# Patient Record
Sex: Female | Born: 1955 | Race: White | Hispanic: No | Marital: Married | Smoking: Former smoker
Health system: Southern US, Community
[De-identification: ages and names within clinical notes are randomized; demographics above are authoritative.]

## PROBLEM LIST (undated history)

## (undated) DIAGNOSIS — F419 Anxiety disorder, unspecified: Secondary | ICD-10-CM

## (undated) DIAGNOSIS — F32A Depression, unspecified: Secondary | ICD-10-CM

## (undated) DIAGNOSIS — E785 Hyperlipidemia, unspecified: Secondary | ICD-10-CM

## (undated) DIAGNOSIS — M1990 Unspecified osteoarthritis, unspecified site: Secondary | ICD-10-CM

## (undated) DIAGNOSIS — G8929 Other chronic pain: Secondary | ICD-10-CM

## (undated) DIAGNOSIS — K449 Diaphragmatic hernia without obstruction or gangrene: Secondary | ICD-10-CM

## (undated) DIAGNOSIS — Z86010 Personal history of colonic polyps: Secondary | ICD-10-CM

## (undated) DIAGNOSIS — K219 Gastro-esophageal reflux disease without esophagitis: Secondary | ICD-10-CM

## (undated) DIAGNOSIS — M51379 Other intervertebral disc degeneration, lumbosacral region without mention of lumbar back pain or lower extremity pain: Secondary | ICD-10-CM

## (undated) DIAGNOSIS — F329 Major depressive disorder, single episode, unspecified: Secondary | ICD-10-CM

## (undated) DIAGNOSIS — G2581 Restless legs syndrome: Secondary | ICD-10-CM

## (undated) DIAGNOSIS — Z860101 Personal history of adenomatous and serrated colon polyps: Secondary | ICD-10-CM

## (undated) DIAGNOSIS — Z87898 Personal history of other specified conditions: Secondary | ICD-10-CM

## (undated) DIAGNOSIS — K439 Ventral hernia without obstruction or gangrene: Secondary | ICD-10-CM

## (undated) DIAGNOSIS — Z972 Presence of dental prosthetic device (complete) (partial): Secondary | ICD-10-CM

## (undated) DIAGNOSIS — M549 Dorsalgia, unspecified: Secondary | ICD-10-CM

## (undated) DIAGNOSIS — K429 Umbilical hernia without obstruction or gangrene: Secondary | ICD-10-CM

## (undated) DIAGNOSIS — Z8781 Personal history of (healed) traumatic fracture: Secondary | ICD-10-CM

## (undated) DIAGNOSIS — M5030 Other cervical disc degeneration, unspecified cervical region: Secondary | ICD-10-CM

## (undated) DIAGNOSIS — Z8719 Personal history of other diseases of the digestive system: Secondary | ICD-10-CM

## (undated) DIAGNOSIS — M5137 Other intervertebral disc degeneration, lumbosacral region: Secondary | ICD-10-CM

## (undated) HISTORY — DX: Major depressive disorder, single episode, unspecified: F32.9

## (undated) HISTORY — DX: Gastro-esophageal reflux disease without esophagitis: K21.9

## (undated) HISTORY — DX: Hyperlipidemia, unspecified: E78.5

## (undated) HISTORY — DX: Anxiety disorder, unspecified: F41.9

## (undated) HISTORY — PX: TONSILLECTOMY AND ADENOIDECTOMY: SUR1326

## (undated) HISTORY — DX: Depression, unspecified: F32.A

## (undated) HISTORY — PX: TUBAL LIGATION: SHX77

## (undated) HISTORY — PX: CATARACT EXTRACTION W/ INTRAOCULAR LENS  IMPLANT, BILATERAL: SHX1307

---

## 2001-08-11 ENCOUNTER — Ambulatory Visit: Admission: RE | Admit: 2001-08-11 | Discharge: 2001-08-11 | Payer: Self-pay | Admitting: General Surgery

## 2001-08-11 ENCOUNTER — Encounter: Payer: Self-pay | Admitting: General Surgery

## 2004-10-02 ENCOUNTER — Ambulatory Visit: Payer: Self-pay | Admitting: Family Medicine

## 2004-10-23 ENCOUNTER — Ambulatory Visit: Payer: Self-pay | Admitting: Family Medicine

## 2005-01-20 ENCOUNTER — Ambulatory Visit: Payer: Self-pay | Admitting: Family Medicine

## 2005-02-09 ENCOUNTER — Ambulatory Visit (HOSPITAL_COMMUNITY): Admission: RE | Admit: 2005-02-09 | Discharge: 2005-02-09 | Payer: Self-pay | Admitting: Orthopedic Surgery

## 2005-02-09 ENCOUNTER — Ambulatory Visit (HOSPITAL_BASED_OUTPATIENT_CLINIC_OR_DEPARTMENT_OTHER): Admission: RE | Admit: 2005-02-09 | Discharge: 2005-02-09 | Payer: Self-pay | Admitting: Orthopedic Surgery

## 2005-02-09 HISTORY — PX: OTHER SURGICAL HISTORY: SHX169

## 2005-02-20 ENCOUNTER — Ambulatory Visit: Payer: Self-pay | Admitting: Family Medicine

## 2005-03-30 ENCOUNTER — Ambulatory Visit: Payer: Self-pay | Admitting: Family Medicine

## 2005-05-01 ENCOUNTER — Ambulatory Visit: Payer: Self-pay | Admitting: Family Medicine

## 2008-04-24 ENCOUNTER — Ambulatory Visit: Payer: Self-pay | Admitting: Gastroenterology

## 2008-05-08 ENCOUNTER — Ambulatory Visit: Payer: Self-pay | Admitting: Gastroenterology

## 2008-05-08 ENCOUNTER — Encounter: Payer: Self-pay | Admitting: Gastroenterology

## 2008-05-08 HISTORY — PX: COLONOSCOPY: SHX174

## 2008-05-09 ENCOUNTER — Encounter: Payer: Self-pay | Admitting: Gastroenterology

## 2009-11-16 HISTORY — PX: CARPAL TUNNEL RELEASE: SHX101

## 2010-05-05 ENCOUNTER — Telehealth (INDEPENDENT_AMBULATORY_CARE_PROVIDER_SITE_OTHER): Payer: Self-pay | Admitting: *Deleted

## 2010-05-06 ENCOUNTER — Ambulatory Visit: Payer: Self-pay | Admitting: Internal Medicine

## 2010-05-06 ENCOUNTER — Encounter: Payer: Self-pay | Admitting: Internal Medicine

## 2010-05-06 ENCOUNTER — Encounter (HOSPITAL_COMMUNITY): Admission: RE | Admit: 2010-05-06 | Discharge: 2010-07-23 | Payer: Self-pay | Admitting: Family Medicine

## 2010-05-06 ENCOUNTER — Ambulatory Visit: Payer: Self-pay

## 2010-05-06 HISTORY — PX: CARDIOVASCULAR STRESS TEST: SHX262

## 2010-11-16 HISTORY — PX: LAPAROSCOPIC CHOLECYSTECTOMY: SUR755

## 2010-12-16 NOTE — Progress Notes (Signed)
Summary: Nuclear Pre-Procedure  Phone Note Outgoing Call Call back at [facility] Phone [phone]   Call placed by: Eliezer Lofts, EMT-P,  May 05, 2010 4:14 PM Call placed to: Patient Action Taken: Phone Call Completed Summary of Call: Reviewed information on Myoview Information Sheet (see scanned document for further details).  Spoke with Patient.    Nuclear Med Background Indications for Stress Test: Evaluation for Ischemia, Surgical Clearance        Nuclear Pre-Procedure Cardiac Risk Factors: History of Smoking

## 2010-12-16 NOTE — Miscellaneous (Signed)
Summary: gi previsit  Clinical Lists Changes  Medications: Added new medication of MOVIPREP 100 GM  SOLR (PEG-KCL-NACL-NASULF-NA ASC-C) As per prep instructions. - Signed Rx of MOVIPREP 100 GM  SOLR (PEG-KCL-NACL-NASULF-NA ASC-C) As per prep instructions.;  #1 x 0;  Signed;  Entered by: Trilby Leaver RN;  Authorized by: Ladene Artist MD [facility];  Method used: Electronic Observations: Added new observation of ALLERGY REV: Done (04/24/2008 10:03) Added new observation of NKA: T (04/24/2008 10:03)    Prescriptions: MOVIPREP 100 GM  SOLR (PEG-KCL-NACL-NASULF-NA ASC-C) As per prep instructions.  #1 x 0   Entered by:   Trilby Leaver RN   Authorized by:   Ladene Artist MD [facility]   Signed by:   Trilby Leaver RN on 04/24/2008   Method used:   Electronically sent to ...       [facility]  [address] [place], [place]  [address]       Ph: [phone]       Fax: [phone]   RxID:   [id number]

## 2010-12-16 NOTE — Procedures (Signed)
Summary: Colonoscopy   Colonoscopy  Procedure date:  05/08/2008  Findings:      Location:  [place].    Procedures Next Due Date:    Colonoscopy: 05/2013  Patient Name: Lori Cooper, Lori Cooper MRN:  Procedure Procedures: Colonoscopy CPT: H7044205.    with polypectomy. CPT: S2983155.  Personnel: Endoscopist: Pricilla Riffle. Fuller Plan, MD, Marval Regal.  Referred By: Haynes Hoehn, PA.  Exam Location: Exam performed in Outpatient Clinic. Outpatient  Patient Consent: Procedure, Alternatives, Risks and Benefits discussed, consent obtained, from patient. Consent was obtained by the RN.  Indications  Average Risk Screening Routine.  History  Current Medications: Patient is not currently taking Coumadin.  Pre-Exam Physical: Performed May 08, 2008. Cardio-pulmonary exam, Rectal exam, HEENT exam , Abdominal exam, Mental status exam WNL.  Comments: Pt. history reviewed/updated, physical exam performed prior to initiation of sedation?Yes Exam Exam: Extent of exam reached: Cecum, extent intended: Cecum.  The cecum was identified by appendiceal orifice and IC valve. Time to Cecum: 00:03: 23. Time for Withdrawl: 00:12:06. Colon retroflexion performed. ASA Classification: II. Tolerance: excellent.  Monitoring: Pulse and BP monitoring, Oximetry used. Supplemental O2 given.  Colon Prep Used MoviPrep for colon prep. Prep results: excellent.  Sedation Meds: Patient assessed and found to be appropriate for moderate (conscious) sedation. Fentanyl 75 mcg. given IV. Versed 8 mg. given IV.  Findings POLYP: Descending Colon, Maximum size: 4 mm. sessile polyp. Procedure:  snare without cautery, removed, retrieved, Polyp sent to pathology. ICD9: Colon Polyps: 211.3.  NORMAL EXAM: Cecum to Splenic Flexure.  NORMAL EXAM: Sigmoid Colon.  HEMORRHOIDS: Internal. Size: Small. Not bleeding. Not thrombosed. ICD9: Hemorrhoids, Internal: 455.0.   Assessment  Diagnoses: 211.3: Colon Polyps.  455.0:  Hemorrhoids, Internal.   Events  Unplanned Interventions: No intervention was required.  Unplanned Events: There were no complications. Plans  Post Exam Instructions: Post sedation instructions given.  Medication Plan: Await pathology.  Patient Education: Patient given standard instructions for: Polyps. Hemorrhoids.  Disposition: After procedure patient sent to recovery. After recovery patient sent home.  Scheduling/Referral: Colonoscopy, to [facility] T. Fuller Plan, MD, [facility], if polyp is adenomatous, around May 08, 2013.  Colonoscopy, to [facility] T. Fuller Plan, MD, [facility], if polyp is not neoplastic, around May 08, 2018.     cc.   Chase Picket    REPORT OF SURGICAL PATHOLOGY   Case #: [id number] Patient Name: Lori Cooper, Lori Cooper. Office Chart Number:  [id number]   MRN: [id number] Pathologist: Jaquelyn Bitter A. Rodney Cruise, MD DOB/Age  07/24/1956 (Age: 55)    Gender: F Date Taken:  05/08/2008 Date Received: 05/08/2008   FINAL DIAGNOSIS   ***MICROSCOPIC EXAMINATION AND DIAGNOSIS***   DESCENDING COLON, POLYP:  TUBULAR ADENOMA.  NO HIGH GRADE DYSPLASIA OR MALIGNANCY IDENTIFIED.   mj Date Reported:  05/09/2008     Louanne Belton. Rodney Cruise, MD *** Electronically Signed Out By EAA ***   Clinical information Screening R/O adenoma (jes)    specimen(s) obtained Colon, polyp(s), descending   Gross Description Received in formalin is a tan, soft tissue fragment that is submitted in toto.  Size:  0.4 cm One block  (ML:jes,05/09/08)    jes/     Signed by Ladene Artist MD FACG on 05/09/2008 at 3:45 PM  ________________________________________________________________________ colon 04/2013   Signed by Ladene Artist MD FACG on 05/09/2008 at 3:45 PM      May 09, 2008 MRN: [id number]    [facility] [place], [place]  [id number]    Dear Lori Cooper,  I am  pleased to inform you that the colon polyp(s) removed during your recent colonoscopy was (were) found to be benign (no  cancer detected) upon pathologic examination.  I recommend you have a repeat colonoscopy examination in 5 years to look for recurrent polyps, as having colon polyps increases your risk for having recurrent polyps or even colon cancer in the future.  Should you develop new or worsening symptoms of abdominal pain, bowel habit changes or bleeding from the rectum or bowels, please schedule an evaluation with either your primary care physician or with me.  Continue treatment plan as outlined the day of your exam.  Please call us if you are having persistent problems or have questions about your condition that have not been fully answered at this time.  Sincerely,  Ladene Artist MD [facility]  This letter has been electronically signed by your physician.   Signed by Ladene Artist MD FACG on 05/09/2008 at 3:44 PM    This report was created from the original endoscopy report, which was reviewed and signed by the above listed endoscopist.

## 2010-12-16 NOTE — Assessment & Plan Note (Signed)
Summary: Cardiology Nuclear Study  Nuclear Med Background Indications for Stress Test: Evaluation for Ischemia, Surgical Clearance  Indications Comments: Pending Gall Bladder surgery   History Comments: NO DOCUMENTED CAD.  PATIENT IN [facility] ED, 12/10 WITH CP, NO RECORDS.  Symptoms: Chest Pain, Diaphoresis, Fatigue  Symptoms Comments: Last episode of CP:1 month ago.   Nuclear Pre-Procedure Cardiac Risk Factors: Lipids, Obesity, Smoker Caffeine/Decaff Intake: none NPO After: 8:00 PM Lungs: Clear.  O2 Sat 95% on RA. IV 0.9% NS with Angio Cath: 20g     IV Site: (R) AC IV Started by: Eliezer Lofts EMT-P Chest Size (in) 40     Cup Size C     Height (in): 65 Weight (lb): 205 BMI: 34.24  Nuclear Med Study 1 or 2 day study:  1 day     Stress Test Type:  Carlton Adam Reading MD:  Glori Bickers, MD     Referring MD:  Redge Gainer, MD Resting Radionuclide:  Technetium 54m Tetrofosmin     Resting Radionuclide Dose:  11 mCi  Stress Radionuclide:  Technetium 19m Tetrofosmin     Stress Radionuclide Dose:  33 mCi   Stress Protocol   Lexiscan: 0.4 mg   Stress Test Technologist:  Valetta Fuller CMA-N     Nuclear Technologist:  Charlton Amor CNMT  Rest Procedure  Myocardial perfusion imaging was performed at rest 45 minutes following the intravenous administration of Myoview Technetium 69m Tetrofosmin.  Stress Procedure  The patient received IV Lexiscan 0.4 mg over 15-seconds.  Myoview injected at 30-seconds.  There were no significant changes with infusion.  Quantitative spect images were obtained after a 45 minute delay.  QPS Raw Data Images:  There is a breast shadow that accounts for the anterior attenuation. Stress Images:  Decreased uptake over the mid to distal anterior wall Rest Images:  Normal homogeneous uptake in all areas of the myocardium. Subtraction (SDS):  There is a large reversible defect in the mid to distal anterior wall. Based on the raw images  this seems to be shifting breast attenuation however cannot exclude ischemia Transient Ischemic Dilatation:  1.05  (Normal <1.22)  Lung/Heart Ratio:  .13  (Normal <0.45)  Quantitative Gated Spect Images QGS EDV:  106 ml QGS ESV:  35 ml QGS EF:  67 % QGS cine images:  Normal  Findings Probably normal      Overall Impression  Exercise Capacity: Baxter study with no exercise. ECG Impression: Baseline: NSR; No significant ST segment change with Lexiscan. Overall Impression: Probably normal Lexiscan Myoview. Overall Impression Comments: There is a large reversible defect in the mid to distal anterior wall. Based on the raw images this seems to be shifting breast attenuation however cannot exclude ischemia.

## 2011-04-03 NOTE — Op Note (Signed)
NAMEZAHARRA, MADRIAGA                ACCOUNT NO.:  [id number]   MEDICAL RECORD NO.:  [id number]          PATIENT TYPE:  AMB   LOCATION:  [place]                          FACILITY:  [place]   PHYSICIAN:  Kathalene Frames. Mayer Camel, M.D.   DATE OF BIRTH:  [birth year]   DATE OF PROCEDURE:  DATE OF DISCHARGE:                                 OPERATIVE REPORT   No dictation for this job number.  Dictator cancelled.      FJR/MEDQ  D:  02/09/2005  T:  02/09/2005  Job:  [id number]

## 2011-04-07 NOTE — Op Note (Signed)
NAMEMACKINZE, KOPEC                ACCOUNT NO.:  [id number]   MEDICAL RECORD NO.:  [id number]          PATIENT TYPE:  AMB   LOCATION:  [place]                          FACILITY:  [place]   PHYSICIAN:  Kathalene Frames. Mayer Camel, M.D.   DATE OF BIRTH:  Aug 07, 1956   DATE OF PROCEDURE:  02/09/2005  DATE OF DISCHARGE:                                 OPERATIVE REPORT   PREOPERATIVE DIAGNOSIS:  Left shoulder impingement syndrome, AC joint  arthritis.   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   PROCEDURE:  Left shoulder arthroscopic anterior inferior acromioplasty,  distal clavicle excision, complete, and debridement of superior labral tear,  degenerative.   SURGEON:  Kathalene Frames. Mayer Camel, M.D.   FIRST ASSISTANT:  None.   ANESTHETIC:  Interscalene block, left and general endotracheal.   ESTIMATED BLOOD LOSS:  Minimal.   FLUID REPLACEMENT:  Liter crystalloid.   DRAINS PLACED:  None.   TOURNIQUET TIME:  None.   INDICATIONS FOR PROCEDURE:  55 year old woman who slipped and fell, I  believe back in August of 2005, was treated appropriately and conservatively  for grade one to two [facility] separation by Dr. Rip Harbour with anti-inflammatory  medicines, physical therapy good temporary relief from cortisone shot. She  also has some impingement, and unfortunately, these maneuvers have failed.  She does have a very sharp type 2 to 3 subacromial spur 1 cm in length and  because of this, is taken for arthroscopic evaluation and treatment of her  left shoulder. Risks, benefits of surgery discussed at length with the  patient.   DESCRIPTION OF PROCEDURE:  The patient identified by armband, taken the  operating room, at [facility] day surgery center. Appropriate site monitors were  attached and interscalene block anesthesia induced to the left upper  extremity followed by general endotracheal anesthesia. She was placed in the  beach chair position, left  upper extremity prepped and draped in the sterile  fashion from the wrist to the hemithorax and using a #11 blade, standard  portals were then made 1.5 cm anterior to the [facility] joint, lateral to the  junction of the middle and posterior thirds acromion, posterior to the  posterolateral corner of the acromion process. The inflow was placed  anteriorly. The arthroscope laterally and a 4.2 great white sucker shaver  posteriorly allowing removal of the subacromial bursa as an assistant  applied downward traction on the elbow.  This outlined the subacromial spur  which was quite impressive and then removed with a 4.5 hooded vortex bur  exposing the [facility] joint which was down to bare bone arthritis. We then set  about removing the distal clavicle, removing the inferior centimeter  bringing the scope in laterally and the bur in posteriorly and then swapping  portals with the scope posterior, the inflow lateral and the bur anterior,  completing the distal clavicle excision.  Satisfied with this, the  arthroscope was then repositioned into the glenohumeral joint which had a  fairly normal appearance except for some degenerative tearing of the  superior labrum which was  removed using the anterior portal and the 4.2  great white sucker shaver. After this had been accomplished, the shoulder  was thoroughly irrigated out with normal saline solution. The arthroscopic  consents removed and a dressing of Xeroform, 4x4 dressing sponges, ABD and  paper tape and a sling applied. The patient was laid supine, awakened and  taken to the recovery room without difficulty.      FJR/MEDQ  D:  02/09/2005  T:  02/09/2005  Job:  [id number]

## 2012-12-15 ENCOUNTER — Other Ambulatory Visit: Payer: Self-pay | Admitting: Sports Medicine

## 2012-12-15 DIAGNOSIS — M25512 Pain in left shoulder: Secondary | ICD-10-CM

## 2012-12-16 ENCOUNTER — Other Ambulatory Visit: Payer: Self-pay | Admitting: Sports Medicine

## 2012-12-16 ENCOUNTER — Ambulatory Visit
Admission: RE | Admit: 2012-12-16 | Discharge: 2012-12-16 | Disposition: A | Discharge status: Home / Self Care | Payer: BC Managed Care – PPO | Source: Ambulatory Visit | Attending: Sports Medicine | Admitting: Sports Medicine

## 2012-12-16 DIAGNOSIS — M25512 Pain in left shoulder: Secondary | ICD-10-CM

## 2013-01-31 ENCOUNTER — Ambulatory Visit: Payer: BC Managed Care – PPO | Admitting: Physical Therapy

## 2013-02-06 ENCOUNTER — Ambulatory Visit: Payer: BC Managed Care – PPO | Attending: Orthopedic Surgery | Admitting: Physical Therapy

## 2013-02-06 DIAGNOSIS — R5381 Other malaise: Secondary | ICD-10-CM | POA: Insufficient documentation

## 2013-02-06 DIAGNOSIS — M25619 Stiffness of unspecified shoulder, not elsewhere classified: Secondary | ICD-10-CM | POA: Insufficient documentation

## 2013-02-06 DIAGNOSIS — IMO0001 Reserved for inherently not codable concepts without codable children: Secondary | ICD-10-CM | POA: Insufficient documentation

## 2013-02-06 DIAGNOSIS — M25519 Pain in unspecified shoulder: Secondary | ICD-10-CM | POA: Insufficient documentation

## 2013-02-09 ENCOUNTER — Ambulatory Visit: Payer: BC Managed Care – PPO | Admitting: Physical Therapy

## 2013-02-10 ENCOUNTER — Other Ambulatory Visit (INDEPENDENT_AMBULATORY_CARE_PROVIDER_SITE_OTHER): Payer: BC Managed Care – PPO

## 2013-02-10 DIAGNOSIS — E559 Vitamin D deficiency, unspecified: Secondary | ICD-10-CM

## 2013-02-10 DIAGNOSIS — I10 Essential (primary) hypertension: Secondary | ICD-10-CM

## 2013-02-10 DIAGNOSIS — E785 Hyperlipidemia, unspecified: Secondary | ICD-10-CM

## 2013-02-10 LAB — BASIC METABOLIC PANEL
BUN: 9 mg/dL (ref 6–23)
CO2: 22 mEq/L (ref 19–32)
Calcium: 9.7 mg/dL (ref 8.4–10.5)
Chloride: 104 mEq/L (ref 96–112)
Creat: 0.7 mg/dL (ref 0.50–1.10)
Glucose, Bld: 92 mg/dL (ref 70–99)
Potassium: 4.5 mEq/L (ref 3.5–5.3)
Sodium: 139 mEq/L (ref 135–145)

## 2013-02-10 LAB — HEPATIC FUNCTION PANEL
ALT: 16 U/L (ref 0–35)
AST: 19 U/L (ref 0–37)
Albumin: 4.2 g/dL (ref 3.5–5.2)
Alkaline Phosphatase: 76 U/L (ref 39–117)
Bilirubin, Direct: <0.1 mg/dL (ref 0.0–0.3)
Total Bilirubin: 0.4 mg/dL (ref 0.3–1.2)
Total Protein: 6.3 g/dL (ref 6.0–8.3)

## 2013-02-10 NOTE — Progress Notes (Signed)
Patient came in for labs only.

## 2013-02-11 LAB — VITAMIN D 25 HYDROXY (VIT D DEFICIENCY, FRACTURES): Vit D, 25-Hydroxy: 24 ng/mL — ABNORMAL LOW (ref 30–89)

## 2013-02-13 ENCOUNTER — Ambulatory Visit: Payer: BC Managed Care – PPO | Admitting: Physical Therapy

## 2013-02-14 LAB — NMR LIPOPROFILE WITH LIPIDS
Cholesterol, Total: 278 mg/dL — ABNORMAL HIGH (ref <200)
HDL Particle Number: 40.5 umol/L (ref >=30.5)
HDL Size: 8.5 nm — ABNORMAL LOW (ref >=9.2)
HDL-C: 54 mg/dL (ref >=40)
LDL (calc): 197 mg/dL — ABNORMAL HIGH (ref <100)
LDL Particle Number: 2858 nmol/L — ABNORMAL HIGH (ref <1000)
LDL Size: 21.2 nm (ref >20.5)
LP-IR Score: 52 — ABNORMAL HIGH (ref <=45)
Large HDL-P: 2.4 umol/L — ABNORMAL LOW (ref >=4.8)
Large VLDL-P: <0.8 nmol/L (ref <=2.7)
Small LDL Particle Number: 1259 nmol/L — ABNORMAL HIGH (ref <=527)
Triglycerides: 134 mg/dL (ref <150)
VLDL Size: 46.1 nm (ref >=46.6)

## 2013-02-14 NOTE — Progress Notes (Signed)
Quick Note:  Labs abnormal. Vitamin D is low. LDL particles very high cholesterol abnormal. Needs to be seen to discuss lab results, appointment to be made if not already made.  ______

## 2013-02-15 ENCOUNTER — Ambulatory Visit: Payer: BC Managed Care – PPO | Attending: Orthopedic Surgery | Admitting: Physical Therapy

## 2013-02-15 DIAGNOSIS — R5381 Other malaise: Secondary | ICD-10-CM | POA: Insufficient documentation

## 2013-02-15 DIAGNOSIS — M25519 Pain in unspecified shoulder: Secondary | ICD-10-CM | POA: Insufficient documentation

## 2013-02-15 DIAGNOSIS — IMO0001 Reserved for inherently not codable concepts without codable children: Secondary | ICD-10-CM | POA: Insufficient documentation

## 2013-02-15 DIAGNOSIS — M25619 Stiffness of unspecified shoulder, not elsewhere classified: Secondary | ICD-10-CM | POA: Insufficient documentation

## 2013-02-17 ENCOUNTER — Ambulatory Visit: Payer: BC Managed Care – PPO | Admitting: Physical Therapy

## 2013-02-20 ENCOUNTER — Ambulatory Visit: Payer: BC Managed Care – PPO | Admitting: Physical Therapy

## 2013-02-20 ENCOUNTER — Encounter: Payer: Self-pay | Admitting: Family Medicine

## 2013-02-20 ENCOUNTER — Ambulatory Visit (INDEPENDENT_AMBULATORY_CARE_PROVIDER_SITE_OTHER): Payer: BC Managed Care – PPO | Admitting: Family Medicine

## 2013-02-20 VITALS — BP 154/76 | HR 73 | Temp 97.3°F | Ht 65.0 in | Wt 208.0 lb

## 2013-02-20 DIAGNOSIS — S4290XA Fracture of unspecified shoulder girdle, part unspecified, initial encounter for closed fracture: Secondary | ICD-10-CM | POA: Insufficient documentation

## 2013-02-20 DIAGNOSIS — IMO0001 Reserved for inherently not codable concepts without codable children: Secondary | ICD-10-CM | POA: Insufficient documentation

## 2013-02-20 DIAGNOSIS — G2581 Restless legs syndrome: Secondary | ICD-10-CM

## 2013-02-20 DIAGNOSIS — K219 Gastro-esophageal reflux disease without esophagitis: Secondary | ICD-10-CM

## 2013-02-20 DIAGNOSIS — S4292XS Fracture of left shoulder girdle, part unspecified, sequela: Secondary | ICD-10-CM

## 2013-02-20 DIAGNOSIS — S42309S Unspecified fracture of shaft of humerus, unspecified arm, sequela: Secondary | ICD-10-CM

## 2013-02-20 DIAGNOSIS — R030 Elevated blood-pressure reading, without diagnosis of hypertension: Secondary | ICD-10-CM

## 2013-02-20 DIAGNOSIS — E785 Hyperlipidemia, unspecified: Secondary | ICD-10-CM | POA: Insufficient documentation

## 2013-02-20 DIAGNOSIS — F411 Generalized anxiety disorder: Secondary | ICD-10-CM

## 2013-02-20 LAB — FERRITIN: Ferritin: 27 ng/mL (ref 10–291)

## 2013-02-20 MED ORDER — CHOLECALCIFEROL 50 MCG (2000 UT) PO TABS: 6000.0000 [IU] | ORAL_TABLET | Freq: Every day | ORAL | Status: DC

## 2013-02-20 MED ORDER — FENOFIBRATE 54 MG PO TABS: 54.0000 mg | ORAL_TABLET | Freq: Every day | ORAL | Status: DC

## 2013-02-20 NOTE — Assessment & Plan Note (Signed)
This is new and needs to be  Evaluated with a ferritin level.

## 2013-02-20 NOTE — Assessment & Plan Note (Signed)
On prozac.   ?

## 2013-02-20 NOTE — Assessment & Plan Note (Signed)
stable °

## 2013-02-20 NOTE — Progress Notes (Signed)
Patient ID: MILDRETH SCHENCK, female   DOB: 07/15/1956, 57 y.o.   MRN: [id number] SUBJECTIVE:  HPI: Patient is here for follow up of hyperlipidemia: deniesHeadache;deniesChest Pain;deniesweakness;deniesShortness of Breath and orthopnea;deniesVisual changes;deniespalpitations;deniescough;deniespedal edema;deniessymptoms of TIA or stroke;deniesClaudication symptoms. admits toCompliance with medications; problems with medications.: statins causes too much GI upset  In the interim since being seen, patient sustained an injury to her shoulder with resultant fracture of the shoulder. This is being treated by the orthopedist.  Also, experiencing restless legs for which she was prescribed meds , flexeril.  GERd stable.  Anxiety: unchanged.   PMH/PSH: reviewed/updated in Epic  SH/FH: reviewed/updated in Epic  Allergies: reviewed/updated in Epic  Medications: reviewed/updated in Epic  Immunizations: reviewed/updated in Epic   ROS: As above in the HPI. All other systems are stable or negative.   OBJECTIVE:  APPEARANCE:   Obese WF, NAD Patient in no acute distress.The patient appeared well nourished and normally developed. Acyanotic.  Waist:  VITAL SIGNS:BP 154/76  Pulse 73  Temp(Src) 97.3 F (36.3 C) (Oral)  Ht 5\' 5"  (1.651 m)  Wt 208 lb (94.348 kg)  BMI 34.61 kg/m2   SKIN: warm and  Dry without overt rashes, tattoos and scars  HEAD and Neck: without JVD, Normal No scleral icterus  CHEST & LUNGS: Clear  CVS: Reveals the PMI to be normally located. Regular rhythm, First and Second Heart sounds are normal, and absence of murmurs, rubs or gallops.  ABDOMEN:  Benign,, no organomegaly, no masses, no Abdominal Aortic enlargement. No Guarding , no rebound. No Bruits.  RECTAL:n/a  GU:n/a  EXTREMETIES: nonedematous. Both Femoral and Pedal pulses are normal. Left shoulder has , decreased ROM and patient protects.  MUSCULOSKELETAL:  Spine: normal  NEUROLOGIC: oriented  to time,place and person; nonfocal. Strength is normal Sensory is normal Reflexes are normal Cranial Nerves are normal.   ASSESSMENT: Elevated blood pressure Monitor bp daily and bring results in 2 months  Restless legs This is new and needs to be  Evaluated with a ferritin level.  Generalized anxiety disorder On prozac.  GERD (gastroesophageal reflux disease) stable  HLD (hyperlipidemia) Patient reluctant to be on statinsdue to adverse events of GI upset with statins in the past. Will use fenfibrate with Zetia.  Fracture, shoulder prsently in PT and under Ortho supervision.   Vitamin D Deficiency: need to increase the po intake of Vit D supplement.   PLAN:    Diet and Exercise discussed with patient. For nutrition information, I recommend books: Eat to Live by Dr Excell Seltzer. Prevent and Reverse Heart Disease by Dr Karl Luke.  Exercise recommendations are:  If unable to walk, then the patient can exercise in a chair 3 times a day. By flapping arms like a bird gently and raising legs outwards to the front.  If ambulatory, the patient can go for walks for 30 minutes 3 times a week. Then increase the intensity and duration as tolerated. Goal is to try to attain exercise frequency to 5 times a week. Best to perform resistance exercises 2 days a week and cardio type exercises 3 days per week.  Orders Placed This Encounter  Procedures  . Ferritin   Reviewed the labs performed recently.  Discussed treatment and intervention with patient. Added fenfibrate to her regimen and increased the Vit D.                Meds ordered this encounter  Medications  . cyclobenzaprine (FLEXERIL) 10 MG tablet    Sig:   .  ZETIA 10 MG tablet    Sig:   . FLUoxetine (PROZAC) 40 MG capsule    Sig: 80 mg daily.   Marland Kitchen omeprazole (PRILOSEC) 20 MG capsule    Sig:   . LORazepam (ATIVAN) 0.5 MG tablet    Sig: Take 0.5 mg by mouth every 8 (eight) hours as needed for anxiety.  Marland Kitchen  HYDROcodone-acetaminophen (NORCO) 10-325 MG per tablet    Sig: Take 1 tablet by mouth every 8 (eight) hours as needed for pain.  Marland Kitchen DISCONTD: Cholecalciferol (VITAMIN D) 2000 UNITS CAPS    Sig: Take 4,000 Units by mouth every morning.  . fenofibrate 54 MG tablet    Sig: Take 1 tablet (54 mg total) by mouth daily.    Dispense:  30 tablet    Refill:  5  . Cholecalciferol 2000 UNITS TABS    Sig: Take 3 tablets (6,000 Units total) by mouth daily.    Dispense:  30 each    Refill:  11     await the ferritin. RTC x 2 months to check the Vit D level. And to follow up on response to fenofibrate. Recheck the NMR lipomed in 3 months.  Oneika Simonian P. Jacelyn Grip, M.D.

## 2013-02-20 NOTE — Assessment & Plan Note (Signed)
Monitor bp daily and bring results in 2 months

## 2013-02-20 NOTE — Assessment & Plan Note (Signed)
prsently in PT and under Ortho supervision.

## 2013-02-20 NOTE — Patient Instructions (Signed)
Diet and Exercise discussed with patient. For nutrition information, I recommend books: Eat to Live by Dr Excell Seltzer. Prevent and Reverse Heart Disease by Dr Karl Luke.  Exercise recommendations are:  If unable to walk, then the patient can exercise in a chair 3 times a day. By flapping arms like a bird gently and raising legs outwards to the front.  If ambulatory, the patient can go for walks for 30 minutes 3 times a week. Then increase the intensity and duration as tolerated. Goal is to try to attain exercise frequency to 5 times a week. Best to perform resistance exercises 2 days a week and cardio type exercises 3 days per week. Hypertriglyceridemia  Diet for High blood levels of Triglycerides Most fats in food are triglycerides. Triglycerides in your blood are stored as fat in your body. High levels of triglycerides in your blood may put you at a greater risk for heart disease and stroke.  Normal triglyceride levels are less than 150 mg/dL. Borderline high levels are 150-199 mg/dl. High levels are 200 - 499 mg/dL, and very high triglyceride levels are greater than 500 mg/dL. The decision to treat high triglycerides is generally based on the level. For people with borderline or high triglyceride levels, treatment includes weight loss and exercise. Drugs are recommended for people with very high triglyceride levels. Many people who need treatment for high triglyceride levels have metabolic syndrome. This syndrome is a collection of disorders that often include: insulin resistance, high blood pressure, blood clotting problems, high cholesterol and triglycerides. TESTING PROCEDURE FOR TRIGLYCERIDES  You should not eat 4 hours before getting your triglycerides measured. The normal range of triglycerides is between 10 and 250 milligrams per deciliter (mg/dl). Some people may have extreme levels (1000 or above), but your triglyceride level may be too high if it is above 150 mg/dl, depending  on what other risk factors you have for heart disease.  People with high blood triglycerides may also have high blood cholesterol levels. If you have high blood cholesterol as well as high blood triglycerides, your risk for heart disease is probably greater than if you only had high triglycerides. High blood cholesterol is one of the main risk factors for heart disease. CHANGING YOUR DIET  Your weight can affect your blood triglyceride level. If you are more than 20% above your ideal body weight, you may be able to lower your blood triglycerides by losing weight. Eating less and exercising regularly is the best way to combat this. Fat provides more calories than any other food. The best way to lose weight is to eat less fat. Only 30% of your total calories should come from fat. Less than 7% of your diet should come from saturated fat. A diet low in fat and saturated fat is the same as a diet to decrease blood cholesterol. By eating a diet lower in fat, you may lose weight, lower your blood cholesterol, and lower your blood triglyceride level.  Eating a diet low in fat, especially saturated fat, may also help you lower your blood triglyceride level. Ask your dietitian to help you figure how much fat you can eat based on the number of calories your caregiver has prescribed for you.  Exercise, in addition to helping with weight loss may also help lower triglyceride levels.   Alcohol can increase blood triglycerides. You may need to stop drinking alcoholic beverages.  Too much carbohydrate in your diet may also increase your blood triglycerides. Some complex carbohydrates are necessary  in your diet. These may include bread, rice, potatoes, other starchy vegetables and cereals.  Reduce "simple" carbohydrates. These may include pure sugars, candy, honey, and jelly without losing other nutrients. If you have the kind of high blood triglycerides that is affected by the amount of carbohydrates in your diet, you  will need to eat less sugar and less high-sugar foods. Your caregiver can help you with this.  Adding 2-4 grams of fish oil (EPA+ DHA) may also help lower triglycerides. Speak with your caregiver before adding any supplements to your regimen. Following the Diet  Maintain your ideal weight. Your caregivers can help you with a diet. Generally, eating less food and getting more exercise will help you lose weight. Joining a weight control group may also help. Ask your caregivers for a good weight control group in your area.  Eat low-fat foods instead of high-fat foods. This can help you lose weight too.  These foods are lower in fat. Eat MORE of these:   Dried beans, peas, and lentils.  Egg whites.  Low-fat cottage cheese.  Fish.  Lean cuts of meat, such as round, sirloin, rump, and flank (cut extra fat off meat you fix).  Whole grain breads, cereals and pasta.  Skim and nonfat dry milk.  Low-fat yogurt.  Poultry without the skin.  Cheese made with skim or part-skim milk, such as mozzarella, parmesan, farmers', ricotta, or pot cheese. These are higher fat foods. Eat LESS of these:   Whole milk and foods made from whole milk, such as American, blue, cheddar, monterey jack, and swiss cheese  High-fat meats, such as luncheon meats, sausages, knockwurst, bratwurst, hot dogs, ribs, corned beef, ground pork, and regular ground beef.  Fried foods. Limit saturated fats in your diet. Substituting unsaturated fat for saturated fat may decrease your blood triglyceride level. You will need to read package labels to know which products contain saturated fats.  These foods are high in saturated fat. Eat LESS of these:   Fried pork skins.  Whole milk.  Skin and fat from poultry.  Palm oil.  Butter.  Shortening.  Cream cheese.  Berniece Salines.  Margarines and baked goods made from listed oils.  Vegetable shortenings.  Chitterlings.  Fat from meats.  Coconut oil.  Palm kernel  oil.  Lard.  Cream.  Sour cream.  Fatback.  Coffee whiteners and non-dairy creamers made with these oils.  Cheese made from whole milk. Use unsaturated fats (both polyunsaturated and monounsaturated) moderately. Remember, even though unsaturated fats are better than saturated fats; you still want a diet low in total fat.  These foods are high in unsaturated fat:   Canola oil.  Sunflower oil.  Mayonnaise.  Almonds.  Peanuts.  Pine nuts.  Margarines made with these oils.  Safflower oil.  Olive oil.  Avocados.  Cashews.  Peanut butter.  Sunflower seeds.  Soybean oil.  Peanut oil.  Olives.  Pecans.  Walnuts.  Pumpkin seeds. Avoid sugar and other high-sugar foods. This will decrease carbohydrates without decreasing other nutrients. Sugar in your food goes rapidly to your blood. When there is excess sugar in your blood, your liver may use it to make more triglycerides. Sugar also contains calories without other important nutrients.  Eat LESS of these:   Sugar, brown sugar, powdered sugar, jam, jelly, preserves, honey, syrup, molasses, pies, candy, cakes, cookies, frosting, pastries, colas, soft drinks, punches, fruit drinks, and regular gelatin.  Avoid alcohol. Alcohol, even more than sugar, may increase blood triglycerides. In addition,  alcohol is high in calories and low in nutrients. Ask for sparkling water, or a diet soft drink instead of an alcoholic beverage. Suggestions for planning and preparing meals   Bake, broil, grill or roast meats instead of frying.  Remove fat from meats and skin from poultry before cooking.  Add spices, herbs, lemon juice or vinegar to vegetables instead of salt, rich sauces or gravies.  Use a non-stick skillet without fat or use no-stick sprays.  Cool and refrigerate stews and broth. Then remove the hardened fat floating on the surface before serving.  Refrigerate meat drippings and skim off fat to make low-fat  gravies.  Serve more fish.  Use less butter, margarine and other high-fat spreads on bread or vegetables.  Use skim or reconstituted non-fat dry milk for cooking.  Cook with low-fat cheeses.  Substitute low-fat yogurt or cottage cheese for all or part of the sour cream in recipes for sauces, dips or congealed salads.  Use half yogurt/half mayonnaise in salad recipes.  Substitute evaporated skim milk for cream. Evaporated skim milk or reconstituted non-fat dry milk can be whipped and substituted for whipped cream in certain recipes.  Choose fresh fruits for dessert instead of high-fat foods such as pies or cakes. Fruits are naturally low in fat. When Dining Out   Order low-fat appetizers such as fruit or vegetable juice, pasta with vegetables or tomato sauce.  Select clear, rather than cream soups.  Ask that dressings and gravies be served on the side. Then use less of them.  Order foods that are baked, broiled, poached, steamed, stir-fried, or roasted.  Ask for margarine instead of butter, and use only a small amount.  Drink sparkling water, unsweetened tea or coffee, or diet soft drinks instead of alcohol or other sweet beverages. QUESTIONS AND ANSWERS ABOUT OTHER FATS IN THE BLOOD: SATURATED FAT, TRANS FAT, AND CHOLESTEROL What is trans fat? Trans fat is a type of fat that is formed when vegetable oil is hardened through a process called hydrogenation. This process helps makes foods more solid, gives them shape, and prolongs their shelf life. Trans fats are also called hydrogenated or partially hydrogenated oils.  What do saturated fat, trans fat, and cholesterol in foods have to do with heart disease? Saturated fat, trans fat, and cholesterol in the diet all raise the level of LDL "bad" cholesterol in the blood. The higher the LDL cholesterol, the greater the risk for coronary heart disease (CHD). Saturated fat and trans fat raise LDL similarly.  What foods contain saturated  fat, trans fat, and cholesterol? High amounts of saturated fat are found in animal products, such as fatty cuts of meat, chicken skin, and full-fat dairy products like butter, whole milk, cream, and cheese, and in tropical vegetable oils such as palm, palm kernel, and coconut oil. Trans fat is found in some of the same foods as saturated fat, such as vegetable shortening, some margarines (especially hard or stick margarine), crackers, cookies, baked goods, fried foods, salad dressings, and other processed foods made with partially hydrogenated vegetable oils. Small amounts of trans fat also occur naturally in some animal products, such as milk products, beef, and lamb. Foods high in cholesterol include liver, other organ meats, egg yolks, shrimp, and full-fat dairy products. How can I use the new food label to make heart-healthy food choices? Check the Nutrition Facts panel of the food label. Choose foods lower in saturated fat, trans fat, and cholesterol. For saturated fat and cholesterol, you can also  use the Percent Daily Value (%DV): 5% DV or less is low, and 20% DV or more is high. (There is no %DV for trans fat.) Use the Nutrition Facts panel to choose foods low in saturated fat and cholesterol, and if the trans fat is not listed, read the ingredients and limit products that list shortening or hydrogenated or partially hydrogenated vegetable oil, which tend to be high in trans fat. POINTS TO REMEMBER:   Discuss your risk for heart disease with your caregivers, and take steps to reduce risk factors.  Change your diet. Choose foods that are low in saturated fat, trans fat, and cholesterol.  Add exercise to your daily routine if it is not already being done. Participate in physical activity of moderate intensity, like brisk walking, for at least 30 minutes on most, and preferably all days of the week. No time? Break the 30 minutes into three, 10-minute segments during the day.  Stop smoking. If you do  smoke, contact your caregiver to discuss ways in which they can help you quit.  Do not use street drugs.  Maintain a normal weight.  Maintain a healthy blood pressure.  Keep up with your blood work for checking the fats in your blood as directed by your caregiver. Document Released: 08/20/2004 Document Revised: 05/03/2012 Document Reviewed: 03/18/2009 [facility] Patient Information 2013 [place].

## 2013-02-20 NOTE — Assessment & Plan Note (Signed)
Patient reluctant to be on statinsdue to adverse events of GI upset with statins in the past. Will use fenfibrate with Zetia.

## 2013-02-21 ENCOUNTER — Telehealth: Payer: Self-pay | Admitting: Family Medicine

## 2013-02-21 NOTE — Telephone Encounter (Signed)
Pt.notified

## 2013-02-22 ENCOUNTER — Telehealth: Payer: Self-pay | Admitting: *Deleted

## 2013-02-22 NOTE — Progress Notes (Signed)
Quick Note:  Call patient. Labs normal. "It is low normal. And I will like her to start on the iron tablets over-the-counter once daily, to see if this improves her restless legs. Restless legs syndrome has been associated with low iron stores. We'll see what the level is at her next visit ______

## 2013-02-22 NOTE — Telephone Encounter (Signed)
Message copied by Zannie Cove on Wed Feb 22, 2013  9:56 AM ------      Message from: Vernie Shanks      Created: Wed Feb 22, 2013  9:33 AM       Call patient.      Labs normal. "It is low normal. And I will like her to start on the iron tablets over-the-counter once daily, to see if this improves her restless legs.      Restless legs syndrome has been associated with low iron stores.      We'll see what the level is at her next visit ------

## 2013-02-23 ENCOUNTER — Ambulatory Visit: Payer: BC Managed Care – PPO | Admitting: Physical Therapy

## 2013-02-27 ENCOUNTER — Ambulatory Visit: Payer: BC Managed Care – PPO | Admitting: Physical Therapy

## 2013-03-01 ENCOUNTER — Ambulatory Visit: Payer: BC Managed Care – PPO | Admitting: Physical Therapy

## 2013-03-02 ENCOUNTER — Ambulatory Visit: Payer: Self-pay | Admitting: Nurse Practitioner

## 2013-03-06 ENCOUNTER — Encounter: Payer: BC Managed Care – PPO | Admitting: Physical Therapy

## 2013-03-08 ENCOUNTER — Ambulatory Visit: Payer: BC Managed Care – PPO | Admitting: Physical Therapy

## 2013-03-10 ENCOUNTER — Ambulatory Visit: Payer: BC Managed Care – PPO | Admitting: Physical Therapy

## 2013-03-13 ENCOUNTER — Other Ambulatory Visit: Payer: Self-pay | Admitting: *Deleted

## 2013-03-13 ENCOUNTER — Encounter: Payer: BC Managed Care – PPO | Admitting: Physical Therapy

## 2013-03-13 MED ORDER — LORAZEPAM 0.5 MG PO TABS: 0.5000 mg | ORAL_TABLET | Freq: Three times a day (TID) | ORAL | Status: DC | PRN

## 2013-03-13 NOTE — Telephone Encounter (Signed)
RX called to [facility] vm.

## 2013-03-13 NOTE — Telephone Encounter (Signed)
Okay to refill one month.

## 2013-03-13 NOTE — Telephone Encounter (Signed)
LAST REFILL 01/09/13. LAST OV 3/12/147. PLEASE CALL IN [facility] [place] AND LET PT KNOW

## 2013-03-14 ENCOUNTER — Ambulatory Visit: Payer: BC Managed Care – PPO | Admitting: Physical Therapy

## 2013-03-15 ENCOUNTER — Ambulatory Visit: Payer: BC Managed Care – PPO | Admitting: Physical Therapy

## 2013-03-21 ENCOUNTER — Ambulatory Visit: Payer: BC Managed Care – PPO | Attending: Orthopedic Surgery | Admitting: Physical Therapy

## 2013-03-21 DIAGNOSIS — M25519 Pain in unspecified shoulder: Secondary | ICD-10-CM | POA: Insufficient documentation

## 2013-03-21 DIAGNOSIS — IMO0001 Reserved for inherently not codable concepts without codable children: Secondary | ICD-10-CM | POA: Insufficient documentation

## 2013-03-21 DIAGNOSIS — R5381 Other malaise: Secondary | ICD-10-CM | POA: Insufficient documentation

## 2013-03-21 DIAGNOSIS — M25619 Stiffness of unspecified shoulder, not elsewhere classified: Secondary | ICD-10-CM | POA: Insufficient documentation

## 2013-03-22 ENCOUNTER — Other Ambulatory Visit: Payer: Self-pay | Admitting: Family Medicine

## 2013-03-22 NOTE — Telephone Encounter (Signed)
LAST RF 01/25/13. LAST OV 3/14

## 2013-03-24 ENCOUNTER — Ambulatory Visit: Payer: BC Managed Care – PPO

## 2013-03-28 ENCOUNTER — Telehealth: Payer: Self-pay | Admitting: Family Medicine

## 2013-03-29 ENCOUNTER — Ambulatory Visit: Payer: BC Managed Care – PPO | Admitting: Physical Therapy

## 2013-03-29 MED ORDER — EZETIMIBE 10 MG PO TABS: 10.0000 mg | ORAL_TABLET | Freq: Every day | ORAL | Status: DC

## 2013-03-29 NOTE — Telephone Encounter (Signed)
done

## 2013-03-31 ENCOUNTER — Ambulatory Visit: Payer: BC Managed Care – PPO | Admitting: *Deleted

## 2013-04-04 ENCOUNTER — Encounter: Payer: BC Managed Care – PPO | Admitting: Physical Therapy

## 2013-04-06 ENCOUNTER — Encounter: Payer: BC Managed Care – PPO | Admitting: Physical Therapy

## 2013-04-12 ENCOUNTER — Ambulatory Visit: Payer: BC Managed Care – PPO | Admitting: Physical Therapy

## 2013-04-13 ENCOUNTER — Encounter: Payer: Self-pay | Admitting: Gastroenterology

## 2013-04-13 ENCOUNTER — Other Ambulatory Visit (INDEPENDENT_AMBULATORY_CARE_PROVIDER_SITE_OTHER): Payer: BC Managed Care – PPO

## 2013-04-13 DIAGNOSIS — E559 Vitamin D deficiency, unspecified: Secondary | ICD-10-CM

## 2013-04-13 DIAGNOSIS — G2581 Restless legs syndrome: Secondary | ICD-10-CM

## 2013-04-13 LAB — FERRITIN: Ferritin: 29 ng/mL (ref 10–291)

## 2013-04-13 NOTE — Progress Notes (Signed)
Patient came in for labs only.

## 2013-04-14 ENCOUNTER — Encounter: Payer: BC Managed Care – PPO | Admitting: Physical Therapy

## 2013-04-14 LAB — VITAMIN D 25 HYDROXY (VIT D DEFICIENCY, FRACTURES): Vit D, 25-Hydroxy: 32 ng/mL (ref 30–89)

## 2013-04-17 ENCOUNTER — Ambulatory Visit: Payer: BC Managed Care – PPO | Attending: Orthopedic Surgery | Admitting: Physical Therapy

## 2013-04-17 DIAGNOSIS — IMO0001 Reserved for inherently not codable concepts without codable children: Secondary | ICD-10-CM | POA: Insufficient documentation

## 2013-04-17 DIAGNOSIS — M25619 Stiffness of unspecified shoulder, not elsewhere classified: Secondary | ICD-10-CM | POA: Insufficient documentation

## 2013-04-17 DIAGNOSIS — M25519 Pain in unspecified shoulder: Secondary | ICD-10-CM | POA: Insufficient documentation

## 2013-04-17 DIAGNOSIS — R5381 Other malaise: Secondary | ICD-10-CM | POA: Insufficient documentation

## 2013-04-19 ENCOUNTER — Ambulatory Visit: Payer: BC Managed Care – PPO | Admitting: Physical Therapy

## 2013-04-21 ENCOUNTER — Encounter: Payer: Self-pay | Admitting: Family Medicine

## 2013-04-21 ENCOUNTER — Ambulatory Visit (INDEPENDENT_AMBULATORY_CARE_PROVIDER_SITE_OTHER): Payer: BC Managed Care – PPO | Admitting: Family Medicine

## 2013-04-21 VITALS — BP 143/75 | HR 84 | Temp 98.6°F | Wt 211.2 lb

## 2013-04-21 DIAGNOSIS — E785 Hyperlipidemia, unspecified: Secondary | ICD-10-CM

## 2013-04-21 DIAGNOSIS — F411 Generalized anxiety disorder: Secondary | ICD-10-CM

## 2013-04-21 DIAGNOSIS — K219 Gastro-esophageal reflux disease without esophagitis: Secondary | ICD-10-CM

## 2013-04-21 DIAGNOSIS — R030 Elevated blood-pressure reading, without diagnosis of hypertension: Secondary | ICD-10-CM

## 2013-04-21 DIAGNOSIS — G2581 Restless legs syndrome: Secondary | ICD-10-CM

## 2013-04-21 DIAGNOSIS — IMO0001 Reserved for inherently not codable concepts without codable children: Secondary | ICD-10-CM

## 2013-04-21 MED ORDER — LORAZEPAM 0.5 MG PO TABS: 0.5000 mg | ORAL_TABLET | Freq: Three times a day (TID) | ORAL | Status: DC | PRN

## 2013-04-21 MED ORDER — CYCLOBENZAPRINE HCL 10 MG PO TABS: ORAL_TABLET | ORAL | Status: DC

## 2013-04-21 MED ORDER — FLUOXETINE HCL 40 MG PO CAPS: 80.0000 mg | ORAL_CAPSULE | Freq: Every day | ORAL | Status: DC

## 2013-04-21 NOTE — Patient Instructions (Addendum)
      Dr Phyillis Dascoli's Recommendations  Diet and Exercise discussed with patient.  For nutrition information, I recommend books:  1).Eat to Live by Dr Joel Fuhrman. 2).Prevent and Reverse Heart Disease by Dr Caldwell Esselstyn. 3) Dr Neal Barnard's Book: Reversing Diabetes  Exercise recommendations are:  If unable to walk, then the patient can exercise in a chair 3 times a day. By flapping arms like a bird gently and raising legs outwards to the front.  If ambulatory, the patient can go for walks for 30 minutes 3 times a week. Then increase the intensity and duration as tolerated.  Goal is to try to attain exercise frequency to 5 times a week.  If applicable: Best to perform resistance exercises (machines or weights) 2 days a week and cardio type exercises 3 days per week.  

## 2013-04-21 NOTE — Progress Notes (Signed)
Patient ID: Lori Cooper, female   DOB: 1956-09-20, 57 y.o.   MRN: [id number] SUBJECTIVE: Chief Complaint  Patient presents with  . Follow-up    2 month follow up states she stopped cholesterol meds    HPI: Patient is here for follow up of hyperlipidemia:  denies Headache;denies Chest Pain;denies weakness;denies Shortness of Breath and orthopnea;denies Visual changes;denies palpitations;denies cough;denies pedal edema;denies symptoms of TIA or stroke;deniesClaudication symptoms.  Compliance with medications; had to stop the statin. It made her hurt so bad. Cannot take statins nor zetia  Problems with medications.muscular aches. Restless legs better with being on iron.   PMH/PSH: reviewed/updated in Epic  SH/FH: reviewed/updated in Epic  Allergies: reviewed/updated in Epic  Medications: reviewed/updated in Epic  Immunizations: reviewed/updated in Epic  ROS: As above in the HPI. All other systems are stable or negative.  OBJECTIVE: APPEARANCE:  Patient in no acute distress.The patient appeared well nourished and normally developed. Acyanotic. Waist: VITAL SIGNS:BP 143/75  Pulse 84  Temp(Src) 98.6 F (37 C) (Oral)  Wt 211 lb 3.2 oz (95.8 kg)  BMI 35.15 kg/m2 Obese WF. Stressing about her step sons coming in and out of her house. She doesn't trust them.  SKIN: warm and  Dry without overt rashes, tattoos and scars  HEAD and Neck: without JVD, Head and scalp: normal Eyes:No scleral icterus. Fundi normal, eye movements normal. Ears: Auricle normal, canal normal, Tympanic membranes normal, insufflation normal. Nose: normal Throat: normal Neck & thyroid: normal  CHEST & LUNGS: Chest wall: normal Lungs: Clear  CVS: Reveals the PMI to be normally located. Regular rhythm, First and Second Heart sounds are normal,  absence of murmurs, rubs or gallops. Peripheral vasculature: Radial pulses: normal Dorsal pedis pulses: normal Posterior pulses: normal  ABDOMEN:   Appearance:obese Benign, no organomegaly, no masses, no Abdominal Aortic enlargement. No Guarding , no rebound. No Bruits. Bowel sounds: normal  RECTAL: N/A GU: N/A  EXTREMETIES: nonedematous. Both Femoral and Pedal pulses are normal.  MUSCULOSKELETAL:  Spine: normal Joints: intact  NEUROLOGIC: oriented to time,place and person; nonfocal. Strength is normal Sensory is normal Reflexes are normal Cranial Nerves are normal.  Results for orders placed in visit on 04/13/13  VITAMIN D 25 HYDROXY      Result Value Range   Vit D, 25-Hydroxy 32  30 - 89 ng/mL  FERRITIN      Result Value Range   Ferritin 29  10 - 291 ng/mL    ASSESSMENT: HLD (hyperlipidemia)  Restless legs - Plan: cyclobenzaprine (FLEXERIL) 10 MG tablet  GERD (gastroesophageal reflux disease)  Generalized anxiety disorder - Plan: FLUoxetine (PROZAC) 40 MG capsule, LORazepam (ATIVAN) 0.5 MG tablet  Elevated blood pressure   PLAN: No orders of the defined types were placed in this encounter.   Meds ordered this encounter  Medications  . FLUoxetine (PROZAC) 40 MG capsule    Sig: Take 2 capsules (80 mg total) by mouth daily.    Dispense:  180 capsule    Refill:  1  . LORazepam (ATIVAN) 0.5 MG tablet    Sig: Take 1 tablet (0.5 mg total) by mouth every 8 (eight) hours as needed for anxiety.    Dispense:  40 tablet    Refill:  0  . cyclobenzaprine (FLEXERIL) 10 MG tablet    Sig: TAKE ONE TABLET BY MOUTH ONCE DAILY AS NEEDED MUSCLE  SPASM    Dispense:  30 tablet    Refill:  0   Stress reduction. Discussed treatment options for her  hyperlipidemia. Patient declines due to problems associated with being on cholesterol meds. Mentioned even questran. Patient declines.       Dr Paula Libra Recommendations  Diet and Exercise discussed with patient.  For nutrition information, I recommend books:  1).Eat to Live by Dr Excell Seltzer. 2).Prevent and Reverse Heart Disease by Dr Karl Luke. 3) Dr  Janene Harvey Book: Reversing Diabetes  Exercise recommendations are:  If unable to walk, then the patient can exercise in a chair 3 times a day. By flapping arms like a bird gently and raising legs outwards to the front.  If ambulatory, the patient can go for walks for 30 minutes 3 times a week. Then increase the intensity and duration as tolerated.  Goal is to try to attain exercise frequency to 5 times a week.  If applicable: Best to perform resistance exercises (machines or weights) 2 days a week and cardio type exercises 3 days per week.  Return in about 2 months (around 06/21/2013) for Recheck medical problems.  Lori Cooper P. Jacelyn Grip, M.D.

## 2013-04-26 ENCOUNTER — Ambulatory Visit: Payer: BC Managed Care – PPO | Admitting: Physical Therapy

## 2013-04-28 ENCOUNTER — Ambulatory Visit: Payer: BC Managed Care – PPO | Admitting: Physical Therapy

## 2013-05-03 ENCOUNTER — Ambulatory Visit: Payer: BC Managed Care – PPO | Admitting: Physical Therapy

## 2013-05-04 ENCOUNTER — Encounter: Payer: BC Managed Care – PPO | Admitting: Physical Therapy

## 2013-05-05 ENCOUNTER — Ambulatory Visit: Payer: BC Managed Care – PPO | Admitting: Physical Therapy

## 2013-05-09 ENCOUNTER — Ambulatory Visit: Payer: BC Managed Care – PPO | Admitting: Physical Therapy

## 2013-05-11 ENCOUNTER — Ambulatory Visit: Payer: BC Managed Care – PPO | Admitting: Physical Therapy

## 2013-05-15 ENCOUNTER — Ambulatory Visit: Payer: BC Managed Care – PPO | Admitting: *Deleted

## 2013-05-18 ENCOUNTER — Encounter: Payer: BC Managed Care – PPO | Admitting: Physical Therapy

## 2013-05-22 ENCOUNTER — Other Ambulatory Visit: Payer: Self-pay | Admitting: Family Medicine

## 2013-05-24 NOTE — Telephone Encounter (Signed)
Authorization left on [employer].

## 2013-05-24 NOTE — Telephone Encounter (Signed)
LAST RF 04/22/13. LAST OV 04/21/13. CALL IN [facility]. THANKS.

## 2013-05-24 NOTE — Telephone Encounter (Signed)
Please call in Rx

## 2013-05-25 ENCOUNTER — Ambulatory Visit: Payer: BC Managed Care – PPO | Attending: Orthopedic Surgery | Admitting: Physical Therapy

## 2013-05-25 DIAGNOSIS — M25619 Stiffness of unspecified shoulder, not elsewhere classified: Secondary | ICD-10-CM | POA: Insufficient documentation

## 2013-05-25 DIAGNOSIS — R5381 Other malaise: Secondary | ICD-10-CM | POA: Insufficient documentation

## 2013-05-25 DIAGNOSIS — M25519 Pain in unspecified shoulder: Secondary | ICD-10-CM | POA: Insufficient documentation

## 2013-05-25 DIAGNOSIS — IMO0001 Reserved for inherently not codable concepts without codable children: Secondary | ICD-10-CM | POA: Insufficient documentation

## 2013-06-23 ENCOUNTER — Encounter: Payer: Self-pay | Admitting: Family Medicine

## 2013-06-23 ENCOUNTER — Ambulatory Visit (INDEPENDENT_AMBULATORY_CARE_PROVIDER_SITE_OTHER): Payer: BC Managed Care – PPO | Admitting: Family Medicine

## 2013-06-23 VITALS — BP 135/75 | HR 97 | Temp 97.7°F | Wt 212.8 lb

## 2013-06-23 DIAGNOSIS — E559 Vitamin D deficiency, unspecified: Secondary | ICD-10-CM

## 2013-06-23 DIAGNOSIS — IMO0001 Reserved for inherently not codable concepts without codable children: Secondary | ICD-10-CM

## 2013-06-23 DIAGNOSIS — S4292XS Fracture of left shoulder girdle, part unspecified, sequela: Secondary | ICD-10-CM

## 2013-06-23 DIAGNOSIS — K219 Gastro-esophageal reflux disease without esophagitis: Secondary | ICD-10-CM

## 2013-06-23 DIAGNOSIS — R030 Elevated blood-pressure reading, without diagnosis of hypertension: Secondary | ICD-10-CM

## 2013-06-23 DIAGNOSIS — S42309S Unspecified fracture of shaft of humerus, unspecified arm, sequela: Secondary | ICD-10-CM

## 2013-06-23 DIAGNOSIS — E785 Hyperlipidemia, unspecified: Secondary | ICD-10-CM

## 2013-06-23 DIAGNOSIS — F411 Generalized anxiety disorder: Secondary | ICD-10-CM

## 2013-06-23 DIAGNOSIS — G2581 Restless legs syndrome: Secondary | ICD-10-CM

## 2013-06-23 MED ORDER — CHOLESTYRAMINE 4 G PO PACK: 1.0000 | PACK | Freq: Two times a day (BID) | ORAL | Status: DC

## 2013-06-23 MED ORDER — OMEPRAZOLE 20 MG PO CPDR: 20.0000 mg | DELAYED_RELEASE_CAPSULE | Freq: Every day | ORAL | Status: DC

## 2013-06-23 NOTE — Patient Instructions (Addendum)
      Dr Paula Libra Recommendations  Diet and Exercise discussed with patient.  For nutrition information, I recommend books:  1).Eat to Live by Dr Excell Seltzer. 2).Prevent and Reverse Heart Disease by Dr Karl Luke. 3) Dr Janene Harvey Book:  Program to Reverse Diabetes  Exercise recommendations are:  If unable to walk, then the patient can exercise in a chair 3 times a day. By flapping arms like a bird gently and raising legs outwards to the front.  If ambulatory, the patient can go for walks for 30 minutes 3 times a week. Then increase the intensity and duration as tolerated.  Goal is to try to attain exercise frequency to 5 times a week.  If applicable: Best to perform resistance exercises (machines or weights) 2 days a week and cardio type exercises 3 days per week.

## 2013-06-23 NOTE — Progress Notes (Signed)
Patient ID: Lori Cooper, female   DOB: December 23, 1955, 57 y.o.   MRN: [id number] SUBJECTIVE: CC: Chief Complaint  Patient presents with  . Follow-up    2 month follow up sees dr Nelva Bush follows up on her back and achilles tendon in feet followed by dr hewitt and had shot in hands given by dr Amedeo Plenty.     HPI: Patient is here for follow up of hyperlipidemia: denies Headache;denies Chest Pain;denies weakness;denies Shortness of Breath and orthopnea;denies Visual changes;denies palpitations;denies cough;denies pedal edema;denies symptoms of TIA or stroke;deniesClaudication symptoms. admits to Compliance with medications; denies Problems with medications.   Breakfast: doesn't eat breakfast Lunch Peanut butter sandwich Supper: tomato sandwich.  Lost her Job: post Fracture left humerus. Back problems. Looking at long term disability.  Past Medical History  Diagnosis Date  . Anxiety   . Depression   . GERD (gastroesophageal reflux disease)   . Fracture, shoulder 12/14/2012    left   Past Surgical History  Procedure Laterality Date  . Fracture surgery      left shoulder 2005  . Tubal ligation    . Cholecystectomy    . Carpal tunnel release  2011    bilateral      . Ganglion cyst excision     History   Social History  . Marital Status: Married    Spouse Name: N/A    Number of Children: N/A  . Years of Education: N/A   Occupational History  . diability    Social History Main Topics  . Smoking status: Current Every Day Smoker -- 0.50 packs/day    Types: Cigarettes    Start date: 02/20/1973  . Smokeless tobacco: Not on file  . Alcohol Use: Not on file  . Drug Use: Not on file  . Sexually Active: Not on file   Other Topics Concern  . Not on file   Social History Narrative  . No narrative on file   Family History  Problem Relation Age of Onset  . Hypertension Mother   . Kidney disease Mother   . Diabetes Mother    Current Outpatient Prescriptions on File Prior to  Visit  Medication Sig Dispense Refill  . cyclobenzaprine (FLEXERIL) 10 MG tablet TAKE ONE TABLET BY MOUTH ONCE DAILY AS NEEDED MUSCLE  SPASM  30 tablet  0  . FLUoxetine (PROZAC) 40 MG capsule Take 2 capsules (80 mg total) by mouth daily.  180 capsule  1  . HYDROcodone-acetaminophen (NORCO) 10-325 MG per tablet Take 1 tablet by mouth every 8 (eight) hours as needed for pain. Dr Nelva Bush      . LORazepam (ATIVAN) 0.5 MG tablet TAKE ONE TABLET BY MOUTH EVERY 8 HOURS AS NEEDED FOR ANXIETY  40 tablet  0  . omeprazole (PRILOSEC) 20 MG capsule       . ezetimibe (ZETIA) 10 MG tablet Take 1 tablet (10 mg total) by mouth daily.  30 tablet  5  . fenofibrate 54 MG tablet Take 1 tablet (54 mg total) by mouth daily.  30 tablet  5   No current facility-administered medications on file prior to visit.   No Known Allergies Immunization History  Administered Date(s) Administered  . Pneumococcal Polysaccharide 07/18/2011  . Tdap 07/18/2011  . Zoster 09/17/2011   Prior to Admission medications   Medication Sig Start Date End Date Taking? Authorizing Provider  cyclobenzaprine (FLEXERIL) 10 MG tablet TAKE ONE TABLET BY MOUTH ONCE DAILY AS NEEDED MUSCLE  SPASM 04/21/13  Yes Vernie Shanks,  MD  FLUoxetine (PROZAC) 40 MG capsule Take 2 capsules (80 mg total) by mouth daily. 04/21/13  Yes Vernie Shanks, MD  HYDROcodone-acetaminophen (NORCO) 10-325 MG per tablet Take 1 tablet by mouth every 8 (eight) hours as needed for pain. Dr Nelva Bush   Yes Historical Provider, MD  LORazepam (ATIVAN) 0.5 MG tablet TAKE ONE TABLET BY MOUTH EVERY 8 HOURS AS NEEDED FOR ANXIETY 05/22/13  Yes Vernie Shanks, MD  omeprazole (PRILOSEC) 20 MG capsule  02/15/13  Yes Historical Provider, MD  ezetimibe (ZETIA) 10 MG tablet Take 1 tablet (10 mg total) by mouth daily. 03/29/13   Vernie Shanks, MD  fenofibrate 54 MG tablet Take 1 tablet (54 mg total) by mouth daily. 02/20/13   Vernie Shanks, MD     ROS: As above in the HPI. All other systems are  stable or negative.  OBJECTIVE: APPEARANCE:  Patient in no acute distress.The patient appeared well nourished and normally developed. Acyanotic. Waist: VITAL SIGNS:BP 135/75  Pulse 97  Temp(Src) 97.7 F (36.5 C) (Oral)  Wt 212 lb 12.8 oz (96.525 kg)  BMI 35.41 kg/m2 WF obese SKIN: warm and  Dry without overt rashes, tattoos and scars  HEAD and Neck: without JVD, Head and scalp: normal Eyes:No scleral icterus. Fundi normal, eye movements normal. Ears: Auricle normal, canal normal, Tympanic membranes normal, insufflation normal. Nose: normal Throat: normal Neck & thyroid: normal  CHEST & LUNGS: Chest wall: normal Lungs: Clear  CVS: Reveals the PMI to be normally located. Regular rhythm, First and Second Heart sounds are normal,  absence of murmurs, rubs or gallops. Peripheral vasculature: Radial pulses: normal Dorsal pedis pulses: normal Posterior pulses: normal  ABDOMEN:  Appearance:Obese Benign, no organomegaly, no masses, no Abdominal Aortic enlargement. No Guarding , no rebound. No Bruits. Bowel sounds: normal  RECTAL: N/A GU: N/A  EXTREMETIES: nonedematous. Both Femoral and Pedal pulses are normal.  MUSCULOSKELETAL:  Spine: normal Joints: left shoulder has reduced ROM and discomfort to alpation.  NEUROLOGIC: oriented to time,place and person; nonfocal. Gait a little unsteady. Results for orders placed in visit on 04/13/13  VITAMIN D 25 HYDROXY      Result Value Range   Vit D, 25-Hydroxy 32  30 - 89 ng/mL  FERRITIN      Result Value Range   Ferritin 29  10 - 291 ng/mL     ASSESSMENT: HLD (hyperlipidemia) - Plan: CMP14+EGFR, NMR, lipoprofile, cholestyramine (QUESTRAN) 4 G packet  Elevated blood pressure - Plan: CMP14+EGFR, TSH  Generalized anxiety disorder  Fracture, shoulder, left, sequela  GERD (gastroesophageal reflux disease) - Plan: omeprazole (PRILOSEC) 20 MG capsule  Restless legs - Plan: Ferritin  Unspecified vitamin D deficiency  - Plan: Vitamin D 25 hydroxy intolerant to statins, Zetia, and fenofibrate.   PLAN: Orders Placed This Encounter  Procedures  . CMP14+EGFR  . NMR, lipoprofile  . Ferritin  . TSH  . Vitamin D 25 hydroxy    Meds ordered this encounter  Medications  . cholestyramine (QUESTRAN) 4 G packet    Sig: Take 1 packet by mouth 2 (two) times daily with a meal.    Dispense:  60 each    Refill:  12  . omeprazole (PRILOSEC) 20 MG capsule    Sig: Take 1 capsule (20 mg total) by mouth daily.    Dispense:  30 capsule    Refill:  11        Dr Paula Libra Recommendations  Diet and Exercise discussed with patient.  For  nutrition information, I recommend books:  1).Eat to Live by Dr Excell Seltzer. 2).Prevent and Reverse Heart Disease by Dr Karl Luke. 3) Dr Janene Harvey Book:  Program to Reverse Diabetes  Exercise recommendations are:  If unable to walk, then the patient can exercise in a chair 3 times a day. By flapping arms like a bird gently and raising legs outwards to the front.  If ambulatory, the patient can go for walks for 30 minutes 3 times a week. Then increase the intensity and duration as tolerated.  Goal is to try to attain exercise frequency to 5 times a week.  If applicable: Best to perform resistance exercises (machines or weights) 2 days a week and cardio type exercises 3 days per week.  Return in about 3 months (around 09/23/2013) for Recheck medical problems.  Kiya Eno P. Jacelyn Grip, M.D.

## 2013-06-26 ENCOUNTER — Encounter: Payer: Self-pay | Admitting: *Deleted

## 2013-06-28 NOTE — Progress Notes (Signed)
Quick Note:  Labs abnormal. Missing NMR Lipomed panel!! Vit D too low. Start on 2000 units daily. Sugar elevated. Needs a HGBA1C.Please. Follow up in 6 weeks to recheck the Vitamin D level. ______

## 2013-06-30 LAB — NMR, LIPOPROFILE
Cholesterol: 437 mg/dL — ABNORMAL HIGH (ref <200)
HDL Cholesterol by NMR: 60 mg/dL (ref >=40)
HDL Particle Number: 35.8 umol/L (ref >=30.5)
LDL Particle Number: >3500 nmol/L — ABNORMAL HIGH (ref <1000)
LDL Size: 20.8 nm (ref >20.5)
LDLC SERPL CALC-MCNC: 343 mg/dL — ABNORMAL HIGH (ref <100)
LP-IR Score: 74 — ABNORMAL HIGH (ref <=45)
Small LDL Particle Number: 1764 nmol/L — ABNORMAL HIGH (ref <=527)
Triglycerides by NMR: 170 mg/dL — ABNORMAL HIGH (ref <150)

## 2013-06-30 LAB — CMP14+EGFR
ALT: 14 IU/L (ref 0–32)
AST: 13 IU/L (ref 0–40)
Albumin/Globulin Ratio: 2 (ref 1.1–2.5)
Albumin: 4.7 g/dL (ref 3.5–5.5)
Alkaline Phosphatase: 99 IU/L (ref 39–117)
BUN/Creatinine Ratio: 15 (ref 9–23)
BUN: 9 mg/dL (ref 6–24)
CO2: 22 mmol/L (ref 18–29)
Calcium: 10.2 mg/dL (ref 8.7–10.2)
Chloride: 100 mmol/L (ref 97–108)
Creatinine, Ser: 0.59 mg/dL (ref 0.57–1.00)
GFR calc Af Amer: 117 mL/min/{1.73_m2} (ref >59)
GFR calc non Af Amer: 101 mL/min/{1.73_m2} (ref >59)
Globulin, Total: 2.3 g/dL (ref 1.5–4.5)
Glucose: 132 mg/dL — ABNORMAL HIGH (ref 65–99)
Potassium: 4.7 mmol/L (ref 3.5–5.2)
Sodium: 139 mmol/L (ref 134–144)
Total Bilirubin: 0.3 mg/dL (ref 0.0–1.2)
Total Protein: 7 g/dL (ref 6.0–8.5)

## 2013-06-30 LAB — VITAMIN D 25 HYDROXY (VIT D DEFICIENCY, FRACTURES): Vit D, 25-Hydroxy: 15.8 ng/mL — ABNORMAL LOW (ref 30.0–100.0)

## 2013-06-30 LAB — TSH: TSH: 0.639 u[IU]/mL (ref 0.450–4.500)

## 2013-06-30 LAB — FERRITIN: Ferritin: 30 ng/mL (ref 15–150)

## 2013-07-19 ENCOUNTER — Other Ambulatory Visit: Payer: Self-pay | Admitting: Family Medicine

## 2013-07-20 NOTE — Telephone Encounter (Signed)
Last seen 06/23/13  FPW    If approved route to nurse to phone in

## 2013-07-21 NOTE — Telephone Encounter (Signed)
Rx ready for Phone in.

## 2013-07-22 NOTE — Telephone Encounter (Signed)
rx for lorazepam and cyclobenazaprine called to [facility] as listed below

## 2013-08-01 ENCOUNTER — Encounter: Payer: Self-pay | Admitting: *Deleted

## 2013-09-21 ENCOUNTER — Ambulatory Visit (INDEPENDENT_AMBULATORY_CARE_PROVIDER_SITE_OTHER): Payer: BC Managed Care – PPO | Admitting: Family Medicine

## 2013-09-21 ENCOUNTER — Encounter: Payer: Self-pay | Admitting: Family Medicine

## 2013-09-21 ENCOUNTER — Other Ambulatory Visit: Payer: Self-pay

## 2013-09-21 VITALS — BP 118/65 | HR 86 | Temp 97.9°F | Ht 66.0 in | Wt 225.0 lb

## 2013-09-21 DIAGNOSIS — E785 Hyperlipidemia, unspecified: Secondary | ICD-10-CM

## 2013-09-21 DIAGNOSIS — R030 Elevated blood-pressure reading, without diagnosis of hypertension: Secondary | ICD-10-CM

## 2013-09-21 DIAGNOSIS — G2581 Restless legs syndrome: Secondary | ICD-10-CM

## 2013-09-21 DIAGNOSIS — E559 Vitamin D deficiency, unspecified: Secondary | ICD-10-CM

## 2013-09-21 DIAGNOSIS — IMO0001 Reserved for inherently not codable concepts without codable children: Secondary | ICD-10-CM

## 2013-09-21 DIAGNOSIS — R7309 Other abnormal glucose: Secondary | ICD-10-CM

## 2013-09-21 DIAGNOSIS — Z23 Encounter for immunization: Secondary | ICD-10-CM | POA: Insufficient documentation

## 2013-09-21 DIAGNOSIS — R739 Hyperglycemia, unspecified: Secondary | ICD-10-CM

## 2013-09-21 DIAGNOSIS — R7303 Prediabetes: Secondary | ICD-10-CM | POA: Insufficient documentation

## 2013-09-21 DIAGNOSIS — K219 Gastro-esophageal reflux disease without esophagitis: Secondary | ICD-10-CM

## 2013-09-21 DIAGNOSIS — F411 Generalized anxiety disorder: Secondary | ICD-10-CM

## 2013-09-21 LAB — POCT GLYCOSYLATED HEMOGLOBIN (HGB A1C): Hemoglobin A1C: 5.2

## 2013-09-21 MED ORDER — PRAVASTATIN SODIUM 10 MG PO TABS: 10.0000 mg | ORAL_TABLET | Freq: Every day | ORAL | Status: DC

## 2013-09-21 MED ORDER — CYCLOBENZAPRINE HCL 10 MG PO TABS: ORAL_TABLET | ORAL | Status: DC

## 2013-09-21 MED ORDER — LORAZEPAM 0.5 MG PO TABS: 0.5000 mg | ORAL_TABLET | Freq: Three times a day (TID) | ORAL | Status: DC | PRN

## 2013-09-21 NOTE — Progress Notes (Signed)
Patient ID: Lori Cooper, female   DOB: June 13, 1956, 57 y.o.   MRN: [id number] SUBJECTIVE: CC: Chief Complaint  Patient presents with  . Follow-up    3 MONTH FOLLOW UP  CHRONIC PROBLEMS  . Medication Refill    REFILL LORAZEPAM AND CYCLOBENAZPRINE     HPI: Patient is here for follow up of hyperlipidemia/gestational DM/anxiety/Vit D Def/ RLS: denies Headache;denies Chest Pain;denies weakness;denies Shortness of Breath and orthopnea;denies Visual changes;denies palpitations;denies cough;denies pedal edema;denies symptoms of TIA or stroke;deniesClaudication symptoms. admits to Compliance with medications; Problems with medications.cramps and terrible body aches with statins, and could not afford questran. So she is taking red yeast rice. Cannot see herself being a vegetarian.    Past Medical History  Diagnosis Date  . Anxiety   . Depression   . GERD (gastroesophageal reflux disease)   . Fracture, shoulder 12/14/2012    left   Past Surgical History  Procedure Laterality Date  . Fracture surgery      left shoulder 2005  . Tubal ligation    . Cholecystectomy    . Carpal tunnel release  2011    bilateral      . Ganglion cyst excision     History   Social History  . Marital Status: Married    Spouse Name: N/A    Number of Children: N/A  . Years of Education: N/A   Occupational History  . diability    Social History Main Topics  . Smoking status: Former Smoker -- 0.50 packs/day    Types: Cigarettes    Start date: 02/20/1973    Quit date: 06/16/2013  . Smokeless tobacco: Not on file  . Alcohol Use: Not on file  . Drug Use: Not on file  . Sexual Activity: Not on file   Other Topics Concern  . Not on file   Social History Narrative  . No narrative on file   Family History  Problem Relation Age of Onset  . Hypertension Mother   . Kidney disease Mother   . Diabetes Mother    Current Outpatient Prescriptions on File Prior to Visit  Medication Sig Dispense Refill   . FLUoxetine (PROZAC) 40 MG capsule Take 2 capsules (80 mg total) by mouth daily.  180 capsule  1  . HYDROcodone-acetaminophen (NORCO) 10-325 MG per tablet Take 1 tablet by mouth every 8 (eight) hours as needed for pain. Dr Nelva Bush      . omeprazole (PRILOSEC) 20 MG capsule Take 1 capsule (20 mg total) by mouth daily.  30 capsule  11   No current facility-administered medications on file prior to visit.   No Known Allergies Immunization History  Administered Date(s) Administered  . Influenza,inj,Quad PF,36+ Mos 09/21/2013  . Pneumococcal Polysaccharide 07/18/2011  . Tdap 07/18/2011  . Zoster 09/17/2011   Prior to Admission medications   Medication Sig Start Date End Date Taking? Authorizing Provider  cyclobenzaprine (FLEXERIL) 10 MG tablet TAKE ONE TABLET BY MOUTH ONCE DAILY AS NEEDED FOR MUSCLE SPASM 07/19/13  Yes Vernie Shanks, MD  FLUoxetine (PROZAC) 40 MG capsule Take 2 capsules (80 mg total) by mouth daily. 04/21/13  Yes Vernie Shanks, MD  HYDROcodone-acetaminophen (NORCO) 10-325 MG per tablet Take 1 tablet by mouth every 8 (eight) hours as needed for pain. Dr Nelva Bush   Yes Historical Provider, MD  LORazepam (ATIVAN) 0.5 MG tablet TAKE ONE TABLET BY MOUTH EVERY 8 HOURS AS NEEDED FOR ANXIETY 07/19/13  Yes Vernie Shanks, MD  omeprazole (PRILOSEC) 20 MG  capsule Take 1 capsule (20 mg total) by mouth daily. 06/23/13  Yes Vernie Shanks, MD     ROS: As above in the HPI. All other systems are stable or negative.  OBJECTIVE: APPEARANCE:  Patient in no acute distress.The patient appeared well nourished and normally developed. Acyanotic. Waist: VITAL SIGNS:BP 118/65  Pulse 86  Temp(Src) 97.9 F (36.6 C) (Oral)  Ht 5\' 6"  (1.676 m)  Wt 225 lb (102.059 kg)  BMI 36.33 kg/m2  Obese WF SKIN: warm and  Dry without overt rashes, tattoos and scars  HEAD and Neck: without JVD, Head and scalp: normal Eyes:No scleral icterus. Fundi normal, eye movements normal. Ears: Auricle normal, canal  normal, Tympanic membranes normal, insufflation normal. Nose: normal Throat: normal Neck & thyroid: normal  CHEST & LUNGS: Chest wall: normal Lungs: Clear  CVS: Reveals the PMI to be normally located. Regular rhythm, First and Second Heart sounds are normal,  absence of murmurs, rubs or gallops. Peripheral vasculature: Radial pulses: normal Dorsal pedis pulses: normal Posterior pulses: normal  ABDOMEN:  Appearance: Obese Benign, no organomegaly, no masses, no Abdominal Aortic enlargement. No Guarding , no rebound. No Bruits. Bowel sounds: normal  RECTAL: N/A GU: N/A  EXTREMETIES: nonedematous.  MUSCULOSKELETAL:  Spine: normal Joints: intact  NEUROLOGIC: oriented to time,place and person; nonfocal. Strength is normal Sensory is normal Reflexes are normal Cranial Nerves are normal. Psychiatric: gets anxious and needs prn ativan  ASSESSMENT: Generalized anxiety disorder - Plan: LORazepam (ATIVAN) 0.5 MG tablet  HLD (hyperlipidemia) - Plan: CMP14+EGFR, NMR, lipoprofile, TSH, pravastatin (PRAVACHOL) 10 MG tablet  Need for prophylactic vaccination and inoculation against influenza  Restless legs - Plan: cyclobenzaprine (FLEXERIL) 10 MG tablet  Elevated blood pressure - Plan: CMP14+EGFR  GERD (gastroesophageal reflux disease)  Unspecified vitamin D deficiency - Plan: Vit D  25 hydroxy (rtn osteoporosis monitoring)  Hyperglycemia - Plan: POCT glycosylated hemoglobin (Hb A1C), CMP14+EGFR  PLAN:       Dr Paula Libra Recommendations  For nutrition information, I recommend books:  1).Eat to Live by Dr Excell Seltzer. 2).Prevent and Reverse Heart Disease by Dr Karl Luke. 3) Dr Janene Harvey Book:  Program to Reverse Diabetes  Exercise recommendations are:  If unable to walk, then the patient can exercise in a chair 3 times a day. By flapping arms like a bird gently and raising legs outwards to the front.  If ambulatory, the patient can go for walks  for 30 minutes 3 times a week. Then increase the intensity and duration as tolerated.  Goal is to try to attain exercise frequency to 5 times a week.  If applicable: Best to perform resistance exercises (machines or weights) 2 days a week and cardio type exercises 3 days per week.  Orders Placed This Encounter  Procedures  . CMP14+EGFR  . NMR, lipoprofile  . TSH  . Vit D  25 hydroxy (rtn osteoporosis monitoring)  . POCT glycosylated hemoglobin (Hb A1C)   Meds ordered this encounter  Medications  . RED YEAST RICE EXTRACT PO    Sig: Take 1 tablet by mouth daily.  . cyclobenzaprine (FLEXERIL) 10 MG tablet    Sig: TAKE ONE TABLET BY MOUTH ONCE DAILY AS NEEDED FOR MUSCLE SPASM    Dispense:  30 tablet    Refill:  2  . LORazepam (ATIVAN) 0.5 MG tablet    Sig: Take 1 tablet (0.5 mg total) by mouth every 8 (eight) hours as needed for anxiety.    Dispense:  40 tablet  Refill:  0  . pravastatin (PRAVACHOL) 10 MG tablet    Sig: Take 1 tablet (10 mg total) by mouth daily.    Dispense:  30 tablet    Refill:  5   Medications Discontinued During This Encounter  Medication Reason  . cholestyramine (QUESTRAN) 4 G packet Change in therapy  . cyclobenzaprine (FLEXERIL) 10 MG tablet Reorder  . LORazepam (ATIVAN) 0.5 MG tablet Reorder  spent 30 minutes counseling her risks. Her calculated risks for 10 years is significant with her LDLc >300. The fortunate thing is that she has stopped smoking. I recommend trial of livalo, even with the cost. Patient agreed to try pravastatin at a low dose which she recalls worked for her in the past.  Return in about 4 months (around 01/19/2014) for [place] medical problems.  Danialle Dement P. Jacelyn Grip, M.D.

## 2013-09-21 NOTE — Patient Instructions (Signed)
      Dr Darshan Solanki's Recommendations  For nutrition information, I recommend books:  1).Eat to Live by Dr Joel Fuhrman. 2).Prevent and Reverse Heart Disease by Dr Caldwell Esselstyn. 3) Dr Neal Barnard's Book:  Program to Reverse Diabetes  Exercise recommendations are:  If unable to walk, then the patient can exercise in a chair 3 times a day. By flapping arms like a bird gently and raising legs outwards to the front.  If ambulatory, the patient can go for walks for 30 minutes 3 times a week. Then increase the intensity and duration as tolerated.  Goal is to try to attain exercise frequency to 5 times a week.  If applicable: Best to perform resistance exercises (machines or weights) 2 days a week and cardio type exercises 3 days per week.  

## 2013-09-23 LAB — NMR, LIPOPROFILE
Cholesterol: 394 mg/dL — ABNORMAL HIGH (ref <200)
HDL Cholesterol by NMR: 56 mg/dL (ref >=40)
HDL Particle Number: 46.5 umol/L (ref >=30.5)
LDL Particle Number: >3500 nmol/L — ABNORMAL HIGH (ref <1000)
LDL Size: 21.1 nm (ref >20.5)
LDLC SERPL CALC-MCNC: 262 mg/dL — ABNORMAL HIGH (ref <100)
LP-IR Score: 77 — ABNORMAL HIGH (ref <=45)
Small LDL Particle Number: 1928 nmol/L — ABNORMAL HIGH (ref <=527)
Triglycerides by NMR: 380 mg/dL — ABNORMAL HIGH (ref <150)

## 2013-09-23 LAB — CMP14+EGFR
ALT: 17 IU/L (ref 0–32)
AST: 16 IU/L (ref 0–40)
Albumin/Globulin Ratio: 1.9 (ref 1.1–2.5)
Albumin: 4.5 g/dL (ref 3.5–5.5)
Alkaline Phosphatase: 93 IU/L (ref 39–117)
BUN/Creatinine Ratio: 20 (ref 9–23)
BUN: 13 mg/dL (ref 6–24)
CO2: 25 mmol/L (ref 18–29)
Calcium: 9.9 mg/dL (ref 8.7–10.2)
Chloride: 97 mmol/L (ref 97–108)
Creatinine, Ser: 0.65 mg/dL (ref 0.57–1.00)
GFR calc Af Amer: 113 mL/min/{1.73_m2} (ref >59)
GFR calc non Af Amer: 98 mL/min/{1.73_m2} (ref >59)
Globulin, Total: 2.4 g/dL (ref 1.5–4.5)
Glucose: 90 mg/dL (ref 65–99)
Potassium: 5 mmol/L (ref 3.5–5.2)
Sodium: 139 mmol/L (ref 134–144)
Total Bilirubin: 0.3 mg/dL (ref 0.0–1.2)
Total Protein: 6.9 g/dL (ref 6.0–8.5)

## 2013-09-23 LAB — TSH: TSH: 2.57 u[IU]/mL (ref 0.450–4.500)

## 2013-09-23 LAB — VITAMIN D 25 HYDROXY (VIT D DEFICIENCY, FRACTURES): Vit D, 25-Hydroxy: 27.8 ng/mL — ABNORMAL LOW (ref 30.0–100.0)

## 2013-09-26 ENCOUNTER — Ambulatory Visit: Payer: BC Managed Care – PPO | Admitting: Family Medicine

## 2013-10-17 ENCOUNTER — Encounter: Payer: BC Managed Care – PPO | Admitting: Pharmacist Clinician (PhC)/ Clinical Pharmacy Specialist

## 2013-10-18 ENCOUNTER — Other Ambulatory Visit: Payer: Self-pay | Admitting: Family Medicine

## 2013-10-19 ENCOUNTER — Encounter: Payer: Self-pay | Admitting: Pharmacist

## 2013-10-19 ENCOUNTER — Ambulatory Visit (INDEPENDENT_AMBULATORY_CARE_PROVIDER_SITE_OTHER): Payer: BC Managed Care – PPO | Admitting: Pharmacist

## 2013-10-19 VITALS — BP 122/78 | HR 82 | Ht 66.0 in | Wt 226.5 lb

## 2013-10-19 DIAGNOSIS — E785 Hyperlipidemia, unspecified: Secondary | ICD-10-CM

## 2013-10-19 NOTE — Progress Notes (Signed)
Lipid Clinic Consultation  Chief Complaint:   Chief Complaint  Patient presents with  . Hyperlipidemia     HPI:  Patient referred by Dr Jacelyn Grip to discuss drug and diet therapy related to hyperlipidemia.  She has a history of intolerance to statins - has tried simvastatin, atorvastatin and rosuvastatin and experienced nausea and dizziness with each of these.  She recently started pravastatin 10mg  daily and is tolerating currently.  Lipid Panel form 09/21/13 LDL-P = greater than 3500 LDL-C = 262 Total Cholesterol = 394 Tg = 380  Assessment: CHD/CHF Risk Equivalents:  none AHA 10 year risk estimate = 4.8% NCEP Risk Factors Present:  family history and age Primary Problem(s):  LDL or LDL-P elevated and TG elevated  Current NCEP Goals: LDL Goal < 100 HDL Goal >/= 45 Tg Goal < 150 Non-HDL Goal < 130  Secondary cause of hyperlipidemia present:  History of elevated BG Low fat / low carb diet followed?  No - basic 4 cereal with 2% milk or oatmeal for breakfast.    Lunch - sometimes skips  Supper - pork n beans, home fried potatoes and hot dogs  Snacks - fruit and cereal bars, granola bars, chex mix with peanuts (no candy or chocolate), ice cream  Drinks - coffee, water, diet rite (but rarely) Exercise?  No -    Assessment: Hyperlipidemia    Recommendations: Changes in lipid medication(s):  None - continue with pravastatin given her history of difficulty tolerating statins in past.  I think that she is going to need a much higher dose of pravastatin to get LDL down by 50%.  Can also add Zetia if unable to tolerate higher pravastatin dose in future. DIscussed limiting high fat foods and CHO's, increasing vegetable and whole grains / fiber. Patient to start exercise as tolerated - recumbent bike (10 minutes to start)   Recheck Lipid Panel:  January 2015  Cherre Robins, PharmD, CPP

## 2013-11-14 ENCOUNTER — Other Ambulatory Visit: Payer: Self-pay | Admitting: Family Medicine

## 2013-11-17 NOTE — Telephone Encounter (Signed)
Last seen 09/21/13  FPW  IF approved route to nurse to phone into [facility]

## 2013-11-17 NOTE — Telephone Encounter (Signed)
rx called to [facility]

## 2013-11-17 NOTE — Telephone Encounter (Signed)
Rx ready for nurse to Phone in. 

## 2013-11-23 ENCOUNTER — Other Ambulatory Visit: Payer: Self-pay

## 2013-12-04 ENCOUNTER — Other Ambulatory Visit (INDEPENDENT_AMBULATORY_CARE_PROVIDER_SITE_OTHER): Payer: BC Managed Care – PPO

## 2013-12-04 DIAGNOSIS — R7989 Other specified abnormal findings of blood chemistry: Secondary | ICD-10-CM

## 2013-12-04 DIAGNOSIS — E785 Hyperlipidemia, unspecified: Secondary | ICD-10-CM

## 2013-12-04 NOTE — Progress Notes (Signed)
Pt came in for labs only 

## 2013-12-05 LAB — LIPID PANEL
Chol/HDL Ratio: 5.7 ratio units — ABNORMAL HIGH (ref 0.0–4.4)
Cholesterol, Total: 269 mg/dL — ABNORMAL HIGH (ref 100–199)
HDL: 47 mg/dL (ref >39)
LDL Calculated: 176 mg/dL — ABNORMAL HIGH (ref 0–99)
Triglycerides: 228 mg/dL — ABNORMAL HIGH (ref 0–149)
VLDL Cholesterol Cal: 46 mg/dL — ABNORMAL HIGH (ref 5–40)

## 2013-12-05 LAB — LDL CHOLESTEROL, DIRECT: LDL Direct: 199 mg/dL — ABNORMAL HIGH (ref 0–99)

## 2013-12-06 ENCOUNTER — Telehealth: Payer: Self-pay | Admitting: Pharmacist

## 2013-12-06 ENCOUNTER — Encounter: Payer: Self-pay | Admitting: Pharmacist

## 2013-12-06 MED ORDER — PRAVASTATIN SODIUM 20 MG PO TABS: ORAL_TABLET | ORAL | Status: DC

## 2013-12-06 NOTE — Telephone Encounter (Signed)
LDL better but not at goals. Patient is currently taking pravastatin 10mg  daily (is intolerant to other statins) She also mentions occassional nausea when she takes pravastatin.  Recommended increase pravastatin to 20mg  and advised to take after evening meal to get better results and hopefully decreased nausea.

## 2014-01-15 ENCOUNTER — Other Ambulatory Visit: Payer: Self-pay

## 2014-01-22 ENCOUNTER — Other Ambulatory Visit: Payer: Self-pay | Admitting: Family Medicine

## 2014-01-23 NOTE — Telephone Encounter (Signed)
Last seen 09/21/13 FPW  Requesting 90 day supply Prozac  If approved route to nurse to call into [facility]

## 2014-01-24 NOTE — Telephone Encounter (Signed)
Phoned in Ativan to [facility]. Prozac should have transmitted electronically.

## 2014-01-24 NOTE — Telephone Encounter (Signed)
Rx ready for nurse to Phone in. 

## 2014-02-13 ENCOUNTER — Ambulatory Visit (INDEPENDENT_AMBULATORY_CARE_PROVIDER_SITE_OTHER): Payer: BC Managed Care – PPO | Admitting: Family Medicine

## 2014-02-13 ENCOUNTER — Encounter: Payer: Self-pay | Admitting: Family Medicine

## 2014-02-13 VITALS — BP 130/79 | HR 74 | Temp 98.3°F | Ht 66.0 in | Wt 222.2 lb

## 2014-02-13 DIAGNOSIS — E785 Hyperlipidemia, unspecified: Secondary | ICD-10-CM

## 2014-02-13 DIAGNOSIS — E669 Obesity, unspecified: Secondary | ICD-10-CM

## 2014-02-13 DIAGNOSIS — E559 Vitamin D deficiency, unspecified: Secondary | ICD-10-CM

## 2014-02-13 DIAGNOSIS — F411 Generalized anxiety disorder: Secondary | ICD-10-CM

## 2014-02-13 DIAGNOSIS — R030 Elevated blood-pressure reading, without diagnosis of hypertension: Secondary | ICD-10-CM

## 2014-02-13 DIAGNOSIS — K219 Gastro-esophageal reflux disease without esophagitis: Secondary | ICD-10-CM

## 2014-02-13 DIAGNOSIS — G2581 Restless legs syndrome: Secondary | ICD-10-CM

## 2014-02-13 DIAGNOSIS — IMO0001 Reserved for inherently not codable concepts without codable children: Secondary | ICD-10-CM

## 2014-02-13 DIAGNOSIS — K439 Ventral hernia without obstruction or gangrene: Secondary | ICD-10-CM

## 2014-02-13 NOTE — Progress Notes (Signed)
Patient ID: Lori Cooper, female   DOB: 1956-09-12, 58 y.o.   MRN: [id number] SUBJECTIVE: CC: Chief Complaint  Patient presents with  . Follow-up    4 mnonth ck up fasting for labs     HPI: Here for follow up and fasting labs and GERD follow up.  Patient is here for follow up of hyperlipidemia: denies Headache;denies Chest Pain;denies weakness;denies Shortness of Breath and orthopnea;denies Visual changes;denies palpitations;denies cough;denies pedal edema;denies symptoms of TIA or stroke;deniesClaudication symptoms. admits to Compliance with medications; denies Problems with medications.  H/o hyperglycemia and elevated bP.  Occasional outpouching near her belly button and it gets sore but not trapped. Thinks it is a hernia.   Past Medical History  Diagnosis Date  . Anxiety   . Depression   . GERD (gastroesophageal reflux disease)   . Fracture, shoulder 12/14/2012    left   Past Surgical History  Procedure Laterality Date  . Fracture surgery      left shoulder 2005  . Tubal ligation    . Cholecystectomy    . Carpal tunnel release  2011    bilateral      . Ganglion cyst excision     History   Social History  . Marital Status: Married    Spouse Name: N/A    Number of Children: N/A  . Years of Education: N/A   Occupational History  . diability    Social History Main Topics  . Smoking status: Former Smoker -- 0.50 packs/day    Types: Cigarettes    Start date: 02/20/1973    Quit date: 06/16/2013  . Smokeless tobacco: Not on file  . Alcohol Use: Not on file  . Drug Use: Not on file  . Sexual Activity: Not on file   Other Topics Concern  . Not on file   Social History Narrative  . No narrative on file   Family History  Problem Relation Age of Onset  . Hypertension Mother   . Kidney disease Mother   . Diabetes Mother    Current Outpatient Prescriptions on File Prior to Visit  Medication Sig Dispense Refill  . aspirin 81 MG tablet Take 81 mg by mouth  every other day.      . Cholecalciferol (VITAMIN D PO) Take 5,000 Units by mouth daily.      . cyclobenzaprine (FLEXERIL) 10 MG tablet TAKE ONE TABLET BY MOUTH ONCE DAILY AS NEEDED FOR MUSCLE SPASM  30 tablet  2  . FLUoxetine (PROZAC) 40 MG capsule TAKE TWO CAPSULES BY MOUTH ONCE DAILY  180 capsule  0  . HYDROcodone-acetaminophen (NORCO) 10-325 MG per tablet Take 1 tablet by mouth every 8 (eight) hours as needed for pain. Dr Ethelene Hal      . LORazepam (ATIVAN) 0.5 MG tablet TAKE ONE TABLET BY MOUTH EVERY 8 HOURS AS NEEDED FOR ANXIETY  40 tablet  0  . omeprazole (PRILOSEC) 20 MG capsule Take 1 capsule (20 mg total) by mouth daily.  30 capsule  11  . pravastatin (PRAVACHOL) 20 MG tablet Take 1 tablet by mouth daily with evening meal for cholesterol  90 tablet  0   No current facility-administered medications on file prior to visit.   No Known Allergies Immunization History  Administered Date(s) Administered  . Influenza,inj,Quad PF,36+ Mos 09/21/2013  . Pneumococcal Polysaccharide-23 07/18/2011  . Tdap 07/18/2011  . Zoster 09/17/2011   Prior to Admission medications   Medication Sig Start Date End Date Taking? Authorizing Provider  aspirin 81 MG  tablet Take 81 mg by mouth every other day.    Historical Provider, MD  Cholecalciferol (VITAMIN D PO) Take 5,000 Units by mouth daily.    Historical Provider, MD  cyclobenzaprine (FLEXERIL) 10 MG tablet TAKE ONE TABLET BY MOUTH ONCE DAILY AS NEEDED FOR MUSCLE SPASM 09/21/13   Ileana Ladd, MD  FLUoxetine (PROZAC) 40 MG capsule TAKE TWO CAPSULES BY MOUTH ONCE DAILY 10/18/13   Mary-Margaret Daphine Deutscher, FNP  HYDROcodone-acetaminophen (NORCO) 10-325 MG per tablet Take 1 tablet by mouth every 8 (eight) hours as needed for pain. Dr Ethelene Hal    Historical Provider, MD  LORazepam (ATIVAN) 0.5 MG tablet TAKE ONE TABLET BY MOUTH EVERY 8 HOURS AS NEEDED FOR ANXIETY    Ileana Ladd, MD  omeprazole (PRILOSEC) 20 MG capsule Take 1 capsule (20 mg total) by mouth daily.  06/23/13   Ileana Ladd, MD  pravastatin (PRAVACHOL) 20 MG tablet Take 1 tablet by mouth daily with evening meal for cholesterol 12/06/13   Tammy Eckard, PHARMD     ROS: As above in the HPI. All other systems are stable or negative.  OBJECTIVE: APPEARANCE:  Patient in no acute distress.The patient appeared well nourished and normally developed. Acyanotic. Waist: VITAL SIGNS:BP 130/79  Pulse 74  Temp(Src) 98.3 F (36.8 C) (Oral)  Ht 5\' 6"  (1.676 m)  Wt 222 lb 3.2 oz (100.789 kg)  BMI 35.88 kg/m2  Obese WF  SKIN: warm and  Dry without overt rashes, tattoos and scars  HEAD and Neck: without JVD, Head and scalp: normal Eyes:No scleral icterus. Fundi normal, eye movements normal. Ears: Auricle normal, canal normal, Tympanic membranes normal, insufflation normal. Nose: normal Throat: normal Neck & thyroid: normal  CHEST & LUNGS: Chest wall: normal Lungs: Clear  CVS: Reveals the PMI to be normally located. Regular rhythm, First and Second Heart sounds are normal,  absence of murmurs, rubs or gallops. Peripheral vasculature: Radial pulses: normal Dorsal pedis pulses: normal Posterior pulses: normal  ABDOMEN:  Appearance: obese Benign, no organomegaly, no masses, no Abdominal Aortic enlargement. No Guarding , no rebound. No Bruits. The re is a 2 cm. Out pouching adjacent to the umbilicus superiorly and to the right of the umbilicus. Reducible. Reproducible with valsalva.  Bowel sounds: normal  RECTAL: N/A GU: N/A  EXTREMETIES: nonedematous.  MUSCULOSKELETAL:  Spine: normal Joints: intact  NEUROLOGIC: oriented to time,place and person; nonfocal. Strength is normal Sensory is normal Reflexes are normal Cranial Nerves are normal.  ASSESSMENT:  HLD (hyperlipidemia) - Plan: CMP14+EGFR, Lipid panel  Elevated blood pressure - Plan: CMP14+EGFR  GERD (gastroesophageal reflux disease)  Generalized anxiety disorder  Unspecified vitamin D deficiency - Plan: Vit  D  25 hydroxy (rtn osteoporosis monitoring)  Restless legs  Hernia of abdominal wall - Plan: Ambulatory referral to General Surgery  Obesity  PLAN:  Handout in the AVS on hernia  Diet exercise weight reduction.       Dr Woodroe Mode Recommendations  For nutrition information, I recommend books:  1).Eat to Live by Dr Monico Hoar. 2).Prevent and Reverse Heart Disease by Dr Suzzette Righter. 3) Dr Katherina Right Book:  Program to Reverse Diabetes  Exercise recommendations are:  If unable to walk, then the patient can exercise in a chair 3 times a day. By flapping arms like a bird gently and raising legs outwards to the front.  If ambulatory, the patient can go for walks for 30 minutes 3 times a week. Then increase the intensity and duration as tolerated.  Goal is to try to attain exercise frequency to 5 times a week.  If applicable: Best to perform resistance exercises (machines or weights) 2 days a week and cardio type exercises 3 days per week.  Orders Placed This Encounter  Procedures  . CMP14+EGFR  . Lipid panel  . Vit D  25 hydroxy (rtn osteoporosis monitoring)  . Ambulatory referral to General Surgery    Referral Priority:  Routine    Referral Type:  Surgical    Referral Reason:  Specialty Services Required    Requested Specialty:  General Surgery    Number of Visits Requested:  1   No orders of the defined types were placed in this encounter.   Medications Discontinued During This Encounter  Medication Reason  . FLUoxetine (PROZAC) 40 MG capsule Duplicate   Return in about 3 months (around 05/15/2014) for Recheck medical problems.  Kholton Coate P. [place] Charon, M.D.

## 2014-02-13 NOTE — Patient Instructions (Signed)
      Dr Paula Libra Recommendations  For nutrition information, I recommend books:  1).Eat to Live by Dr Excell Seltzer. 2).Prevent and Reverse Heart Disease by Dr Karl Luke. 3) Dr Janene Harvey Book:  Program to Reverse Diabetes  Exercise recommendations are:  If unable to walk, then the patient can exercise in a chair 3 times a day. By flapping arms like a bird gently and raising legs outwards to the front.  If ambulatory, the patient can go for walks for 30 minutes 3 times a week. Then increase the intensity and duration as tolerated.  Goal is to try to attain exercise frequency to 5 times a week.  If applicable: Best to perform resistance exercises (machines or weights) 2 days a week and cardio type exercises 3 days per week.   Hernia A hernia occurs when an internal organ pushes out through a weak spot in the abdominal wall. Hernias most commonly occur in the groin and around the navel. Hernias often can be pushed back into place (reduced). Most hernias tend to get worse over time. Some abdominal hernias can get stuck in the opening (irreducible or incarcerated hernia) and cannot be reduced. An irreducible abdominal hernia which is tightly squeezed into the opening is at risk for impaired blood supply (strangulated hernia). A strangulated hernia is a medical emergency. Because of the risk for an irreducible or strangulated hernia, surgery may be recommended to repair a hernia. CAUSES   Heavy lifting.  Prolonged coughing.  Straining to have a bowel movement.  A cut (incision) made during an abdominal surgery. HOME CARE INSTRUCTIONS   Bed rest is not required. You may continue your normal activities.  Avoid lifting more than 10 pounds (4.5 kg) or straining.  Cough gently. If you are a smoker it is best to stop. Even the best hernia repair can break down with the continual strain of coughing. Even if you do not have your hernia repaired, a cough will continue to  aggravate the problem.  Do not wear anything tight over your hernia. Do not try to keep it in with an outside bandage or truss. These can damage abdominal contents if they are trapped within the hernia sac.  Eat a normal diet.  Avoid constipation. Straining over long periods of time will increase hernia size and encourage breakdown of repairs. If you cannot do this with diet alone, stool softeners may be used. SEEK IMMEDIATE MEDICAL CARE IF:   You have a fever.  You develop increasing abdominal pain.  You feel nauseous or vomit.  Your hernia is stuck outside the abdomen, looks discolored, feels hard, or is tender.  You have any changes in your bowel habits or in the hernia that are unusual for you.  You have increased pain or swelling around the hernia.  You cannot push the hernia back in place by applying gentle pressure while lying down. MAKE SURE YOU:   Understand these instructions.  Will watch your condition.  Will get help right away if you are not doing well or get worse. Document Released: 11/02/2005 Document Revised: 01/25/2012 Document Reviewed: 06/21/2008 [facility] Patient Information 2014 [place].

## 2014-02-14 ENCOUNTER — Other Ambulatory Visit: Payer: Self-pay | Admitting: Family Medicine

## 2014-02-14 DIAGNOSIS — E785 Hyperlipidemia, unspecified: Secondary | ICD-10-CM

## 2014-02-14 LAB — CMP14+EGFR
ALT: 20 IU/L (ref 0–32)
AST: 17 IU/L (ref 0–40)
Albumin/Globulin Ratio: 2.1 (ref 1.1–2.5)
Albumin: 4.7 g/dL (ref 3.5–5.5)
Alkaline Phosphatase: 122 IU/L — ABNORMAL HIGH (ref 39–117)
BUN/Creatinine Ratio: 19 (ref 9–23)
BUN: 14 mg/dL (ref 6–24)
CO2: 23 mmol/L (ref 18–29)
Calcium: 9.8 mg/dL (ref 8.7–10.2)
Chloride: 98 mmol/L (ref 97–108)
Creatinine, Ser: 0.75 mg/dL (ref 0.57–1.00)
GFR calc Af Amer: 101 mL/min/{1.73_m2} (ref >59)
GFR calc non Af Amer: 88 mL/min/{1.73_m2} (ref >59)
Globulin, Total: 2.2 g/dL (ref 1.5–4.5)
Glucose: 98 mg/dL (ref 65–99)
Potassium: 4.7 mmol/L (ref 3.5–5.2)
Sodium: 139 mmol/L (ref 134–144)
Total Bilirubin: 0.3 mg/dL (ref 0.0–1.2)
Total Protein: 6.9 g/dL (ref 6.0–8.5)

## 2014-02-14 LAB — LIPID PANEL
Chol/HDL Ratio: 5.8 ratio units — ABNORMAL HIGH (ref 0.0–4.4)
Cholesterol, Total: 328 mg/dL — ABNORMAL HIGH (ref 100–199)
HDL: 57 mg/dL (ref >39)
LDL Calculated: 210 mg/dL — ABNORMAL HIGH (ref 0–99)
Triglycerides: 303 mg/dL — ABNORMAL HIGH (ref 0–149)
VLDL Cholesterol Cal: 61 mg/dL — ABNORMAL HIGH (ref 5–40)

## 2014-02-14 LAB — VITAMIN D 25 HYDROXY (VIT D DEFICIENCY, FRACTURES): Vit D, 25-Hydroxy: 22.5 ng/mL — ABNORMAL LOW (ref 30.0–100.0)

## 2014-02-14 MED ORDER — ATORVASTATIN CALCIUM 40 MG PO TABS: 40.0000 mg | ORAL_TABLET | Freq: Every day | ORAL | Status: DC

## 2014-02-14 NOTE — Telephone Encounter (Signed)
Patient really wants to speak with Lori Cooper or doctor wong about her cholesterol what med does he suggest she take?

## 2014-02-22 ENCOUNTER — Other Ambulatory Visit: Payer: Self-pay | Admitting: Family Medicine

## 2014-02-22 NOTE — Telephone Encounter (Signed)
States took atorvastatin in past and had muscle aches States she been her husband simvastatin 80 mg and doing well  Would like to know if she can have rx for this and send to [facility].

## 2014-02-23 NOTE — Telephone Encounter (Signed)
Rx ready for nurse to Phone in. 

## 2014-02-23 NOTE — Telephone Encounter (Signed)
Patient last seen in office on 02-13-14. Last refill on 01-24-14. Please advise. If approved please route to Pool A so nurse can phone in to pharmacy

## 2014-02-23 NOTE — Telephone Encounter (Signed)
Called in.

## 2014-02-25 NOTE — Telephone Encounter (Signed)
Simvastatin has been related to serious side effects. The recommendation is  New starts only 20 mg. Established with heart disease stay on the 80 mg. If she is having problems with the atorvastatin then we need to change to something else because the 40 mg atorvastatin is equal to crestor 20 mg.

## 2014-02-26 MED ORDER — SIMVASTATIN 20 MG PO TABS: 20.0000 mg | ORAL_TABLET | Freq: Every day | ORAL | Status: DC

## 2014-02-26 NOTE — Telephone Encounter (Signed)
You would like to prescribe Simvastatin 80mg ?  I have set the prescription up to be signed.  Will patient need to have LFTs checked in 1 month?

## 2014-02-26 NOTE — Telephone Encounter (Signed)
Call patient : Prescription refilled & sent to pharmacy in [place]. Dose adjustment made. Office visit in 2 months to check labs

## 2014-02-26 NOTE — Telephone Encounter (Signed)
Left detailed message on patient's home voicemail.

## 2014-03-05 ENCOUNTER — Ambulatory Visit (INDEPENDENT_AMBULATORY_CARE_PROVIDER_SITE_OTHER): Payer: Self-pay | Admitting: General Surgery

## 2014-04-02 ENCOUNTER — Ambulatory Visit (INDEPENDENT_AMBULATORY_CARE_PROVIDER_SITE_OTHER): Payer: BC Managed Care – PPO | Admitting: Surgery

## 2014-04-02 ENCOUNTER — Ambulatory Visit (INDEPENDENT_AMBULATORY_CARE_PROVIDER_SITE_OTHER): Payer: Self-pay | Admitting: General Surgery

## 2014-04-02 ENCOUNTER — Encounter (INDEPENDENT_AMBULATORY_CARE_PROVIDER_SITE_OTHER): Payer: Self-pay | Admitting: Surgery

## 2014-04-02 VITALS — BP 128/60 | HR 78 | Resp 18 | Ht 65.0 in | Wt 230.0 lb

## 2014-04-02 DIAGNOSIS — K439 Ventral hernia without obstruction or gangrene: Secondary | ICD-10-CM

## 2014-04-02 DIAGNOSIS — K432 Incisional hernia without obstruction or gangrene: Secondary | ICD-10-CM

## 2014-04-02 DIAGNOSIS — K469 Unspecified abdominal hernia without obstruction or gangrene: Secondary | ICD-10-CM

## 2014-04-02 DIAGNOSIS — F411 Generalized anxiety disorder: Secondary | ICD-10-CM

## 2014-04-02 DIAGNOSIS — F419 Anxiety disorder, unspecified: Secondary | ICD-10-CM

## 2014-04-02 DIAGNOSIS — E669 Obesity, unspecified: Secondary | ICD-10-CM

## 2014-04-02 DIAGNOSIS — Z860100 Personal history of colon polyps, unspecified: Secondary | ICD-10-CM | POA: Insufficient documentation

## 2014-04-02 NOTE — Progress Notes (Signed)
Subjective:     Patient ID: Lori Cooper, female   DOB: Jun 07, 1956, 58 y.o.   MRN: [id number]  HPI  Note: This dictation was prepared with Dragon/digital dictation along with [employer]. Any transcriptional errors that result from this process are unintentional.       Hathaway Hotz  April 20, 1956 [id number]  Patient Care Team: Vernie Shanks, MD as PCP - General (Family Medicine)  This patient is a 58 y.o.female who presents today for surgical evaluation at the request of Dr. Jacelyn Grip.   Reason for visit: Hernia near her belly button.  Pleasant obese female.  Noted some pain in her bellybutton when she was lifting a few months ago.  Intermittently sore.  Mention that her primary care physician.  He was concern for hernia.  Surgical consultation recommended.  Since that time, the soreness is faded away.  She feels better.  She has had a tubal ligation and cholecystectomy laparoscopically done in the past.  Does not recall wound infections.  She can walk about 15 minutes before she has to stop secondary to back pain and foot/knee pain.  No shortness of breath or dyspnea on exertion.  Has a bowel movement every day.  History of colon polyps.  Think she is overdue for colonoscopy.  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  Patient Active Problem List   Diagnosis Date Noted  . Supraumbilical hernia  [id number]  . Incisional umbilical hernia [id number]  . Obesity 02/13/2014  . Need for prophylactic vaccination and inoculation against influenza 09/21/2013  . Hyperglycemia 09/21/2013  . Unspecified vitamin D deficiency 06/23/2013  . HLD (hyperlipidemia) 02/20/2013  . GERD (gastroesophageal reflux disease) 02/20/2013  . Generalized anxiety disorder 02/20/2013  . Restless legs 02/20/2013  . Elevated blood pressure 02/20/2013  . Fracture, shoulder     Past Medical History  Diagnosis Date  . Anxiety   . Depression   . GERD  (gastroesophageal reflux disease)   . Fracture, shoulder 12/14/2012    left    Past Surgical History  Procedure Laterality Date  . Fracture surgery      left shoulder 2005  . Tubal ligation    . Cholecystectomy    . Carpal tunnel release  2011    bilateral      . Ganglion cyst excision    . Tonsilectomy, adenoidectomy, bilateral myringotomy and tubes      History   Social History  . Marital Status: Married    Spouse Name: N/A    Number of Children: N/A  . Years of Education: N/A   Occupational History  . diability    Social History Main Topics  . Smoking status: Former Smoker -- 0.50 packs/day    Types: Cigarettes    Start date: 02/20/1973    Quit date: 06/16/2013  . Smokeless tobacco: Not on file  . Alcohol Use: No  . Drug Use: No  . Sexual Activity: Not on file   Other Topics Concern  . Not on file   Social History Narrative  . No narrative on file    Family History  Problem Relation Age of Onset  . Hypertension Mother   . Kidney disease Mother   . Diabetes Mother     Current Outpatient Prescriptions  Medication Sig Dispense Refill  . aspirin 81 MG tablet Take 81 mg by mouth every other day.      . Cholecalciferol (VITAMIN D PO) Take 5,000 Units  by mouth daily.      . cyclobenzaprine (FLEXERIL) 10 MG tablet TAKE ONE TABLET BY MOUTH ONCE DAILY AS NEEDED FOR MUSCLE SPASM  30 tablet  2  . FLUoxetine (PROZAC) 40 MG capsule TAKE TWO CAPSULES BY MOUTH ONCE DAILY  180 capsule  0  . HYDROcodone-acetaminophen (NORCO) 10-325 MG per tablet Take 1 tablet by mouth every 8 (eight) hours as needed for pain. Dr Nelva Bush      . KRILL OIL PO Take by mouth.      Marland Kitchen LORazepam (ATIVAN) 0.5 MG tablet TAKE ONE TABLET BY MOUTH EVERY 8 HOURS AS NEEDED  40 tablet  0  . omeprazole (PRILOSEC) 20 MG capsule Take 1 capsule (20 mg total) by mouth daily.  30 capsule  11  . simvastatin (ZOCOR) 20 MG tablet Take 1 tablet (20 mg total) by mouth daily.  30 tablet  2   No current  facility-administered medications for this visit.     No Known Allergies  BP 128/60  Pulse 78  Resp 18  Ht 5\' 5"  (1.651 m)  Wt 230 lb (104.327 kg)  BMI 38.27 kg/m2  No results found.   Review of Systems  Constitutional: Negative for fever, chills, diaphoresis, appetite change and fatigue.  HENT: Negative for ear discharge, ear pain, sore throat and trouble swallowing.   Eyes: Negative for photophobia, discharge and visual disturbance.  Respiratory: Negative for cough, choking, chest tightness, shortness of breath, wheezing and stridor.   Cardiovascular: Negative for chest pain and palpitations.  Gastrointestinal: Negative for nausea, vomiting, abdominal pain, diarrhea, constipation, anal bleeding and rectal pain.  Endocrine: Negative for cold intolerance and heat intolerance.  Genitourinary: Negative for dysuria, frequency and difficulty urinating.  Musculoskeletal: Positive for back pain and gait problem. Negative for myalgias and neck pain.  Skin: Negative for color change, pallor and rash.  Allergic/Immunologic: Negative for environmental allergies, food allergies and immunocompromised state.  Neurological: Negative for dizziness, speech difficulty, weakness and numbness.  Hematological: Negative for adenopathy.  Psychiatric/Behavioral: Negative for confusion and agitation. The patient is not nervous/anxious.        Objective:   Physical Exam  Constitutional: She is oriented to person, place, and time. She appears well-developed and well-nourished. No distress.  HENT:  Head: Normocephalic.  Mouth/Throat: Oropharynx is clear and moist. No oropharyngeal exudate.  Eyes: Conjunctivae and EOM are normal. Pupils are equal, round, and reactive to light. No scleral icterus.  Neck: Normal range of motion. Neck supple. No tracheal deviation present.  Cardiovascular: Normal rate, regular rhythm and intact distal pulses.   Pulmonary/Chest: Effort normal and breath sounds normal. No  stridor. No respiratory distress. She exhibits no tenderness.  Abdominal: Soft. She exhibits no distension and no mass. There is no tenderness. A hernia is present. Hernia confirmed positive in the ventral area. Hernia confirmed negative in the right inguinal area and confirmed negative in the left inguinal area.    Genitourinary: No vaginal discharge found.  Musculoskeletal: Normal range of motion. She exhibits no tenderness.       Right elbow: She exhibits normal range of motion.       Left elbow: She exhibits normal range of motion.       Right wrist: She exhibits normal range of motion.       Left wrist: She exhibits normal range of motion.       Right hand: Normal strength noted.       Left hand: Normal strength noted.  Lymphadenopathy:  Head (right side): No posterior auricular adenopathy present.       Head (left side): No posterior auricular adenopathy present.    She has no cervical adenopathy.    She has no axillary adenopathy.       Right: No inguinal adenopathy present.       Left: No inguinal adenopathy present.  Neurological: She is alert and oriented to person, place, and time. No cranial nerve deficit. She exhibits normal muscle tone. Coordination normal.  Skin: Skin is warm and dry. No rash noted. She is not diaphoretic. No erythema.  Psychiatric: She has a normal mood and affect. Her behavior is normal. Judgment and thought content normal.       Assessment:     Supraumbilical and umbilical hernias in the setting of prior periumbilical incisions in an obese patient     Plan:     Think she would benefit from surgical repair since she has had some episodes of pain.  She feels better now.  She would like to hold off until next year when her financial situation is better.  She has not had any episodes of severe pain or incarceration, it is reasonable to observe for now.  I did discuss the procedure with her.  Given probable multiple locations and obesity, I  recommended a laparoscopic approach with mesh.  Her husband required inguinal and periumbilical hernia repairs with mesh though she is familiar with recovery:  The anatomy & physiology of the abdominal wall was discussed.  The pathophysiology of hernias was discussed.  Natural history risks without surgery including progeressive enlargement, pain, incarceration & strangulation was discussed.   Contributors to complications such as smoking, obesity, diabetes, prior surgery, etc were discussed.   I feel the risks of no intervention will lead to serious problems that outweigh the operative risks; therefore, I recommended surgery to reduce and repair the hernia.  I explained laparoscopic techniques with possible need for an open approach.  I noted the probable use of mesh to patch and/or buttress the hernia repair  Risks such as bleeding, infection, abscess, need for further treatment, heart attack, death, and other risks were discussed.  I noted a good likelihood this will help address the problem.   Goals of post-operative recovery were discussed as well.  Possibility that this will not correct all symptoms was explained.  I stressed the importance of low-impact activity, aggressive pain control, avoiding constipation, & not pushing through pain to minimize risk of post-operative chronic pain or injury. Possibility of reherniation especially with smoking, obesity, diabetes, immunosuppression, and other health conditions was discussed.  We will work to minimize complications.     An educational handout further explaining the pathology & treatment options was given as well.  Questions were answered.  The patient expresses understanding & wishes to proceed with surgery.

## 2014-04-02 NOTE — Patient Instructions (Signed)
Please consider the recommendations that we have given you today:  Consider surgery to repair the hernia as near bellybutton.  Plan laparoscopic approach with a patch of mesh.  If you have worsening pain or swelling or nausea and vomiting, this could be an emergency.  Please call us in that situation.  See the Handout(s) we have given you.  Please call our office at [phone] if you wish to schedule surgery or if you have further questions / concerns.   Hernia A hernia occurs when an internal organ pushes out through a weak spot in the abdominal wall. Hernias most commonly occur in the groin and around the navel. Hernias often can be pushed back into place (reduced). Most hernias tend to get worse over time. Some abdominal hernias can get stuck in the opening (irreducible or incarcerated hernia) and cannot be reduced. An irreducible abdominal hernia which is tightly squeezed into the opening is at risk for impaired blood supply (strangulated hernia). A strangulated hernia is a medical emergency. Because of the risk for an irreducible or strangulated hernia, surgery may be recommended to repair a hernia. CAUSES   Heavy lifting.  Prolonged coughing.  Straining to have a bowel movement.  A cut (incision) made during an abdominal surgery. HOME CARE INSTRUCTIONS   Bed rest is not required. You may continue your normal activities.  Avoid lifting more than 10 pounds (4.5 kg) or straining.  Cough gently. If you are a smoker it is best to stop. Even the best hernia repair can break down with the continual strain of coughing. Even if you do not have your hernia repaired, a cough will continue to aggravate the problem.  Do not wear anything tight over your hernia. Do not try to keep it in with an outside bandage or truss. These can damage abdominal contents if they are trapped within the hernia sac.  Eat a normal diet.  Avoid constipation. Straining over long periods of time will increase  hernia size and encourage breakdown of repairs. If you cannot do this with diet alone, stool softeners may be used. SEEK IMMEDIATE MEDICAL CARE IF:   You have a fever.  You develop increasing abdominal pain.  You feel nauseous or vomit.  Your hernia is stuck outside the abdomen, looks discolored, feels hard, or is tender.  You have any changes in your bowel habits or in the hernia that are unusual for you.  You have increased pain or swelling around the hernia.  You cannot push the hernia back in place by applying gentle pressure while lying down. MAKE SURE YOU:   Understand these instructions.  Will watch your condition.  Will get help right away if you are not doing well or get worse. Document Released: 11/02/2005 Document Revised: 01/25/2012 Document Reviewed: 06/21/2008 [facility] Patient Information 2014 [place].  Exercise to Stay Healthy Exercise helps you become and stay healthy. EXERCISE IDEAS AND TIPS Choose exercises that:  You enjoy.  Fit into your day. You do not need to exercise really hard to be healthy. You can do exercises at a slow or medium level and stay healthy. You can:  Stretch before and after working out.  Try yoga, Pilates, or tai chi.  Lift weights.  Walk fast, swim, jog, run, climb stairs, bicycle, dance, or rollerskate.  Take aerobic classes. Exercises that burn about 150 calories:  Running 1  miles in 15 minutes.  Playing volleyball for 45 to 60 minutes.  Washing and waxing a car for  45 to 60 minutes.  Playing touch football for 45 minutes.  Walking 1  miles in 35 minutes.  Pushing a stroller 1  miles in 30 minutes.  Playing basketball for 30 minutes.  Raking leaves for 30 minutes.  Bicycling 5 miles in 30 minutes.  Walking 2 miles in 30 minutes.  Dancing for 30 minutes.  Shoveling snow for 15 minutes.  Swimming laps for 20 minutes.  Walking up stairs for 15 minutes.  Bicycling 4 miles in 15  minutes.  Gardening for 30 to 45 minutes.  Jumping rope for 15 minutes.  Washing windows or floors for 45 to 60 minutes. Document Released: 12/05/2010 Document Revised: 01/25/2012 Document Reviewed: 12/05/2010 [facility] Patient Information 2014 [place], [place].

## 2014-04-03 ENCOUNTER — Other Ambulatory Visit: Payer: Self-pay | Admitting: *Deleted

## 2014-04-03 MED ORDER — LORAZEPAM 0.5 MG PO TABS: ORAL_TABLET | ORAL | Status: DC

## 2014-04-03 NOTE — Telephone Encounter (Signed)
Please call in ativan with 1 refills 

## 2014-04-03 NOTE — Telephone Encounter (Signed)
Called in.

## 2014-04-03 NOTE — Telephone Encounter (Signed)
Last filled 02/23/14, last seen 02/13/14 by FPW, pt uses [facility]

## 2014-06-04 ENCOUNTER — Other Ambulatory Visit: Payer: Self-pay | Admitting: Nurse Practitioner

## 2014-06-05 ENCOUNTER — Other Ambulatory Visit: Payer: Self-pay | Admitting: *Deleted

## 2014-06-05 MED ORDER — SIMVASTATIN 20 MG PO TABS: 20.0000 mg | ORAL_TABLET | Freq: Every day | ORAL | Status: DC

## 2014-06-05 MED ORDER — FLUOXETINE HCL 40 MG PO CAPS: ORAL_CAPSULE | ORAL | Status: DC

## 2014-06-05 NOTE — Telephone Encounter (Signed)
Script called to [facility].

## 2014-06-05 NOTE — Telephone Encounter (Signed)
Patient of Dr Jacelyn Grip. Last seen in office on 02-13-14. Rx last filled on 05-03-14 for #40. Please advise. If approved please route to Pool A so nurse can phone in to pharmacy

## 2014-06-19 ENCOUNTER — Ambulatory Visit (INDEPENDENT_AMBULATORY_CARE_PROVIDER_SITE_OTHER): Payer: BC Managed Care – PPO | Admitting: Family

## 2014-06-19 ENCOUNTER — Encounter: Payer: Self-pay | Admitting: Family

## 2014-06-19 VITALS — BP 122/75 | HR 82 | Temp 97.4°F | Ht 65.0 in | Wt 231.2 lb

## 2014-06-19 DIAGNOSIS — G4700 Insomnia, unspecified: Secondary | ICD-10-CM

## 2014-06-19 DIAGNOSIS — E559 Vitamin D deficiency, unspecified: Secondary | ICD-10-CM

## 2014-06-19 DIAGNOSIS — E785 Hyperlipidemia, unspecified: Secondary | ICD-10-CM

## 2014-06-19 DIAGNOSIS — E669 Obesity, unspecified: Secondary | ICD-10-CM

## 2014-06-19 DIAGNOSIS — G2581 Restless legs syndrome: Secondary | ICD-10-CM

## 2014-06-19 DIAGNOSIS — F411 Generalized anxiety disorder: Secondary | ICD-10-CM

## 2014-06-19 DIAGNOSIS — R5381 Other malaise: Secondary | ICD-10-CM

## 2014-06-19 DIAGNOSIS — K2100 Gastro-esophageal reflux disease with esophagitis, without bleeding: Secondary | ICD-10-CM

## 2014-06-19 DIAGNOSIS — R21 Rash and other nonspecific skin eruption: Secondary | ICD-10-CM

## 2014-06-19 DIAGNOSIS — R5383 Other fatigue: Secondary | ICD-10-CM

## 2014-06-19 MED ORDER — SIMVASTATIN 20 MG PO TABS: 20.0000 mg | ORAL_TABLET | Freq: Every day | ORAL | Status: DC

## 2014-06-19 MED ORDER — CYCLOBENZAPRINE HCL 10 MG PO TABS: ORAL_TABLET | ORAL | Status: DC

## 2014-06-19 MED ORDER — TRAZODONE HCL 50 MG PO TABS: 25.0000 mg | ORAL_TABLET | Freq: Every evening | ORAL | Status: DC | PRN

## 2014-06-19 MED ORDER — PHENTERMINE HCL 15 MG PO CAPS: 15.0000 mg | ORAL_CAPSULE | ORAL | Status: DC

## 2014-06-19 MED ORDER — TRIAMCINOLONE ACETONIDE 0.025 % EX OINT: 1.0000 "application " | TOPICAL_OINTMENT | Freq: Two times a day (BID) | CUTANEOUS | Status: DC

## 2014-06-19 MED ORDER — FLUOXETINE HCL 40 MG PO CAPS: ORAL_CAPSULE | ORAL | Status: DC

## 2014-06-19 NOTE — Progress Notes (Signed)
Subjective:    Patient ID: Lori Cooper, female    DOB: February 26, 1956, 58 y.o.   MRN: [id number]  Hyperlipidemia This is a chronic problem. The current episode started more than 1 year ago. The problem is uncontrolled. Recent lipid tests were reviewed and are high. She has no history of diabetes or hypothyroidism. Factors aggravating her hyperlipidemia include fatty foods. Pertinent negatives include no leg pain, myalgias or shortness of breath. Current antihyperlipidemic treatment includes statins. The current treatment provides moderate improvement of lipids. Risk factors for coronary artery disease include dyslipidemia, post-menopausal, obesity and family history.  Anxiety Presents for follow-up visit. Symptoms include depressed mood. Patient reports no excessive worry, insomnia, irritability, nervous/anxious behavior, palpitations, panic, restlessness or shortness of breath. Symptoms occur rarely. The severity of symptoms is mild.   Her past medical history is significant for anxiety/panic attacks and depression. Past treatments include SSRIs and benzodiazephines. The treatment provided significant relief. Compliance with prior treatments has been good.  Gastrophageal Reflux She complains of heartburn. She reports no belching, no choking, no coughing or no sore throat. This is a chronic problem. The current episode started more than 1 year ago. The problem occurs occasionally. The problem has been waxing and waning. The heartburn is located in the substernum. The heartburn is of moderate intensity. The symptoms are aggravated by certain foods, caffeine and medications. She has tried a PPI for the symptoms. The treatment provided moderate relief.      Review of Systems  Constitutional: Negative.  Negative for irritability.  HENT: Negative.  Negative for sore throat.   Eyes: Negative.   Respiratory: Negative.  Negative for cough, choking and shortness of breath.   Cardiovascular: Negative.   Negative for palpitations.  Gastrointestinal: Positive for heartburn.  Endocrine: Negative.   Genitourinary: Negative.   Musculoskeletal: Negative.  Negative for myalgias.  Skin: Positive for rash.  Neurological: Negative.  Negative for headaches.  Hematological: Negative.   Psychiatric/Behavioral: Negative.  The patient is not nervous/anxious and does not have insomnia.   All other systems reviewed and are negative.      Objective:   Physical Exam  Vitals reviewed. Constitutional: She is oriented to person, place, and time. She appears well-developed and well-nourished. No distress.  HENT:  Head: Normocephalic and atraumatic.  Right Ear: External ear normal.  Mouth/Throat: Oropharynx is clear and moist.  Eyes: Pupils are equal, round, and reactive to light.  Neck: Normal range of motion. Neck supple. No thyromegaly present.  Cardiovascular: Normal rate, regular rhythm, normal heart sounds and intact distal pulses.   No murmur heard. Pulmonary/Chest: Effort normal and breath sounds normal. No respiratory distress. She has no wheezes.  Abdominal: Soft. Bowel sounds are normal. She exhibits no distension. There is no tenderness.  Musculoskeletal: Normal range of motion. She exhibits no edema and no tenderness.  Neurological: She is alert and oriented to person, place, and time. She has normal reflexes. No cranial nerve deficit.  Skin: Skin is warm and dry. Rash noted.  Small erythemas blisters scattered throughout body  Psychiatric: She has a normal mood and affect. Her behavior is normal. Judgment and thought content normal.      BP 122/75  Pulse 82  Temp(Src) 97.4 F (36.3 C) (Oral)  Ht _0  (1.651 m)  Wt 231 lb 3.2 oz (104.872 kg)  BMI 38.47 kg/m2     Assessment & Plan:  1. Gastroesophageal reflux disease with esophagitis - CMP14+EGFR  2. Generalized anxiety disorder - CMP14+EGFR - FLUoxetine (PROZAC)  40 MG capsule; TAKE TWO CAPSULES BY MOUTH ONCE DAILY  Dispense:  180 capsule; Refill: 0  3. HLD (hyperlipidemia)  - CMP14+EGFR - Lipid panel - simvastatin (ZOCOR) 20 MG tablet; Take 1 tablet (20 mg total) by mouth daily.  Dispense: 30 tablet; Refill: 2  4. Unspecified vitamin D deficiency  - CMP14+EGFR  5. Other malaise and fatigue  - CMP14+EGFR - Vit D  25 hydroxy (rtn osteoporosis monitoring) - Anemia Profile B  6. Restless legs  - cyclobenzaprine (FLEXERIL) 10 MG tablet; TAKE ONE TABLET BY MOUTH ONCE DAILY AS NEEDED FOR MUSCLE SPASM  Dispense: 30 tablet; Refill: 2  7. Rash and nonspecific skin eruption  - triamcinolone (KENALOG) 0.025 % ointment; Apply 1 application topically 2 (two) times daily.  Dispense: 30 g; Refill: 0  8. Obese - phentermine 15 MG capsule; Take 1 capsule (15 mg total) by mouth every morning.  Dispense: 30 capsule; Refill: 0  9. Insomnia  - traZODone (DESYREL) 50 MG tablet; Take 0.5-1 tablets (25-50 mg total) by mouth at bedtime as needed for sleep.  Dispense: 30 tablet; Refill: 3   Continue all meds Labs pending Health Maintenance reviewed Diet and exercise encouraged RTO 2 weeks to discuss weight loss, f/u in 4 months  Evelina Dun, FNP

## 2014-06-19 NOTE — Patient Instructions (Signed)
Health Maintenance Adopting a healthy lifestyle and getting preventive care can go a long way to promote health and wellness. Talk with your health care provider about what schedule of regular examinations is right for you. This is a good chance for you to check in with your provider about disease prevention and staying healthy. In between checkups, there are plenty of things you can do on your own. Experts have done a lot of research about which lifestyle changes and preventive measures are most likely to keep you healthy. Ask your health care provider for more information. WEIGHT AND DIET  Eat a healthy diet  Be sure to include plenty of vegetables, fruits, low-fat dairy products, and lean protein.  Do not eat a lot of foods high in solid fats, added sugars, or salt.  Get regular exercise. This is one of the most important things you can do for your health.  Most adults should exercise for at least 150 minutes each week. The exercise should increase your heart rate and make you sweat (moderate-intensity exercise).  Most adults should also do strengthening exercises at least twice a week. This is in addition to the moderate-intensity exercise.  Maintain a healthy weight  Body mass index (BMI) is a measurement that can be used to identify possible weight problems. It estimates body fat based on height and weight. Your health care provider can help determine your BMI and help you achieve or maintain a healthy weight.  For females 25 years of age and older:   A BMI below 18.5 is considered underweight.  A BMI of 18.5 to 24.9 is normal.  A BMI of 25 to 29.9 is considered overweight.  A BMI of 30 and above is considered obese.  Watch levels of cholesterol and blood lipids  You should start having your blood tested for lipids and cholesterol at 59 years of age, then have this test every 5 years.  You may need to have your cholesterol levels checked more often if:  Your lipid or  cholesterol levels are high.  You are older than 60 years of age.  You are at high risk for heart disease.  CANCER SCREENING   Lung Cancer  Lung cancer screening is recommended for adults 97-92 years old who are at high risk for lung cancer because of a history of smoking.  A yearly low-dose CT scan of the lungs is recommended for people who:  Currently smoke.  Have quit within the past 15 years.  Have at least a 30-pack-year history of smoking. A pack year is smoking an average of one pack of cigarettes a day for 1 year.  Yearly screening should continue until it has been 15 years since you quit.  Yearly screening should stop if you develop a health problem that would prevent you from having lung cancer treatment.  Breast Cancer  Practice breast self-awareness. This means understanding how your breasts normally appear and feel.  It also means doing regular breast self-exams. Let your health care provider know about any changes, no matter how small.  If you are in your 20s or 30s, you should have a clinical breast exam (CBE) by a health care provider every 1-3 years as part of a regular health exam.  If you are 76 or older, have a CBE every year. Also consider having a breast X-ray (mammogram) every year.  If you have a family history of breast cancer, talk to your health care provider about genetic screening.  If you are  at high risk for breast cancer, talk to your health care provider about having an MRI and a mammogram every year.  Breast cancer gene (BRCA) assessment is recommended for women who have family members with BRCA-related cancers. BRCA-related cancers include:  Breast.  Ovarian.  Tubal.  Peritoneal cancers.  Results of the assessment will determine the need for genetic counseling and BRCA1 and BRCA2 testing. Cervical Cancer Routine pelvic examinations to screen for cervical cancer are no longer recommended for nonpregnant women who are considered low  risk for cancer of the pelvic organs (ovaries, uterus, and vagina) and who do not have symptoms. A pelvic examination may be necessary if you have symptoms including those associated with pelvic infections. Ask your health care provider if a screening pelvic exam is right for you.   The Pap test is the screening test for cervical cancer for women who are considered at risk.  If you had a hysterectomy for a problem that was not cancer or a condition that could lead to cancer, then you no longer need Pap tests.  If you are older than 65 years, and you have had normal Pap tests for the past 10 years, you no longer need to have Pap tests.  If you have had past treatment for cervical cancer or a condition that could lead to cancer, you need Pap tests and screening for cancer for at least 20 years after your treatment.  If you no longer get a Pap test, assess your risk factors if they change (such as having a new sexual partner). This can affect whether you should start being screened again.  Some women have medical problems that increase their chance of getting cervical cancer. If this is the case for you, your health care provider may recommend more frequent screening and Pap tests.  The human papillomavirus (HPV) test is another test that may be used for cervical cancer screening. The HPV test looks for the virus that can cause cell changes in the cervix. The cells collected during the Pap test can be tested for HPV.  The HPV test can be used to screen women 30 years of age and older. Getting tested for HPV can extend the interval between normal Pap tests from three to five years.  An HPV test also should be used to screen women of any age who have unclear Pap test results.  After 59 years of age, women should have HPV testing as often as Pap tests.  Colorectal Cancer  This type of cancer can be detected and often prevented.  Routine colorectal cancer screening usually begins at 59 years of  age and continues through 59 years of age.  Your health care provider may recommend screening at an earlier age if you have risk factors for colon cancer.  Your health care provider may also recommend using home test kits to check for hidden blood in the stool.  A small camera at the end of a tube can be used to examine your colon directly (sigmoidoscopy or colonoscopy). This is done to check for the earliest forms of colorectal cancer.  Routine screening usually begins at age 50.  Direct examination of the colon should be repeated every 5-10 years through 59 years of age. However, you may need to be screened more often if early forms of precancerous polyps or small growths are found. Skin Cancer  Check your skin from head to toe regularly.  Tell your health care provider about any new moles or changes in   moles, especially if there is a change in a mole's shape or color.  Also tell your health care provider if you have a mole that is larger than the size of a pencil eraser.  Always use sunscreen. Apply sunscreen liberally and repeatedly throughout the day.  Protect yourself by wearing long sleeves, pants, a wide-brimmed hat, and sunglasses whenever you are outside. HEART DISEASE, DIABETES, AND HIGH BLOOD PRESSURE   Have your blood pressure checked at least every 1-2 years. High blood pressure causes heart disease and increases the risk of stroke.  If you are between 75 years and 42 years old, ask your health care provider if you should take aspirin to prevent strokes.  Have regular diabetes screenings. This involves taking a blood sample to check your fasting blood sugar level.  If you are at a normal weight and have a low risk for diabetes, have this test once every three years after 59 years of age.  If you are overweight and have a high risk for diabetes, consider being tested at a younger age or more often. PREVENTING INFECTION  Hepatitis B  If you have a higher risk for  hepatitis B, you should be screened for this virus. You are considered at high risk for hepatitis B if:  You were born in a country where hepatitis B is common. Ask your health care provider which countries are considered high risk.  Your parents were born in a high-risk country, and you have not been immunized against hepatitis B (hepatitis B vaccine).  You have HIV or AIDS.  You use needles to inject street drugs.  You live with someone who has hepatitis B.  You have had sex with someone who has hepatitis B.  You get hemodialysis treatment.  You take certain medicines for conditions, including cancer, organ transplantation, and autoimmune conditions. Hepatitis C  Blood testing is recommended for:  Everyone born from [birth year] through 1966.  Anyone with known risk factors for hepatitis C. Sexually transmitted infections (STIs)  You should be screened for sexually transmitted infections (STIs) including gonorrhea and chlamydia if:  You are sexually active and are younger than 59 years of age.  You are older than 59 years of age and your health care provider tells you that you are at risk for this type of infection.  Your sexual activity has changed since you were last screened and you are at an increased risk for chlamydia or gonorrhea. Ask your health care provider if you are at risk.  If you do not have HIV, but are at risk, it may be recommended that you take a prescription medicine daily to prevent HIV infection. This is called pre-exposure prophylaxis (PrEP). You are considered at risk if:  You are sexually active and do not regularly use condoms or know the HIV status of your partner(s).  You take drugs by injection.  You are sexually active with a partner who has HIV. Talk with your health care provider about whether you are at high risk of being infected with HIV. If you choose to begin PrEP, you should first be tested for HIV. You should then be tested every 3 months for  as long as you are taking PrEP.  PREGNANCY   If you are premenopausal and you may become pregnant, ask your health care provider about preconception counseling.  If you may become pregnant, take 400 to 800 micrograms (mcg) of folic acid every day.  If you want to prevent pregnancy, talk to your  health care provider about birth control (contraception). OSTEOPOROSIS AND MENOPAUSE   Osteoporosis is a disease in which the bones lose minerals and strength with aging. This can result in serious bone fractures. Your risk for osteoporosis can be identified using a bone density scan.  If you are 88 years of age or older, or if you are at risk for osteoporosis and fractures, ask your health care provider if you should be screened.  Ask your health care provider whether you should take a calcium or vitamin D supplement to lower your risk for osteoporosis.  Menopause may have certain physical symptoms and risks.  Hormone replacement therapy may reduce some of these symptoms and risks. Talk to your health care provider about whether hormone replacement therapy is right for you.  HOME CARE INSTRUCTIONS   Schedule regular health, dental, and eye exams.  Stay current with your immunizations.   Do not use any tobacco products including cigarettes, chewing tobacco, or electronic cigarettes.  If you are pregnant, do not drink alcohol.  If you are breastfeeding, limit how much and how often you drink alcohol.  Limit alcohol intake to no more than 1 drink per day for nonpregnant women. One drink equals 12 ounces of beer, 5 ounces of wine, or 1 ounces of hard liquor.  Do not use street drugs.  Do not share needles.  Ask your health care provider for help if you need support or information about quitting drugs.  Tell your health care provider if you often feel depressed.  Tell your health care provider if you have ever been abused or do not feel safe at home. Document Released: 05/18/2011  Document Revised: 03/19/2014 Document Reviewed: 10/04/2013 [facility] Patient Information 2015 [place], [place]. This information is not intended to replace advice given to you by your health care provider. Make sure you discuss any questions you have with your health care provider. Exercise to Lose Weight Exercise and a healthy diet may help you lose weight. Your doctor may suggest specific exercises. EXERCISE IDEAS AND TIPS  Choose low-cost things you enjoy doing, such as walking, bicycling, or exercising to workout videos.  Take stairs instead of the elevator.  Walk during your lunch break.  Park your car further away from work or school.  Go to a gym or an exercise class.  Start with 5 to 10 minutes of exercise each day. Build up to 30 minutes of exercise 4 to 6 days a week.  Wear shoes with good support and comfortable clothes.  Stretch before and after working out.  Work out until you breathe harder and your heart beats faster.  Drink extra water when you exercise.  Do not do so much that you hurt yourself, feel dizzy, or get very short of breath. Exercises that burn about 150 calories:  Running 1  miles in 15 minutes.  Playing volleyball for 45 to 60 minutes.  Washing and waxing a car for 45 to 60 minutes.  Playing touch football for 45 minutes.  Walking 1  miles in 35 minutes.  Pushing a stroller 1  miles in 30 minutes.  Playing basketball for 30 minutes.  Raking leaves for 30 minutes.  Bicycling 5 miles in 30 minutes.  Walking 2 miles in 30 minutes.  Dancing for 30 minutes.  Shoveling snow for 15 minutes.  Swimming laps for 20 minutes.  Walking up stairs for 15 minutes.  Bicycling 4 miles in 15 minutes.  Gardening for 30 to 45 minutes.  Jumping rope for  15 minutes.  Washing windows or floors for 45 to 60 minutes. Document Released: 12/05/2010 Document Revised: 01/25/2012 Document Reviewed: 12/05/2010 [facility] Patient Information 2015  [place], [place]. This information is not intended to replace advice given to you by your health care provider. Make sure you discuss any questions you have with your health care provider.

## 2014-06-20 ENCOUNTER — Other Ambulatory Visit: Payer: Self-pay | Admitting: Family

## 2014-06-20 LAB — ANEMIA PROFILE B
Basophils Absolute: 0.1 10*3/uL (ref 0.0–0.2)
Basos: 1 %
Eos: 8 %
Eosinophils Absolute: 0.5 10*3/uL — ABNORMAL HIGH (ref 0.0–0.4)
Ferritin: 59 ng/mL (ref 15–150)
Folate: >19.9 ng/mL (ref >3.0)
HCT: 40.1 % (ref 34.0–46.6)
Hemoglobin: 13.1 g/dL (ref 11.1–15.9)
Immature Grans (Abs): 0 10*3/uL (ref 0.0–0.1)
Immature Granulocytes: 0 %
Iron Saturation: 32 % (ref 15–55)
Iron: 92 ug/dL (ref 35–155)
Lymphocytes Absolute: 2.5 10*3/uL (ref 0.7–3.1)
Lymphs: 43 %
MCH: 29.8 pg (ref 26.6–33.0)
MCHC: 32.7 g/dL (ref 31.5–35.7)
MCV: 91 fL (ref 79–97)
Monocytes Absolute: 0.4 10*3/uL (ref 0.1–0.9)
Monocytes: 7 %
Neutrophils Absolute: 2.4 10*3/uL (ref 1.4–7.0)
Neutrophils Relative %: 41 %
Platelets: 336 10*3/uL (ref 150–379)
RBC: 4.39 x10E6/uL (ref 3.77–5.28)
RDW: 13 % (ref 12.3–15.4)
Retic Ct Pct: 1.1 % (ref 0.6–2.6)
TIBC: 291 ug/dL (ref 250–450)
UIBC: 199 ug/dL (ref 150–375)
Vitamin B-12: 391 pg/mL (ref 211–946)
WBC: 5.9 10*3/uL (ref 3.4–10.8)

## 2014-06-20 LAB — CMP14+EGFR
ALT: 19 IU/L (ref 0–32)
AST: 19 IU/L (ref 0–40)
Albumin/Globulin Ratio: 1.9 (ref 1.1–2.5)
Albumin: 4.2 g/dL (ref 3.5–5.5)
Alkaline Phosphatase: 92 IU/L (ref 39–117)
BUN/Creatinine Ratio: 13 (ref 9–23)
BUN: 10 mg/dL (ref 6–24)
CO2: 23 mmol/L (ref 18–29)
Calcium: 9.3 mg/dL (ref 8.7–10.2)
Chloride: 102 mmol/L (ref 97–108)
Creatinine, Ser: 0.76 mg/dL (ref 0.57–1.00)
GFR calc Af Amer: 99 mL/min/{1.73_m2} (ref >59)
GFR calc non Af Amer: 86 mL/min/{1.73_m2} (ref >59)
Globulin, Total: 2.2 g/dL (ref 1.5–4.5)
Glucose: 101 mg/dL — ABNORMAL HIGH (ref 65–99)
Potassium: 4.1 mmol/L (ref 3.5–5.2)
Sodium: 139 mmol/L (ref 134–144)
Total Bilirubin: 0.4 mg/dL (ref 0.0–1.2)
Total Protein: 6.4 g/dL (ref 6.0–8.5)

## 2014-06-20 LAB — LIPID PANEL
Chol/HDL Ratio: 4.8 ratio units — ABNORMAL HIGH (ref 0.0–4.4)
Cholesterol, Total: 237 mg/dL — ABNORMAL HIGH (ref 100–199)
HDL: 49 mg/dL (ref >39)
LDL Calculated: 141 mg/dL — ABNORMAL HIGH (ref 0–99)
Triglycerides: 233 mg/dL — ABNORMAL HIGH (ref 0–149)
VLDL Cholesterol Cal: 47 mg/dL — ABNORMAL HIGH (ref 5–40)

## 2014-06-20 LAB — VITAMIN D 25 HYDROXY (VIT D DEFICIENCY, FRACTURES): Vit D, 25-Hydroxy: 16.6 ng/mL — ABNORMAL LOW (ref 30.0–100.0)

## 2014-06-20 MED ORDER — ATORVASTATIN CALCIUM 40 MG PO TABS: 40.0000 mg | ORAL_TABLET | Freq: Every day | ORAL | Status: DC

## 2014-06-20 MED ORDER — VITAMIN D (ERGOCALCIFEROL) 1.25 MG (50000 UNIT) PO CAPS
50000.0000 [IU] | ORAL_CAPSULE | ORAL | Status: DC
Start: 2014-06-20 — End: 2014-12-21

## 2014-06-21 ENCOUNTER — Telehealth: Payer: Self-pay | Admitting: Family

## 2014-06-21 MED ORDER — SIMVASTATIN 40 MG PO TABS: 40.0000 mg | ORAL_TABLET | Freq: Every day | ORAL | Status: DC

## 2014-06-21 NOTE — Telephone Encounter (Signed)
RX sent of simvastatin to pharmacy per pt's request

## 2014-07-05 ENCOUNTER — Other Ambulatory Visit: Payer: Self-pay | Admitting: Family

## 2014-07-06 NOTE — Telephone Encounter (Signed)
Patient last seen in office on 06-19-14. Rx last filled on 06-05-14 for #40. Please advise. If approved please route to pool A so nurse can phone in to pharmacy

## 2014-07-06 NOTE — Telephone Encounter (Signed)
rx called into pharmacy

## 2014-07-12 ENCOUNTER — Other Ambulatory Visit: Payer: Self-pay | Admitting: *Deleted

## 2014-07-12 ENCOUNTER — Ambulatory Visit (INDEPENDENT_AMBULATORY_CARE_PROVIDER_SITE_OTHER): Payer: BC Managed Care – PPO | Admitting: Family

## 2014-07-12 VITALS — BP 123/69 | HR 82 | Temp 99.8°F | Ht 65.0 in | Wt 221.0 lb

## 2014-07-12 DIAGNOSIS — E669 Obesity, unspecified: Secondary | ICD-10-CM

## 2014-07-12 DIAGNOSIS — R635 Abnormal weight gain: Secondary | ICD-10-CM

## 2014-07-12 DIAGNOSIS — Z713 Dietary counseling and surveillance: Secondary | ICD-10-CM

## 2014-07-12 MED ORDER — PHENTERMINE HCL 37.5 MG PO CAPS: 37.5000 mg | ORAL_CAPSULE | ORAL | Status: DC

## 2014-07-12 MED ORDER — PHENTERMINE HCL 15 MG PO CAPS: 30.0000 mg | ORAL_CAPSULE | ORAL | Status: DC

## 2014-07-12 MED ORDER — OMEPRAZOLE 20 MG PO CPDR: 20.0000 mg | DELAYED_RELEASE_CAPSULE | Freq: Every day | ORAL | Status: DC

## 2014-07-12 NOTE — Progress Notes (Signed)
   Subjective:    Patient ID: Lori Cooper, female    DOB: 07/13/1956, 58 y.o.   MRN: [id number]  HPI Pt presents to office for follow-up on weight loss. Pt was started on phentermine 15mg  PO daily. Since last visit pt has lost 10 lbs since last visit. Pt denies any pain, N&V, SOB, edema, or complaints at this time.    Review of Systems  Constitutional: Negative.   HENT: Negative.   Eyes: Negative.   Respiratory: Negative.  Negative for shortness of breath.   Cardiovascular: Negative.  Negative for palpitations.  Gastrointestinal: Negative.   Endocrine: Negative.   Genitourinary: Negative.   Musculoskeletal: Negative.   Neurological: Negative.  Negative for headaches.  Hematological: Negative.   Psychiatric/Behavioral: Negative.   All other systems reviewed and are negative.      Objective:   Physical Exam  Vitals reviewed. Constitutional: She is oriented to person, place, and time. She appears well-developed and well-nourished. No distress.  HENT:  Head: Normocephalic and atraumatic.  Right Ear: External ear normal.  Mouth/Throat: Oropharynx is clear and moist.  Eyes: Pupils are equal, round, and reactive to light.  Neck: Normal range of motion. Neck supple. No thyromegaly present.  Cardiovascular: Normal rate, regular rhythm, normal heart sounds and intact distal pulses.   No murmur heard. Pulmonary/Chest: Effort normal and breath sounds normal. No respiratory distress. She has no wheezes.  Abdominal: Soft. Bowel sounds are normal. She exhibits no distension. There is no tenderness.  Musculoskeletal: Normal range of motion. She exhibits no edema and no tenderness.  Neurological: She is alert and oriented to person, place, and time. She has normal reflexes. No cranial nerve deficit.  Skin: Skin is warm and dry.  Psychiatric: She has a normal mood and affect. Her behavior is normal. Judgment and thought content normal.      BP 123/69  Pulse 82  Temp(Src) 99.8 F (37.7  C) (Oral)  Ht 5\' 5"  (1.651 m)  Wt 221 lb (100.245 kg)  BMI 36.78 kg/m2     Assessment & Plan:  1. Weight loss counseling, encounter for -Encourage diet and exercise -Report any s/s of medication - phentermine 15 MG capsule; Take 2 capsules (30 mg total) by mouth every morning.  Dispense: 60 capsule; Refill: 2  2. Obese -Low calorie diet and exercise - phentermine 15 MG capsule; Take 2 capsules (30 mg total) by mouth every morning.  Dispense: 60 capsule; Refill: 2 -RTO to recheck weight loss in 4 weeks (pt going out of town to visit son)  Evelina Dun, [place]

## 2014-07-12 NOTE — Addendum Note (Signed)
Addended by: Evelina Dun A on: 07/12/2014 11:45 AM   Modules accepted: Orders, Medications

## 2014-07-12 NOTE — Patient Instructions (Signed)
Exercise to Lose Weight Exercise and a healthy diet may help you lose weight. Your doctor may suggest specific exercises. EXERCISE IDEAS AND TIPS  Choose low-cost things you enjoy doing, such as walking, bicycling, or exercising to workout videos.  Take stairs instead of the elevator.  Walk during your lunch break.  Park your car further away from work or school.  Go to a gym or an exercise class.  Start with 5 to 10 minutes of exercise each day. Build up to 30 minutes of exercise 4 to 6 days a week.  Wear shoes with good support and comfortable clothes.  Stretch before and after working out.  Work out until you breathe harder and your heart beats faster.  Drink extra water when you exercise.  Do not do so much that you hurt yourself, feel dizzy, or get very short of breath. Exercises that burn about 150 calories:  Running 1  miles in 15 minutes.  Playing volleyball for 45 to 60 minutes.  Washing and waxing a car for 45 to 60 minutes.  Playing touch football for 45 minutes.  Walking 1  miles in 35 minutes.  Pushing a stroller 1  miles in 30 minutes.  Playing basketball for 30 minutes.  Raking leaves for 30 minutes.  Bicycling 5 miles in 30 minutes.  Walking 2 miles in 30 minutes.  Dancing for 30 minutes.  Shoveling snow for 15 minutes.  Swimming laps for 20 minutes.  Walking up stairs for 15 minutes.  Bicycling 4 miles in 15 minutes.  Gardening for 30 to 45 minutes.  Jumping rope for 15 minutes.  Washing windows or floors for 45 to 60 minutes. Document Released: 12/05/2010 Document Revised: 01/25/2012 Document Reviewed: 12/05/2010 [employer] Patient Information 2015 [employer]. This information is not intended to replace advice given to you by your health care provider. Make sure you discuss any questions you have with your health care provider.  

## 2014-08-06 ENCOUNTER — Other Ambulatory Visit: Payer: Self-pay | Admitting: Family

## 2014-08-07 NOTE — Telephone Encounter (Signed)
Last ov 07/12/14. Last refill 07/06/14. Route to nurse pool. If approved call to [facility].

## 2014-08-08 NOTE — Telephone Encounter (Signed)
Called to pharmacy 

## 2014-08-17 ENCOUNTER — Encounter: Payer: Self-pay | Admitting: Family

## 2014-08-17 ENCOUNTER — Ambulatory Visit (INDEPENDENT_AMBULATORY_CARE_PROVIDER_SITE_OTHER): Payer: BC Managed Care – PPO | Admitting: Family

## 2014-08-17 VITALS — BP 124/73 | HR 74 | Temp 98.7°F | Ht 65.0 in | Wt 217.0 lb

## 2014-08-17 DIAGNOSIS — Z23 Encounter for immunization: Secondary | ICD-10-CM

## 2014-08-17 DIAGNOSIS — R635 Abnormal weight gain: Secondary | ICD-10-CM

## 2014-08-17 DIAGNOSIS — E669 Obesity, unspecified: Secondary | ICD-10-CM

## 2014-08-17 DIAGNOSIS — Z713 Dietary counseling and surveillance: Secondary | ICD-10-CM

## 2014-08-17 MED ORDER — PHENTERMINE HCL 37.5 MG PO TABS: 37.5000 mg | ORAL_TABLET | Freq: Every day | ORAL | Status: DC

## 2014-08-17 NOTE — Progress Notes (Signed)
   Subjective:    Patient ID: Lori Cooper, female    DOB: [birth year], 58 y.o.   MRN: [id number]  HPI Pt presents to the office for follow-up on weight loss. Since last visit pt has lost 4lbs. Pt states since starting she has lost 22 lbs. Pt states she is on a low carb diet.    Review of Systems  Constitutional: Negative.   HENT: Negative.   Eyes: Negative.   Respiratory: Negative.  Negative for shortness of breath.   Cardiovascular: Negative.  Negative for palpitations.  Gastrointestinal: Negative.   Endocrine: Negative.   Genitourinary: Negative.   Musculoskeletal: Negative.   Neurological: Negative.  Negative for headaches.  Hematological: Negative.   Psychiatric/Behavioral: Negative.   All other systems reviewed and are negative.      Objective:   Physical Exam  Vitals reviewed. Constitutional: She is oriented to person, place, and time. She appears well-developed and well-nourished. No distress.  Cardiovascular: Normal rate, regular rhythm, normal heart sounds and intact distal pulses.   No murmur heard. Pulmonary/Chest: Effort normal and breath sounds normal. No respiratory distress. She has no wheezes.  Abdominal: Soft. Bowel sounds are normal. She exhibits no distension. There is no tenderness.  Musculoskeletal: Normal range of motion. She exhibits no edema and no tenderness.  Neurological: She is alert and oriented to person, place, and time. She has normal reflexes. No cranial nerve deficit.  Skin: Skin is warm and dry.  Psychiatric: She has a normal mood and affect. Her behavior is normal. Judgment and thought content normal.      Blood pressure 124/73, pulse 74, temperature 98.7 F (37.1 C), temperature source Oral, height 5\' 5"  (1.651 m), weight 217 lb (98.431 kg).     Assessment & Plan:  1. Obesity  2. Weight loss counseling, encounter for   Continue all meds Health Maintenance reviewed-Flu shot given today Diet and exercise encouraged- Exercising  disccussed RTO prn  Evelina Dun, FNP

## 2014-08-17 NOTE — Patient Instructions (Signed)
Exercise to Lose Weight Exercise and a healthy diet may help you lose weight. Your doctor may suggest specific exercises. EXERCISE IDEAS AND TIPS  Choose low-cost things you enjoy doing, such as walking, bicycling, or exercising to workout videos.  Take stairs instead of the elevator.  Walk during your lunch break.  Park your car further away from work or school.  Go to a gym or an exercise class.  Start with 5 to 10 minutes of exercise each day. Build up to 30 minutes of exercise 4 to 6 days a week.  Wear shoes with good support and comfortable clothes.  Stretch before and after working out.  Work out until you breathe harder and your heart beats faster.  Drink extra water when you exercise.  Do not do so much that you hurt yourself, feel dizzy, or get very short of breath. Exercises that burn about 150 calories:  Running 1  miles in 15 minutes.  Playing volleyball for 45 to 60 minutes.  Washing and waxing a car for 45 to 60 minutes.  Playing touch football for 45 minutes.  Walking 1  miles in 35 minutes.  Pushing a stroller 1  miles in 30 minutes.  Playing basketball for 30 minutes.  Raking leaves for 30 minutes.  Bicycling 5 miles in 30 minutes.  Walking 2 miles in 30 minutes.  Dancing for 30 minutes.  Shoveling snow for 15 minutes.  Swimming laps for 20 minutes.  Walking up stairs for 15 minutes.  Bicycling 4 miles in 15 minutes.  Gardening for 30 to 45 minutes.  Jumping rope for 15 minutes.  Washing windows or floors for 45 to 60 minutes. Document Released: 12/05/2010 Document Revised: 01/25/2012 Document Reviewed: 12/05/2010 [employer] Patient Information 2015 [employer]. This information is not intended to replace advice given to you by your health care provider. Make sure you discuss any questions you have with your health care provider.  

## 2014-08-21 ENCOUNTER — Ambulatory Visit: Payer: BC Managed Care – PPO

## 2014-09-04 ENCOUNTER — Other Ambulatory Visit: Payer: Self-pay | Admitting: Family

## 2014-09-06 NOTE — Telephone Encounter (Signed)
Called to [facility] in [place]

## 2014-09-06 NOTE — Telephone Encounter (Signed)
Patient of Lori Cooper. Last seen in office on 08-17-14. Rx last filled on 08-08-14 for #40. Please advise. If approved please route to Pool A so nurse can phone in to pharmacy

## 2014-09-24 ENCOUNTER — Other Ambulatory Visit: Payer: Self-pay | Admitting: Family

## 2014-10-05 ENCOUNTER — Other Ambulatory Visit: Payer: Self-pay | Admitting: Family Medicine

## 2014-10-05 NOTE — Telephone Encounter (Signed)
Please call in ativan with 1 refills 

## 2014-10-05 NOTE — Telephone Encounter (Signed)
Last filled 10/07/14, last seen 08/17/14. Christys pt. Uses [facility]

## 2014-10-08 NOTE — Telephone Encounter (Signed)
Called in At[facility] per MMM.

## 2014-12-01 ENCOUNTER — Other Ambulatory Visit: Payer: Self-pay | Admitting: Family Medicine

## 2014-12-01 ENCOUNTER — Other Ambulatory Visit: Payer: Self-pay | Admitting: Nurse Practitioner

## 2014-12-03 NOTE — Telephone Encounter (Signed)
rx called into pharmacy

## 2014-12-03 NOTE — Telephone Encounter (Signed)
Last seen 08/17/14, last filled 11/02/14. If approved, route to pool b, nurse call into [facility]

## 2014-12-21 ENCOUNTER — Encounter: Payer: Self-pay | Admitting: Family

## 2014-12-21 ENCOUNTER — Ambulatory Visit (INDEPENDENT_AMBULATORY_CARE_PROVIDER_SITE_OTHER): Payer: 59 | Admitting: Family

## 2014-12-21 VITALS — BP 119/75 | HR 82 | Temp 97.0°F | Ht 65.0 in | Wt 215.6 lb

## 2014-12-21 DIAGNOSIS — K2100 Gastro-esophageal reflux disease with esophagitis, without bleeding: Secondary | ICD-10-CM

## 2014-12-21 DIAGNOSIS — E559 Vitamin D deficiency, unspecified: Secondary | ICD-10-CM

## 2014-12-21 DIAGNOSIS — F411 Generalized anxiety disorder: Secondary | ICD-10-CM

## 2014-12-21 DIAGNOSIS — F329 Major depressive disorder, single episode, unspecified: Secondary | ICD-10-CM

## 2014-12-21 DIAGNOSIS — E669 Obesity, unspecified: Secondary | ICD-10-CM

## 2014-12-21 DIAGNOSIS — Z713 Dietary counseling and surveillance: Secondary | ICD-10-CM

## 2014-12-21 DIAGNOSIS — E785 Hyperlipidemia, unspecified: Secondary | ICD-10-CM

## 2014-12-21 DIAGNOSIS — F32A Depression, unspecified: Secondary | ICD-10-CM | POA: Insufficient documentation

## 2014-12-21 MED ORDER — PHENTERMINE HCL 37.5 MG PO TABS
37.5000 mg | ORAL_TABLET | Freq: Every day | ORAL | Status: DC
Start: 2014-12-21 — End: 2015-07-16

## 2014-12-21 MED ORDER — LORAZEPAM 0.5 MG PO TABS: 0.5000 mg | ORAL_TABLET | Freq: Three times a day (TID) | ORAL | Status: DC | PRN

## 2014-12-21 MED ORDER — FLUOXETINE HCL 40 MG PO CAPS: ORAL_CAPSULE | ORAL | Status: DC

## 2014-12-21 NOTE — Progress Notes (Signed)
Subjective:    Patient ID: Lori Cooper, female    DOB: 04-Mar-1956, 59 y.o.   MRN: [id number]  Hyperlipidemia This is a chronic problem. The current episode started more than 1 year ago. The problem is uncontrolled. Recent lipid tests were reviewed and are high. She has no history of diabetes or hypothyroidism. Factors aggravating her hyperlipidemia include fatty foods. Pertinent negatives include no leg pain, myalgias or shortness of breath. Current antihyperlipidemic treatment includes statins and diet change. The current treatment provides moderate improvement of lipids. Risk factors for coronary artery disease include dyslipidemia, post-menopausal, obesity and family history.  Anxiety Presents for follow-up visit. Symptoms include depressed mood and nervous/anxious behavior. Patient reports no compulsions, excessive worry, insomnia, irritability, muscle tension, palpitations, panic or shortness of breath. Symptoms occur rarely.   Her past medical history is significant for anxiety/panic attacks and depression. Past treatments include SSRIs and benzodiazephines. The treatment provided significant relief. Compliance with prior treatments has been good.  Gastrophageal Reflux She complains of heartburn. She reports no belching, no coughing or no sore throat. This is a chronic problem. The current episode started more than 1 year ago. The problem occurs frequently. The problem has been waxing and waning. The heartburn wakes her from sleep. The heartburn does not limit her activity. The heartburn changes with position. The symptoms are aggravated by certain foods and lying down. Pertinent negatives include no muscle weakness. She has tried a PPI for the symptoms. The treatment provided moderate relief.      Review of Systems  Constitutional: Negative.  Negative for irritability.  HENT: Negative.  Negative for sore throat.   Eyes: Negative.   Respiratory: Negative.  Negative for cough and shortness  of breath.   Cardiovascular: Negative.  Negative for palpitations.  Gastrointestinal: Positive for heartburn.  Endocrine: Negative.   Genitourinary: Negative.   Musculoskeletal: Negative.  Negative for myalgias and muscle weakness.  Neurological: Negative.  Negative for headaches.  Hematological: Negative.   Psychiatric/Behavioral: The patient is nervous/anxious. The patient does not have insomnia.   All other systems reviewed and are negative.      Objective:   Physical Exam  Constitutional: She is oriented to person, place, and time. She appears well-developed and well-nourished. No distress.  HENT:  Head: Normocephalic and atraumatic.  Right Ear: External ear normal.  Left Ear: External ear normal.  Nose: Nose normal.  Mouth/Throat: Oropharynx is clear and moist.  Eyes: Pupils are equal, round, and reactive to light.  Neck: Normal range of motion. Neck supple. No thyromegaly present.  Cardiovascular: Normal rate, regular rhythm, normal heart sounds and intact distal pulses.   No murmur heard. Pulmonary/Chest: Effort normal and breath sounds normal. No respiratory distress. She has no wheezes.  Abdominal: Soft. Bowel sounds are normal. She exhibits no distension. There is no tenderness.  Musculoskeletal: Normal range of motion. She exhibits no edema or tenderness.  Neurological: She is alert and oriented to person, place, and time. She has normal reflexes. No cranial nerve deficit.  Skin: Skin is warm and dry.  Psychiatric: She has a normal mood and affect. Her behavior is normal. Judgment and thought content normal.  Vitals reviewed.   BP 119/75 mmHg  Pulse 82  Temp(Src) 97 F (36.1 C) (Oral)  Ht 5' 5" (1.651 m)  Wt 215 lb 9.6 oz (97.796 kg)  BMI 35.88 kg/m2       Assessment & Plan:  1. Gastroesophageal reflux disease with esophagitis - CMP14+EGFR  2. Vitamin D deficiency -  CMP14+EGFR - Vit D  25 hydroxy (rtn osteoporosis monitoring)  3. HLD  (hyperlipidemia) - CMP14+EGFR - Lipid panel  4. Generalized anxiety disorder - CMP14+EGFR - FLUoxetine (PROZAC) 40 MG capsule; TAKE TWO CAPSULES BY MOUTH ONCE DAILY  Dispense: 180 capsule; Refill: 0 - LORazepam (ATIVAN) 0.5 MG tablet; Take 1 tablet (0.5 mg total) by mouth every 8 (eight) hours as needed.  Dispense: 60 tablet; Refill: 0  5. Depression  - CMP14+EGFR - FLUoxetine (PROZAC) 40 MG capsule; TAKE TWO CAPSULES BY MOUTH ONCE DAILY  Dispense: 180 capsule; Refill: 0  6. Obesity - phentermine (ADIPEX-P) 37.5 MG tablet; Take 1 tablet (37.5 mg total) by mouth daily before breakfast.  Dispense: 30 tablet; Refill: 3  7. Weight loss counseling, encounter for - phentermine (ADIPEX-P) 37.5 MG tablet; Take 1 tablet (37.5 mg total) by mouth daily before breakfast.  Dispense: 30 tablet; Refill: 3   Continue all meds Labs pending Health Maintenance reviewed Diet and exercise encouraged RTO 3 months   Evelina Dun, FNP

## 2014-12-21 NOTE — Patient Instructions (Signed)

## 2014-12-22 LAB — CMP14+EGFR
ALT: 19 IU/L (ref 0–32)
AST: 15 IU/L (ref 0–40)
Albumin/Globulin Ratio: 1.8 (ref 1.1–2.5)
Albumin: 4.2 g/dL (ref 3.6–4.8)
Alkaline Phosphatase: 79 IU/L (ref 39–117)
BUN/Creatinine Ratio: 29 — ABNORMAL HIGH (ref 11–26)
BUN: 18 mg/dL (ref 8–27)
CO2: 21 mmol/L (ref 18–29)
Calcium: 9.4 mg/dL (ref 8.7–10.3)
Chloride: 102 mmol/L (ref 97–108)
Creatinine, Ser: 0.62 mg/dL (ref 0.57–1.00)
GFR calc Af Amer: 113 mL/min/{1.73_m2} (ref >59)
GFR calc non Af Amer: 98 mL/min/{1.73_m2} (ref >59)
Globulin, Total: 2.3 g/dL (ref 1.5–4.5)
Glucose: 100 mg/dL — ABNORMAL HIGH (ref 65–99)
Potassium: 4.7 mmol/L (ref 3.5–5.2)
Sodium: 138 mmol/L (ref 134–144)
Total Bilirubin: 0.4 mg/dL (ref 0.0–1.2)
Total Protein: 6.5 g/dL (ref 6.0–8.5)

## 2014-12-22 LAB — LIPID PANEL
Chol/HDL Ratio: 3.3 ratio units (ref 0.0–4.4)
Cholesterol, Total: 200 mg/dL — ABNORMAL HIGH (ref 100–199)
HDL: 61 mg/dL (ref >39)
LDL Calculated: 116 mg/dL — ABNORMAL HIGH (ref 0–99)
Triglycerides: 115 mg/dL (ref 0–149)
VLDL Cholesterol Cal: 23 mg/dL (ref 5–40)

## 2014-12-22 LAB — VITAMIN D 25 HYDROXY (VIT D DEFICIENCY, FRACTURES): Vit D, 25-Hydroxy: 36.5 ng/mL (ref 30.0–100.0)

## 2014-12-24 ENCOUNTER — Other Ambulatory Visit: Payer: Self-pay | Admitting: Family

## 2014-12-24 MED ORDER — ATORVASTATIN CALCIUM 40 MG PO TABS: 40.0000 mg | ORAL_TABLET | Freq: Every day | ORAL | Status: DC

## 2014-12-26 ENCOUNTER — Telehealth: Payer: Self-pay | Admitting: *Deleted

## 2014-12-26 NOTE — Telephone Encounter (Signed)
lmtcb regarding test results. 

## 2014-12-26 NOTE — Telephone Encounter (Signed)
-----   Message from Sharion Balloon, [place] sent at 12/24/2014  8:48 AM EST ----- Kidney and liver function stable Cholesterol levels elevated- Stop Zocor and Lipitor Prescription sent to pharmacy  Vit D levels low- WNL

## 2014-12-31 ENCOUNTER — Telehealth: Payer: Self-pay | Admitting: *Deleted

## 2014-12-31 NOTE — Telephone Encounter (Signed)
-----   Message from Sharion Balloon, [place] sent at 12/24/2014  8:48 AM EST ----- Kidney and liver function stable Cholesterol levels elevated- Stop Zocor and Lipitor Prescription sent to pharmacy  Vit D levels low- WNL

## 2015-01-01 MED ORDER — FLUOXETINE HCL 40 MG PO CAPS: 80.0000 mg | ORAL_CAPSULE | Freq: Every day | ORAL | Status: DC

## 2015-01-01 NOTE — Telephone Encounter (Signed)
-----   Message from Sharion Balloon, [place] sent at 12/24/2014  8:48 AM EST ----- Kidney and liver function stable Cholesterol levels elevated- Stop Zocor and Lipitor Prescription sent to pharmacy  Vit D levels low- WNL

## 2015-01-03 ENCOUNTER — Other Ambulatory Visit: Payer: Self-pay | Admitting: Family

## 2015-01-08 ENCOUNTER — Encounter: Payer: Self-pay | Admitting: Gastroenterology

## 2015-01-30 ENCOUNTER — Other Ambulatory Visit: Payer: Self-pay | Admitting: Family

## 2015-01-30 NOTE — Telephone Encounter (Signed)
Last seen 12/21/14 Lori Cooper  If approved route to nurse to call into [facility]

## 2015-01-30 NOTE — Telephone Encounter (Signed)
rx called into pharmacy

## 2015-02-05 ENCOUNTER — Other Ambulatory Visit: Payer: Self-pay | Admitting: Family

## 2015-02-06 ENCOUNTER — Other Ambulatory Visit: Payer: Self-pay | Admitting: Family

## 2015-03-04 ENCOUNTER — Other Ambulatory Visit: Payer: Self-pay | Admitting: Nurse Practitioner

## 2015-04-05 ENCOUNTER — Encounter: Payer: Self-pay | Admitting: Family

## 2015-04-05 ENCOUNTER — Ambulatory Visit (INDEPENDENT_AMBULATORY_CARE_PROVIDER_SITE_OTHER): Payer: 59 | Admitting: Family

## 2015-04-05 VITALS — BP 113/77 | HR 65 | Temp 98.0°F | Ht 65.0 in | Wt 209.4 lb

## 2015-04-05 DIAGNOSIS — E785 Hyperlipidemia, unspecified: Secondary | ICD-10-CM | POA: Diagnosis not present

## 2015-04-05 DIAGNOSIS — E669 Obesity, unspecified: Secondary | ICD-10-CM | POA: Diagnosis not present

## 2015-04-05 DIAGNOSIS — F411 Generalized anxiety disorder: Secondary | ICD-10-CM

## 2015-04-05 DIAGNOSIS — F32A Depression, unspecified: Secondary | ICD-10-CM

## 2015-04-05 DIAGNOSIS — K2100 Gastro-esophageal reflux disease with esophagitis, without bleeding: Secondary | ICD-10-CM

## 2015-04-05 DIAGNOSIS — F329 Major depressive disorder, single episode, unspecified: Secondary | ICD-10-CM

## 2015-04-05 DIAGNOSIS — E559 Vitamin D deficiency, unspecified: Secondary | ICD-10-CM | POA: Diagnosis not present

## 2015-04-05 DIAGNOSIS — Z713 Dietary counseling and surveillance: Secondary | ICD-10-CM

## 2015-04-05 MED ORDER — FLUOXETINE HCL 40 MG PO CAPS: 80.0000 mg | ORAL_CAPSULE | Freq: Every day | ORAL | Status: DC

## 2015-04-05 NOTE — Patient Instructions (Signed)
Exercise to Lose Weight Exercise and a healthy diet may help you lose weight. Your doctor may suggest specific exercises. EXERCISE IDEAS AND TIPS  Choose low-cost things you enjoy doing, such as walking, bicycling, or exercising to workout videos.  Take stairs instead of the elevator.  Walk during your lunch break.  Park your car further away from work or school.  Go to a gym or an exercise class.  Start with 5 to 10 minutes of exercise each day. Build up to 30 minutes of exercise 4 to 6 days a week.  Wear shoes with good support and comfortable clothes.  Stretch before and after working out.  Work out until you breathe harder and your heart beats faster.  Drink extra water when you exercise.  Do not do so much that you hurt yourself, feel dizzy, or get very short of breath. Exercises that burn about 150 calories:  Running 1  miles in 15 minutes.  Playing volleyball for 45 to 60 minutes.  Washing and waxing a car for 45 to 60 minutes.  Playing touch football for 45 minutes.  Walking 1  miles in 35 minutes.  Pushing a stroller 1  miles in 30 minutes.  Playing basketball for 30 minutes.  Raking leaves for 30 minutes.  Bicycling 5 miles in 30 minutes.  Walking 2 miles in 30 minutes.  Dancing for 30 minutes.  Shoveling snow for 15 minutes.  Swimming laps for 20 minutes.  Walking up stairs for 15 minutes.  Bicycling 4 miles in 15 minutes.  Gardening for 30 to 45 minutes.  Jumping rope for 15 minutes.  Washing windows or floors for 45 to 60 minutes. Document Released: 12/05/2010 Document Revised: 01/25/2012 Document Reviewed: 12/05/2010 [facility] Patient Information 2015 [place], [place]. This information is not intended to replace advice given to you by your health care provider. Make sure you discuss any questions you have with your health care provider. Health Maintenance Adopting a healthy lifestyle and getting preventive care can go a long way to  promote health and wellness. Talk with your health care provider about what schedule of regular examinations is right for you. This is a good chance for you to check in with your provider about disease prevention and staying healthy. In between checkups, there are plenty of things you can do on your own. Experts have done a lot of research about which lifestyle changes and preventive measures are most likely to keep you healthy. Ask your health care provider for more information. WEIGHT AND DIET  Eat a healthy diet  Be sure to include plenty of vegetables, fruits, low-fat dairy products, and lean protein.  Do not eat a lot of foods high in solid fats, added sugars, or salt.  Get regular exercise. This is one of the most important things you can do for your health.  Most adults should exercise for at least 150 minutes each week. The exercise should increase your heart rate and make you sweat (moderate-intensity exercise).  Most adults should also do strengthening exercises at least twice a week. This is in addition to the moderate-intensity exercise.  Maintain a healthy weight  Body mass index (BMI) is a measurement that can be used to identify possible weight problems. It estimates body fat based on height and weight. Your health care provider can help determine your BMI and help you achieve or maintain a healthy weight.  For females 30 years of age and older:   A BMI below 18.5 is considered underweight.  A BMI of 18.5 to 24.9 is normal.  A BMI of 25 to 29.9 is considered overweight.  A BMI of 30 and above is considered obese.  Watch levels of cholesterol and blood lipids  You should start having your blood tested for lipids and cholesterol at 60 years of age, then have this test every 5 years.  You may need to have your cholesterol levels checked more often if:  Your lipid or cholesterol levels are high.  You are older than 60 years of age.  You are at high risk for heart  disease.  CANCER SCREENING   Lung Cancer  Lung cancer screening is recommended for adults 56-30 years old who are at high risk for lung cancer because of a history of smoking.  A yearly low-dose CT scan of the lungs is recommended for people who:  Currently smoke.  Have quit within the past 15 years.  Have at least a 30-pack-year history of smoking. A pack year is smoking an average of one pack of cigarettes a day for 1 year.  Yearly screening should continue until it has been 15 years since you quit.  Yearly screening should stop if you develop a health problem that would prevent you from having lung cancer treatment.  Breast Cancer  Practice breast self-awareness. This means understanding how your breasts normally appear and feel.  It also means doing regular breast self-exams. Let your health care provider know about any changes, no matter how small.  If you are in your 20s or 30s, you should have a clinical breast exam (CBE) by a health care provider every 1-3 years as part of a regular health exam.  If you are 36 or older, have a CBE every year. Also consider having a breast X-ray (mammogram) every year.  If you have a family history of breast cancer, talk to your health care provider about genetic screening.  If you are at high risk for breast cancer, talk to your health care provider about having an MRI and a mammogram every year.  Breast cancer gene (BRCA) assessment is recommended for women who have family members with BRCA-related cancers. BRCA-related cancers include:  Breast.  Ovarian.  Tubal.  Peritoneal cancers.  Results of the assessment will determine the need for genetic counseling and BRCA1 and BRCA2 testing. Cervical Cancer Routine pelvic examinations to screen for cervical cancer are no longer recommended for nonpregnant women who are considered low risk for cancer of the pelvic organs (ovaries, uterus, and vagina) and who do not have symptoms. A  pelvic examination may be necessary if you have symptoms including those associated with pelvic infections. Ask your health care provider if a screening pelvic exam is right for you.   The Pap test is the screening test for cervical cancer for women who are considered at risk.  If you had a hysterectomy for a problem that was not cancer or a condition that could lead to cancer, then you no longer need Pap tests.  If you are older than 65 years, and you have had normal Pap tests for the past 10 years, you no longer need to have Pap tests.  If you have had past treatment for cervical cancer or a condition that could lead to cancer, you need Pap tests and screening for cancer for at least 20 years after your treatment.  If you no longer get a Pap test, assess your risk factors if they change (such as having a new sexual partner). This  can affect whether you should start being screened again.  Some women have medical problems that increase their chance of getting cervical cancer. If this is the case for you, your health care provider may recommend more frequent screening and Pap tests.  The human papillomavirus (HPV) test is another test that may be used for cervical cancer screening. The HPV test looks for the virus that can cause cell changes in the cervix. The cells collected during the Pap test can be tested for HPV.  The HPV test can be used to screen women 37 years of age and older. Getting tested for HPV can extend the interval between normal Pap tests from three to five years.  An HPV test also should be used to screen women of any age who have unclear Pap test results.  After 60 years of age, women should have HPV testing as often as Pap tests.  Colorectal Cancer  This type of cancer can be detected and often prevented.  Routine colorectal cancer screening usually begins at 60 years of age and continues through 60 years of age.  Your health care provider may recommend screening at an  earlier age if you have risk factors for colon cancer.  Your health care provider may also recommend using home test kits to check for hidden blood in the stool.  A small camera at the end of a tube can be used to examine your colon directly (sigmoidoscopy or colonoscopy). This is done to check for the earliest forms of colorectal cancer.  Routine screening usually begins at age 68.  Direct examination of the colon should be repeated every 5-10 years through 60 years of age. However, you may need to be screened more often if early forms of precancerous polyps or small growths are found. Skin Cancer  Check your skin from head to toe regularly.  Tell your health care provider about any new moles or changes in moles, especially if there is a change in a mole's shape or color.  Also tell your health care provider if you have a mole that is larger than the size of a pencil eraser.  Always use sunscreen. Apply sunscreen liberally and repeatedly throughout the day.  Protect yourself by wearing long sleeves, pants, a wide-brimmed hat, and sunglasses whenever you are outside. HEART DISEASE, DIABETES, AND HIGH BLOOD PRESSURE   Have your blood pressure checked at least every 1-2 years. High blood pressure causes heart disease and increases the risk of stroke.  If you are between 52 years and 70 years old, ask your health care provider if you should take aspirin to prevent strokes.  Have regular diabetes screenings. This involves taking a blood sample to check your fasting blood sugar level.  If you are at a normal weight and have a low risk for diabetes, have this test once every three years after 60 years of age.  If you are overweight and have a high risk for diabetes, consider being tested at a younger age or more often. PREVENTING INFECTION  Hepatitis B  If you have a higher risk for hepatitis B, you should be screened for this virus. You are considered at high risk for hepatitis B  if:  You were born in a country where hepatitis B is common. Ask your health care provider which countries are considered high risk.  Your parents were born in a high-risk country, and you have not been immunized against hepatitis B (hepatitis B vaccine).  You have HIV or AIDS.  You use needles to inject street drugs.  You live with someone who has hepatitis B.  You have had sex with someone who has hepatitis B.  You get hemodialysis treatment.  You take certain medicines for conditions, including cancer, organ transplantation, and autoimmune conditions. Hepatitis C  Blood testing is recommended for:  Everyone born from [birth year] through 1966.  Anyone with known risk factors for hepatitis C. Sexually transmitted infections (STIs)  You should be screened for sexually transmitted infections (STIs) including gonorrhea and chlamydia if:  You are sexually active and are younger than 60 years of age.  You are older than 60 years of age and your health care provider tells you that you are at risk for this type of infection.  Your sexual activity has changed since you were last screened and you are at an increased risk for chlamydia or gonorrhea. Ask your health care provider if you are at risk.  If you do not have HIV, but are at risk, it may be recommended that you take a prescription medicine daily to prevent HIV infection. This is called pre-exposure prophylaxis (PrEP). You are considered at risk if:  You are sexually active and do not regularly use condoms or know the HIV status of your partner(s).  You take drugs by injection.  You are sexually active with a partner who has HIV. Talk with your health care provider about whether you are at high risk of being infected with HIV. If you choose to begin PrEP, you should first be tested for HIV. You should then be tested every 3 months for as long as you are taking PrEP.  PREGNANCY   If you are premenopausal and you may become  pregnant, ask your health care provider about preconception counseling.  If you may become pregnant, take 400 to 800 micrograms (mcg) of folic acid every day.  If you want to prevent pregnancy, talk to your health care provider about birth control (contraception). OSTEOPOROSIS AND MENOPAUSE   Osteoporosis is a disease in which the bones lose minerals and strength with aging. This can result in serious bone fractures. Your risk for osteoporosis can be identified using a bone density scan.  If you are 99 years of age or older, or if you are at risk for osteoporosis and fractures, ask your health care provider if you should be screened.  Ask your health care provider whether you should take a calcium or vitamin D supplement to lower your risk for osteoporosis.  Menopause may have certain physical symptoms and risks.  Hormone replacement therapy may reduce some of these symptoms and risks. Talk to your health care provider about whether hormone replacement therapy is right for you.  HOME CARE INSTRUCTIONS   Schedule regular health, dental, and eye exams.  Stay current with your immunizations.   Do not use any tobacco products including cigarettes, chewing tobacco, or electronic cigarettes.  If you are pregnant, do not drink alcohol.  If you are breastfeeding, limit how much and how often you drink alcohol.  Limit alcohol intake to no more than 1 drink per day for nonpregnant women. One drink equals 12 ounces of beer, 5 ounces of wine, or 1 ounces of hard liquor.  Do not use street drugs.  Do not share needles.  Ask your health care provider for help if you need support or information about quitting drugs.  Tell your health care provider if you often feel depressed.  Tell your health care provider if you have  ever been abused or do not feel safe at home. Document Released: 05/18/2011 Document Revised: 03/19/2014 Document Reviewed: 10/04/2013 [facility] Patient Information 2015  [place], [place]. This information is not intended to replace advice given to you by your health care provider. Make sure you discuss any questions you have with your health care provider.

## 2015-04-05 NOTE — Progress Notes (Signed)
Subjective:    Patient ID: Lori Cooper, female    DOB: Mar 28, 1956, 59 y.o.   MRN: [id number]  Gastrophageal Reflux She complains of heartburn. She reports no belching, no coughing or no sore throat. This is a chronic problem. The current episode started more than 1 year ago. The problem occurs frequently. The problem has been waxing and waning. The heartburn wakes her from sleep. The heartburn does not limit her activity. The heartburn changes with position. The symptoms are aggravated by certain foods and lying down. Pertinent negatives include no muscle weakness. She has tried a PPI for the symptoms. The treatment provided moderate relief.  Hyperlipidemia This is a chronic problem. The current episode started more than 1 year ago. The problem is uncontrolled. Recent lipid tests were reviewed and are high. She has no history of diabetes or hypothyroidism. Factors aggravating her hyperlipidemia include fatty foods. Pertinent negatives include no leg pain, myalgias or shortness of breath. Current antihyperlipidemic treatment includes statins and diet change. The current treatment provides moderate improvement of lipids. Risk factors for coronary artery disease include dyslipidemia, post-menopausal, obesity and family history.  Anxiety Presents for follow-up visit. Onset was 1 to 6 months ago. The problem has been waxing and waning. Symptoms include depressed mood and nervous/anxious behavior. Patient reports no compulsions, excessive worry, insomnia, irritability, muscle tension, palpitations, panic or shortness of breath. Symptoms occur rarely.   Her past medical history is significant for anxiety/panic attacks and depression. Past treatments include SSRIs and benzodiazephines. The treatment provided significant relief. Compliance with prior treatments has been good.  Weight loss Pt currently taking Phentermine 37.5 mg daily. Pt states she is exercising daily and has lost about 30 lbs.     Review  of Systems  Constitutional: Negative.  Negative for irritability.  HENT: Negative.  Negative for sore throat.   Eyes: Negative.   Respiratory: Negative.  Negative for cough and shortness of breath.   Cardiovascular: Negative.  Negative for palpitations.  Gastrointestinal: Positive for heartburn.  Endocrine: Negative.   Genitourinary: Negative.   Musculoskeletal: Negative.  Negative for myalgias and muscle weakness.  Neurological: Negative.  Negative for headaches.  Hematological: Negative.   Psychiatric/Behavioral: The patient is nervous/anxious. The patient does not have insomnia.   All other systems reviewed and are negative.      Objective:   Physical Exam  Constitutional: She is oriented to person, place, and time. She appears well-developed and well-nourished. No distress.  HENT:  Head: Normocephalic and atraumatic.  Right Ear: External ear normal.  Left Ear: External ear normal.  Nose: Nose normal.  Mouth/Throat: Oropharynx is clear and moist.  Eyes: Pupils are equal, round, and reactive to light.  Neck: Normal range of motion. Neck supple. No thyromegaly present.  Cardiovascular: Normal rate, regular rhythm, normal heart sounds and intact distal pulses.   No murmur heard. Pulmonary/Chest: Effort normal and breath sounds normal. No respiratory distress. She has no wheezes.  Abdominal: Soft. Bowel sounds are normal. She exhibits no distension. There is no tenderness.  Musculoskeletal: Normal range of motion. She exhibits no edema or tenderness.  Neurological: She is alert and oriented to person, place, and time. She has normal reflexes. No cranial nerve deficit.  Skin: Skin is warm and dry.  Psychiatric: She has a normal mood and affect. Her behavior is normal. Judgment and thought content normal.  Vitals reviewed.     BP 113/77 mmHg  Pulse 65  Temp(Src) 98 F (36.7 C) (Oral)  Ht $R'5\' 5"'mo$  (  1.651 m)  Wt 209 lb 6.4 oz (94.983 kg)  BMI 34.85 kg/m2     Assessment &  Plan:  1. Gastroesophageal reflux disease with esophagitis - CMP14+EGFR  2. HLD (hyperlipidemia) - CMP14+EGFR - Lipid panel  3. Generalized anxiety disorder - CMP14+EGFR - FLUoxetine (PROZAC) 40 MG capsule; Take 2 capsules (80 mg total) by mouth daily.  Dispense: 180 capsule; Refill: 4  4. Vitamin D deficiency - CMP14+EGFR - Vit D  25 hydroxy (rtn osteoporosis monitoring)  5. Obesity - CMP14+EGFR  6. Depression - CMP14+EGFR - FLUoxetine (PROZAC) 40 MG capsule; Take 2 capsules (80 mg total) by mouth daily.  Dispense: 180 capsule; Refill: 4  7. Weight loss counseling, encounter for - CMP14+EGFR   Continue all meds Labs pending Health Maintenance reviewed Diet and exercise encouraged RTO 3 months  Evelina Dun, FNP

## 2015-04-06 LAB — LIPID PANEL
Chol/HDL Ratio: 3.2 ratio units (ref 0.0–4.4)
Cholesterol, Total: 188 mg/dL (ref 100–199)
HDL: 58 mg/dL (ref >39)
LDL Calculated: 113 mg/dL — ABNORMAL HIGH (ref 0–99)
Triglycerides: 87 mg/dL (ref 0–149)
VLDL Cholesterol Cal: 17 mg/dL (ref 5–40)

## 2015-04-06 LAB — CMP14+EGFR
ALT: 21 IU/L (ref 0–32)
AST: 16 IU/L (ref 0–40)
Albumin/Globulin Ratio: 1.9 (ref 1.1–2.5)
Albumin: 4.3 g/dL (ref 3.6–4.8)
Alkaline Phosphatase: 74 IU/L (ref 39–117)
BUN/Creatinine Ratio: 22 (ref 11–26)
BUN: 16 mg/dL (ref 8–27)
Bilirubin Total: 0.4 mg/dL (ref 0.0–1.2)
CO2: 24 mmol/L (ref 18–29)
Calcium: 9.3 mg/dL (ref 8.7–10.3)
Chloride: 101 mmol/L (ref 97–108)
Creatinine, Ser: 0.73 mg/dL (ref 0.57–1.00)
GFR calc Af Amer: 104 mL/min/{1.73_m2} (ref >59)
GFR calc non Af Amer: 90 mL/min/{1.73_m2} (ref >59)
Globulin, Total: 2.3 g/dL (ref 1.5–4.5)
Glucose: 109 mg/dL — ABNORMAL HIGH (ref 65–99)
Potassium: 4.7 mmol/L (ref 3.5–5.2)
Sodium: 141 mmol/L (ref 134–144)
Total Protein: 6.6 g/dL (ref 6.0–8.5)

## 2015-04-06 LAB — VITAMIN D 25 HYDROXY (VIT D DEFICIENCY, FRACTURES): Vit D, 25-Hydroxy: 42.8 ng/mL (ref 30.0–100.0)

## 2015-05-01 ENCOUNTER — Other Ambulatory Visit: Payer: Self-pay | Admitting: Family

## 2015-05-01 NOTE — Telephone Encounter (Signed)
Last seen 04/05/15 Alyse Low  If approved route to nurse to call into [facility]

## 2015-05-01 NOTE — Telephone Encounter (Signed)
rx called into pharmacy

## 2015-05-08 ENCOUNTER — Encounter: Payer: Self-pay | Admitting: Family

## 2015-05-08 ENCOUNTER — Ambulatory Visit (INDEPENDENT_AMBULATORY_CARE_PROVIDER_SITE_OTHER): Payer: 59 | Admitting: Family

## 2015-05-08 VITALS — BP 114/71 | HR 89 | Temp 98.3°F | Ht 65.0 in | Wt 207.2 lb

## 2015-05-08 DIAGNOSIS — J209 Acute bronchitis, unspecified: Secondary | ICD-10-CM | POA: Diagnosis not present

## 2015-05-08 MED ORDER — BENZONATATE 200 MG PO CAPS: 200.0000 mg | ORAL_CAPSULE | Freq: Three times a day (TID) | ORAL | Status: DC | PRN

## 2015-05-08 MED ORDER — HYDROCODONE-HOMATROPINE 5-1.5 MG/5ML PO SYRP: 5.0000 mL | ORAL_SOLUTION | Freq: Three times a day (TID) | ORAL | Status: DC | PRN

## 2015-05-08 MED ORDER — METHYLPREDNISOLONE 4 MG PO TBPK
ORAL_TABLET | ORAL | Status: DC
Start: 2015-05-08 — End: 2015-07-16

## 2015-05-08 MED ORDER — AZITHROMYCIN 250 MG PO TABS: ORAL_TABLET | ORAL | Status: DC

## 2015-05-08 NOTE — Progress Notes (Signed)
Subjective:    Patient ID: Lori Cooper, female    DOB: [birth year], 59 y.o.   MRN: [id number]  Cough This is a new problem. The current episode started in the past 7 days (Saturday). The problem has been gradually worsening. The problem occurs every few minutes. The cough is non-productive. Associated symptoms include ear pain, headaches, myalgias, nasal congestion, postnasal drip, a sore throat and wheezing. Pertinent negatives include no chills, ear congestion, fever, rhinorrhea or shortness of breath. The symptoms are aggravated by lying down. Treatments tried: Claritin. The treatment provided mild relief. There is no history of asthma or COPD.      Review of Systems  Constitutional: Negative.  Negative for fever and chills.  HENT: Positive for ear pain, postnasal drip and sore throat. Negative for rhinorrhea.   Eyes: Negative.   Respiratory: Positive for cough and wheezing. Negative for shortness of breath.   Cardiovascular: Negative.  Negative for palpitations.  Gastrointestinal: Negative.   Endocrine: Negative.   Genitourinary: Negative.   Musculoskeletal: Positive for myalgias.  Neurological: Positive for headaches.  Hematological: Negative.   Psychiatric/Behavioral: Negative.   All other systems reviewed and are negative.      Objective:   Physical Exam  Constitutional: She is oriented to person, place, and time. She appears well-developed and well-nourished. No distress.  HENT:  Head: Normocephalic and atraumatic.  Right Ear: External ear normal.  Mouth/Throat: Oropharynx is clear and moist.  Eyes: Pupils are equal, round, and reactive to light.  Neck: Normal range of motion. Neck supple. No thyromegaly present.  Cardiovascular: Normal rate, regular rhythm, normal heart sounds and intact distal pulses.   No murmur heard. Pulmonary/Chest: Effort normal and breath sounds normal. No respiratory distress. She has no wheezes.  Abdominal: Soft. Bowel sounds are normal. She  exhibits no distension. There is no tenderness.  Musculoskeletal: Normal range of motion. She exhibits no edema or tenderness.  Neurological: She is alert and oriented to person, place, and time. She has normal reflexes. No cranial nerve deficit.  Skin: Skin is warm and dry.  Psychiatric: She has a normal mood and affect. Her behavior is normal. Judgment and thought content normal.  Vitals reviewed.     BP 114/71 mmHg  Pulse 89  Temp(Src) 98.3 F (36.8 C) (Oral)  Ht 5\' 5"  (1.651 m)  Wt 207 lb 3.2 oz (93.985 kg)  BMI 34.48 kg/m2     Assessment & Plan:  1. Acute bronchitis, unspecified organism -- Take meds as prescribed - Use a cool mist humidifier  -Use saline nose sprays frequently -Saline irrigations of the nose can be very helpful if done frequently.  * 4X daily for 1 week*  * Use of a nettie pot can be helpful with this. Follow directions with this* -Force fluids -For any cough or congestion  Use plain Mucinex- regular strength or max strength is fine   * Children- consult with Pharmacist for dosing -For fever or aces or pains- take tylenol or ibuprofen appropriate for age and weight.  * for fevers greater than 101 orally you may alternate ibuprofen and tylenol every  3 hours. -Throat lozenges if help - methylPREDNISolone (MEDROL DOSEPAK) 4 MG TBPK tablet; Use as directed  Dispense: 21 tablet; Refill: 0 - azithromycin (ZITHROMAX) 250 MG tablet; Take 500 mg once, then 250 mg for four days  Dispense: 6 tablet; Refill: 0 - benzonatate (TESSALON) 200 MG capsule; Take 1 capsule (200 mg total) by mouth 3 (three) times daily as needed.  Dispense: 30 capsule; Refill: 1 - HYDROcodone-homatropine (HYCODAN) 5-1.5 MG/5ML syrup; Take 5 mLs by mouth every 8 (eight) hours as needed for cough.  Dispense: 120 mL; Refill: 0  Evelina Dun, FNP

## 2015-05-08 NOTE — Patient Instructions (Signed)
Acute Bronchitis Bronchitis is inflammation of the airways that extend from the windpipe into the lungs (bronchi). The inflammation often causes mucus to develop. This leads to a cough, which is the most common symptom of bronchitis.  In acute bronchitis, the condition usually develops suddenly and goes away over time, usually in a couple weeks. Smoking, allergies, and asthma can make bronchitis worse. Repeated episodes of bronchitis may cause further lung problems.  CAUSES Acute bronchitis is most often caused by the same virus that causes a cold. The virus can spread from person to person (contagious) through coughing, sneezing, and touching contaminated objects. SIGNS AND SYMPTOMS   Cough.   Fever.   Coughing up mucus.   Body aches.   Chest congestion.   Chills.   Shortness of breath.   Sore throat.  DIAGNOSIS  Acute bronchitis is usually diagnosed through a physical exam. Your health care provider will also ask you questions about your medical history. Tests, such as chest X-rays, are sometimes done to rule out other conditions.  TREATMENT  Acute bronchitis usually goes away in a couple weeks. Oftentimes, no medical treatment is necessary. Medicines are sometimes given for relief of fever or cough. Antibiotic medicines are usually not needed but may be prescribed in certain situations. In some cases, an inhaler may be recommended to help reduce shortness of breath and control the cough. A cool mist vaporizer may also be used to help thin bronchial secretions and make it easier to clear the chest.  HOME CARE INSTRUCTIONS  Get plenty of rest.   Drink enough fluids to keep your urine clear or pale yellow (unless you have a medical condition that requires fluid restriction). Increasing fluids may help thin your respiratory secretions (sputum) and reduce chest congestion, and it will prevent dehydration.   Take medicines only as directed by your health care provider.  If  you were prescribed an antibiotic medicine, finish it all even if you start to feel better.  Avoid smoking and secondhand smoke. Exposure to cigarette smoke or irritating chemicals will make bronchitis worse. If you are a smoker, consider using nicotine gum or skin patches to help control withdrawal symptoms. Quitting smoking will help your lungs heal faster.   Reduce the chances of another bout of acute bronchitis by washing your hands frequently, avoiding people with cold symptoms, and trying not to touch your hands to your mouth, nose, or eyes.   Keep all follow-up visits as directed by your health care provider.  SEEK MEDICAL CARE IF: Your symptoms do not improve after 1 week of treatment.  SEEK IMMEDIATE MEDICAL CARE IF:  You develop an increased fever or chills.   You have chest pain.   You have severe shortness of breath.  You have bloody sputum.   You develop dehydration.  You faint or repeatedly feel like you are going to pass out.  You develop repeated vomiting.  You develop a severe headache. MAKE SURE YOU:   Understand these instructions.  Will watch your condition.  Will get help right away if you are not doing well or get worse. Document Released: 12/10/2004 Document Revised: 03/19/2014 Document Reviewed: 04/25/2013 Exit[employer] Patient Information 2015 [employer]. This information is not intended to replace advice given to you by your health care provider. Make sure you discuss any questions you have with your health care provider.  - Take meds as prescribed - Use a cool mist humidifier  -Use saline nose sprays frequently -Saline irrigations of the nose can   be very helpful if done frequently.  * 4X daily for 1 week*  * Use of a nettie pot can be helpful with this. Follow directions with this* -Force fluids -For any cough or congestion  Use plain Mucinex- regular strength or max strength is fine   * Children- consult with Pharmacist for dosing -For  fever or aces or pains- take tylenol or ibuprofen appropriate for age and weight.  * for fevers greater than 101 orally you may alternate ibuprofen and tylenol every  3 hours. -Throat lozenges if help    Alanea Woolridge, FNP  

## 2015-06-03 ENCOUNTER — Encounter: Payer: Self-pay | Admitting: *Deleted

## 2015-06-26 ENCOUNTER — Other Ambulatory Visit: Payer: Self-pay | Admitting: Family

## 2015-06-26 NOTE — Telephone Encounter (Signed)
rx called to pharmacy 

## 2015-06-26 NOTE — Telephone Encounter (Signed)
Last filled 05/31/15, Last seen 04/05/15. Route to pool, nurse call in at [facility]

## 2015-07-15 ENCOUNTER — Ambulatory Visit: Payer: 59 | Admitting: Family

## 2015-07-16 ENCOUNTER — Ambulatory Visit (INDEPENDENT_AMBULATORY_CARE_PROVIDER_SITE_OTHER): Payer: PPO | Admitting: Family

## 2015-07-16 ENCOUNTER — Encounter: Payer: Self-pay | Admitting: Family

## 2015-07-16 VITALS — BP 109/71 | HR 67 | Temp 97.2°F | Ht 65.0 in | Wt 209.4 lb

## 2015-07-16 DIAGNOSIS — F411 Generalized anxiety disorder: Secondary | ICD-10-CM | POA: Diagnosis not present

## 2015-07-16 DIAGNOSIS — Z1159 Encounter for screening for other viral diseases: Secondary | ICD-10-CM | POA: Diagnosis not present

## 2015-07-16 DIAGNOSIS — M1990 Unspecified osteoarthritis, unspecified site: Secondary | ICD-10-CM | POA: Insufficient documentation

## 2015-07-16 DIAGNOSIS — E669 Obesity, unspecified: Secondary | ICD-10-CM

## 2015-07-16 DIAGNOSIS — E785 Hyperlipidemia, unspecified: Secondary | ICD-10-CM

## 2015-07-16 DIAGNOSIS — E559 Vitamin D deficiency, unspecified: Secondary | ICD-10-CM

## 2015-07-16 DIAGNOSIS — G2581 Restless legs syndrome: Secondary | ICD-10-CM

## 2015-07-16 DIAGNOSIS — K2100 Gastro-esophageal reflux disease with esophagitis, without bleeding: Secondary | ICD-10-CM

## 2015-07-16 DIAGNOSIS — Z23 Encounter for immunization: Secondary | ICD-10-CM

## 2015-07-16 DIAGNOSIS — F32A Depression, unspecified: Secondary | ICD-10-CM

## 2015-07-16 DIAGNOSIS — F329 Major depressive disorder, single episode, unspecified: Secondary | ICD-10-CM

## 2015-07-16 MED ORDER — PANTOPRAZOLE SODIUM 40 MG PO TBEC: 40.0000 mg | DELAYED_RELEASE_TABLET | Freq: Every day | ORAL | Status: DC

## 2015-07-16 NOTE — Patient Instructions (Signed)
Food Choices for Gastroesophageal Reflux Disease When you have gastroesophageal reflux disease (GERD), the foods you eat and your eating habits are very important. Choosing the right foods can help ease the discomfort of GERD. WHAT GENERAL GUIDELINES DO I NEED TO FOLLOW?  Choose fruits, vegetables, whole grains, low-fat dairy products, and low-fat meat, fish, and poultry.  Limit fats such as oils, salad dressings, butter, nuts, and avocado.  Keep a food diary to identify foods that cause symptoms.  Avoid foods that cause reflux. These may be different for different people.  Eat frequent small meals instead of three large meals each day.  Eat your meals slowly, in a relaxed setting.  Limit fried foods.  Cook foods using methods other than frying.  Avoid drinking alcohol.  Avoid drinking large amounts of liquids with your meals.  Avoid bending over or lying down until 2-3 hours after eating. WHAT FOODS ARE NOT RECOMMENDED? The following are some foods and drinks that may worsen your symptoms: Vegetables Tomatoes. Tomato juice. Tomato and spaghetti sauce. Chili peppers. Onion and garlic. Horseradish. Fruits Oranges, grapefruit, and lemon (fruit and juice). Meats High-fat meats, fish, and poultry. This includes hot dogs, ribs, ham, sausage, salami, and bacon. Dairy Whole milk and chocolate milk. Sour cream. Cream. Butter. Ice cream. Cream cheese.  Beverages Coffee and tea, with or without caffeine. Carbonated beverages or energy drinks. Condiments Hot sauce. Barbecue sauce.  Sweets/Desserts Chocolate and cocoa. Donuts. Peppermint and spearmint. Fats and Oils High-fat foods, including [place] fries and potato chips. Other Vinegar. Strong spices, such as black pepper, white pepper, red pepper, cayenne, curry powder, cloves, ginger, and chili powder. The items listed above may not be a complete list of foods and beverages to avoid. Contact your dietitian for more  information. Document Released: 11/02/2005 Document Revised: 11/07/2013 Document Reviewed: 09/06/2013 [facility] Patient Information 2015 [place], [place]. This information is not intended to replace advice given to you by your health care provider. Make sure you discuss any questions you have with your health care provider. Health Maintenance Adopting a healthy lifestyle and getting preventive care can go a long way to promote health and wellness. Talk with your health care provider about what schedule of regular examinations is right for you. This is a good chance for you to check in with your provider about disease prevention and staying healthy. In between checkups, there are plenty of things you can do on your own. Experts have done a lot of research about which lifestyle changes and preventive measures are most likely to keep you healthy. Ask your health care provider for more information. WEIGHT AND DIET  Eat a healthy diet  Be sure to include plenty of vegetables, fruits, low-fat dairy products, and lean protein.  Do not eat a lot of foods high in solid fats, added sugars, or salt.  Get regular exercise. This is one of the most important things you can do for your health.  Most adults should exercise for at least 150 minutes each week. The exercise should increase your heart rate and make you sweat (moderate-intensity exercise).  Most adults should also do strengthening exercises at least twice a week. This is in addition to the moderate-intensity exercise.  Maintain a healthy weight  Body mass index (BMI) is a measurement that can be used to identify possible weight problems. It estimates body fat based on height and weight. Your health care provider can help determine your BMI and help you achieve or maintain a healthy weight.  For  females 66 years of age and older:   A BMI below 18.5 is considered underweight.  A BMI of 18.5 to 24.9 is normal.  A BMI of 25 to 29.9 is considered  overweight.  A BMI of 30 and above is considered obese.  Watch levels of cholesterol and blood lipids  You should start having your blood tested for lipids and cholesterol at 60 years of age, then have this test every 5 years.  You may need to have your cholesterol levels checked more often if:  Your lipid or cholesterol levels are high.  You are older than 60 years of age.  You are at high risk for heart disease.  CANCER SCREENING   Lung Cancer  Lung cancer screening is recommended for adults 73-24 years old who are at high risk for lung cancer because of a history of smoking.  A yearly low-dose CT scan of the lungs is recommended for people who:  Currently smoke.  Have quit within the past 15 years.  Have at least a 30-pack-year history of smoking. A pack year is smoking an average of one pack of cigarettes a day for 1 year.  Yearly screening should continue until it has been 15 years since you quit.  Yearly screening should stop if you develop a health problem that would prevent you from having lung cancer treatment.  Breast Cancer  Practice breast self-awareness. This means understanding how your breasts normally appear and feel.  It also means doing regular breast self-exams. Let your health care provider know about any changes, no matter how small.  If you are in your 20s or 30s, you should have a clinical breast exam (CBE) by a health care provider every 1-3 years as part of a regular health exam.  If you are 9 or older, have a CBE every year. Also consider having a breast X-ray (mammogram) every year.  If you have a family history of breast cancer, talk to your health care provider about genetic screening.  If you are at high risk for breast cancer, talk to your health care provider about having an MRI and a mammogram every year.  Breast cancer gene (BRCA) assessment is recommended for women who have family members with BRCA-related cancers. BRCA-related  cancers include:  Breast.  Ovarian.  Tubal.  Peritoneal cancers.  Results of the assessment will determine the need for genetic counseling and BRCA1 and BRCA2 testing. Cervical Cancer Routine pelvic examinations to screen for cervical cancer are no longer recommended for nonpregnant women who are considered low risk for cancer of the pelvic organs (ovaries, uterus, and vagina) and who do not have symptoms. A pelvic examination may be necessary if you have symptoms including those associated with pelvic infections. Ask your health care provider if a screening pelvic exam is right for you.   The Pap test is the screening test for cervical cancer for women who are considered at risk.  If you had a hysterectomy for a problem that was not cancer or a condition that could lead to cancer, then you no longer need Pap tests.  If you are older than 65 years, and you have had normal Pap tests for the past 10 years, you no longer need to have Pap tests.  If you have had past treatment for cervical cancer or a condition that could lead to cancer, you need Pap tests and screening for cancer for at least 20 years after your treatment.  If you no longer get a  Pap test, assess your risk factors if they change (such as having a new sexual partner). This can affect whether you should start being screened again.  Some women have medical problems that increase their chance of getting cervical cancer. If this is the case for you, your health care provider may recommend more frequent screening and Pap tests.  The human papillomavirus (HPV) test is another test that may be used for cervical cancer screening. The HPV test looks for the virus that can cause cell changes in the cervix. The cells collected during the Pap test can be tested for HPV.  The HPV test can be used to screen women 50 years of age and older. Getting tested for HPV can extend the interval between normal Pap tests from three to five  years.  An HPV test also should be used to screen women of any age who have unclear Pap test results.  After 60 years of age, women should have HPV testing as often as Pap tests.  Colorectal Cancer  This type of cancer can be detected and often prevented.  Routine colorectal cancer screening usually begins at 60 years of age and continues through 60 years of age.  Your health care provider may recommend screening at an earlier age if you have risk factors for colon cancer.  Your health care provider may also recommend using home test kits to check for hidden blood in the stool.  A small camera at the end of a tube can be used to examine your colon directly (sigmoidoscopy or colonoscopy). This is done to check for the earliest forms of colorectal cancer.  Routine screening usually begins at age 75.  Direct examination of the colon should be repeated every 5-10 years through 60 years of age. However, you may need to be screened more often if early forms of precancerous polyps or small growths are found. Skin Cancer  Check your skin from head to toe regularly.  Tell your health care provider about any new moles or changes in moles, especially if there is a change in a mole's shape or color.  Also tell your health care provider if you have a mole that is larger than the size of a pencil eraser.  Always use sunscreen. Apply sunscreen liberally and repeatedly throughout the day.  Protect yourself by wearing long sleeves, pants, a wide-brimmed hat, and sunglasses whenever you are outside. HEART DISEASE, DIABETES, AND HIGH BLOOD PRESSURE   Have your blood pressure checked at least every 1-2 years. High blood pressure causes heart disease and increases the risk of stroke.  If you are between 31 years and 6 years old, ask your health care provider if you should take aspirin to prevent strokes.  Have regular diabetes screenings. This involves taking a blood sample to check your fasting  blood sugar level.  If you are at a normal weight and have a low risk for diabetes, have this test once every three years after 60 years of age.  If you are overweight and have a high risk for diabetes, consider being tested at a younger age or more often. PREVENTING INFECTION  Hepatitis B  If you have a higher risk for hepatitis B, you should be screened for this virus. You are considered at high risk for hepatitis B if:  You were born in a country where hepatitis B is common. Ask your health care provider which countries are considered high risk.  Your parents were born in a high-risk country, and  you have not been immunized against hepatitis B (hepatitis B vaccine).  You have HIV or AIDS.  You use needles to inject street drugs.  You live with someone who has hepatitis B.  You have had sex with someone who has hepatitis B.  You get hemodialysis treatment.  You take certain medicines for conditions, including cancer, organ transplantation, and autoimmune conditions. Hepatitis C  Blood testing is recommended for:  Everyone born from [birth year] through 1966.  Anyone with known risk factors for hepatitis C. Sexually transmitted infections (STIs)  You should be screened for sexually transmitted infections (STIs) including gonorrhea and chlamydia if:  You are sexually active and are younger than 60 years of age.  You are older than 60 years of age and your health care provider tells you that you are at risk for this type of infection.  Your sexual activity has changed since you were last screened and you are at an increased risk for chlamydia or gonorrhea. Ask your health care provider if you are at risk.  If you do not have HIV, but are at risk, it may be recommended that you take a prescription medicine daily to prevent HIV infection. This is called pre-exposure prophylaxis (PrEP). You are considered at risk if:  You are sexually active and do not regularly use condoms or know  the HIV status of your partner(s).  You take drugs by injection.  You are sexually active with a partner who has HIV. Talk with your health care provider about whether you are at high risk of being infected with HIV. If you choose to begin PrEP, you should first be tested for HIV. You should then be tested every 3 months for as long as you are taking PrEP.  PREGNANCY   If you are premenopausal and you may become pregnant, ask your health care provider about preconception counseling.  If you may become pregnant, take 400 to 800 micrograms (mcg) of folic acid every day.  If you want to prevent pregnancy, talk to your health care provider about birth control (contraception). OSTEOPOROSIS AND MENOPAUSE   Osteoporosis is a disease in which the bones lose minerals and strength with aging. This can result in serious bone fractures. Your risk for osteoporosis can be identified using a bone density scan.  If you are 8 years of age or older, or if you are at risk for osteoporosis and fractures, ask your health care provider if you should be screened.  Ask your health care provider whether you should take a calcium or vitamin D supplement to lower your risk for osteoporosis.  Menopause may have certain physical symptoms and risks.  Hormone replacement therapy may reduce some of these symptoms and risks. Talk to your health care provider about whether hormone replacement therapy is right for you.  HOME CARE INSTRUCTIONS   Schedule regular health, dental, and eye exams.  Stay current with your immunizations.   Do not use any tobacco products including cigarettes, chewing tobacco, or electronic cigarettes.  If you are pregnant, do not drink alcohol.  If you are breastfeeding, limit how much and how often you drink alcohol.  Limit alcohol intake to no more than 1 drink per day for nonpregnant women. One drink equals 12 ounces of beer, 5 ounces of wine, or 1 ounces of hard liquor.  Do not  use street drugs.  Do not share needles.  Ask your health care provider for help if you need support or information about quitting drugs.  Tell your health care provider if you often feel depressed.  Tell your health care provider if you have ever been abused or do not feel safe at home. Document Released: 05/18/2011 Document Revised: 03/19/2014 Document Reviewed: 10/04/2013 [employer] Patient Information 2015 [employer]. This information is not intended to replace advice given to you by your health care provider. Make sure you discuss any questions you have with your health care provider.  

## 2015-07-16 NOTE — Progress Notes (Signed)
Subjective:    Patient ID: Lori Cooper, female    DOB: [birth year], 59 y.o.   MRN: [id number]  Pt presents to the office today for chronic follow up. Pt states she is following up with Ortho for chronic back pain and right knee pain. Pt states she has arthritis and is planning on getting some "steriod shots".  Hyperlipidemia This is a chronic problem. The current episode started more than 1 year ago. The problem is uncontrolled. Recent lipid tests were reviewed and are high. She has no history of diabetes or hypothyroidism. Factors aggravating her hyperlipidemia include fatty foods. Pertinent negatives include no leg pain, myalgias or shortness of breath. Current antihyperlipidemic treatment includes statins and diet change. The current treatment provides moderate improvement of lipids. Risk factors for coronary artery disease include dyslipidemia, post-menopausal, obesity and family history.  Gastrophageal Reflux She complains of heartburn. She reports no belching, no coughing or no sore throat. This is a chronic problem. The current episode started more than 1 year ago. The problem occurs frequently. The problem has been waxing and waning. The heartburn wakes her from sleep. The heartburn does not limit her activity. The heartburn changes with position. The symptoms are aggravated by certain foods and lying down. Pertinent negatives include no muscle weakness. She has tried a PPI for the symptoms. The treatment provided moderate relief.  Anxiety Presents for follow-up visit. Onset was 1 to 6 months ago. The problem has been waxing and waning. Symptoms include depressed mood and nervous/anxious behavior. Patient reports no compulsions, excessive worry, insomnia, irritability, muscle tension, palpitations, panic or shortness of breath. Symptoms occur rarely.   Her past medical history is significant for anxiety/panic attacks and depression. Past treatments include SSRIs and benzodiazephines. The  treatment provided significant relief. Compliance with prior treatments has been good.  Arthritis Presents for follow-up visit. She complains of pain and stiffness. She reports no joint warmth. Affected locations include the right knee (back). Her pain is at a severity of 8/10. Pertinent negatives include no dry eyes. Her past medical history is significant for osteoarthritis. Past treatments include rest and corticosteroids. The treatment provided mild relief. Factors aggravating her arthritis include climbing stairs.      Review of Systems  Constitutional: Negative.  Negative for irritability.  HENT: Negative.  Negative for sore throat.   Eyes: Negative.   Respiratory: Negative.  Negative for cough and shortness of breath.   Cardiovascular: Negative.  Negative for palpitations.  Gastrointestinal: Positive for heartburn.  Endocrine: Negative.   Genitourinary: Negative.   Musculoskeletal: Positive for arthritis and stiffness. Negative for myalgias and muscle weakness.  Neurological: Negative.  Negative for headaches.  Hematological: Negative.   Psychiatric/Behavioral: The patient is nervous/anxious. The patient does not have insomnia.   All other systems reviewed and are negative.      Objective:   Physical Exam  Constitutional: She is oriented to person, place, and time. She appears well-developed and well-nourished. No distress.  HENT:  Head: Normocephalic and atraumatic.  Right Ear: External ear normal.  Left Ear: External ear normal.  Nose: Nose normal.  Mouth/Throat: Oropharynx is clear and moist.  Eyes: Pupils are equal, round, and reactive to light.  Neck: Normal range of motion. Neck supple. No thyromegaly present.  Cardiovascular: Normal rate, regular rhythm, normal heart sounds and intact distal pulses.   No murmur heard. Pulmonary/Chest: Effort normal and breath sounds normal. No respiratory distress. She has no wheezes.  Abdominal: Soft. Bowel sounds are normal. She  exhibits  no distension. There is no tenderness.  Musculoskeletal: Normal range of motion. She exhibits no edema or tenderness.  Neurological: She is alert and oriented to person, place, and time. She has normal reflexes. No cranial nerve deficit.  Skin: Skin is warm and dry.  Psychiatric: She has a normal mood and affect. Her behavior is normal. Judgment and thought content normal.  Vitals reviewed.    BP 109/71 mmHg  Pulse 67  Temp(Src) 97.2 F (36.2 C) (Oral)  Ht $R'5\' 5"'HK$  (1.651 m)  Wt 209 lb 6.4 oz (94.983 kg)  BMI 34.85 kg/m2      Assessment & Plan:  1. Gastroesophageal reflux disease with esophagitis -Pt started on protonix 40 mg today, Pt's omeprazole d/c - CMP14+EGFR - pantoprazole (PROTONIX) 40 MG tablet; Take 1 tablet (40 mg total) by mouth daily.  Dispense: 90 tablet; Refill: 3  2. HLD (hyperlipidemia) - CMP14+EGFR - Lipid panel  3. Generalized anxiety disorder - CMP14+EGFR  4. Restless legs - CMP14+EGFR  5. Vitamin D deficiency - CMP14+EGFR - Vit D  25 hydroxy (rtn osteoporosis monitoring)  6. Obesity - CMP14+EGFR  7. Depression - CMP14+EGFR  8. Arthritis - CMP14+EGFR  9. Need for hepatitis C screening test - CMP14+EGFR - Hepatitis C antibody   Continue all meds Labs pending Health Maintenance reviewed Diet and exercise encouraged RTO 6 months   Evelina Dun, FNP

## 2015-07-17 LAB — CMP14+EGFR
ALT: 23 IU/L (ref 0–32)
AST: 18 IU/L (ref 0–40)
Albumin/Globulin Ratio: 2 (ref 1.1–2.5)
Albumin: 4.1 g/dL (ref 3.6–4.8)
Alkaline Phosphatase: 68 IU/L (ref 39–117)
BUN/Creatinine Ratio: 18 (ref 11–26)
BUN: 13 mg/dL (ref 8–27)
Bilirubin Total: 0.3 mg/dL (ref 0.0–1.2)
CO2: 25 mmol/L (ref 18–29)
Calcium: 9.2 mg/dL (ref 8.7–10.3)
Chloride: 103 mmol/L (ref 97–108)
Creatinine, Ser: 0.74 mg/dL (ref 0.57–1.00)
GFR calc Af Amer: 102 mL/min/{1.73_m2} (ref >59)
GFR calc non Af Amer: 88 mL/min/{1.73_m2} (ref >59)
Globulin, Total: 2.1 g/dL (ref 1.5–4.5)
Glucose: 101 mg/dL — ABNORMAL HIGH (ref 65–99)
Potassium: 4.6 mmol/L (ref 3.5–5.2)
Sodium: 142 mmol/L (ref 134–144)
Total Protein: 6.2 g/dL (ref 6.0–8.5)

## 2015-07-17 LAB — HEPATITIS C ANTIBODY: Hep C Virus Ab: <0.1 s/co ratio (ref 0.0–0.9)

## 2015-07-17 LAB — LIPID PANEL
Chol/HDL Ratio: 2.8 ratio units (ref 0.0–4.4)
Cholesterol, Total: 170 mg/dL (ref 100–199)
HDL: 61 mg/dL (ref >39)
LDL Calculated: 93 mg/dL (ref 0–99)
Triglycerides: 79 mg/dL (ref 0–149)
VLDL Cholesterol Cal: 16 mg/dL (ref 5–40)

## 2015-07-17 LAB — VITAMIN D 25 HYDROXY (VIT D DEFICIENCY, FRACTURES): Vit D, 25-Hydroxy: 46.1 ng/mL (ref 30.0–100.0)

## 2015-08-14 ENCOUNTER — Other Ambulatory Visit: Payer: Self-pay | Admitting: Family

## 2015-08-14 NOTE — Telephone Encounter (Signed)
Last seen 06/19/15 Lori Cooper  If approved print

## 2015-08-19 ENCOUNTER — Other Ambulatory Visit: Payer: Self-pay | Admitting: Family

## 2015-08-19 MED ORDER — PHENTERMINE HCL 37.5 MG PO CAPS: 37.5000 mg | ORAL_CAPSULE | ORAL | Status: DC

## 2015-08-19 NOTE — Telephone Encounter (Signed)
Pt aware written Rx is at front desk ready for pickup and to make appt for first of January w/ Alyse Low

## 2015-08-19 NOTE — Telephone Encounter (Signed)
RX ready for pick up. Pt will need to follow up in 3 months

## 2015-08-22 ENCOUNTER — Ambulatory Visit (INDEPENDENT_AMBULATORY_CARE_PROVIDER_SITE_OTHER): Payer: PPO

## 2015-08-22 DIAGNOSIS — Z23 Encounter for immunization: Secondary | ICD-10-CM | POA: Diagnosis not present

## 2015-10-01 ENCOUNTER — Encounter: Payer: Self-pay | Admitting: Gastroenterology

## 2015-10-08 ENCOUNTER — Other Ambulatory Visit: Payer: Self-pay | Admitting: Family

## 2015-10-09 NOTE — Telephone Encounter (Signed)
Last seen 07/16/15 Lori Cooper  If approved route to nurse to call into [facility]

## 2015-11-15 ENCOUNTER — Ambulatory Visit (INDEPENDENT_AMBULATORY_CARE_PROVIDER_SITE_OTHER): Payer: PPO | Admitting: Family

## 2015-11-15 ENCOUNTER — Encounter: Payer: Self-pay | Admitting: Family

## 2015-11-15 VITALS — BP 117/65 | HR 74 | Temp 97.5°F | Ht 65.0 in | Wt 217.0 lb

## 2015-11-15 DIAGNOSIS — K2100 Gastro-esophageal reflux disease with esophagitis, without bleeding: Secondary | ICD-10-CM

## 2015-11-15 DIAGNOSIS — F411 Generalized anxiety disorder: Secondary | ICD-10-CM

## 2015-11-15 DIAGNOSIS — G2581 Restless legs syndrome: Secondary | ICD-10-CM | POA: Diagnosis not present

## 2015-11-15 DIAGNOSIS — F329 Major depressive disorder, single episode, unspecified: Secondary | ICD-10-CM | POA: Diagnosis not present

## 2015-11-15 DIAGNOSIS — E785 Hyperlipidemia, unspecified: Secondary | ICD-10-CM | POA: Diagnosis not present

## 2015-11-15 DIAGNOSIS — E669 Obesity, unspecified: Secondary | ICD-10-CM

## 2015-11-15 DIAGNOSIS — M1990 Unspecified osteoarthritis, unspecified site: Secondary | ICD-10-CM

## 2015-11-15 DIAGNOSIS — E559 Vitamin D deficiency, unspecified: Secondary | ICD-10-CM | POA: Diagnosis not present

## 2015-11-15 DIAGNOSIS — F32A Depression, unspecified: Secondary | ICD-10-CM

## 2015-11-15 MED ORDER — CYCLOBENZAPRINE HCL 10 MG PO TABS: 10.0000 mg | ORAL_TABLET | Freq: Three times a day (TID) | ORAL | Status: DC | PRN

## 2015-11-15 MED ORDER — LORAZEPAM 0.5 MG PO TABS: 0.5000 mg | ORAL_TABLET | Freq: Three times a day (TID) | ORAL | Status: DC | PRN

## 2015-11-15 MED ORDER — ALBUTEROL SULFATE HFA 108 (90 BASE) MCG/ACT IN AERS: 2.0000 | INHALATION_SPRAY | Freq: Four times a day (QID) | RESPIRATORY_TRACT | Status: DC | PRN

## 2015-11-15 MED ORDER — PHENTERMINE HCL 37.5 MG PO CAPS: 37.5000 mg | ORAL_CAPSULE | ORAL | Status: DC

## 2015-11-15 NOTE — Progress Notes (Signed)
 Subjective:    Patient ID: Lori Cooper, female    DOB: 02/15/1956, 59 y.o.   MRN: [id number]  Pt presents to the office today for chronic follow up.  Hyperlipidemia This is a chronic problem. The current episode started more than 1 year ago. The problem is controlled. Recent lipid tests were reviewed and are normal. She has no history of diabetes or hypothyroidism. Factors aggravating her hyperlipidemia include fatty foods. Pertinent negatives include no leg pain, myalgias or shortness of breath. Current antihyperlipidemic treatment includes statins and diet change. The current treatment provides moderate improvement of lipids. Risk factors for coronary artery disease include dyslipidemia, post-menopausal, obesity and family history.  Gastroesophageal Reflux She complains of heartburn. She reports no belching, no coughing or no sore throat. This is a chronic problem. The current episode started more than 1 year ago. The problem occurs frequently. The problem has been waxing and waning. The heartburn does not wake her from sleep. The heartburn does not limit her activity. The heartburn doesn't change with position. The symptoms are aggravated by certain foods and lying down. Pertinent negatives include no muscle weakness. She has tried a PPI for the symptoms. The treatment provided significant relief.  Anxiety Presents for follow-up visit. Onset was 1 to 6 months ago. The problem has been waxing and waning. Symptoms include depressed mood and nervous/anxious behavior. Patient reports no compulsions, excessive worry, insomnia, irritability, muscle tension, palpitations, panic or shortness of breath. Symptoms occur rarely.   Her past medical history is significant for anxiety/panic attacks and depression. Past treatments include SSRIs and benzodiazephines. The treatment provided significant relief. Compliance with prior treatments has been good.  Arthritis Presents for follow-up visit. She complains of  pain and stiffness. She reports no joint warmth. Affected locations include the right knee (back). Her pain is at a severity of 8/10. Pertinent negatives include no dry eyes. Her past medical history is significant for osteoarthritis. Past treatments include rest and corticosteroids. The treatment provided mild relief. Factors aggravating her arthritis include climbing stairs.      Review of Systems  Constitutional: Negative.  Negative for irritability.  HENT: Negative.  Negative for sore throat.   Eyes: Negative.   Respiratory: Negative.  Negative for cough and shortness of breath.   Cardiovascular: Negative.  Negative for palpitations.  Gastrointestinal: Positive for heartburn.  Endocrine: Negative.   Genitourinary: Negative.   Musculoskeletal: Positive for arthritis and stiffness. Negative for myalgias and muscle weakness.  Neurological: Negative.  Negative for headaches.  Hematological: Negative.   Psychiatric/Behavioral: The patient is nervous/anxious. The patient does not have insomnia.   All other systems reviewed and are negative.      Objective:   Physical Exam  Constitutional: She is oriented to person, place, and time. She appears well-developed and well-nourished. No distress.  HENT:  Head: Normocephalic and atraumatic.  Right Ear: External ear normal.  Left Ear: External ear normal.  Nose: Nose normal.  Mouth/Throat: Oropharynx is clear and moist.  Eyes: Pupils are equal, round, and reactive to light.  Neck: Normal range of motion. Neck supple. No thyromegaly present.  Cardiovascular: Normal rate, regular rhythm, normal heart sounds and intact distal pulses.   No murmur heard. Pulmonary/Chest: Effort normal and breath sounds normal. No respiratory distress. She has no wheezes.  Abdominal: Soft. Bowel sounds are normal. She exhibits no distension. There is no tenderness.  Musculoskeletal: Normal range of motion. She exhibits no edema or tenderness.  Neurological: She  is alert and oriented to   Subjective:    Patient ID: Lori Cooper, female    DOB: 08/30/1956, 59 y.o.   MRN: [id number]  Pt presents to the office today for chronic follow up.  Hyperlipidemia This is a chronic problem. The current episode started more than 1 year ago. The problem is controlled. Recent lipid tests were reviewed and are normal. She has no history of diabetes or hypothyroidism. Factors aggravating her hyperlipidemia include fatty foods. Pertinent negatives include no leg pain, myalgias or shortness of breath. Current antihyperlipidemic treatment includes statins and diet change. The current treatment provides moderate improvement of lipids. Risk factors for coronary artery disease include dyslipidemia, post-menopausal, obesity and family history.  Gastroesophageal Reflux She complains of heartburn. She reports no belching, no coughing or no sore throat. This is a chronic problem. The current episode started more than 1 year ago. The problem occurs frequently. The problem has been waxing and waning. The heartburn does not wake her from sleep. The heartburn does not limit her activity. The heartburn doesn't change with position. The symptoms are aggravated by certain foods and lying down. Pertinent negatives include no muscle weakness. She has tried a PPI for the symptoms. The treatment provided significant relief.  Anxiety Presents for follow-up visit. Onset was 1 to 6 months ago. The problem has been waxing and waning. Symptoms include depressed mood and nervous/anxious behavior. Patient reports no compulsions, excessive worry, insomnia, irritability, muscle tension, palpitations, panic or shortness of breath. Symptoms occur rarely.   Her past medical history is significant for anxiety/panic attacks and depression. Past treatments include SSRIs and benzodiazephines. The treatment provided significant relief. Compliance with prior treatments has been good.  Arthritis Presents for follow-up visit. She complains of  pain and stiffness. She reports no joint warmth. Affected locations include the right knee (back). Her pain is at a severity of 8/10. Pertinent negatives include no dry eyes. Her past medical history is significant for osteoarthritis. Past treatments include rest and corticosteroids. The treatment provided mild relief. Factors aggravating her arthritis include climbing stairs.      Review of Systems  Constitutional: Negative.  Negative for irritability.  HENT: Negative.  Negative for sore throat.   Eyes: Negative.   Respiratory: Negative.  Negative for cough and shortness of breath.   Cardiovascular: Negative.  Negative for palpitations.  Gastrointestinal: Positive for heartburn.  Endocrine: Negative.   Genitourinary: Negative.   Musculoskeletal: Positive for arthritis and stiffness. Negative for myalgias and muscle weakness.  Neurological: Negative.  Negative for headaches.  Hematological: Negative.   Psychiatric/Behavioral: The patient is nervous/anxious. The patient does not have insomnia.   All other systems reviewed and are negative.      Objective:   Physical Exam  Constitutional: She is oriented to person, place, and time. She appears well-developed and well-nourished. No distress.  HENT:  Head: Normocephalic and atraumatic.  Right Ear: External ear normal.  Left Ear: External ear normal.  Nose: Nose normal.  Mouth/Throat: Oropharynx is clear and moist.  Eyes: Pupils are equal, round, and reactive to light.  Neck: Normal range of motion. Neck supple. No thyromegaly present.  Cardiovascular: Normal rate, regular rhythm, normal heart sounds and intact distal pulses.   No murmur heard. Pulmonary/Chest: Effort normal and breath sounds normal. No respiratory distress. She has no wheezes.  Abdominal: Soft. Bowel sounds are normal. She exhibits no distension. There is no tenderness.  Musculoskeletal: Normal range of motion. She exhibits no edema or tenderness.  Neurological: She  is alert and oriented to

## 2015-11-15 NOTE — Patient Instructions (Signed)
Health Maintenance, Female Adopting a healthy lifestyle and getting preventive care can go a long way to promote health and wellness. Talk with your health care provider about what schedule of regular examinations is right for you. This is a good chance for you to check in with your provider about disease prevention and staying healthy. In between checkups, there are plenty of things you can do on your own. Experts have done a lot of research about which lifestyle changes and preventive measures are most likely to keep you healthy. Ask your health care provider for more information. WEIGHT AND DIET  Eat a healthy diet  Be sure to include plenty of vegetables, fruits, low-fat dairy products, and lean protein.  Do not eat a lot of foods high in solid fats, added sugars, or salt.  Get regular exercise. This is one of the most important things you can do for your health.  Most adults should exercise for at least 150 minutes each week. The exercise should increase your heart rate and make you sweat (moderate-intensity exercise).  Most adults should also do strengthening exercises at least twice a week. This is in addition to the moderate-intensity exercise.  Maintain a healthy weight  Body mass index (BMI) is a measurement that can be used to identify possible weight problems. It estimates body fat based on height and weight. Your health care provider can help determine your BMI and help you achieve or maintain a healthy weight.  For females 20 years of age and older:   A BMI below 18.5 is considered underweight.  A BMI of 18.5 to 24.9 is normal.  A BMI of 25 to 29.9 is considered overweight.  A BMI of 30 and above is considered obese.  Watch levels of cholesterol and blood lipids  You should start having your blood tested for lipids and cholesterol at 60 years of age, then have this test every 5 years.  You may need to have your cholesterol levels checked more often if:  Your lipid  or cholesterol levels are high.  You are older than 60 years of age.  You are at high risk for heart disease.  CANCER SCREENING   Lung Cancer  Lung cancer screening is recommended for adults 55-80 years old who are at high risk for lung cancer because of a history of smoking.  A yearly low-dose CT scan of the lungs is recommended for people who:  Currently smoke.  Have quit within the past 15 years.  Have at least a 30-pack-year history of smoking. A pack year is smoking an average of one pack of cigarettes a day for 1 year.  Yearly screening should continue until it has been 15 years since you quit.  Yearly screening should stop if you develop a health problem that would prevent you from having lung cancer treatment.  Breast Cancer  Practice breast self-awareness. This means understanding how your breasts normally appear and feel.  It also means doing regular breast self-exams. Let your health care provider know about any changes, no matter how small.  If you are in your 20s or 30s, you should have a clinical breast exam (CBE) by a health care provider every 1-3 years as part of a regular health exam.  If you are 40 or older, have a CBE every year. Also consider having a breast X-ray (mammogram) every year.  If you have a family history of breast cancer, talk to your health care provider about genetic screening.  If you   are at high risk for breast cancer, talk to your health care provider about having an MRI and a mammogram every year.  Breast cancer gene (BRCA) assessment is recommended for women who have family members with BRCA-related cancers. BRCA-related cancers include:  Breast.  Ovarian.  Tubal.  Peritoneal cancers.  Results of the assessment will determine the need for genetic counseling and BRCA1 and BRCA2 testing. Cervical Cancer Your health care provider may recommend that you be screened regularly for cancer of the pelvic organs (ovaries, uterus, and  vagina). This screening involves a pelvic examination, including checking for microscopic changes to the surface of your cervix (Pap test). You may be encouraged to have this screening done every 3 years, beginning at age 21.  For women ages 30-65, health care providers may recommend pelvic exams and Pap testing every 3 years, or they may recommend the Pap and pelvic exam, combined with testing for human papilloma virus (HPV), every 5 years. Some types of HPV increase your risk of cervical cancer. Testing for HPV may also be done on women of any age with unclear Pap test results.  Other health care providers may not recommend any screening for nonpregnant women who are considered low risk for pelvic cancer and who do not have symptoms. Ask your health care provider if a screening pelvic exam is right for you.  If you have had past treatment for cervical cancer or a condition that could lead to cancer, you need Pap tests and screening for cancer for at least 20 years after your treatment. If Pap tests have been discontinued, your risk factors (such as having a new sexual partner) need to be reassessed to determine if screening should resume. Some women have medical problems that increase the chance of getting cervical cancer. In these cases, your health care provider may recommend more frequent screening and Pap tests. Colorectal Cancer  This type of cancer can be detected and often prevented.  Routine colorectal cancer screening usually begins at 60 years of age and continues through 60 years of age.  Your health care provider may recommend screening at an earlier age if you have risk factors for colon cancer.  Your health care provider may also recommend using home test kits to check for hidden blood in the stool.  A small camera at the end of a tube can be used to examine your colon directly (sigmoidoscopy or colonoscopy). This is done to check for the earliest forms of colorectal  cancer.  Routine screening usually begins at age 50.  Direct examination of the colon should be repeated every 5-10 years through 60 years of age. However, you may need to be screened more often if early forms of precancerous polyps or small growths are found. Skin Cancer  Check your skin from head to toe regularly.  Tell your health care provider about any new moles or changes in moles, especially if there is a change in a mole's shape or color.  Also tell your health care provider if you have a mole that is larger than the size of a pencil eraser.  Always use sunscreen. Apply sunscreen liberally and repeatedly throughout the day.  Protect yourself by wearing long sleeves, pants, a wide-brimmed hat, and sunglasses whenever you are outside. HEART DISEASE, DIABETES, AND HIGH BLOOD PRESSURE   High blood pressure causes heart disease and increases the risk of stroke. High blood pressure is more likely to develop in:  People who have blood pressure in the high end   of the normal range (130-139/85-89 mm Hg).  People who are overweight or obese.  People who are African American.  If you are 38-23 years of age, have your blood pressure checked every 3-5 years. If you are 61 years of age or older, have your blood pressure checked every year. You should have your blood pressure measured twice--once when you are at a hospital or clinic, and once when you are not at a hospital or clinic. Record the average of the two measurements. To check your blood pressure when you are not at a hospital or clinic, you can use:  An automated blood pressure machine at a pharmacy.  A home blood pressure monitor.  If you are between 45 years and 39 years old, ask your health care provider if you should take aspirin to prevent strokes.  Have regular diabetes screenings. This involves taking a blood sample to check your fasting blood sugar level.  If you are at a normal weight and have a low risk for diabetes,  have this test once every three years after 60 years of age.  If you are overweight and have a high risk for diabetes, consider being tested at a younger age or more often. PREVENTING INFECTION  Hepatitis B  If you have a higher risk for hepatitis B, you should be screened for this virus. You are considered at high risk for hepatitis B if:  You were born in a country where hepatitis B is common. Ask your health care provider which countries are considered high risk.  Your parents were born in a high-risk country, and you have not been immunized against hepatitis B (hepatitis B vaccine).  You have HIV or AIDS.  You use needles to inject street drugs.  You live with someone who has hepatitis B.  You have had sex with someone who has hepatitis B.  You get hemodialysis treatment.  You take certain medicines for conditions, including cancer, organ transplantation, and autoimmune conditions. Hepatitis C  Blood testing is recommended for:  Everyone born from [birth year] through 1966.  Anyone with known risk factors for hepatitis C. Sexually transmitted infections (STIs)  You should be screened for sexually transmitted infections (STIs) including gonorrhea and chlamydia if:  You are sexually active and are younger than 60 years of age.  You are older than 60 years of age and your health care provider tells you that you are at risk for this type of infection.  Your sexual activity has changed since you were last screened and you are at an increased risk for chlamydia or gonorrhea. Ask your health care provider if you are at risk.  If you do not have HIV, but are at risk, it may be recommended that you take a prescription medicine daily to prevent HIV infection. This is called pre-exposure prophylaxis (PrEP). You are considered at risk if:  You are sexually active and do not regularly use condoms or know the HIV status of your partner(s).  You take drugs by injection.  You are sexually  active with a partner who has HIV. Talk with your health care provider about whether you are at high risk of being infected with HIV. If you choose to begin PrEP, you should first be tested for HIV. You should then be tested every 3 months for as long as you are taking PrEP.  PREGNANCY   If you are premenopausal and you may become pregnant, ask your health care provider about preconception counseling.  If you may  become pregnant, take 400 to 800 micrograms (mcg) of folic acid every day.  If you want to prevent pregnancy, talk to your health care provider about birth control (contraception). OSTEOPOROSIS AND MENOPAUSE   Osteoporosis is a disease in which the bones lose minerals and strength with aging. This can result in serious bone fractures. Your risk for osteoporosis can be identified using a bone density scan.  If you are 61 years of age or older, or if you are at risk for osteoporosis and fractures, ask your health care provider if you should be screened.  Ask your health care provider whether you should take a calcium or vitamin D supplement to lower your risk for osteoporosis.  Menopause may have certain physical symptoms and risks.  Hormone replacement therapy may reduce some of these symptoms and risks. Talk to your health care provider about whether hormone replacement therapy is right for you.  HOME CARE INSTRUCTIONS   Schedule regular health, dental, and eye exams.  Stay current with your immunizations.   Do not use any tobacco products including cigarettes, chewing tobacco, or electronic cigarettes.  If you are pregnant, do not drink alcohol.  If you are breastfeeding, limit how much and how often you drink alcohol.  Limit alcohol intake to no more than 1 drink per day for nonpregnant women. One drink equals 12 ounces of beer, 5 ounces of wine, or 1 ounces of hard liquor.  Do not use street drugs.  Do not share needles.  Ask your health care provider for help if  you need support or information about quitting drugs.  Tell your health care provider if you often feel depressed.  Tell your health care provider if you have ever been abused or do not feel safe at home.   This information is not intended to replace advice given to you by your health care provider. Make sure you discuss any questions you have with your health care provider.   Document Released: 05/18/2011 Document Revised: 11/23/2014 Document Reviewed: 10/04/2013 Elsevier Interactive Patient Education [employer].

## 2015-11-16 LAB — CMP14+EGFR
ALT: 31 IU/L (ref 0–32)
AST: 31 IU/L (ref 0–40)
Albumin/Globulin Ratio: 2 (ref 1.1–2.5)
Albumin: 4.1 g/dL (ref 3.6–4.8)
Alkaline Phosphatase: 80 IU/L (ref 39–117)
BUN/Creatinine Ratio: 13 (ref 11–26)
BUN: 9 mg/dL (ref 8–27)
Bilirubin Total: 0.3 mg/dL (ref 0.0–1.2)
CO2: 23 mmol/L (ref 18–29)
Calcium: 9.2 mg/dL (ref 8.7–10.3)
Chloride: 102 mmol/L (ref 96–106)
Creatinine, Ser: 0.68 mg/dL (ref 0.57–1.00)
GFR calc Af Amer: 110 mL/min/{1.73_m2} (ref >59)
GFR calc non Af Amer: 95 mL/min/{1.73_m2} (ref >59)
Globulin, Total: 2.1 g/dL (ref 1.5–4.5)
Glucose: 107 mg/dL — ABNORMAL HIGH (ref 65–99)
Potassium: 4.6 mmol/L (ref 3.5–5.2)
Sodium: 141 mmol/L (ref 134–144)
Total Protein: 6.2 g/dL (ref 6.0–8.5)

## 2015-11-16 LAB — VITAMIN D 25 HYDROXY (VIT D DEFICIENCY, FRACTURES): Vit D, 25-Hydroxy: 37.1 ng/mL (ref 30.0–100.0)

## 2015-11-16 LAB — LIPID PANEL
Chol/HDL Ratio: 3.2 ratio units (ref 0.0–4.4)
Cholesterol, Total: 191 mg/dL (ref 100–199)
HDL: 59 mg/dL (ref >39)
LDL Calculated: 95 mg/dL (ref 0–99)
Triglycerides: 184 mg/dL — ABNORMAL HIGH (ref 0–149)
VLDL Cholesterol Cal: 37 mg/dL (ref 5–40)

## 2016-01-12 ENCOUNTER — Other Ambulatory Visit: Payer: Self-pay | Admitting: Family

## 2016-01-14 ENCOUNTER — Ambulatory Visit: Payer: PPO | Admitting: Family

## 2016-01-14 ENCOUNTER — Encounter: Payer: PPO | Admitting: *Deleted

## 2016-01-14 DIAGNOSIS — Z1231 Encounter for screening mammogram for malignant neoplasm of breast: Secondary | ICD-10-CM | POA: Diagnosis not present

## 2016-01-14 LAB — HM MAMMOGRAPHY

## 2016-01-17 ENCOUNTER — Encounter: Payer: Self-pay | Admitting: *Deleted

## 2016-02-20 ENCOUNTER — Encounter: Payer: Self-pay | Admitting: Family

## 2016-02-20 ENCOUNTER — Ambulatory Visit (INDEPENDENT_AMBULATORY_CARE_PROVIDER_SITE_OTHER): Payer: PPO | Admitting: Family

## 2016-02-20 VITALS — BP 122/68 | HR 70 | Temp 97.7°F | Ht 65.0 in | Wt 217.0 lb

## 2016-02-20 DIAGNOSIS — K2100 Gastro-esophageal reflux disease with esophagitis, without bleeding: Secondary | ICD-10-CM

## 2016-02-20 DIAGNOSIS — Z114 Encounter for screening for human immunodeficiency virus [HIV]: Secondary | ICD-10-CM | POA: Diagnosis not present

## 2016-02-20 DIAGNOSIS — F329 Major depressive disorder, single episode, unspecified: Secondary | ICD-10-CM | POA: Diagnosis not present

## 2016-02-20 DIAGNOSIS — M1990 Unspecified osteoarthritis, unspecified site: Secondary | ICD-10-CM | POA: Diagnosis not present

## 2016-02-20 DIAGNOSIS — E785 Hyperlipidemia, unspecified: Secondary | ICD-10-CM | POA: Diagnosis not present

## 2016-02-20 DIAGNOSIS — E559 Vitamin D deficiency, unspecified: Secondary | ICD-10-CM

## 2016-02-20 DIAGNOSIS — F411 Generalized anxiety disorder: Secondary | ICD-10-CM | POA: Diagnosis not present

## 2016-02-20 DIAGNOSIS — E669 Obesity, unspecified: Secondary | ICD-10-CM

## 2016-02-20 DIAGNOSIS — R739 Hyperglycemia, unspecified: Secondary | ICD-10-CM | POA: Diagnosis not present

## 2016-02-20 DIAGNOSIS — F32A Depression, unspecified: Secondary | ICD-10-CM

## 2016-02-20 DIAGNOSIS — G2581 Restless legs syndrome: Secondary | ICD-10-CM | POA: Diagnosis not present

## 2016-02-20 NOTE — Patient Instructions (Signed)
Health Maintenance, Female Adopting a healthy lifestyle and getting preventive care can go a long way to promote health and wellness. Talk with your health care provider about what schedule of regular examinations is right for you. This is a good chance for you to check in with your provider about disease prevention and staying healthy. In between checkups, there are plenty of things you can do on your own. Experts have done a lot of research about which lifestyle changes and preventive measures are most likely to keep you healthy. Ask your health care provider for more information. WEIGHT AND DIET  Eat a healthy diet  Be sure to include plenty of vegetables, fruits, low-fat dairy products, and lean protein.  Do not eat a lot of foods high in solid fats, added sugars, or salt.  Get regular exercise. This is one of the most important things you can do for your health.  Most adults should exercise for at least 150 minutes each week. The exercise should increase your heart rate and make you sweat (moderate-intensity exercise).  Most adults should also do strengthening exercises at least twice a week. This is in addition to the moderate-intensity exercise.  Maintain a healthy weight  Body mass index (BMI) is a measurement that can be used to identify possible weight problems. It estimates body fat based on height and weight. Your health care provider can help determine your BMI and help you achieve or maintain a healthy weight.  For females 20 years of age and older:   A BMI below 18.5 is considered underweight.  A BMI of 18.5 to 24.9 is normal.  A BMI of 25 to 29.9 is considered overweight.  A BMI of 30 and above is considered obese.  Watch levels of cholesterol and blood lipids  You should start having your blood tested for lipids and cholesterol at 61 years of age, then have this test every 5 years.  You may need to have your cholesterol levels checked more often if:  Your lipid  or cholesterol levels are high.  You are older than 61 years of age.  You are at high risk for heart disease.  CANCER SCREENING   Lung Cancer  Lung cancer screening is recommended for adults 55-80 years old who are at high risk for lung cancer because of a history of smoking.  A yearly low-dose CT scan of the lungs is recommended for people who:  Currently smoke.  Have quit within the past 15 years.  Have at least a 30-pack-year history of smoking. A pack year is smoking an average of one pack of cigarettes a day for 1 year.  Yearly screening should continue until it has been 15 years since you quit.  Yearly screening should stop if you develop a health problem that would prevent you from having lung cancer treatment.  Breast Cancer  Practice breast self-awareness. This means understanding how your breasts normally appear and feel.  It also means doing regular breast self-exams. Let your health care provider know about any changes, no matter how small.  If you are in your 20s or 30s, you should have a clinical breast exam (CBE) by a health care provider every 1-3 years as part of a regular health exam.  If you are 40 or older, have a CBE every year. Also consider having a breast X-ray (mammogram) every year.  If you have a family history of breast cancer, talk to your health care provider about genetic screening.  If you   are at high risk for breast cancer, talk to your health care provider about having an MRI and a mammogram every year.  Breast cancer gene (BRCA) assessment is recommended for women who have family members with BRCA-related cancers. BRCA-related cancers include:  Breast.  Ovarian.  Tubal.  Peritoneal cancers.  Results of the assessment will determine the need for genetic counseling and BRCA1 and BRCA2 testing. Cervical Cancer Your health care provider may recommend that you be screened regularly for cancer of the pelvic organs (ovaries, uterus, and  vagina). This screening involves a pelvic examination, including checking for microscopic changes to the surface of your cervix (Pap test). You may be encouraged to have this screening done every 3 years, beginning at age 21.  For women ages 30-65, health care providers may recommend pelvic exams and Pap testing every 3 years, or they may recommend the Pap and pelvic exam, combined with testing for human papilloma virus (HPV), every 5 years. Some types of HPV increase your risk of cervical cancer. Testing for HPV may also be done on women of any age with unclear Pap test results.  Other health care providers may not recommend any screening for nonpregnant women who are considered low risk for pelvic cancer and who do not have symptoms. Ask your health care provider if a screening pelvic exam is right for you.  If you have had past treatment for cervical cancer or a condition that could lead to cancer, you need Pap tests and screening for cancer for at least 20 years after your treatment. If Pap tests have been discontinued, your risk factors (such as having a new sexual partner) need to be reassessed to determine if screening should resume. Some women have medical problems that increase the chance of getting cervical cancer. In these cases, your health care provider may recommend more frequent screening and Pap tests. Colorectal Cancer  This type of cancer can be detected and often prevented.  Routine colorectal cancer screening usually begins at 61 years of age and continues through 61 years of age.  Your health care provider may recommend screening at an earlier age if you have risk factors for colon cancer.  Your health care provider may also recommend using home test kits to check for hidden blood in the stool.  A small camera at the end of a tube can be used to examine your colon directly (sigmoidoscopy or colonoscopy). This is done to check for the earliest forms of colorectal  cancer.  Routine screening usually begins at age 50.  Direct examination of the colon should be repeated every 5-10 years through 61 years of age. However, you may need to be screened more often if early forms of precancerous polyps or small growths are found. Skin Cancer  Check your skin from head to toe regularly.  Tell your health care provider about any new moles or changes in moles, especially if there is a change in a mole's shape or color.  Also tell your health care provider if you have a mole that is larger than the size of a pencil eraser.  Always use sunscreen. Apply sunscreen liberally and repeatedly throughout the day.  Protect yourself by wearing long sleeves, pants, a wide-brimmed hat, and sunglasses whenever you are outside. HEART DISEASE, DIABETES, AND HIGH BLOOD PRESSURE   High blood pressure causes heart disease and increases the risk of stroke. High blood pressure is more likely to develop in:  People who have blood pressure in the high end   of the normal range (130-139/85-89 mm Hg).  People who are overweight or obese.  People who are African American.  If you are 38-23 years of age, have your blood pressure checked every 3-5 years. If you are 61 years of age or older, have your blood pressure checked every year. You should have your blood pressure measured twice--once when you are at a hospital or clinic, and once when you are not at a hospital or clinic. Record the average of the two measurements. To check your blood pressure when you are not at a hospital or clinic, you can use:  An automated blood pressure machine at a pharmacy.  A home blood pressure monitor.  If you are between 45 years and 39 years old, ask your health care provider if you should take aspirin to prevent strokes.  Have regular diabetes screenings. This involves taking a blood sample to check your fasting blood sugar level.  If you are at a normal weight and have a low risk for diabetes,  have this test once every three years after 61 years of age.  If you are overweight and have a high risk for diabetes, consider being tested at a younger age or more often. PREVENTING INFECTION  Hepatitis B  If you have a higher risk for hepatitis B, you should be screened for this virus. You are considered at high risk for hepatitis B if:  You were born in a country where hepatitis B is common. Ask your health care provider which countries are considered high risk.  Your parents were born in a high-risk country, and you have not been immunized against hepatitis B (hepatitis B vaccine).  You have HIV or AIDS.  You use needles to inject street drugs.  You live with someone who has hepatitis B.  You have had sex with someone who has hepatitis B.  You get hemodialysis treatment.  You take certain medicines for conditions, including cancer, organ transplantation, and autoimmune conditions. Hepatitis C  Blood testing is recommended for:  Everyone born from [birth year] through 1966.  Anyone with known risk factors for hepatitis C. Sexually transmitted infections (STIs)  You should be screened for sexually transmitted infections (STIs) including gonorrhea and chlamydia if:  You are sexually active and are younger than 61 years of age.  You are older than 61 years of age and your health care provider tells you that you are at risk for this type of infection.  Your sexual activity has changed since you were last screened and you are at an increased risk for chlamydia or gonorrhea. Ask your health care provider if you are at risk.  If you do not have HIV, but are at risk, it may be recommended that you take a prescription medicine daily to prevent HIV infection. This is called pre-exposure prophylaxis (PrEP). You are considered at risk if:  You are sexually active and do not regularly use condoms or know the HIV status of your partner(s).  You take drugs by injection.  You are sexually  active with a partner who has HIV. Talk with your health care provider about whether you are at high risk of being infected with HIV. If you choose to begin PrEP, you should first be tested for HIV. You should then be tested every 3 months for as long as you are taking PrEP.  PREGNANCY   If you are premenopausal and you may become pregnant, ask your health care provider about preconception counseling.  If you may  become pregnant, take 400 to 800 micrograms (mcg) of folic acid every day.  If you want to prevent pregnancy, talk to your health care provider about birth control (contraception). OSTEOPOROSIS AND MENOPAUSE   Osteoporosis is a disease in which the bones lose minerals and strength with aging. This can result in serious bone fractures. Your risk for osteoporosis can be identified using a bone density scan.  If you are 61 years of age or older, or if you are at risk for osteoporosis and fractures, ask your health care provider if you should be screened.  Ask your health care provider whether you should take a calcium or vitamin D supplement to lower your risk for osteoporosis.  Menopause may have certain physical symptoms and risks.  Hormone replacement therapy may reduce some of these symptoms and risks. Talk to your health care provider about whether hormone replacement therapy is right for you.  HOME CARE INSTRUCTIONS   Schedule regular health, dental, and eye exams.  Stay current with your immunizations.   Do not use any tobacco products including cigarettes, chewing tobacco, or electronic cigarettes.  If you are pregnant, do not drink alcohol.  If you are breastfeeding, limit how much and how often you drink alcohol.  Limit alcohol intake to no more than 1 drink per day for nonpregnant women. One drink equals 12 ounces of beer, 5 ounces of wine, or 1 ounces of hard liquor.  Do not use street drugs.  Do not share needles.  Ask your health care provider for help if  you need support or information about quitting drugs.  Tell your health care provider if you often feel depressed.  Tell your health care provider if you have ever been abused or do not feel safe at home.   This information is not intended to replace advice given to you by your health care provider. Make sure you discuss any questions you have with your health care provider.   Document Released: 05/18/2011 Document Revised: 11/23/2014 Document Reviewed: 10/04/2013 Elsevier Interactive Patient Education [employer].

## 2016-02-20 NOTE — Progress Notes (Signed)
Subjective:    Patient ID: Lori Cooper, female    DOB: November 07, 1956, 60 y.o.   MRN: [id number]  Pt presents to the office today for chronic follow up.  Hyperlipidemia This is a chronic problem. The current episode started more than 1 year ago. The problem is controlled. Recent lipid tests were reviewed and are normal. She has no history of diabetes or hypothyroidism. Factors aggravating her hyperlipidemia include fatty foods. Pertinent negatives include no leg pain, myalgias or shortness of breath. Current antihyperlipidemic treatment includes statins and diet change. The current treatment provides moderate improvement of lipids. Risk factors for coronary artery disease include dyslipidemia, post-menopausal, obesity and family history.  Gastroesophageal Reflux She complains of heartburn. She reports no belching, no coughing or no sore throat. This is a chronic problem. The current episode started more than 1 year ago. The problem occurs frequently. The problem has been waxing and waning. The heartburn does not wake her from sleep. The heartburn does not limit her activity. The heartburn doesn't change with position. The symptoms are aggravated by certain foods and lying down. Pertinent negatives include no muscle weakness. She has tried a PPI for the symptoms. The treatment provided significant relief.  Anxiety Presents for follow-up visit. Onset was 1 to 6 months ago. The problem has been waxing and waning. Symptoms include depressed mood and nervous/anxious behavior. Patient reports no compulsions, excessive worry, insomnia, irritability, muscle tension, palpitations, panic or shortness of breath. Symptoms occur rarely.   Her past medical history is significant for anxiety/panic attacks and depression. Past treatments include SSRIs and benzodiazephines. The treatment provided significant relief. Compliance with prior treatments has been good.  Arthritis Presents for follow-up visit. She complains of  pain and stiffness. She reports no joint warmth. Affected locations include the right knee (back). Her pain is at a severity of 8/10. Pertinent negatives include no dry eyes. Her past medical history is significant for osteoarthritis. Past treatments include rest and corticosteroids. The treatment provided mild relief. Factors aggravating her arthritis include climbing stairs.  RLS Pt states this seems to being doing better. PT states she takes "iron, magnesium, and potassium" and that seem to be helping. Pt states she also take either ativan or flexeril at night that seems to help.     Review of Systems  Constitutional: Negative.  Negative for irritability.  HENT: Negative.  Negative for sore throat.   Eyes: Negative.   Respiratory: Negative.  Negative for cough and shortness of breath.   Cardiovascular: Negative.  Negative for palpitations.  Gastrointestinal: Positive for heartburn.  Endocrine: Negative.   Genitourinary: Negative.   Musculoskeletal: Positive for arthritis and stiffness. Negative for myalgias and muscle weakness.  Neurological: Negative.  Negative for headaches.  Hematological: Negative.   Psychiatric/Behavioral: The patient is nervous/anxious. The patient does not have insomnia.   All other systems reviewed and are negative.      Objective:   Physical Exam  Constitutional: She is oriented to person, place, and time. She appears well-developed and well-nourished. No distress.  HENT:  Head: Normocephalic and atraumatic.  Right Ear: External ear normal.  Left Ear: External ear normal.  Nose: Nose normal.  Mouth/Throat: Oropharynx is clear and moist.  Eyes: Pupils are equal, round, and reactive to light.  Neck: Normal range of motion. Neck supple. No thyromegaly present.  Cardiovascular: Normal rate, regular rhythm, normal heart sounds and intact distal pulses.   No murmur heard. Pulmonary/Chest: Effort normal and breath sounds normal. No respiratory distress. She  has no wheezes.  Abdominal: Soft. Bowel sounds are normal. She exhibits no distension. There is no tenderness.  Musculoskeletal: Normal range of motion. She exhibits no edema or tenderness.  Neurological: She is alert and oriented to person, place, and time. No cranial nerve deficit.  Skin: Skin is warm and dry.  Psychiatric: She has a normal mood and affect. Her behavior is normal. Judgment and thought content normal.  Vitals reviewed.     BP 122/68 mmHg  Pulse 70  Temp(Src) 97.7 F (36.5 C) (Oral)  Ht _0  (1.651 m)  Wt 217 lb (98.431 kg)  BMI 36.11 kg/m2     Assessment & Plan:  1. Gastroesophageal reflux disease with esophagitis - CMP14+EGFR  2. Arthritis - CMP14+EGFR  3. HLD (hyperlipidemia) - CMP14+EGFR - Lipid panel  4. Generalized anxiety disorder - CMP14+EGFR  5. Restless legs - CMP14+EGFR  6. Vitamin D deficiency - CMP14+EGFR  7. Hyperglycemia - CMP14+EGFR  8. Obesity - CMP14+EGFR  9. Depression - CMP14+EGFR  10. Screening for HIV (human immunodeficiency virus) - CMP14+EGFR - HIV antibody   Continue all meds Labs pending Health Maintenance reviewed Diet and exercise encouraged RTO 6 months  Evelina Dun, FNP

## 2016-02-21 LAB — LIPID PANEL
Chol/HDL Ratio: 3 ratio units (ref 0.0–4.4)
Cholesterol, Total: 186 mg/dL (ref 100–199)
HDL: 61 mg/dL (ref >39)
LDL Calculated: 105 mg/dL — ABNORMAL HIGH (ref 0–99)
Triglycerides: 98 mg/dL (ref 0–149)
VLDL Cholesterol Cal: 20 mg/dL (ref 5–40)

## 2016-02-21 LAB — CMP14+EGFR
ALT: 19 IU/L (ref 0–32)
AST: 20 IU/L (ref 0–40)
Albumin/Globulin Ratio: 1.9 (ref 1.2–2.2)
Albumin: 4 g/dL (ref 3.6–4.8)
Alkaline Phosphatase: 111 IU/L (ref 39–117)
BUN/Creatinine Ratio: 15 (ref 12–28)
BUN: 11 mg/dL (ref 8–27)
Bilirubin Total: 0.4 mg/dL (ref 0.0–1.2)
CO2: 26 mmol/L (ref 18–29)
Calcium: 9.1 mg/dL (ref 8.7–10.3)
Chloride: 104 mmol/L (ref 96–106)
Creatinine, Ser: 0.71 mg/dL (ref 0.57–1.00)
GFR calc Af Amer: 106 mL/min/{1.73_m2} (ref >59)
GFR calc non Af Amer: 92 mL/min/{1.73_m2} (ref >59)
Globulin, Total: 2.1 g/dL (ref 1.5–4.5)
Glucose: 104 mg/dL — ABNORMAL HIGH (ref 65–99)
Potassium: 4.6 mmol/L (ref 3.5–5.2)
Sodium: 142 mmol/L (ref 134–144)
Total Protein: 6.1 g/dL (ref 6.0–8.5)

## 2016-02-21 LAB — HIV ANTIBODY (ROUTINE TESTING W REFLEX): HIV Screen 4th Generation wRfx: NONREACTIVE

## 2016-03-04 ENCOUNTER — Encounter: Payer: Self-pay | Admitting: Pharmacist

## 2016-03-04 ENCOUNTER — Ambulatory Visit (INDEPENDENT_AMBULATORY_CARE_PROVIDER_SITE_OTHER): Payer: PPO | Admitting: Pharmacist

## 2016-03-04 VITALS — BP 136/70 | HR 78 | Ht 65.0 in | Wt 215.0 lb

## 2016-03-04 DIAGNOSIS — E669 Obesity, unspecified: Secondary | ICD-10-CM

## 2016-03-04 DIAGNOSIS — R7303 Prediabetes: Secondary | ICD-10-CM | POA: Diagnosis not present

## 2016-03-04 NOTE — Patient Instructions (Signed)
Continue to exercise daily - try to do intervals with stationary bike.   Try to journal your meals and exercise
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
## 2016-03-26 ENCOUNTER — Other Ambulatory Visit: Payer: Self-pay | Admitting: Family

## 2016-04-12 ENCOUNTER — Other Ambulatory Visit: Payer: Self-pay | Admitting: Family

## 2016-04-28 ENCOUNTER — Other Ambulatory Visit: Payer: Self-pay | Admitting: Family

## 2016-06-01 ENCOUNTER — Other Ambulatory Visit: Payer: Self-pay | Admitting: Family

## 2016-06-02 NOTE — Telephone Encounter (Signed)
Last seen 4/17

## 2016-06-11 DIAGNOSIS — H40053 Ocular hypertension, bilateral: Secondary | ICD-10-CM | POA: Diagnosis not present

## 2016-06-24 DIAGNOSIS — H2512 Age-related nuclear cataract, left eye: Secondary | ICD-10-CM | POA: Diagnosis not present

## 2016-06-24 DIAGNOSIS — H25011 Cortical age-related cataract, right eye: Secondary | ICD-10-CM | POA: Diagnosis not present

## 2016-06-24 DIAGNOSIS — H53021 Refractive amblyopia, right eye: Secondary | ICD-10-CM | POA: Diagnosis not present

## 2016-06-24 DIAGNOSIS — H40013 Open angle with borderline findings, low risk, bilateral: Secondary | ICD-10-CM | POA: Diagnosis not present

## 2016-06-24 DIAGNOSIS — H2511 Age-related nuclear cataract, right eye: Secondary | ICD-10-CM | POA: Diagnosis not present

## 2016-06-24 DIAGNOSIS — H25012 Cortical age-related cataract, left eye: Secondary | ICD-10-CM | POA: Diagnosis not present

## 2016-06-24 DIAGNOSIS — H119 Unspecified disorder of conjunctiva: Secondary | ICD-10-CM | POA: Diagnosis not present

## 2016-07-07 ENCOUNTER — Other Ambulatory Visit: Payer: Self-pay | Admitting: Family

## 2016-07-07 DIAGNOSIS — K2100 Gastro-esophageal reflux disease with esophagitis, without bleeding: Secondary | ICD-10-CM

## 2016-07-21 ENCOUNTER — Other Ambulatory Visit: Payer: Self-pay | Admitting: Family

## 2016-07-28 DIAGNOSIS — H25812 Combined forms of age-related cataract, left eye: Secondary | ICD-10-CM | POA: Diagnosis not present

## 2016-07-28 DIAGNOSIS — H2512 Age-related nuclear cataract, left eye: Secondary | ICD-10-CM | POA: Diagnosis not present

## 2016-07-28 DIAGNOSIS — H25012 Cortical age-related cataract, left eye: Secondary | ICD-10-CM | POA: Diagnosis not present

## 2016-08-21 ENCOUNTER — Encounter: Payer: Self-pay | Admitting: Family

## 2016-08-21 ENCOUNTER — Ambulatory Visit (INDEPENDENT_AMBULATORY_CARE_PROVIDER_SITE_OTHER): Payer: PPO | Admitting: Family

## 2016-08-21 VITALS — BP 107/60 | HR 66 | Temp 97.8°F | Ht 65.0 in | Wt 208.0 lb

## 2016-08-21 DIAGNOSIS — Z23 Encounter for immunization: Secondary | ICD-10-CM | POA: Diagnosis not present

## 2016-08-21 DIAGNOSIS — M1990 Unspecified osteoarthritis, unspecified site: Secondary | ICD-10-CM

## 2016-08-21 DIAGNOSIS — F411 Generalized anxiety disorder: Secondary | ICD-10-CM

## 2016-08-21 DIAGNOSIS — E782 Mixed hyperlipidemia: Secondary | ICD-10-CM

## 2016-08-21 DIAGNOSIS — E559 Vitamin D deficiency, unspecified: Secondary | ICD-10-CM

## 2016-08-21 DIAGNOSIS — Z713 Dietary counseling and surveillance: Secondary | ICD-10-CM

## 2016-08-21 DIAGNOSIS — F329 Major depressive disorder, single episode, unspecified: Secondary | ICD-10-CM

## 2016-08-21 DIAGNOSIS — K2100 Gastro-esophageal reflux disease with esophagitis, without bleeding: Secondary | ICD-10-CM

## 2016-08-21 DIAGNOSIS — F32A Depression, unspecified: Secondary | ICD-10-CM

## 2016-08-21 MED ORDER — BUPROPION HCL ER (XL) 150 MG PO TB24: 150.0000 mg | ORAL_TABLET | Freq: Every day | ORAL | 1 refills | Status: DC

## 2016-08-21 MED ORDER — PHENTERMINE HCL 37.5 MG PO CAPS: 37.5000 mg | ORAL_CAPSULE | ORAL | 2 refills | Status: DC

## 2016-08-21 NOTE — Patient Instructions (Signed)
Stress and Stress Management Stress is a normal reaction to life events. It is what you feel when life demands more than you are used to or more than you can handle. Some stress can be useful. For example, the stress reaction can help you catch the last bus of the day, study for a test, or meet a deadline at work. But stress that occurs too often or for too long can cause problems. It can affect your emotional health and interfere with relationships and normal daily activities. Too much stress can weaken your immune system and increase your risk for physical illness. If you already have a medical problem, stress can make it worse. CAUSES  All sorts of life events may cause stress. An event that causes stress for one person may not be stressful for another person. Major life events commonly cause stress. These may be positive or negative. Examples include losing your job, moving into a new home, getting married, having a baby, or losing a loved one. Less obvious life events may also cause stress, especially if they occur day after day or in combination. Examples include working long hours, driving in traffic, caring for children, being in debt, or being in a difficult relationship. SIGNS AND SYMPTOMS Stress may cause emotional symptoms including, the following:  Anxiety. This is feeling worried, afraid, on edge, overwhelmed, or out of control.  Anger. This is feeling irritated or impatient.  Depression. This is feeling sad, down, helpless, or guilty.  Difficulty focusing, remembering, or making decisions. Stress may cause physical symptoms, including the following:   Aches and pains. These may affect your head, neck, back, stomach, or other areas of your body.  Tight muscles or clenched jaw.  Low energy or trouble sleeping. Stress may cause unhealthy behaviors, including the following:   Eating to feel better (overeating) or skipping meals.  Sleeping too little, too much, or both.  Working  too much or putting off tasks (procrastination).  Smoking, drinking alcohol, or using drugs to feel better. DIAGNOSIS  Stress is diagnosed through an assessment by your health care provider. Your health care provider will ask questions about your symptoms and any stressful life events.Your health care provider will also ask about your medical history and may order blood tests or other tests. Certain medical conditions and medicine can cause physical symptoms similar to stress. Mental illness can cause emotional symptoms and unhealthy behaviors similar to stress. Your health care provider may refer you to a mental health professional for further evaluation.  TREATMENT  Stress management is the recommended treatment for stress.The goals of stress management are reducing stressful life events and coping with stress in healthy ways.  Techniques for reducing stressful life events include the following:  Stress identification. Self-monitor for stress and identify what causes stress for you. These skills may help you to avoid some stressful events.  Time management. Set your priorities, keep a calendar of events, and learn to say "no." These tools can help you avoid making too many commitments. Techniques for coping with stress include the following:  Rethinking the problem. Try to think realistically about stressful events rather than ignoring them or overreacting. Try to find the positives in a stressful situation rather than focusing on the negatives.  Exercise. Physical exercise can release both physical and emotional tension. The key is to find a form of exercise you enjoy and do it regularly.  Relaxation techniques. These relax the body and mind. Examples include yoga, meditation, tai chi, biofeedback, deep  breathing, progressive muscle relaxation, listening to music, being out in nature, journaling, and other hobbies. Again, the key is to find one or more that you enjoy and can do  regularly.  Healthy lifestyle. Eat a balanced diet, get plenty of sleep, and do not smoke. Avoid using alcohol or drugs to relax.  Strong support network. Spend time with family, friends, or other people you enjoy being around.Express your feelings and talk things over with someone you trust. Counseling or talktherapy with a mental health professional may be helpful if you are having difficulty managing stress on your own. Medicine is typically not recommended for the treatment of stress.Talk to your health care provider if you think you need medicine for symptoms of stress. HOME CARE INSTRUCTIONS  Keep all follow-up visits as directed by your health care provider.  Take all medicines as directed by your health care provider. SEEK MEDICAL CARE IF:  Your symptoms get worse or you start having new symptoms.  You feel overwhelmed by your problems and can no longer manage them on your own. SEEK IMMEDIATE MEDICAL CARE IF:  You feel like hurting yourself or someone else.   This information is not intended to replace advice given to you by your health care provider. Make sure you discuss any questions you have with your health care provider.   Document Released: 04/28/2001 Document Revised: 11/23/2014 Document Reviewed: 06/27/2013 Elsevier Interactive Patient Education 2016 Elsevier Inc.  

## 2016-08-21 NOTE — Progress Notes (Signed)
Subjective:    Patient ID: Lori Cooper, female    DOB: Sep 29, 1956, 60 y.o.   MRN: [id number]  Pt presents to the office today for chronic follow up. Pt is requesting a rx of phentermine for weight loss. Pt states she is using her stationary bike for exercise daily. PT states she is having a great deal more of stress now. States she has learned that her husband was unfaithful 25 years ago and has been having to deal with that stress.  Hyperlipidemia  This is a chronic problem. The current episode started more than 1 year ago. The problem is uncontrolled. Recent lipid tests were reviewed and are high. Exacerbating diseases include obesity. She has no history of diabetes or hypothyroidism. Factors aggravating her hyperlipidemia include fatty foods. Pertinent negatives include no leg pain, myalgias or shortness of breath. Current antihyperlipidemic treatment includes statins and diet change. The current treatment provides moderate improvement of lipids. Risk factors for coronary artery disease include dyslipidemia, post-menopausal, obesity and family history.  Gastroesophageal Reflux  She complains of heartburn. She reports no belching, no coughing or no sore throat. This is a chronic problem. The current episode started more than 1 year ago. The problem occurs frequently. The problem has been waxing and waning. The heartburn does not wake her from sleep. The heartburn does not limit her activity. The heartburn doesn't change with position. The symptoms are aggravated by certain foods and lying down. Pertinent negatives include no muscle weakness. She has tried a PPI for the symptoms. The treatment provided significant relief.  Anxiety  Presents for follow-up visit. Onset was 1 to 6 months ago. The problem has been waxing and waning. Symptoms include depressed mood, excessive worry, irritability, nervous/anxious behavior and palpitations. Patient reports no compulsions, insomnia, muscle tension, panic or  shortness of breath. Symptoms occur rarely. The quality of sleep is good.   Her past medical history is significant for anxiety/panic attacks and depression. Past treatments include SSRIs and benzodiazephines. The treatment provided significant relief. Compliance with prior treatments has been good.  Arthritis  Presents for follow-up visit. She complains of pain and stiffness. She reports no joint warmth. Affected locations include the right knee (back). Her pain is at a severity of 8/10. Pertinent negatives include no dry eyes. Her past medical history is significant for osteoarthritis. Past treatments include rest and corticosteroids. The treatment provided mild relief. Factors aggravating her arthritis include climbing stairs.  RLS Pt states this seems to being doing better. PT states she takes "iron, magnesium, and potassium" and that seem to be helping. Pt states she also take either ativan or flexeril at night that seems to help.     Review of Systems  Constitutional: Positive for irritability.  HENT: Negative.  Negative for sore throat.   Eyes: Negative.   Respiratory: Negative.  Negative for cough and shortness of breath.   Cardiovascular: Positive for palpitations.  Gastrointestinal: Positive for heartburn.  Endocrine: Negative.   Genitourinary: Negative.   Musculoskeletal: Positive for arthritis and stiffness. Negative for myalgias and muscle weakness.  Neurological: Negative.  Negative for headaches.  Hematological: Negative.   Psychiatric/Behavioral: The patient is nervous/anxious. The patient does not have insomnia.   All other systems reviewed and are negative.      Objective:   Physical Exam  Constitutional: She is oriented to person, place, and time. She appears well-developed and well-nourished. No distress.  HENT:  Head: Normocephalic and atraumatic.  Right Ear: External ear normal.  Left Ear: External  ear normal.  Nose: Nose normal.  Mouth/Throat: Oropharynx is  clear and moist.  Eyes: Pupils are equal, round, and reactive to light.  Neck: Normal range of motion. Neck supple. No thyromegaly present.  Cardiovascular: Normal rate, regular rhythm, normal heart sounds and intact distal pulses.   No murmur heard. Pulmonary/Chest: Effort normal and breath sounds normal. No respiratory distress. She has no wheezes.  Abdominal: Soft. Bowel sounds are normal. She exhibits no distension. There is no tenderness.  Musculoskeletal: Normal range of motion. She exhibits no edema or tenderness.  Neurological: She is alert and oriented to person, place, and time. No cranial nerve deficit.  Skin: Skin is warm and dry.  Psychiatric: She has a normal mood and affect. Her behavior is normal. Judgment and thought content normal.  Vitals reviewed.     BP 107/60   Pulse 66   Temp 97.8 F (36.6 C) (Oral)   Ht '5\' 5"'$  (1.651 m)   Wt 208 lb (94.3 kg)   BMI 34.61 kg/m      Assessment & Plan:  1. Gastroesophageal reflux disease with esophagitis - CMP14+EGFR  2. Arthritis - CMP14+EGFR  3. Depression, unspecified depression type -Wellbutrin added today -Continue Prozac  -Stress management discussed - buPROPion (WELLBUTRIN XL) 150 MG 24 hr tablet; Take 1 tablet (150 mg total) by mouth daily.  Dispense: 90 tablet; Refill: 1 - CMP14+EGFR  4. Generalized anxiety disorder - buPROPion (WELLBUTRIN XL) 150 MG 24 hr tablet; Take 1 tablet (150 mg total) by mouth daily.  Dispense: 90 tablet; Refill: 1 - CMP14+EGFR  5. Mixed hyperlipidemia - CMP14+EGFR - Lipid panel  6. Vitamin D deficiency - CMP14+EGFR  7. Weight loss counseling, encounter for - phentermine 37.5 MG capsule; Take 1 capsule (37.5 mg total) by mouth every morning.  Dispense: 30 capsule; Refill: 2   Continue all meds Labs pending Health Maintenance reviewed Diet and exercise encouraged RTO 6 months  Evelina Dun, FNP

## 2016-08-22 LAB — LIPID PANEL
Chol/HDL Ratio: 3.5 ratio units (ref 0.0–4.4)
Cholesterol, Total: 207 mg/dL — ABNORMAL HIGH (ref 100–199)
HDL: 59 mg/dL (ref >39)
LDL Calculated: 118 mg/dL — ABNORMAL HIGH (ref 0–99)
Triglycerides: 151 mg/dL — ABNORMAL HIGH (ref 0–149)
VLDL Cholesterol Cal: 30 mg/dL (ref 5–40)

## 2016-08-22 LAB — CMP14+EGFR
ALT: 27 IU/L (ref 0–32)
AST: 22 IU/L (ref 0–40)
Albumin/Globulin Ratio: 2 (ref 1.2–2.2)
Albumin: 4.2 g/dL (ref 3.6–4.8)
Alkaline Phosphatase: 89 IU/L (ref 39–117)
BUN/Creatinine Ratio: 15 (ref 12–28)
BUN: 11 mg/dL (ref 8–27)
Bilirubin Total: 0.5 mg/dL (ref 0.0–1.2)
CO2: 27 mmol/L (ref 18–29)
Calcium: 9.2 mg/dL (ref 8.7–10.3)
Chloride: 101 mmol/L (ref 96–106)
Creatinine, Ser: 0.71 mg/dL (ref 0.57–1.00)
GFR calc Af Amer: 106 mL/min/{1.73_m2} (ref >59)
GFR calc non Af Amer: 92 mL/min/{1.73_m2} (ref >59)
Globulin, Total: 2.1 g/dL (ref 1.5–4.5)
Glucose: 101 mg/dL — ABNORMAL HIGH (ref 65–99)
Potassium: 4.5 mmol/L (ref 3.5–5.2)
Sodium: 142 mmol/L (ref 134–144)
Total Protein: 6.3 g/dL (ref 6.0–8.5)

## 2016-08-24 ENCOUNTER — Other Ambulatory Visit: Payer: Self-pay | Admitting: Family

## 2016-08-24 DIAGNOSIS — K2100 Gastro-esophageal reflux disease with esophagitis, without bleeding: Secondary | ICD-10-CM

## 2016-08-24 DIAGNOSIS — H2511 Age-related nuclear cataract, right eye: Secondary | ICD-10-CM | POA: Diagnosis not present

## 2016-08-24 DIAGNOSIS — H25041 Posterior subcapsular polar age-related cataract, right eye: Secondary | ICD-10-CM | POA: Diagnosis not present

## 2016-08-24 DIAGNOSIS — H25011 Cortical age-related cataract, right eye: Secondary | ICD-10-CM | POA: Diagnosis not present

## 2016-09-01 DIAGNOSIS — H25811 Combined forms of age-related cataract, right eye: Secondary | ICD-10-CM | POA: Diagnosis not present

## 2016-09-01 DIAGNOSIS — H2511 Age-related nuclear cataract, right eye: Secondary | ICD-10-CM | POA: Diagnosis not present

## 2016-09-01 DIAGNOSIS — H25011 Cortical age-related cataract, right eye: Secondary | ICD-10-CM | POA: Diagnosis not present

## 2016-10-09 ENCOUNTER — Other Ambulatory Visit: Payer: Self-pay | Admitting: Family

## 2016-10-09 NOTE — Telephone Encounter (Signed)
[facility] pt. Last filled 09/05/16, last seen 08/21/16. Route to pool for call in

## 2016-10-09 NOTE — Telephone Encounter (Signed)
If patient needs the fluoxetine today then we can refill that 1 but otherwise I would forward these to Lori Cooper should be back in on Monday

## 2016-10-09 NOTE — Telephone Encounter (Signed)
Patient aware that lorazepam has been called to pharmacy and [employer] will have to approve refill on fluoxetine

## 2016-10-20 ENCOUNTER — Ambulatory Visit (INDEPENDENT_AMBULATORY_CARE_PROVIDER_SITE_OTHER): Payer: PPO | Admitting: Family

## 2016-10-20 ENCOUNTER — Encounter: Payer: Self-pay | Admitting: Family

## 2016-10-20 VITALS — BP 137/77 | HR 74 | Temp 98.6°F | Ht 65.0 in | Wt 215.2 lb

## 2016-10-20 DIAGNOSIS — M5441 Lumbago with sciatica, right side: Secondary | ICD-10-CM

## 2016-10-20 MED ORDER — NAPROXEN 500 MG PO TABS: 500.0000 mg | ORAL_TABLET | Freq: Two times a day (BID) | ORAL | 0 refills | Status: DC

## 2016-10-20 MED ORDER — CYCLOBENZAPRINE HCL 10 MG PO TABS: ORAL_TABLET | ORAL | 2 refills | Status: DC

## 2016-10-20 MED ORDER — METHYLPREDNISOLONE ACETATE 80 MG/ML IJ SUSP
80.0000 mg | Freq: Once | INTRAMUSCULAR | Status: AC
  Administered 2016-10-20: 80 mg via INTRAMUSCULAR

## 2016-10-20 MED ORDER — KETOROLAC TROMETHAMINE 60 MG/2ML IM SOLN
60.0000 mg | Freq: Once | INTRAMUSCULAR | Status: AC
  Administered 2016-10-20: 60 mg via INTRAMUSCULAR

## 2016-10-20 NOTE — Progress Notes (Signed)
   Subjective:    Patient ID: Lori Cooper, female    DOB: 1955/12/21, 60 y.o.   MRN: [id number]  Back Pain  This is a recurrent problem. The current episode started in the past 7 days. The problem occurs constantly. The problem is unchanged. The pain is present in the gluteal. The pain does not radiate. The pain is at a severity of 7/10. The pain is moderate. The symptoms are aggravated by standing. Pertinent negatives include no bladder incontinence, bowel incontinence, dysuria, leg pain, numbness, tingling or weakness. Risk factors include sedentary lifestyle and obesity. She has tried bed rest, ice, muscle relaxant and NSAIDs for the symptoms. The treatment provided mild relief.      Review of Systems  Gastrointestinal: Negative for bowel incontinence.  Genitourinary: Negative for bladder incontinence and dysuria.  Musculoskeletal: Positive for back pain.  Neurological: Negative for tingling, weakness and numbness.  All other systems reviewed and are negative.      Objective:   Physical Exam  Constitutional: She is oriented to person, place, and time. She appears well-developed and well-nourished. No distress.  HENT:  Head: Normocephalic.  Cardiovascular: Normal rate, regular rhythm, normal heart sounds and intact distal pulses.   No murmur heard. Pulmonary/Chest: Effort normal and breath sounds normal. No respiratory distress. She has no wheezes.  Abdominal: Soft. Bowel sounds are normal. She exhibits no distension. There is no tenderness.  Musculoskeletal: Normal range of motion. She exhibits tenderness (right gluteal area, negative straight leg raise ). She exhibits no edema.  Neurological: She is alert and oriented to person, place, and time.  Skin: Skin is warm and dry.  Psychiatric: She has a normal mood and affect. Her behavior is normal. Judgment and thought content normal.  Vitals reviewed.   BP 137/77   Pulse 74   Temp 98.6 F (37 C) (Oral)   Ht 5\' 5"  (1.651 m)    Wt 215 lb 3.2 oz (97.6 kg)   BMI 35.81 kg/m        Assessment & Plan:  1. Acute right-sided low back pain with right-sided sciatica -Rest -Ice and heat -ROM exercises discussed- Handout given -RTO prn  - ketorolac (TORADOL) injection 60 mg; Inject 2 mLs (60 mg total) into the muscle once. - methylPREDNISolone acetate (DEPO-MEDROL) injection 80 mg; Inject 1 mL (80 mg total) into the muscle once. - cyclobenzaprine (FLEXERIL) 10 MG tablet; TAKE ONE TABLET BY MOUTH THREE TIMES DAILY AS NEEDED FOR MUSCLE SPASMS  Dispense: 60 tablet; Refill: 2 - naproxen (NAPROSYN) 500 MG tablet; Take 1 tablet (500 mg total) by mouth 2 (two) times daily with a meal.  Dispense: 30 tablet; Refill: 0  Evelina Dun, FNP

## 2016-10-20 NOTE — Patient Instructions (Signed)
Spondylolisthesis Rehab Ask your health care provider which exercises are safe for you. Do exercises exactly as told by your health care provider and adjust them as directed. It is normal to feel mild stretching, pulling, tightness, or discomfort as you do these exercises, but you should stop right away if you feel sudden pain or your pain gets worse. Do not begin these exercises until told by your health care provider. Stretching and range of motion exercises These exercises warm up your muscles and joints and improve the movement and flexibility of your hips and your back. These exercises may also help to relieve pain, numbness, and tingling. Exercise A: Single knee to chest 1. Lie on your back on a firm surface with both legs straight. 2. Bend one of your knees. Use your hands to move your knee up toward your chest until you feel a gentle stretch in your lower back and buttock.  Hold your leg in this position by holding onto the front of your knee.  Keep your other leg as straight as possible. 3. Hold for __________ seconds. 4. Slowly return to the starting position. 5. Repeat this exercise with your other leg. Repeat __________ times. Complete this exercise __________ times a day. Exercise B: Double knee to chest 1. Lie on your back on a firm surface with both legs straight. 2. Bend one of your knees and move it toward your chest until you feel a gentle stretch in your lower back and buttock. 3. Tense your abdominal muscles and repeat the previous step with your other leg. 4. Hold both of your legs in this position by holding onto the backs of your thighs or the fronts of your knees. 5. Hold for __________ seconds. 6. Tense your abdominal muscles and slowly move your legs back to the floor, one leg at a time. Repeat __________ times. Complete this exercise __________ times a day. Strengthening exercises These exercises build strength and endurance in your back. Endurance is the ability to  use your muscles for a long time, even after they get tired. Exercise C: Pelvic tilt 1. Lie on your back on a firm bed or the floor. Bend your knees and keep your feet flat. 2. Tense your abdominal muscles. Tip your pelvis up toward the ceiling and flatten your lower back into the floor.  To help with this exercise, you may place a small towel under your lower back and try to push your back into the towel. 3. Hold for __________ seconds. 4. Let your muscles relax completely before you repeat this exercise. Repeat __________ times. Complete this exercise __________ times a day. Exercise D: Abdominal crunch 1. Lie on your back on a firm surface. Bend your knees and keep your feet flat. Cross your arms over your chest. 2. Tuck your chin down toward your chest, without bending your neck. 3. Use your abdominal muscles to lift your upper body off of the ground, straight up into the air.  Try to lift yourself until your shoulder blades are off the ground. You may need to work up to this.  Keep your lower back on the ground while you crunch upward.  Do not hold your breath. 4. Slowly lower yourself down. Keep your abdominal muscles tense until you are back to the starting position. Repeat __________ times. Complete this exercise __________ times a day. Exercise E: Alternating arm and leg raises 1. Get on your hands and knees on a firm surface. If you are on a hard floor, you   may want to use padding to cushion your knees, such as an exercise mat. 2. Line up your arms and legs. Your hands should be below your shoulders, and your knees should be below your hips. 3. Lift your left leg behind you. At the same time, raise your right arm and straighten it in front of you.  Do not lift your leg higher than your hip.  Do not lift your arm higher than your shoulder.  Keep your abdominal and back muscles tight.  Keep your hips facing the ground.  Do not arch your back.  Keep your balance carefully,  and do not hold your breath. 4. Hold for __________ seconds. 5. Slowly return to the starting position and repeat with your right leg and your left arm. Repeat __________ times. Complete this exercise __________ times a day. Posture and body mechanics   Body mechanics refers to the movements and positions of your body while you do your daily activities. Posture is part of body mechanics. Good posture and healthy body mechanics can help to relieve stress in your body's tissues and joints. Good posture means that your spine is in its natural S-curve position (your spine is neutral), your shoulders are pulled back slightly, and your head is not tipped forward. The following are general guidelines for applying improved posture and body mechanics to your everyday activities. Standing   When standing, keep your spine neutral and your feet about hip-width apart. Keep a slight bend in your knees. Your ears, shoulders, and hips should line up.  When you do a task in which you stand in one place for a long time, place one foot up on a stable object that is 2-4 inches (5-10 cm) high, such as a footstool. This helps keep your spine neutral. Sitting  When sitting, keep your spine neutral and keep your feet flat on the floor. Use a footrest, if necessary, and keep your thighs parallel to the floor. Avoid rounding your shoulders, and avoid tilting your head forward.  When working at a desk or a computer, keep your desk at a height where your hands are slightly lower than your elbows. Slide your chair under your desk so you are close enough to maintain good posture.  When working at a computer, place your monitor at a height where you are looking straight ahead and you do not have to tilt your head forward or downward to look at the screen. Resting   When lying down and resting, avoid positions that are most painful for you.  If you have pain with activities such as sitting, bending, stooping, or squatting  (flexion-based activities), lie in a position in which your body does not bend very much. For example, avoid curling up on your side with your arms and knees near your chest (fetal position).  If you have pain with activities such as standing for a long time or reaching with your arms (extension-based activities), lie with your spine in a neutral position and bend your knees slightly. Try the following positions:  Lying on your side with a pillow between your knees.  Lying on your back with a pillow under your knees. Lifting   When lifting objects, keep your feet at least shoulder-width apart and tighten your abdominal muscles.  Bend your knees and hips and keep your spine neutral. It is important to lift using the strength of your legs, not your back. Do not lock your knees straight out.  Always ask for help to lift   heavy or awkward objects. This information is not intended to replace advice given to you by your health care provider. Make sure you discuss any questions you have with your health care provider. Document Released: 11/02/2005 Document Revised: 07/09/2016 Document Reviewed: 08/13/2015 Elsevier Interactive Patient Education  2017 [employer] Inc.  

## 2016-11-04 ENCOUNTER — Telehealth: Payer: Self-pay | Admitting: Family

## 2016-11-04 DIAGNOSIS — K429 Umbilical hernia without obstruction or gangrene: Secondary | ICD-10-CM

## 2016-11-04 NOTE — Telephone Encounter (Signed)
Referral placed.

## 2016-11-17 ENCOUNTER — Other Ambulatory Visit: Payer: Self-pay | Admitting: Family Medicine

## 2016-11-18 ENCOUNTER — Other Ambulatory Visit: Payer: Self-pay | Admitting: Family Medicine

## 2016-11-18 ENCOUNTER — Telehealth: Payer: Self-pay | Admitting: Family

## 2016-11-18 NOTE — Telephone Encounter (Signed)
Aware. Original script  Was not called in with refills. Refill given to [facility] pharmacist.

## 2016-11-18 NOTE — Telephone Encounter (Signed)
Aware, no refill due.

## 2016-11-18 NOTE — Telephone Encounter (Signed)
She got a prescription with 2 refills on 10/11/2016, she should still have plenty of refills

## 2016-11-18 NOTE — Telephone Encounter (Signed)
I already said no tenderness because she was given in November 3 months worth of prescriptions

## 2016-11-19 NOTE — Telephone Encounter (Signed)
Script in November had not been called in correctly.  [facility] aware to do refill.

## 2016-11-30 ENCOUNTER — Ambulatory Visit: Payer: Self-pay | Admitting: Surgery

## 2016-11-30 DIAGNOSIS — K439 Ventral hernia without obstruction or gangrene: Secondary | ICD-10-CM | POA: Diagnosis not present

## 2016-11-30 DIAGNOSIS — K429 Umbilical hernia without obstruction or gangrene: Secondary | ICD-10-CM | POA: Diagnosis not present

## 2016-11-30 DIAGNOSIS — Z01818 Encounter for other preprocedural examination: Secondary | ICD-10-CM | POA: Diagnosis not present

## 2016-11-30 NOTE — H&P (Signed)
Lori Cooper 11/30/2016 3:00 PM Location: [facility] Patient #: [id number] DOB: September 12, 1956 Married / Language: English / Race: White Female  History of Present Illness Ardeth Sportsman MD; 11/30/2016 3:36 PM) The patient is a 61 year old female who presents with an abdominal wall hernia. Note for "Abdominal hernia": Patient sent for surgical consultation by her primary care physician, Dr. Nickola Major. Concern for periumbilical abdominal wall hernias.  Pleasant woman with chronic back pain and sciatica. She was sent to me in 2015 with a concern of hernia near bellybutton. I thought she had 2 hernias, one supraumbilical one umbilical. Offered consideration of hernia repair. She wasn't severely symptomatic at that time. She wish to hold off until her financial situation was in a better place.  She notes now however the hernias persist. It ultimately is reducible but is slightly more sensitive than when I saw her years ago. She recalls having a laparoscopic cholecystectomy and tubal ligation. No other abdominal surgeries. She is rate abstinent from smoking for over 4 years. Still living with her disabled and retired has been. No other new events. She moves her bowels on a daily basis. Can walk at least a half hour if not longer without difficulty. No exertional chest pain or shortness of breath. No history of skin infections or urinary tract infections.   Past Surgical History Timmothy Euler, [place]; 11/30/2016 3:00 PM) Cataract Surgery Bilateral. Colon Polyp Removal - Colonoscopy Gallbladder Surgery - Laparoscopic Shoulder Surgery Left. Tonsillectomy  Diagnostic Studies History Timmothy Euler, [place]; 11/30/2016 3:00 PM) Colonoscopy 5-10 years ago Mammogram within last year Pap Smear 1-5 years ago  Allergies Timmothy Euler, [place]; 11/30/2016 3:01 PM) No Known Drug Allergies 11/30/2016 Allergies Reconciled  Medication History Timmothy Euler, CMA; 11/30/2016 3:04  PM) Aspirin (81MG  Tablet, Oral daily) Active. Wellbutrin SR (150MG  Tablet ER, Oral daily) Active. Lipitor (40MG  Tablet, Oral daily) Active. Vitamin D (Cholecalciferol) (1000UNIT Capsule, Oral daily) Active. Flexeril (10MG  Tablet, Oral as needed) Active. PROzac (40MG  Capsule, Oral daily) Active. Ativan (0.5MG  Tablet, Oral as needed) Active. Protonix (40MG  Tablet DR, Oral daily) Active. Phentermine HCl (37.5MG  Capsule, Oral daily) Active. Medications Reconciled  Social History Timmothy Euler, [place]; 11/30/2016 3:00 PM) Alcohol use Occasional alcohol use. Caffeine use Carbonated beverages, Coffee. No drug use Tobacco use Former smoker.  Family History Timmothy Euler, [place]; 11/30/2016 3:00 PM) Alcohol Abuse Brother, Daughter. Arthritis Father, Mother, Sister. Cervical Cancer Sister. Depression Father, Mother, Sister. Diabetes Mellitus Mother.  Pregnancy / Birth History Timmothy Euler, [place]; 11/30/2016 3:00 PM) Age at menarche 13 years. Age of menopause 4-50 Gravida 2 Maternal age 40-20 Para 2  Other Problems Timmothy Euler, [place]; 11/30/2016 3:00 PM) Anxiety Disorder Arthritis Back Pain Cholelithiasis Depression Gastroesophageal Reflux Disease Hemorrhoids Hypercholesterolemia Melanoma Umbilical Hernia Repair     Review of Systems Timmothy Euler CMA; 11/30/2016 3:00 PM) General Present- Weight Gain. Not Present- Appetite Loss, Chills, Fatigue, Fever, Night Sweats and Weight Loss. Skin Present- Dryness. Not Present- Change in Wart/Mole, Hives, Jaundice, New Lesions, Non-Healing Wounds, Rash and Ulcer. HEENT Present- Ringing in the Ears. Not Present- Earache, Hearing Loss, Hoarseness, Nose Bleed, Oral Ulcers, Seasonal Allergies, Sinus Pain, Sore Throat, Visual Disturbances, Wears glasses/contact lenses and Yellow Eyes. Respiratory Present- Snoring. Not Present- Bloody sputum, Chronic Cough, Difficulty Breathing and Wheezing. Cardiovascular Not  Present- Chest Pain, Difficulty Breathing Lying Down, Leg Cramps, Palpitations, Rapid Heart Rate, Shortness of Breath and Swelling of Extremities. Gastrointestinal Present- Change in Bowel Habits and Constipation. Not Present- Abdominal Pain, Bloating, Bloody Stool, Chronic  diarrhea, Difficulty Swallowing, Excessive gas, Gets full quickly at meals, Hemorrhoids, Indigestion, Nausea, Rectal Pain and Vomiting. Female Genitourinary Present- Urgency. Not Present- Frequency, Nocturia, Painful Urination and Pelvic Pain. Musculoskeletal Present- Back Pain, Joint Pain, Joint Stiffness, Muscle Pain and Muscle Weakness. Not Present- Swelling of Extremities. Neurological Present- Decreased Memory. Not Present- Fainting, Headaches, Numbness, Seizures, Tingling, Tremor, Trouble walking and Weakness. Psychiatric Present- Anxiety and Depression. Not Present- Bipolar, Change in Sleep Pattern, Fearful and Frequent crying. Endocrine Not Present- Cold Intolerance, Excessive Hunger, Hair Changes, Heat Intolerance, Hot flashes and New Diabetes. Hematology Not Present- Blood Thinners, Easy Bruising, Excessive bleeding, Gland problems, HIV and Persistent Infections.  Vitals Barron Alvine Bradford CMA; 11/30/2016 3:05 PM) 11/30/2016 3:04 PM Weight: 215.8 lb Height: 65in Body Surface Area: 2.04 m Body Mass Index: 35.91 kg/m  Temp.: 98.70F  Pulse: 82 (Regular)  BP: 122/68 (Sitting, Left Arm, Standard)      Physical Exam Ardeth Sportsman MD; 11/30/2016 3:29 PM)  General Mental Status-Alert. General Appearance-Not in acute distress, Not Sickly. Orientation-Oriented X3. Hydration-Well hydrated. Voice-Normal.  Integumentary Global Assessment Upon inspection and palpation of skin surfaces of the - Axillae: non-tender, no inflammation or ulceration, no drainage. and Distribution of scalp and body hair is normal. General Characteristics Temperature - normal warmth is noted.  Head and  Neck Head-normocephalic, atraumatic with no lesions or palpable masses. Face Global Assessment - atraumatic, no absence of expression. Neck Global Assessment - no abnormal movements, no bruit auscultated on the right, no bruit auscultated on the left, no decreased range of motion, non-tender. Trachea-midline. Thyroid Gland Characteristics - non-tender.  Eye Eyeball - Left-Extraocular movements intact, No Nystagmus. Eyeball - Right-Extraocular movements intact, No Nystagmus. Cornea - Left-No Hazy. Cornea - Right-No Hazy. Sclera/Conjunctiva - Left-No scleral icterus, No Discharge. Sclera/Conjunctiva - Right-No scleral icterus, No Discharge. Pupil - Left-Direct reaction to light normal. Pupil - Right-Direct reaction to light normal.  ENMT Ears Pinna - Left - no drainage observed, no generalized tenderness observed. Right - no drainage observed, no generalized tenderness observed. Nose and Sinuses External Inspection of the Nose - no destructive lesion observed. Inspection of the nares - Left - quiet respiration. Right - quiet respiration. Mouth and Throat Lips - Upper Lip - no fissures observed, no pallor noted. Lower Lip - no fissures observed, no pallor noted. Nasopharynx - no discharge present. Oral Cavity/Oropharynx - Tongue - no dryness observed. Oral Mucosa - no cyanosis observed. Hypopharynx - no evidence of airway distress observed.  Chest and Lung Exam Inspection Movements - Normal and Symmetrical. Accessory muscles - No use of accessory muscles in breathing. Palpation Palpation of the chest reveals - Non-tender. Auscultation Breath sounds - Normal and Clear.  Cardiovascular Auscultation Rhythm - Regular. Murmurs & Other Heart Sounds - Auscultation of the heart reveals - No Murmurs and No Systolic Clicks.  Abdomen Inspection Inspection of the abdomen reveals - No Visible peristalsis and No Abnormal pulsations. Umbilicus - No Bleeding, No Urine  drainage. Palpation/Percussion Palpation and Percussion of the abdomen reveal - Soft, Non Tender, No Rebound tenderness, No Rigidity (guarding) and No Cutaneous hyperesthesia. Note: Supraumbilical mass about 3 cm reducible consistent with supraumbilical hernia. Small but definite impulse at bellybutton around 1 cm.  Abdomen soft. Nontender, nondistended. No guarding. No diastasis. No other hernias  Female Genitourinary Sexual Maturity Tanner 5 - Adult hair pattern. Note: No vaginal bleeding nor discharge  Peripheral Vascular Upper Extremity Inspection - Left - No Cyanotic nailbeds, Not Ischemic. Right - No Cyanotic nailbeds, Not Ischemic.  Neurologic Neurologic evaluation reveals -normal attention span and ability to concentrate, able to name objects and repeat phrases. Appropriate fund of knowledge , normal sensation and normal coordination. Mental Status Affect - not angry, not paranoid. Cranial Nerves-Normal Bilaterally. Gait-Normal.  Neuropsychiatric Mental status exam performed with findings of-able to articulate well with normal speech/language, rate, volume and coherence, thought content normal with ability to perform basic computations and apply abstract reasoning and no evidence of hallucinations, delusions, obsessions or homicidal/suicidal ideation.  Musculoskeletal Global Assessment Spine, Ribs and Pelvis - no instability, subluxation or laxity. Right Upper Extremity - no instability, subluxation or laxity.  Lymphatic Head & Neck  General Head & Neck Lymphatics: Bilateral - Description - No Localized lymphadenopathy. Axillary  General Axillary Region: Bilateral - Description - No Localized lymphadenopathy. Femoral & Inguinal  Generalized Femoral & Inguinal Lymphatics: Left - Description - No Localized lymphadenopathy. Right - Description - No Localized lymphadenopathy.    Assessment & Plan Ardeth Sportsman MD; 11/30/2016 3:32 PM)  EPIGASTRIC HERNIA  (K43.9) Impression: Epigastric hernia reducible but sensitive.  She is ready to consider surgical repair.  Given her obesity and relatively young age, would recommend mesh underlay reinforcement repair. I showed her a sample of what is typically put in. She wanted to make sure it was MRI compatible which I reassured her was. She is interested in proceeding  UMBILICAL HERNIA WITHOUT OBSTRUCTION AND WITHOUT GANGRENE (K42.9) Impression: Small hernia through umbilical stalk nearby. Stable. Would repair at the same time of surgery.  PREOP - [facility] - ENCOUNTER FOR PREOPERATIVE EXAMINATION FOR GENERAL SURGICAL PROCEDURE (Z01.818)  Current Plans You are being scheduled for surgery- Our schedulers will call you.  You should hear from our office's scheduling department within 5 working days about the location, date, and time of surgery. We try to make accommodations for patient's preferences in scheduling surgery, but sometimes the OR schedule or the surgeon's schedule prevents [place] from making those accommodations.  If you have not heard from our office [phone]) in 5 working days, call the office and ask for your surgeon's nurse.  If you have other questions about your diagnosis, plan, or surgery, call the office and ask for your surgeon's nurse.  Written instructions provided The anatomy & physiology of the abdominal wall was discussed. The pathophysiology of hernias was discussed. Natural history risks without surgery including progeressive enlargement, pain, incarceration, & strangulation was discussed. Contributors to complications such as smoking, obesity, diabetes, prior surgery, etc were discussed.  I feel the risks of no intervention will lead to serious problems that outweigh the operative risks; therefore, I recommended surgery to reduce and repair the hernia. I explained laparoscopic techniques with possible need for an open approach. I noted the probable use of mesh to patch  and/or buttress the hernia repair  Risks such as bleeding, infection, abscess, need for further treatment, heart attack, death, and other risks were discussed. I noted a good likelihood this will help address the problem. Goals of post-operative recovery were discussed as well. Possibility that this will not correct all symptoms was explained. I stressed the importance of low-impact activity, aggressive pain control, avoiding constipation, & not pushing through pain to minimize risk of post-operative chronic pain or injury. Possibility of reherniation especially with smoking, obesity, diabetes, immunosuppression, and other health conditions was discussed. We will work to minimize complications.  An educational handout further explaining the pathology & treatment options was given as well. Questions were answered. The patient expresses understanding & wishes to proceed  with surgery.  Pt Education - [facility] Hernia Post-Op HCI (Tashiana Lamarca): discussed with patient and provided information. Pt Education - [facility] Pain Control (Isadore Palecek) Pt Education - Pamphlet Given - Laparoscopic Hernia Repair: discussed with patient and provided information.

## 2016-12-25 ENCOUNTER — Encounter (HOSPITAL_BASED_OUTPATIENT_CLINIC_OR_DEPARTMENT_OTHER): Payer: Self-pay | Admitting: *Deleted

## 2016-12-25 NOTE — Progress Notes (Signed)
NPO AFTER MN.  ARRIVE AT 0930.  NEEDS HG.  WILL TAKE WELLBUTRIN AM DOS W/ SIPS OF WATER .  WILL DO HIBICLEANS SHOWER HS BEFORE AND AM DOS .

## 2016-12-31 ENCOUNTER — Ambulatory Visit (HOSPITAL_BASED_OUTPATIENT_CLINIC_OR_DEPARTMENT_OTHER)
Admission: RE | Admit: 2016-12-31 | Discharge: 2016-12-31 | Disposition: A | Discharge status: Home / Self Care | Payer: PPO | Source: Ambulatory Visit | Attending: Surgery | Admitting: Surgery

## 2016-12-31 ENCOUNTER — Encounter (HOSPITAL_BASED_OUTPATIENT_CLINIC_OR_DEPARTMENT_OTHER)
Admission: RE | Disposition: A | Discharge status: Home / Self Care | Payer: Self-pay | Source: Ambulatory Visit | Attending: Surgery

## 2016-12-31 ENCOUNTER — Ambulatory Visit (HOSPITAL_BASED_OUTPATIENT_CLINIC_OR_DEPARTMENT_OTHER): Payer: PPO | Admitting: Anesthesiology

## 2016-12-31 ENCOUNTER — Encounter (HOSPITAL_BASED_OUTPATIENT_CLINIC_OR_DEPARTMENT_OTHER): Payer: Self-pay

## 2016-12-31 DIAGNOSIS — K429 Umbilical hernia without obstruction or gangrene: Secondary | ICD-10-CM | POA: Diagnosis not present

## 2016-12-31 DIAGNOSIS — K430 Incisional hernia with obstruction, without gangrene: Secondary | ICD-10-CM | POA: Insufficient documentation

## 2016-12-31 DIAGNOSIS — M5430 Sciatica, unspecified side: Secondary | ICD-10-CM | POA: Diagnosis not present

## 2016-12-31 DIAGNOSIS — Z85820 Personal history of malignant melanoma of skin: Secondary | ICD-10-CM | POA: Insufficient documentation

## 2016-12-31 DIAGNOSIS — G8929 Other chronic pain: Secondary | ICD-10-CM | POA: Diagnosis not present

## 2016-12-31 DIAGNOSIS — Z7982 Long term (current) use of aspirin: Secondary | ICD-10-CM | POA: Diagnosis not present

## 2016-12-31 DIAGNOSIS — Z6836 Body mass index (BMI) 36.0-36.9, adult: Secondary | ICD-10-CM | POA: Diagnosis not present

## 2016-12-31 DIAGNOSIS — K449 Diaphragmatic hernia without obstruction or gangrene: Secondary | ICD-10-CM | POA: Diagnosis not present

## 2016-12-31 DIAGNOSIS — K439 Ventral hernia without obstruction or gangrene: Secondary | ICD-10-CM | POA: Diagnosis not present

## 2016-12-31 DIAGNOSIS — E669 Obesity, unspecified: Secondary | ICD-10-CM | POA: Insufficient documentation

## 2016-12-31 DIAGNOSIS — M1990 Unspecified osteoarthritis, unspecified site: Secondary | ICD-10-CM | POA: Diagnosis not present

## 2016-12-31 DIAGNOSIS — K219 Gastro-esophageal reflux disease without esophagitis: Secondary | ICD-10-CM | POA: Insufficient documentation

## 2016-12-31 DIAGNOSIS — K432 Incisional hernia without obstruction or gangrene: Secondary | ICD-10-CM | POA: Diagnosis present

## 2016-12-31 DIAGNOSIS — E7800 Pure hypercholesterolemia, unspecified: Secondary | ICD-10-CM | POA: Diagnosis not present

## 2016-12-31 DIAGNOSIS — F419 Anxiety disorder, unspecified: Secondary | ICD-10-CM | POA: Diagnosis not present

## 2016-12-31 DIAGNOSIS — F329 Major depressive disorder, single episode, unspecified: Secondary | ICD-10-CM | POA: Insufficient documentation

## 2016-12-31 DIAGNOSIS — F418 Other specified anxiety disorders: Secondary | ICD-10-CM | POA: Diagnosis not present

## 2016-12-31 HISTORY — DX: Presence of dental prosthetic device (complete) (partial): Z97.2

## 2016-12-31 HISTORY — DX: Personal history of colonic polyps: Z86.010

## 2016-12-31 HISTORY — DX: Umbilical hernia without obstruction or gangrene: K42.9

## 2016-12-31 HISTORY — DX: Other intervertebral disc degeneration, lumbosacral region without mention of lumbar back pain or lower extremity pain: M51.379

## 2016-12-31 HISTORY — DX: Diaphragmatic hernia without obstruction or gangrene: K44.9

## 2016-12-31 HISTORY — DX: Personal history of adenomatous and serrated colon polyps: Z86.0101

## 2016-12-31 HISTORY — DX: Restless legs syndrome: G25.81

## 2016-12-31 HISTORY — DX: Ventral hernia without obstruction or gangrene: K43.9

## 2016-12-31 HISTORY — DX: Unspecified osteoarthritis, unspecified site: M19.90

## 2016-12-31 HISTORY — DX: Other chronic pain: G89.29

## 2016-12-31 HISTORY — DX: Other cervical disc degeneration, unspecified cervical region: M50.30

## 2016-12-31 HISTORY — DX: Dorsalgia, unspecified: M54.9

## 2016-12-31 HISTORY — PX: VENTRAL HERNIA REPAIR: SHX424

## 2016-12-31 HISTORY — DX: Other intervertebral disc degeneration, lumbosacral region: M51.37

## 2016-12-31 HISTORY — DX: Personal history of other diseases of the digestive system: Z87.19

## 2016-12-31 HISTORY — DX: Personal history of (healed) traumatic fracture: Z87.81

## 2016-12-31 HISTORY — DX: Personal history of other specified conditions: Z87.898

## 2016-12-31 LAB — POCT HEMOGLOBIN-HEMACUE: Hemoglobin: 12.9 g/dL (ref 12.0–15.0)

## 2016-12-31 SURGERY — REPAIR, HERNIA, VENTRAL, LAPAROSCOPIC
Anesthesia: General | Site: Abdomen

## 2016-12-31 MED ORDER — CEFAZOLIN SODIUM-DEXTROSE 2-4 GM/100ML-% IV SOLN
2.0000 g | INTRAVENOUS | Status: AC
  Administered 2016-12-31: 2 g via INTRAVENOUS
  Filled 2016-12-31: qty 100

## 2016-12-31 MED ORDER — HYDROMORPHONE HCL 2 MG/ML IJ SOLN
INTRAMUSCULAR | Status: AC
  Filled 2016-12-31: qty 1

## 2016-12-31 MED ORDER — FENTANYL CITRATE (PF) 100 MCG/2ML IJ SOLN
INTRAMUSCULAR | Status: DC | PRN
Start: 2016-12-31 — End: 2016-12-31
  Administered 2016-12-31: 25 ug via INTRAVENOUS
  Administered 2016-12-31: 50 ug via INTRAVENOUS
  Administered 2016-12-31: 25 ug via INTRAVENOUS
  Administered 2016-12-31 (×2): 50 ug via INTRAVENOUS

## 2016-12-31 MED ORDER — SODIUM CHLORIDE 0.9 % IV SOLN
250.0000 mL | INTRAVENOUS | Status: DC | PRN
  Filled 2016-12-31: qty 250

## 2016-12-31 MED ORDER — ONDANSETRON HCL 4 MG/2ML IJ SOLN
INTRAMUSCULAR | Status: DC | PRN
  Administered 2016-12-31: 4 mg via INTRAVENOUS

## 2016-12-31 MED ORDER — DEXAMETHASONE SODIUM PHOSPHATE 10 MG/ML IJ SOLN
INTRAMUSCULAR | Status: AC
  Filled 2016-12-31: qty 1

## 2016-12-31 MED ORDER — BUPIVACAINE HCL (PF) 0.25 % IJ SOLN
INTRAMUSCULAR | Status: AC
  Filled 2016-12-31: qty 30

## 2016-12-31 MED ORDER — ACETAMINOPHEN 500 MG PO TABS
1000.0000 mg | ORAL_TABLET | Freq: Three times a day (TID) | ORAL | Status: DC
  Filled 2016-12-31: qty 2

## 2016-12-31 MED ORDER — OXYCODONE HCL 5 MG PO TABS
5.0000 mg | ORAL_TABLET | ORAL | Status: DC | PRN
  Administered 2016-12-31: 5 mg via ORAL
  Filled 2016-12-31: qty 2

## 2016-12-31 MED ORDER — BUPIVACAINE LIPOSOME 1.3 % IJ SUSP
INTRAMUSCULAR | Status: DC | PRN
  Administered 2016-12-31: 20 mL

## 2016-12-31 MED ORDER — CHLORHEXIDINE GLUCONATE CLOTH 2 % EX PADS
6.0000 | MEDICATED_PAD | Freq: Once | CUTANEOUS | Status: DC
  Filled 2016-12-31: qty 6

## 2016-12-31 MED ORDER — OXYCODONE HCL 5 MG PO TABS
ORAL_TABLET | ORAL | Status: AC
  Filled 2016-12-31: qty 1

## 2016-12-31 MED ORDER — LACTATED RINGERS IV SOLN
INTRAVENOUS | Status: DC
  Administered 2016-12-31 (×2): via INTRAVENOUS
  Filled 2016-12-31: qty 1000

## 2016-12-31 MED ORDER — FENTANYL CITRATE (PF) 100 MCG/2ML IJ SOLN
INTRAMUSCULAR | Status: AC
  Filled 2016-12-31: qty 2

## 2016-12-31 MED ORDER — SODIUM CHLORIDE 0.9% FLUSH
3.0000 mL | INTRAVENOUS | Status: DC | PRN
  Filled 2016-12-31: qty 3

## 2016-12-31 MED ORDER — SCOPOLAMINE 1 MG/3DAYS TD PT72
MEDICATED_PATCH | TRANSDERMAL | Status: AC
  Filled 2016-12-31: qty 1

## 2016-12-31 MED ORDER — PROPOFOL 10 MG/ML IV BOLUS
INTRAVENOUS | Status: DC | PRN
  Administered 2016-12-31: 40 mg via INTRAVENOUS
  Administered 2016-12-31: 160 mg via INTRAVENOUS

## 2016-12-31 MED ORDER — LIDOCAINE 2% (20 MG/ML) 5 ML SYRINGE
INTRAMUSCULAR | Status: DC | PRN
  Administered 2016-12-31: 80 mg via INTRAVENOUS

## 2016-12-31 MED ORDER — CEFAZOLIN SODIUM-DEXTROSE 2-4 GM/100ML-% IV SOLN
INTRAVENOUS | Status: AC
  Filled 2016-12-31: qty 100

## 2016-12-31 MED ORDER — DEXAMETHASONE SODIUM PHOSPHATE 4 MG/ML IJ SOLN
INTRAMUSCULAR | Status: DC | PRN
  Administered 2016-12-31: 10 mg via INTRAVENOUS

## 2016-12-31 MED ORDER — MIDAZOLAM HCL 5 MG/5ML IJ SOLN
INTRAMUSCULAR | Status: DC | PRN
  Administered 2016-12-31: 2 mg via INTRAVENOUS

## 2016-12-31 MED ORDER — BUPIVACAINE-EPINEPHRINE 0.25% -1:200000 IJ SOLN
INTRAMUSCULAR | Status: DC | PRN
  Administered 2016-12-31: 30 mL

## 2016-12-31 MED ORDER — ACETAMINOPHEN 500 MG PO TABS
ORAL_TABLET | ORAL | Status: AC
  Filled 2016-12-31: qty 2

## 2016-12-31 MED ORDER — PROMETHAZINE HCL 25 MG/ML IJ SOLN
6.2500 mg | INTRAMUSCULAR | Status: DC | PRN
  Filled 2016-12-31: qty 1

## 2016-12-31 MED ORDER — PROPOFOL 10 MG/ML IV BOLUS
INTRAVENOUS | Status: AC
  Filled 2016-12-31: qty 20

## 2016-12-31 MED ORDER — MIDAZOLAM HCL 2 MG/2ML IJ SOLN
INTRAMUSCULAR | Status: AC
  Filled 2016-12-31: qty 2

## 2016-12-31 MED ORDER — BUPIVACAINE LIPOSOME 1.3 % IJ SUSP
20.0000 mL | INTRAMUSCULAR | Status: DC
Start: 2016-12-31 — End: 2016-12-31
  Filled 2016-12-31: qty 20

## 2016-12-31 MED ORDER — ROCURONIUM BROMIDE 50 MG/5ML IV SOSY
PREFILLED_SYRINGE | INTRAVENOUS | Status: AC
  Filled 2016-12-31: qty 5

## 2016-12-31 MED ORDER — SODIUM CHLORIDE 0.9% FLUSH
3.0000 mL | Freq: Two times a day (BID) | INTRAVENOUS | Status: DC
  Filled 2016-12-31: qty 3

## 2016-12-31 MED ORDER — LIDOCAINE 2% (20 MG/ML) 5 ML SYRINGE
INTRAMUSCULAR | Status: AC
  Filled 2016-12-31: qty 5

## 2016-12-31 MED ORDER — EPINEPHRINE PF 1 MG/ML IJ SOLN
INTRAMUSCULAR | Status: AC
  Filled 2016-12-31: qty 1

## 2016-12-31 MED ORDER — FENTANYL CITRATE (PF) 100 MCG/2ML IJ SOLN
25.0000 ug | INTRAMUSCULAR | Status: DC | PRN
  Filled 2016-12-31: qty 1

## 2016-12-31 MED ORDER — HYDROMORPHONE HCL 1 MG/ML IJ SOLN
0.2500 mg | INTRAMUSCULAR | Status: DC | PRN
  Administered 2016-12-31: 0.2 mg via INTRAVENOUS
  Administered 2016-12-31 (×2): 0.25 mg via INTRAVENOUS
  Filled 2016-12-31: qty 0.5

## 2016-12-31 MED ORDER — OXYCODONE HCL 5 MG PO TABS: 5.0000 mg | ORAL_TABLET | ORAL | 0 refills | Status: DC | PRN

## 2016-12-31 MED ORDER — ACETAMINOPHEN 500 MG PO TABS
1000.0000 mg | ORAL_TABLET | ORAL | Status: AC
  Administered 2016-12-31: 1000 mg via ORAL
  Filled 2016-12-31: qty 2

## 2016-12-31 MED ORDER — GABAPENTIN 300 MG PO CAPS
300.0000 mg | ORAL_CAPSULE | ORAL | Status: AC
  Administered 2016-12-31: 300 mg via ORAL
  Filled 2016-12-31: qty 1

## 2016-12-31 MED ORDER — BUPIVACAINE LIPOSOME 1.3 % IJ SUSP
INTRAMUSCULAR | Status: AC
Start: 2016-12-31 — End: 2016-12-31
  Filled 2016-12-31: qty 20

## 2016-12-31 MED ORDER — PHENYLEPHRINE 40 MCG/ML (10ML) SYRINGE FOR IV PUSH (FOR BLOOD PRESSURE SUPPORT)
PREFILLED_SYRINGE | INTRAVENOUS | Status: AC
  Filled 2016-12-31: qty 10

## 2016-12-31 MED ORDER — GABAPENTIN 300 MG PO CAPS
ORAL_CAPSULE | ORAL | Status: AC
  Filled 2016-12-31: qty 1

## 2016-12-31 MED ORDER — CELECOXIB 400 MG PO CAPS
400.0000 mg | ORAL_CAPSULE | ORAL | Status: AC
  Administered 2016-12-31: 400 mg via ORAL
  Filled 2016-12-31: qty 1

## 2016-12-31 MED ORDER — SUGAMMADEX SODIUM 200 MG/2ML IV SOLN
INTRAVENOUS | Status: DC | PRN
  Administered 2016-12-31: 200 mg via INTRAVENOUS

## 2016-12-31 MED ORDER — SCOPOLAMINE 1 MG/3DAYS TD PT72
1.0000 | MEDICATED_PATCH | TRANSDERMAL | Status: DC
  Administered 2016-12-31: 1.5 mg via TRANSDERMAL
  Filled 2016-12-31: qty 1

## 2016-12-31 MED ORDER — ROCURONIUM BROMIDE 100 MG/10ML IV SOLN
INTRAVENOUS | Status: DC | PRN
  Administered 2016-12-31: 15 mg via INTRAVENOUS
  Administered 2016-12-31 (×2): 10 mg via INTRAVENOUS
  Administered 2016-12-31: 35 mg via INTRAVENOUS

## 2016-12-31 MED ORDER — SODIUM CHLORIDE 0.9 % IJ SOLN
INTRAMUSCULAR | Status: DC | PRN
  Administered 2016-12-31: 50 mL via INTRAVENOUS

## 2016-12-31 MED ORDER — ONDANSETRON HCL 4 MG/2ML IJ SOLN
INTRAMUSCULAR | Status: AC
  Filled 2016-12-31: qty 2

## 2016-12-31 MED ORDER — SODIUM CHLORIDE 0.9 % IJ SOLN
INTRAMUSCULAR | Status: AC
  Filled 2016-12-31: qty 50

## 2016-12-31 MED ORDER — CELECOXIB 200 MG PO CAPS
ORAL_CAPSULE | ORAL | Status: AC
  Filled 2016-12-31: qty 2

## 2016-12-31 MED ORDER — PHENYLEPHRINE 40 MCG/ML (10ML) SYRINGE FOR IV PUSH (FOR BLOOD PRESSURE SUPPORT)
PREFILLED_SYRINGE | INTRAVENOUS | Status: DC | PRN
  Administered 2016-12-31: 80 ug via INTRAVENOUS

## 2016-12-31 SURGICAL SUPPLY — 48 items
APPLIER CLIP 5 13 M/L LIGAMAX5 (MISCELLANEOUS)
APR CLP MED LRG 5 ANG JAW (MISCELLANEOUS)
BINDER ABDOMINAL 12 ML 46-62 (SOFTGOODS) ×2 IMPLANT
BLADE SURG 11 STRL SS (BLADE) ×2 IMPLANT
CABLE HIGH FREQUENCY MONO STRZ (ELECTRODE) ×2 IMPLANT
CANISTER SUCTION 2500CC (MISCELLANEOUS) ×2 IMPLANT
CHLORAPREP W/TINT 26ML (MISCELLANEOUS) ×2 IMPLANT
CLIP APPLIE 5 13 M/L LIGAMAX5 (MISCELLANEOUS) IMPLANT
COVER BACK TABLE 60X90IN (DRAPES) ×2 IMPLANT
COVER MAYO STAND STRL (DRAPES) ×2 IMPLANT
DECANTER SPIKE VIAL GLASS SM (MISCELLANEOUS) ×2 IMPLANT
DEVICE SECURE STRAP 25 ABSORB (INSTRUMENTS) ×2 IMPLANT
DEVICE TROCAR PUNCTURE CLOSURE (ENDOMECHANICALS) ×2 IMPLANT
DRAPE LAPAROSCOPIC ABDOMINAL (DRAPES) ×2 IMPLANT
DRAPE UTILITY XL STRL (DRAPES) ×2 IMPLANT
DRAPE WARM FLUID 44X44 (DRAPE) ×2 IMPLANT
DRSG TEGADERM 2-3/8X2-3/4 SM (GAUZE/BANDAGES/DRESSINGS) ×2 IMPLANT
DRSG TEGADERM 4X4.75 (GAUZE/BANDAGES/DRESSINGS) ×2 IMPLANT
ELECT REM PT RETURN 9FT ADLT (ELECTROSURGICAL) ×2
ELECTRODE REM PT RTRN 9FT ADLT (ELECTROSURGICAL) ×1 IMPLANT
GLOVE ECLIPSE 8.0 STRL XLNG CF (GLOVE) ×2 IMPLANT
GLOVE INDICATOR 8.0 STRL GRN (GLOVE) ×2 IMPLANT
GOWN STRL REUS W/TWL XL LVL3 (GOWN DISPOSABLE) ×2 IMPLANT
IRRIG SUCT STRYKERFLOW 2 WTIP (MISCELLANEOUS) ×2
IRRIGATION SUCT STRKRFLW 2 WTP (MISCELLANEOUS) ×1 IMPLANT
KIT ROOM TURNOVER WOR (KITS) ×2 IMPLANT
MESH VENTRALIGHT ST 8X10 (Mesh General) ×2 IMPLANT
NEEDLE HYPO 22GX1.5 SAFETY (NEEDLE) ×4 IMPLANT
NEEDLE SPNL 22GX3.5 QUINCKE BK (NEEDLE) IMPLANT
NS IRRIG 500ML POUR BTL (IV SOLUTION) ×2 IMPLANT
PACK BASIN DAY SURGERY FS (CUSTOM PROCEDURE TRAY) ×2 IMPLANT
PAD POSITIONING PINK XL (MISCELLANEOUS) ×2 IMPLANT
SCISSORS LAP 5X35 DISP (ENDOMECHANICALS) ×2 IMPLANT
SET IRRIG TUBING LAPAROSCOPIC (IRRIGATION / IRRIGATOR) IMPLANT
SHEARS HARMONIC ACE PLUS 36CM (ENDOMECHANICALS) IMPLANT
SPONGE GAUZE 2X2 8PLY STRL LF (GAUZE/BANDAGES/DRESSINGS) ×6 IMPLANT
STRIP CLOSURE SKIN 1/2X4 (GAUZE/BANDAGES/DRESSINGS) ×2 IMPLANT
SUT MNCRL AB 4-0 PS2 18 (SUTURE) ×2 IMPLANT
SUT PDS AB 1 CT1 27 (SUTURE) ×4 IMPLANT
SUT PROLENE 1 CT 1 30 (SUTURE) ×14 IMPLANT
SUT VIC AB 2-0 SH 27 (SUTURE)
SUT VIC AB 2-0 SH 27X BRD (SUTURE) IMPLANT
SYR 20CC LL (SYRINGE) ×4 IMPLANT
TOWEL OR 17X24 6PK STRL BLUE (TOWEL DISPOSABLE) ×2 IMPLANT
TROCAR BLADELESS OPT 5 100 (ENDOMECHANICALS) ×6 IMPLANT
TROCAR XCEL NON-BLD 11X100MML (ENDOMECHANICALS) IMPLANT
TUBING INSUF HEATED (TUBING) ×2 IMPLANT
WATER STERILE IRR 500ML POUR (IV SOLUTION) ×2 IMPLANT

## 2016-12-31 NOTE — H&P (Signed)
Lori Cooper 11/30/2016 3:00 PM Location: [facility] Patient #: [id number] DOB: 28-Mar-1956 Married / Language: English / Race: White Female  Patient Care Team: Junie Spencer, FNP as PCP - General (Nurse Practitioner) Meryl Dare, MD as Consulting Physician (Gastroenterology) Gean Birchwood, MD as Consulting Physician (Orthopedic Surgery) Dolores Patty, MD as Consulting Physician (Cardiology) Karie Soda, MD as Consulting Physician (General Surgery)   History of Present Illness  The patient is a 61 year old female who presents with an abdominal wall hernia.   Note for "Abdominal hernia": Patient sent for surgical consultation by her primary care physician, Dr. Nickola Major. Concern for periumbilical abdominal wall hernias.  Pleasant woman with chronic back pain and sciatica. She was sent to me in 2015 with a concern of hernia near bellybutton. I thought she had 2 hernias, one supraumbilical one umbilical. Offered consideration of hernia repair. She wasn't severely symptomatic at that time. She wish to hold off until her financial situation was in a better place.  She notes now however the hernias persist. It ultimately is reducible but is slightly more sensitive than when I saw her years ago. She recalls having a laparoscopic cholecystectomy and tubal ligation. No other abdominal surgeries. She is rate abstinent from smoking for over 4 years. Still living with her disabled and retired has been. No other new events. She moves her bowels on a daily basis. Can walk at least a half hour if not longer without difficulty. No exertional chest pain or shortness of breath. No history of skin infections or urinary tract infections.   Past Surgical History Timmothy Euler, [place]; 11/30/2016 3:00 PM) Cataract Surgery  Bilateral. Colon Polyp Removal - Colonoscopy  Gallbladder Surgery - Laparoscopic  Shoulder Surgery  Left. Tonsillectomy   Diagnostic Studies History  Timmothy Euler, [place]; 11/30/2016 3:00 PM) Colonoscopy  5-10 years ago Mammogram  within last year Pap Smear  1-5 years ago  Allergies Timmothy Euler, [place]; 11/30/2016 3:01 PM) No Known Drug Allergies 11/30/2016 Allergies Reconciled   Medication History Timmothy Euler, CMA; 11/30/2016 3:04 PM) Aspirin (81MG  Tablet, Oral daily) Active. Wellbutrin SR (150MG  Tablet ER, Oral daily) Active. Lipitor (40MG  Tablet, Oral daily) Active. Vitamin D (Cholecalciferol) (1000UNIT Capsule, Oral daily) Active. Flexeril (10MG  Tablet, Oral as needed) Active. PROzac (40MG  Capsule, Oral daily) Active. Ativan (0.5MG  Tablet, Oral as needed) Active. Protonix (40MG  Tablet DR, Oral daily) Active. Phentermine HCl (37.5MG  Capsule, Oral daily) Active. Medications Reconciled  Social History Timmothy Euler, [place]; 11/30/2016 3:00 PM) Alcohol use  Occasional alcohol use. Caffeine use  Carbonated beverages, Coffee. No drug use  Tobacco use  Former smoker.  Family History Timmothy Euler, [place]; 11/30/2016 3:00 PM) Alcohol Abuse  Brother, Daughter. Arthritis  Father, Mother, Sister. Cervical Cancer  Sister. Depression  Father, Mother, Sister. Diabetes Mellitus  Mother.  Pregnancy / Birth History Timmothy Euler, [place]; 11/30/2016 3:00 PM) Age at menarche  13 years. Age of menopause  64-50 Gravida  2 Maternal age  20-20 Para  2  Other Problems Timmothy Euler, [place]; 11/30/2016 3:00 PM) Anxiety Disorder  Arthritis  Back Pain  Cholelithiasis  Depression  Gastroesophageal Reflux Disease  Hemorrhoids  Hypercholesterolemia  Melanoma  Umbilical Hernia Repair     Review of Systems Timmothy Euler CMA; 11/30/2016 3:00 PM) General Present- Weight Gain. Not Present- Appetite Loss, Chills, Fatigue, Fever, Night Sweats and Weight Loss. Skin Present- Dryness. Not Present- Change in Wart/Mole, Hives, Jaundice, New Lesions, Non-Healing Wounds, Rash and Ulcer. HEENT Present- Ringing in the  Ears.  Not Present- Earache, Hearing Loss, Hoarseness, Nose Bleed, Oral Ulcers, Seasonal Allergies, Sinus Pain, Sore Throat, Visual Disturbances, Wears glasses/contact lenses and Yellow Eyes. Respiratory Present- Snoring. Not Present- Bloody sputum, Chronic Cough, Difficulty Breathing and Wheezing. Cardiovascular Not Present- Chest Pain, Difficulty Breathing Lying Down, Leg Cramps, Palpitations, Rapid Heart Rate, Shortness of Breath and Swelling of Extremities. Gastrointestinal Present- Change in Bowel Habits and Constipation. Not Present- Abdominal Pain, Bloating, Bloody Stool, Chronic diarrhea, Difficulty Swallowing, Excessive gas, Gets full quickly at meals, Hemorrhoids, Indigestion, Nausea, Rectal Pain and Vomiting. Female Genitourinary Present- Urgency. Not Present- Frequency, Nocturia, Painful Urination and Pelvic Pain. Musculoskeletal Present- Back Pain, Joint Pain, Joint Stiffness, Muscle Pain and Muscle Weakness. Not Present- Swelling of Extremities. Neurological Present- Decreased Memory. Not Present- Fainting, Headaches, Numbness, Seizures, Tingling, Tremor, Trouble walking and Weakness. Psychiatric Present- Anxiety and Depression. Not Present- Bipolar, Change in Sleep Pattern, Fearful and Frequent crying. Endocrine Not Present- Cold Intolerance, Excessive Hunger, Hair Changes, Heat Intolerance, Hot flashes and New Diabetes. Hematology Not Present- Blood Thinners, Easy Bruising, Excessive bleeding, Gland problems, HIV and Persistent Infections.  Vitals Barron Alvine Bradford CMA; 11/30/2016 3:05 PM) 11/30/2016 3:04 PM Weight: 215.8 lb Height: 65in Body Surface Area: 2.04 m Body Mass Index: 35.91 kg/m  Temp.: 98.94F  Pulse: 82 (Regular)  BP: 122/68 (Sitting, Left Arm, Standard)       Physical Exam Ardeth Sportsman MD; 11/30/2016 3:29 PM) General Mental Status-Alert. General Appearance-Not in acute distress, Not Sickly. Orientation-Oriented X3. Hydration-Well  hydrated. Voice-Normal.  Integumentary Global Assessment Upon inspection and palpation of skin surfaces of the - Axillae: non-tender, no inflammation or ulceration, no drainage. and Distribution of scalp and body hair is normal. General Characteristics Temperature - normal warmth is noted.  Head and Neck Head-normocephalic, atraumatic with no lesions or palpable masses. Face Global Assessment - atraumatic, no absence of expression. Neck Global Assessment - no abnormal movements, no bruit auscultated on the right, no bruit auscultated on the left, no decreased range of motion, non-tender. Trachea-midline. Thyroid Gland Characteristics - non-tender.  Eye Eyeball - Left-Extraocular movements intact, No Nystagmus. Eyeball - Right-Extraocular movements intact, No Nystagmus. Cornea - Left-No Hazy. Cornea - Right-No Hazy. Sclera/Conjunctiva - Left-No scleral icterus, No Discharge. Sclera/Conjunctiva - Right-No scleral icterus, No Discharge. Pupil - Left-Direct reaction to light normal. Pupil - Right-Direct reaction to light normal.  ENMT Ears Pinna - Left - no drainage observed, no generalized tenderness observed. Right - no drainage observed, no generalized tenderness observed. Nose and Sinuses External Inspection of the Nose - no destructive lesion observed. Inspection of the nares - Left - quiet respiration. Right - quiet respiration. Mouth and Throat Lips - Upper Lip - no fissures observed, no pallor noted. Lower Lip - no fissures observed, no pallor noted. Nasopharynx - no discharge present. Oral Cavity/Oropharynx - Tongue - no dryness observed. Oral Mucosa - no cyanosis observed. Hypopharynx - no evidence of airway distress observed.  Chest and Lung Exam Inspection Movements - Normal and Symmetrical. Accessory muscles - No use of accessory muscles in breathing. Palpation Palpation of the chest reveals - Non-tender. Auscultation Breath sounds - Normal  and Clear.  Cardiovascular Auscultation Rhythm - Regular. Murmurs & Other Heart Sounds - Auscultation of the heart reveals - No Murmurs and No Systolic Clicks.  Abdomen Inspection Inspection of the abdomen reveals - No Visible peristalsis and No Abnormal pulsations. Umbilicus - No Bleeding, No Urine drainage. Palpation/Percussion Palpation and Percussion of the abdomen reveal - Soft, Non Tender, No Rebound tenderness, No Rigidity (  guarding) and No Cutaneous hyperesthesia. Note: Supraumbilical mass about 3 cm reducible consistent with supraumbilical hernia. Small but definite impulse at bellybutton around 1 cm.  Abdomen soft. Nontender, nondistended. No guarding. No diastasis. No other hernias   Female Genitourinary Sexual Maturity Tanner 5 - Adult hair pattern. Note: No vaginal bleeding nor discharge   Peripheral Vascular Upper Extremity Inspection - Left - No Cyanotic nailbeds, Not Ischemic. Right - No Cyanotic nailbeds, Not Ischemic.  Neurologic Neurologic evaluation reveals -normal attention span and ability to concentrate, able to name objects and repeat phrases. Appropriate fund of knowledge , normal sensation and normal coordination. Mental Status Affect - not angry, not paranoid. Cranial Nerves-Normal Bilaterally. Gait-Normal.  Neuropsychiatric Mental status exam performed with findings of-able to articulate well with normal speech/language, rate, volume and coherence, thought content normal with ability to perform basic computations and apply abstract reasoning and no evidence of hallucinations, delusions, obsessions or homicidal/suicidal ideation.  Musculoskeletal Global Assessment Spine, Ribs and Pelvis - no instability, subluxation or laxity. Right Upper Extremity - no instability, subluxation or laxity.  Lymphatic Head & Neck  General Head & Neck Lymphatics: Bilateral - Description - No Localized lymphadenopathy. Axillary  General Axillary Region:  Bilateral - Description - No Localized lymphadenopathy. Femoral & Inguinal  Generalized Femoral & Inguinal Lymphatics: Left - Description - No Localized lymphadenopathy. Right - Description - No Localized lymphadenopathy.    Assessment & Plan  EPIGASTRIC HERNIA (K43.9) Impression: Epigastric hernia reducible but sensitive.  She is ready to consider surgical repair.  Given her obesity and relatively young age, would recommend mesh underlay reinforcement repair. I showed her a sample of what is typically put in. She wanted to make sure it was MRI compatible which I reassured her was. She is interested in proceeding  UMBILICAL HERNIA WITHOUT OBSTRUCTION AND WITHOUT GANGRENE (K42.9) Impression: Small hernia through umbilical stalk nearby. Stable. Would repair at the same time of surgery.   PREOP - [facility] - ENCOUNTER FOR PREOPERATIVE EXAMINATION FOR GENERAL SURGICAL PROCEDURE (Z01.818) Current Plans You are being scheduled for surgery- Our schedulers will call you.  You should hear from our office's scheduling department within 5 working days about the location, date, and time of surgery. We try to make accommodations for patient's preferences in scheduling surgery, but sometimes the OR schedule or the surgeon's schedule prevents [place] from making those accommodations.  If you have not heard from our office [phone]) in 5 working days, call the office and ask for your surgeon's nurse.  If you have other questions about your diagnosis, plan, or surgery, call the office and ask for your surgeon's nurse.  Written instructions provided The anatomy & physiology of the abdominal wall was discussed. The pathophysiology of hernias was discussed. Natural history risks without surgery including progeressive enlargement, pain, incarceration, & strangulation was discussed. Contributors to complications such as smoking, obesity, diabetes, prior surgery, etc were discussed.  I feel the risks of no  intervention will lead to serious problems that outweigh the operative risks; therefore, I recommended surgery to reduce and repair the hernia. I explained laparoscopic techniques with possible need for an open approach. I noted the probable use of mesh to patch and/or buttress the hernia repair  Risks such as bleeding, infection, abscess, need for further treatment, heart attack, death, and other risks were discussed. I noted a good likelihood this will help address the problem. Goals of post-operative recovery were discussed as well. Possibility that this will not correct all symptoms was explained. I stressed  the importance of low-impact activity, aggressive pain control, avoiding constipation, & not pushing through pain to minimize risk of post-operative chronic pain or injury. Possibility of reherniation especially with smoking, obesity, diabetes, immunosuppression, and other health conditions was discussed. We will work to minimize complications.  An educational handout further explaining the pathology & treatment options was given as well. Questions were answered. The patient expresses understanding & wishes to proceed with surgery.  Pt Education - [facility] Hernia Post-Op HCI (Laxmi Choung): discussed with patient and provided information. Pt Education - [facility] Pain Control (Kristiana Jacko) Pt Education - Pamphlet Given - Laparoscopic Hernia Repair: discussed with patient and provided information.  Ardeth Sportsman, M.D., F.A.C.S. Gastrointestinal and Minimally Invasive Surgery [facility] [address], [address] [place], [place] [address] [phone] Main / Paging

## 2016-12-31 NOTE — Anesthesia Preprocedure Evaluation (Signed)
Anesthesia Evaluation  Patient identified by MRN, date of birth, ID band Patient awake    Reviewed: Allergy & Precautions, NPO status , Patient's Chart, lab work & pertinent test results  Airway Mallampati: II  TM Distance: >3 FB Neck ROM: Full    Dental  (+) Poor Dentition, Dental Advisory Given, Missing   Pulmonary neg pulmonary ROS, former smoker,    Pulmonary exam normal        Cardiovascular negative cardio ROS Normal cardiovascular exam     Neuro/Psych PSYCHIATRIC DISORDERS Anxiety Depression negative neurological ROS  negative psych ROS   GI/Hepatic Neg liver ROS, hiatal hernia, GERD  ,  Endo/Other  negative endocrine ROSMorbid obesity  Renal/GU negative Renal ROS  negative genitourinary   Musculoskeletal negative musculoskeletal ROS (+)   Abdominal   Peds negative pediatric ROS (+)  Hematology negative hematology ROS (+)   Anesthesia Other Findings   Reproductive/Obstetrics negative OB ROS                             Anesthesia Physical Anesthesia Plan  ASA: III  Anesthesia Plan: General   Post-op Pain Management:    Induction: Intravenous  Airway Management Planned: Oral ETT  Additional Equipment:   Intra-op Plan:   Post-operative Plan: Extubation in OR  Informed Consent: I have reviewed the patients History and Physical, chart, labs and discussed the procedure including the risks, benefits and alternatives for the proposed anesthesia with the patient or authorized representative who has indicated his/her understanding and acceptance.   Dental advisory given  Plan Discussed with: CRNA, Anesthesiologist and Surgeon  Anesthesia Plan Comments:         Anesthesia Quick Evaluation

## 2016-12-31 NOTE — Transfer of Care (Signed)
Immediate Anesthesia Transfer of Care Note  Patient: Lori Cooper  Procedure(s) Performed: Procedure(s): [place] (N/A)  Patient Location: PACU  Anesthesia Type:General  Level of Consciousness: awake, alert , oriented and patient cooperative  Airway & Oxygen Therapy: Patient Spontanous Breathing and Patient connected to nasal cannula oxygen  Post-op Assessment: Report given to RN and Post -op Vital signs reviewed and stable  Post vital signs: Reviewed and stable  Last Vitals:  Vitals:   12/31/16 0938  BP: 140/66  Pulse: 79  Resp: 18  Temp: 36.7 C    Last Pain:  Vitals:   12/31/16 0938  TempSrc: Oral      Patients Stated Pain Goal: 7 (29/92/42 6834)  Complications: No apparent anesthesia complications

## 2016-12-31 NOTE — Interval H&P Note (Signed)
History and Physical Interval Note:  12/31/2016 10:28 AM  Lori Cooper  has presented today for surgery, with the diagnosis of PERIUMBILICAL NEW ABDOMINAL WALL HERNIAS  The various methods of treatment have been discussed with the patient and family. After consideration of risks, benefits and other options for treatment, the patient has consented to  Procedure(s): [place] (N/A) as a surgical intervention .  The patient's history has been reviewed, patient examined, no change in status, stable for surgery.  I have reviewed the patient's chart and labs.  Questions were answered to the patient's satisfaction.     Lori Cooper C.

## 2016-12-31 NOTE — Op Note (Signed)
12/31/2016  PATIENT:  Lori Cooper  61 y.o. female  Patient Care Team: Sharion Balloon, FNP as PCP - General (Nurse Practitioner) Ladene Artist, MD as Consulting Physician (Gastroenterology) Frederik Pear, MD as Consulting Physician (Orthopedic Surgery) Jolaine Artist, MD as Consulting Physician (Cardiology) Michael Boston, MD as Consulting Physician (General Surgery)  PRE-OPERATIVE DIAGNOSIS:  PERIUMBILICAL NEW ABDOMINAL WALL HERNIAS  POST-OPERATIVE DIAGNOSIS:  INCISIONAL ABDOMINAL WALL HERNIAS X 3, incarcerated  PROCEDURE:    LAPAROSCOPIC REPAIR OF  Incisional New abdominal wall hernias x 3  HERNIA WITH MESH  SURGEON:  Adin Hector, MD  ASSISTANT: Nurse   ANESTHESIA:     General  Local anesthesia field block: (0.25% bupivacaine & liposomal  Bupivacaine [Experel])  EBL:  Total I/O In: 850 [I.V.:850] Out: -   Per anesthesia record  Delay start of Pharmacological VTE agent (>24hrs) due to surgical blood loss or risk of bleeding:  no  DRAINS: none   SPECIMEN:  No Specimen  DISPOSITION OF SPECIMEN:  N/A  COUNTS:  YES  PLAN OF CARE: Discharge to home after PACU  PATIENT DISPOSITION:  PACU - hemodynamically stable.  INDICATION: Pleasant patient has developed a ventral wall abdominal hernia.   Recommendation was made for surgical repair:  The anatomy & physiology of the abdominal wall was discussed. The pathophysiology of hernias was discussed. Natural history risks without surgery including progeressive enlargement, pain, incarceration & strangulation was discussed. Contributors to complications such as smoking, obesity, diabetes, prior surgery, etc were discussed.  I feel the risks of no intervention will lead to serious problems that outweigh the operative risks; therefore, I recommended surgery to reduce and repair the hernia. I explained laparoscopic techniques with possible need for an open approach. I noted the probable use of mesh to patch and/or buttress the  hernia repair  Risks such as bleeding, infection, abscess, need for further treatment, heart attack, death, and other risks were discussed. I noted a good likelihood this will help address the problem. Goals of post-operative recovery were discussed as well. Possibility that this will not correct all symptoms was explained. I stressed the importance of low-impact activity, aggressive pain control, avoiding constipation, & not pushing through pain to minimize risk of post-operative chronic pain or injury. Possibility of reherniation especially with smoking, obesity, diabetes, immunosuppression, and other health conditions was discussed. We will work to minimize complications.  An educational handout further explaining the pathology & treatment options was given as well. Questions were answered. The patient expresses understanding & wishes to proceed with surgery.   OR FINDINGS: 6 x 4 cm periumbilical region of swiss cheese hernias, incarcerated with preperitoneal fat and falciform ligament.  Type of repair: Laparoscopic underlay repair   Placement of mesh: Intraperitoneal underlay repair with partial rectorectus placement  Name of mesh: Bard Ventralight dual sided (polypropylene / Seprafilm)  Size of mesh: 25x20cm  Orientation: Vertical  Mesh overlap:  5-7cm   DESCRIPTION:   Informed consent was confirmed. The patient underwent general anaesthesia without difficulty. The patient was positioned appropriately. VTE prevention in place. The patient's abdomen was clipped, prepped, & draped in a sterile fashion. Surgical timeout confirmed our plan.  The patient was positioned in reverse Trendelenburg. Abdominal entry was gained using optical entry technique in the left upper abdomen. Entry was clean. I induced carbon dioxide insufflation. Camera inspection revealed no injury. Extra ports were carefully placed under direct laparoscopic visualization.   I could see hernias on the prietal peritoneum  under the abdominal wall.  I did laparoscopic lysis of adhesions to free off the falciform ligament from the xiphoid to the suprapubic region, reducing the falciform ligament and preperitoneal fat out of the incisional hernias.  This help expose the Swiss cheese region of hernias, about a 6 x 4 cm region.  I saw no evidence of any inguinal hernias or hernias elsewhere.  I primarily used focused sharp dissection.    I made sure hemostasis was good.  I mapped out the region using a needle passer.   To ensure that I would have at least 5 cm radial coverage outside of the hernia defect, I chose a 25x20cm dual sided mesh.  I placed #1 Prolene stitches around its edge about every 5 cm = 14 total.  I rolled the mesh & placed into the peritoneal cavity through the largest hernia defect.  I unrolled the mesh and positioned it appropriately.  I secured the mesh to cover up the hernia defect using a laparoscopic suture passer to pass the tails of the Prolene through the abdominal wall & tagged them with clamps for good transfascial suturing.  I started out in four corners to make sure I had the mesh centered under the hernia defect appropriately, and then proceeded to work in quadrants.    We evacuated CO2 & desufflated the abdomen.  I tied the fascial stitches down. I closed the fascial defect that I placed the mesh through using #1 PDS interrupted transverse stitches primarily.  I reinsufflated the abdomen. The mesh provided at least circumferential coverage around the entire region of hernia defects.  I secured the mesh centrally with an additional trans fascial stitch in & out the mesh using #1 PDS under laparoscopic visualization.   I tacked the edges & central part of the mesh to the peritoneum/posterior rectus fascia with SecureStrap absorbable tacks.   I did reinspection. Hemostasis was good. Mesh laid well. I completed a broad field block of local anesthesia at fascial stitch sites & fascial closure areas.     Capnoperitoneum was evacuated. Ports were removed. The skin was closed with Monocryl at the port sites and Steri-Strips on the fascial stitch puncture sites.  Patient is being extubated to go to the recovery room.  I discussed operative findings, updated the patient's status, discussed probable steps to recovery, and gave postoperative recommendations to the patient's spouse.  Recommendations were made.  Questions were answered.  He expressed understanding & appreciation.  Adin Hector, M.D., F.A.C.S. Gastrointestinal and Minimally Invasive Surgery [facility] [address], [place], [place] [address] [phone] Main / Paging  12/31/2016 12:59 PM

## 2016-12-31 NOTE — Anesthesia Postprocedure Evaluation (Addendum)
Anesthesia Post Note  Patient: Lori Cooper  Procedure(s) Performed: Procedure(s) (LRB): LAPAROSCOPIC VENTRAL WALL HERNIA REPAIR WITH ERAS PATHWAY (N/A)  Patient location during evaluation: PACU Anesthesia Type: General Level of consciousness: sedated Pain management: pain level controlled Vital Signs Assessment: post-procedure vital signs reviewed and stable Respiratory status: spontaneous breathing and respiratory function stable Cardiovascular status: stable Anesthetic complications: no       Last Vitals:  Vitals:   12/31/16 1415 12/31/16 1430  BP: 121/79 124/63  Pulse: 82 80  Resp: 14 20  Temp:      Last Pain:  Vitals:   12/31/16 1530  TempSrc:   PainSc: 3                  Taryn Nave DANIEL

## 2016-12-31 NOTE — Anesthesia Procedure Notes (Addendum)
Procedure Name: Intubation Date/Time: 12/31/2016 10:43 AM Performed by: Bethena Roys T Pre-anesthesia Checklist: Patient identified, Emergency Drugs available, Suction available and Patient being monitored Patient Re-evaluated:Patient Re-evaluated prior to inductionOxygen Delivery Method: Circle system utilized Preoxygenation: Pre-oxygenation with 100% oxygen Intubation Type: IV induction Ventilation: Mask ventilation without difficulty Laryngoscope Size: Mac and 3 Grade View: Grade I Tube type: Oral Tube size: 7.0 mm Number of attempts: 1 Airway Equipment and Method: Stylet and Oral airway Placement Confirmation: ETT inserted through vocal cords under direct vision,  positive ETCO2 and breath sounds checked- equal and bilateral Secured at: 21 cm Tube secured with: Tape Dental Injury: Teeth and Oropharynx as per pre-operative assessment

## 2016-12-31 NOTE — Discharge Instructions (Signed)
HERNIA REPAIR: POST OP INSTRUCTIONS ° °###################################################################### ° °EAT °Gradually transition to a high fiber diet with a fiber supplement over the next few weeks after discharge.  Start with a pureed / full liquid diet (see below) ° °WALK °Walk an hour a day.  Control your pain to do that.   ° °CONTROL PAIN °Control pain so that you can walk, sleep, tolerate sneezing/coughing, go up/down stairs. ° °HAVE A BOWEL MOVEMENT DAILY °Keep your bowels regular to avoid problems.  OK to try a laxative to override constipation.  OK to use an antidairrheal to slow down diarrhea.  Call if not better after 2 tries ° °CALL IF YOU HAVE PROBLEMS/CONCERNS °Call if you are still struggling despite following these instructions. °Call if you have concerns not answered by these instructions ° °###################################################################### ° ° ° °1. DIET: Follow a light bland diet the first 24 hours after arrival home, such as soup, liquids, crackers, etc.  Be sure to include lots of fluids daily.  Avoid fast food or heavy meals as your are more likely to get nauseated.  Eat a low fat the next few days after surgery. °2. Take your usually prescribed home medications unless otherwise directed. °3. PAIN CONTROL: °a. Pain is best controlled by a usual combination of three different methods TOGETHER: °i. Ice/Heat °ii. Over the counter pain medication °iii. Prescription pain medication °b. Most patients will experience some swelling and bruising around the hernia(s) such as the bellybutton, groins, or old incisions.  Ice packs or heating pads (30-60 minutes up to 6 times a day) will help. Use ice for the first few days to help decrease swelling and bruising, then switch to heat to help relax tight/sore spots and speed recovery.  Some people prefer to use ice alone, heat alone, alternating between ice & heat.  Experiment to what works for you.  Swelling and bruising can take  several weeks to resolve.   °c. It is helpful to take an over-the-counter pain medication regularly for the first few weeks.  Choose one of the following that works best for you: °i. Naproxen (Aleve, etc)  Two 220mg tabs twice a day °ii. Ibuprofen (Advil, etc) Three 200mg tabs four times a day (every meal & bedtime) °iii. Acetaminophen (Tylenol, etc) 325-650mg four times a day (every meal & bedtime) °d. A  prescription for pain medication should be given to you upon discharge.  Take your pain medication as prescribed.  °i. If you are having problems/concerns with the prescription medicine (does not control pain, nausea, vomiting, rash, itching, etc), please call us [phone] to see if we need to switch you to a different pain medicine that will work better for you and/or control your side effect better. °ii. If you need a refill on your pain medication, please contact your pharmacy.  They will contact our office to request authorization. Prescriptions will not be filled after 5 pm or on week-ends. °4. Avoid getting constipated.  Between the surgery and the pain medications, it is common to experience some constipation.  Increasing fluid intake and taking a fiber supplement (such as Metamucil, Citrucel, FiberCon, MiraLax, etc) 1-2 times a day regularly will usually help prevent this problem from occurring.  A mild laxative (prune juice, Milk of Magnesia, MiraLax, etc) should be taken according to package directions if there are no bowel movements after 48 hours.   °5. Wash / shower every day.  You may shower over the dressings as they are waterproof.   °6. Remove   your waterproof bandages 5 days after surgery.  You may leave the incision open to air.  You may replace a dressing/Band-Aid to cover the incision for comfort if you wish.  Continue to shower over incision(s) after the dressing is off.    7. ACTIVITIES as tolerated:   a. You may resume regular (light) daily activities beginning the next day--such  as daily self-care, walking, climbing stairs--gradually increasing activities as tolerated.  If you can walk 30 minutes without difficulty, it is safe to try more intense activity such as jogging, treadmill, bicycling, low-impact aerobics, swimming, etc. b. Save the most intensive and strenuous activity for last such as sit-ups, heavy lifting, contact sports, etc  Refrain from any heavy lifting or straining until you are off narcotics for pain control.   c. DO NOT PUSH THROUGH PAIN.  Let pain be your guide: If it hurts to do something, don't do it.  Pain is your body warning you to avoid that activity for another week until the pain goes down. d. You may drive when you are no longer taking prescription pain medication, you can comfortably wear a seatbelt, and you can safely maneuver your car and apply brakes. e. Dennis Bast may have sexual intercourse when it is comfortable.  8. FOLLOW UP in our office a. Please call [facility] at [phone] to set up an appointment to see your surgeon in the office for a follow-up appointment approximately 2-3 weeks after your surgery. b. Make sure that you call for this appointment the day you arrive home to insure a convenient appointment time. 9.  IF YOU HAVE DISABILITY OR FAMILY LEAVE FORMS, BRING THEM TO THE OFFICE FOR PROCESSING.  DO NOT GIVE THEM TO YOUR DOCTOR.  WHEN TO CALL us [phone]: 1. Poor pain control 2. Reactions / problems with new medications (rash/itching, nausea, etc)  3. Fever over 101.5 F (38.5 C) 4. Inability to urinate 5. Nausea and/or vomiting 6. Worsening swelling or bruising 7. Continued bleeding from incision. 8. Increased pain, redness, or drainage from the incision   The clinic staff is available to answer your questions during regular business hours (8:30am-5pm).  Please dont hesitate to call and ask to speak to one of our nurses for clinical concerns.   If you have a medical emergency, go to the nearest emergency room or call  911.  A surgeon from [facility] is always on call at the hospitals in [facility], [place], [place], [place], [place]  [address] ?  [address] [address], [place], [place]   [address] MAIN: [phone] ? TOLL FREE: [phone] ? FAX: [phone] [email or web address]   Information for Discharge Teaching: EXPAREL (bupivacaine liposome injectable suspension)   Your surgeon gave you EXPAREL(bupivacaine) in your surgical incision to help control your pain after surgery.   EXPAREL is a local anesthetic that provides pain relief by numbing the tissue around the surgical site.  EXPAREL is designed to release pain medication over time and can control pain for up to 72 hours.  Depending on how you respond to EXPAREL, you may require less pain medication during your recovery.  Possible side effects:  Temporary loss of sensation or ability to move in the area where bupivacaine was injected.  Nausea, vomiting, constipation  Rarely, numbness and tingling in your mouth or lips, lightheadedness, or anxiety may occur.  Call your doctor right away if you think you may be experiencing any of these sensations, or if you  have other questions regarding possible side effects.  Follow all other discharge instructions given to you by your surgeon or nurse. Eat a healthy diet and drink plenty of water or other fluids.  If you return to the hospital for any reason within 96 hours following the administration of EXPAREL, please inform your health care providers.   Post Anesthesia Home Care Instructions  Activity: Get plenty of rest for the remainder of the day. A responsible adult should stay with you for 24 hours following the procedure.  For the next 24 hours, DO NOT: -Drive a car -Paediatric nurse -Drink alcoholic beverages -Take any medication unless instructed by your physician -Make any legal decisions or sign important  papers.  Meals: Start with liquid foods such as gelatin or soup. Progress to regular foods as tolerated. Avoid greasy, spicy, heavy foods. If nausea and/or vomiting occur, drink only clear liquids until the nausea and/or vomiting subsides. Call your physician if vomiting continues.  Special Instructions/Symptoms: Your throat may feel dry or sore from the anesthesia or the breathing tube placed in your throat during surgery. If this causes discomfort, gargle with warm salt water. The discomfort should disappear within 24 hours.  If you had a scopolamine patch placed behind your ear for the management of post- operative nausea and/or vomiting:  1. The medication in the patch is effective for 72 hours, after which it should be removed.  Wrap patch in a tissue and discard in the trash. Wash hands thoroughly with soap and water. 2. You may remove the patch earlier than 72 hours if you experience unpleasant side effects which may include dry mouth, dizziness or visual disturbances. 3. Avoid touching the patch. Wash your hands with soap and water after contact with the patch.

## 2017-01-01 ENCOUNTER — Encounter (HOSPITAL_BASED_OUTPATIENT_CLINIC_OR_DEPARTMENT_OTHER): Payer: Self-pay | Admitting: Surgery

## 2017-01-12 ENCOUNTER — Other Ambulatory Visit: Payer: Self-pay | Admitting: Family Medicine

## 2017-01-12 ENCOUNTER — Other Ambulatory Visit: Payer: Self-pay | Admitting: Family

## 2017-01-13 NOTE — Telephone Encounter (Signed)
Last seen Christy in [facility] 2017. Dettinger coverage If approved route to nurse for phone in

## 2017-01-13 NOTE — Telephone Encounter (Signed)
Forward this to Tamora, this can wait until she is back tomorrow

## 2017-01-14 NOTE — Telephone Encounter (Signed)
Phoned in.

## 2017-02-17 ENCOUNTER — Other Ambulatory Visit: Payer: Self-pay | Admitting: Family

## 2017-02-19 ENCOUNTER — Ambulatory Visit (INDEPENDENT_AMBULATORY_CARE_PROVIDER_SITE_OTHER): Payer: PPO | Admitting: Family

## 2017-02-19 ENCOUNTER — Encounter: Payer: Self-pay | Admitting: Family

## 2017-02-19 VITALS — BP 127/74 | HR 75 | Temp 97.4°F | Ht 65.0 in | Wt 228.8 lb

## 2017-02-19 DIAGNOSIS — E8881 Metabolic syndrome: Secondary | ICD-10-CM

## 2017-02-19 DIAGNOSIS — E559 Vitamin D deficiency, unspecified: Secondary | ICD-10-CM | POA: Diagnosis not present

## 2017-02-19 DIAGNOSIS — G2581 Restless legs syndrome: Secondary | ICD-10-CM | POA: Diagnosis not present

## 2017-02-19 DIAGNOSIS — F329 Major depressive disorder, single episode, unspecified: Secondary | ICD-10-CM | POA: Diagnosis not present

## 2017-02-19 DIAGNOSIS — M1990 Unspecified osteoarthritis, unspecified site: Secondary | ICD-10-CM | POA: Diagnosis not present

## 2017-02-19 DIAGNOSIS — IMO0001 Reserved for inherently not codable concepts without codable children: Secondary | ICD-10-CM

## 2017-02-19 DIAGNOSIS — K2100 Gastro-esophageal reflux disease with esophagitis, without bleeding: Secondary | ICD-10-CM

## 2017-02-19 DIAGNOSIS — E782 Mixed hyperlipidemia: Secondary | ICD-10-CM | POA: Diagnosis not present

## 2017-02-19 DIAGNOSIS — F411 Generalized anxiety disorder: Secondary | ICD-10-CM | POA: Diagnosis not present

## 2017-02-19 DIAGNOSIS — E669 Obesity, unspecified: Secondary | ICD-10-CM | POA: Diagnosis not present

## 2017-02-19 DIAGNOSIS — F32A Depression, unspecified: Secondary | ICD-10-CM

## 2017-02-19 DIAGNOSIS — Z713 Dietary counseling and surveillance: Secondary | ICD-10-CM

## 2017-02-19 DIAGNOSIS — Z6838 Body mass index (BMI) 38.0-38.9, adult: Secondary | ICD-10-CM

## 2017-02-19 LAB — LIPID PANEL
Chol/HDL Ratio: 3.8 ratio (ref 0.0–4.4)
Cholesterol, Total: 211 mg/dL — ABNORMAL HIGH (ref 100–199)
HDL: 56 mg/dL (ref >39)
LDL Calculated: 122 mg/dL — ABNORMAL HIGH (ref 0–99)
Triglycerides: 167 mg/dL — ABNORMAL HIGH (ref 0–149)
VLDL Cholesterol Cal: 33 mg/dL (ref 5–40)

## 2017-02-19 LAB — CMP14+EGFR
ALT: 32 IU/L (ref 0–32)
AST: 25 IU/L (ref 0–40)
Albumin/Globulin Ratio: 1.9 (ref 1.2–2.2)
Albumin: 4.1 g/dL (ref 3.6–4.8)
Alkaline Phosphatase: 91 IU/L (ref 39–117)
BUN/Creatinine Ratio: 17 (ref 12–28)
BUN: 12 mg/dL (ref 8–27)
Bilirubin Total: 0.5 mg/dL (ref 0.0–1.2)
CO2: 26 mmol/L (ref 18–29)
Calcium: 9.1 mg/dL (ref 8.7–10.3)
Chloride: 99 mmol/L (ref 96–106)
Creatinine, Ser: 0.69 mg/dL (ref 0.57–1.00)
GFR calc Af Amer: 108 mL/min/{1.73_m2} (ref >59)
GFR calc non Af Amer: 94 mL/min/{1.73_m2} (ref >59)
Globulin, Total: 2.2 g/dL (ref 1.5–4.5)
Glucose: 105 mg/dL — ABNORMAL HIGH (ref 65–99)
Potassium: 4.5 mmol/L (ref 3.5–5.2)
Sodium: 140 mmol/L (ref 134–144)
Total Protein: 6.3 g/dL (ref 6.0–8.5)

## 2017-02-19 MED ORDER — DEXLANSOPRAZOLE 60 MG PO CPDR: 60.0000 mg | DELAYED_RELEASE_CAPSULE | Freq: Every day | ORAL | 1 refills | Status: DC

## 2017-02-19 MED ORDER — PHENTERMINE HCL 37.5 MG PO CAPS: 37.5000 mg | ORAL_CAPSULE | ORAL | 2 refills | Status: DC

## 2017-02-19 MED ORDER — MELOXICAM 15 MG PO TABS: 15.0000 mg | ORAL_TABLET | Freq: Every day | ORAL | 0 refills | Status: DC

## 2017-02-19 MED ORDER — LORAZEPAM 0.5 MG PO TABS: 0.5000 mg | ORAL_TABLET | Freq: Three times a day (TID) | ORAL | 4 refills | Status: DC | PRN

## 2017-02-19 MED ORDER — ROPINIROLE HCL 1 MG PO TABS: ORAL_TABLET | ORAL | 1 refills | Status: DC

## 2017-02-19 MED ORDER — ALBUTEROL SULFATE HFA 108 (90 BASE) MCG/ACT IN AERS: 2.0000 | INHALATION_SPRAY | Freq: Four times a day (QID) | RESPIRATORY_TRACT | 2 refills | Status: DC | PRN

## 2017-02-19 NOTE — Patient Instructions (Addendum)
Restless Legs Syndrome Restless legs syndrome is a condition that causes uncomfortable feelings or sensations in the legs, especially while sitting or lying down. The sensations usually cause an overwhelming urge to move the legs. The arms can also sometimes be affected. The condition can range from mild to severe. The symptoms often interfere with a person's ability to sleep. What are the causes? The cause of this condition is not known. What increases the risk? This condition is more likely to develop in:  People who are older than age 52.  Pregnant women. In general, restless legs syndrome is more common in women than in men.  People who have a family history of the condition.  People who have certain medical conditions, such as iron deficiency, kidney disease, Parkinson disease, or nerve damage.  People who take certain medicines, such as medicines for high blood pressure, nausea, colds, allergies, depression, and some heart conditions. What are the signs or symptoms? The main symptom of this condition is uncomfortable sensations in the legs. These sensations may be:  Described as pulling, tingling, prickling, throbbing, crawling, or burning.  Worse while you are sitting or lying down.  Worse during periods of rest or inactivity.  Worse at night, often interfering with your sleep.  Accompanied by a very strong urge to move your legs.  Temporarily relieved by movement of your legs. The sensations usually affect both sides of the body. The arms can also be affected, but this is rare. People who have this condition often have tiredness during the day because of their lack of sleep at night. How is this diagnosed? This condition may be diagnosed based on your description of the symptoms. You may also have tests, including blood tests, to check for other conditions that may lead to your symptoms. In some cases, you may be asked to spend some time in a sleep lab so your sleeping can  be monitored. How is this treated? Treatment for this condition is focused on managing the symptoms. Treatment may include:  Self-help and lifestyle changes.  Medicines. Follow these instructions at home:  Take medicines only as directed by your health care provider.  Try these methods to get temporary relief from the uncomfortable sensations:  Massage your legs.  Walk or stretch.  Take a cold or hot bath.  Practice good sleep habits. For example, go to bed and get up at the same time every day.  Exercise regularly.  Practice ways of relaxing, such as yoga or meditation.  Avoid caffeine and alcohol.  Do not use any tobacco products, including cigarettes, chewing tobacco, or electronic cigarettes. If you need help quitting, ask your health care provider.  Keep all follow-up visits as directed by your health care provider. This is important. Contact a health care provider if: Your symptoms do not improve with treatment, or they get worse. This information is not intended to replace advice given to you by your health care provider. Make sure you discuss any questions you have with your health care provider. Document Released: 10/23/2002 Document Revised: 04/09/2016 Document Reviewed: 10/29/2014 Elsevier Interactive Patient Education  2017 [employer].

## 2017-02-19 NOTE — Progress Notes (Signed)
Subjective:    Patient ID: Lori Cooper, female    DOB: 1955-11-17, 61 y.o.   MRN: [id number]  Pt presents to the office today for chronic follow up. Pt is requesting a rx of phentermine for weight loss. Pt states she has not taken it in over two months because she has had hernia repair. Pt has gained 20 lbs inc 08/21/16. Pt states she started "dieting" this week.   Hyperlipidemia  This is a chronic problem. The current episode started more than 1 year ago. The problem is uncontrolled. Recent lipid tests were reviewed and are high. Exacerbating diseases include obesity. She has no history of diabetes or hypothyroidism. Factors aggravating her hyperlipidemia include fatty foods. Pertinent negatives include no leg pain, myalgias or shortness of breath. Current antihyperlipidemic treatment includes statins and diet change. The current treatment provides moderate improvement of lipids. Risk factors for coronary artery disease include dyslipidemia, post-menopausal, obesity and family history.  Depression       The patient presents with depression.  This is a chronic problem.  The current episode started more than 1 year ago.   The problem has been resolved since onset.  Associated symptoms include no helplessness, no hopelessness, does not have insomnia, no myalgias, no headaches and not sad.  Past treatments include SSRIs - Selective serotonin reuptake inhibitors.  Compliance with treatment is good.  Past medical history includes anxiety and depression.     Pertinent negatives include no hypothyroidism. Gastroesophageal Reflux  She complains of choking, coughing and heartburn. She reports no belching or no sore throat. This is a chronic problem. The current episode started more than 1 year ago. The problem occurs frequently. The problem has been waxing and waning. The heartburn wakes her from sleep. The heartburn does not limit her activity. The heartburn doesn't change with position. The symptoms are  aggravated by certain foods and lying down. Pertinent negatives include no muscle weakness. She has tried a PPI for the symptoms. The treatment provided mild relief.  Anxiety  Presents for follow-up visit. Onset was 1 to 6 months ago. The problem has been waxing and waning. Symptoms include depressed mood, excessive worry, irritability, nervous/anxious behavior and palpitations. Patient reports no compulsions, insomnia, muscle tension, panic or shortness of breath. Symptoms occur rarely. The quality of sleep is good.   Her past medical history is significant for anxiety/panic attacks and depression. Past treatments include SSRIs and benzodiazephines. The treatment provided significant relief. Compliance with prior treatments has been good.  Arthritis  Presents for follow-up visit. She complains of pain and stiffness. She reports no joint warmth. Affected locations include the right knee, right hip and left knee (back). Her pain is at a severity of 8/10. Pertinent negatives include no dry eyes. Her past medical history is significant for osteoarthritis. Past treatments include rest and corticosteroids. The treatment provided mild relief. Factors aggravating her arthritis include climbing stairs.  RLS Pt states this seems to being doing better. PT states she takes "iron, magnesium, and potassium" and that seem to be helping. Pt states she also take either ativan or flexeril at night that seems to help.  Metabolic Syndrome PT has not been exercising or eating healthy over the last few months. States she will try to do better.    Review of Systems  Constitutional: Positive for irritability.  HENT: Negative.  Negative for sore throat.   Eyes: Negative.   Respiratory: Positive for cough and choking. Negative for shortness of breath.   Cardiovascular: Positive  for palpitations.  Gastrointestinal: Positive for heartburn.  Endocrine: Negative.   Genitourinary: Negative.   Musculoskeletal: Positive for  arthritis and stiffness. Negative for myalgias and muscle weakness.  Neurological: Negative.  Negative for headaches.  Hematological: Negative.   Psychiatric/Behavioral: Positive for depression. The patient is nervous/anxious. The patient does not have insomnia.   All other systems reviewed and are negative.      Objective:   Physical Exam  Constitutional: She is oriented to person, place, and time. She appears well-developed and well-nourished. No distress.  Obese   HENT:  Head: Normocephalic and atraumatic.  Right Ear: External ear normal.  Left Ear: External ear normal.  Nose: Nose normal.  Mouth/Throat: Oropharynx is clear and moist.  Eyes: Pupils are equal, round, and reactive to light.  Neck: Normal range of motion. Neck supple. No thyromegaly present.  Cardiovascular: Normal rate, regular rhythm, normal heart sounds and intact distal pulses.   No murmur heard. Pulmonary/Chest: Effort normal and breath sounds normal. No respiratory distress. She has no wheezes.  Abdominal: Soft. Bowel sounds are normal. She exhibits no distension. There is no tenderness.  Musculoskeletal: Normal range of motion. She exhibits no edema or tenderness.  Neurological: She is alert and oriented to person, place, and time. No cranial nerve deficit.  Skin: Skin is warm and dry.  Psychiatric: She has a normal mood and affect. Her behavior is normal. Judgment and thought content normal.  Vitals reviewed.    BP 127/74   Pulse 75   Temp 97.4 F (36.3 C) (Oral)   Ht 5' 5" (1.651 m)   Wt 228 lb 12.8 oz (103.8 kg)   BMI 38.07 kg/m      Assessment & Plan:  1. Gastroesophageal reflux disease with esophagitis -Protonix stopped and Dexilant 60 mg started --Diet discussed- Avoid fried, spicy, citrus foods, caffeine and alcohol -Do not eat 2-3 hours before bedtime -Encouraged small frequent meals -Avoid NSAID's - dexlansoprazole (DEXILANT) 60 MG capsule; Take 1 capsule (60 mg total) by mouth daily.   Dispense: 90 capsule; Refill: 1 - CMP14+EGFR  2. Arthritis -PT started on Mobic today - CMP14+EGFR - meloxicam (MOBIC) 15 MG tablet; Take 1 tablet (15 mg total) by mouth daily.  Dispense: 30 tablet; Refill: 0  3. Vitamin D deficiency - CMP14+EGFR  4. Restless legs -Pt started on Requip today - CMP14+EGFR - rOPINIRole (REQUIP) 1 MG tablet; 0.5 mg PO at night X 5 days, then can increase by 0.5 mg every week until 3 mg  Dispense: 90 tablet; Refill: 1  5. Class 2 obesity with serious comorbidity and body mass index (BMI) of 38.0 to 38.9 in adult, unspecified obesity type - CMP14+EGFR - phentermine 37.5 MG capsule; Take 1 capsule (37.5 mg total) by mouth every other day.  Dispense: 30 capsule; Refill: 2  6. Mixed hyperlipidemia - CMP14+EGFR - Lipid panel  7. Generalized anxiety disorder - LORazepam (ATIVAN) 0.5 MG tablet; Take 1 tablet (0.5 mg total) by mouth every 8 (eight) hours as needed.  Dispense: 60 tablet; Refill: 4 - CMP14+EGFR  8. Depression, unspecified depression type - MMH68+GSUP  9. Metabolic syndrome - JSR15+XYVO - phentermine 37.5 MG capsule; Take 1 capsule (37.5 mg total) by mouth every other day.  Dispense: 30 capsule; Refill: 2  10. Weight loss counseling, encounter for - CMP14+EGFR - phentermine 37.5 MG capsule; Take 1 capsule (37.5 mg total) by mouth every other day.  Dispense: 30 capsule; Refill: 2   Continue all meds Labs pending Health Maintenance reviewed  Diet and exercise encouraged RTO 1 month recheck RLS, arthritis, and weight loss  Evelina Dun, FNP

## 2017-02-22 ENCOUNTER — Other Ambulatory Visit: Payer: Self-pay | Admitting: Family

## 2017-03-23 ENCOUNTER — Ambulatory Visit (INDEPENDENT_AMBULATORY_CARE_PROVIDER_SITE_OTHER): Payer: PPO | Admitting: Family

## 2017-03-23 ENCOUNTER — Encounter: Payer: Self-pay | Admitting: Family

## 2017-03-23 VITALS — BP 111/61 | HR 82 | Temp 97.7°F | Ht 65.0 in | Wt 222.0 lb

## 2017-03-23 DIAGNOSIS — Z01419 Encounter for gynecological examination (general) (routine) without abnormal findings: Secondary | ICD-10-CM

## 2017-03-23 DIAGNOSIS — G2581 Restless legs syndrome: Secondary | ICD-10-CM | POA: Diagnosis not present

## 2017-03-23 DIAGNOSIS — Z6838 Body mass index (BMI) 38.0-38.9, adult: Secondary | ICD-10-CM | POA: Diagnosis not present

## 2017-03-23 DIAGNOSIS — K2100 Gastro-esophageal reflux disease with esophagitis, without bleeding: Secondary | ICD-10-CM

## 2017-03-23 DIAGNOSIS — Z1211 Encounter for screening for malignant neoplasm of colon: Secondary | ICD-10-CM

## 2017-03-23 DIAGNOSIS — F32A Depression, unspecified: Secondary | ICD-10-CM

## 2017-03-23 DIAGNOSIS — M1990 Unspecified osteoarthritis, unspecified site: Secondary | ICD-10-CM | POA: Diagnosis not present

## 2017-03-23 DIAGNOSIS — F411 Generalized anxiety disorder: Secondary | ICD-10-CM

## 2017-03-23 DIAGNOSIS — F329 Major depressive disorder, single episode, unspecified: Secondary | ICD-10-CM

## 2017-03-23 DIAGNOSIS — IMO0001 Reserved for inherently not codable concepts without codable children: Secondary | ICD-10-CM

## 2017-03-23 DIAGNOSIS — E782 Mixed hyperlipidemia: Secondary | ICD-10-CM

## 2017-03-23 DIAGNOSIS — E669 Obesity, unspecified: Secondary | ICD-10-CM | POA: Diagnosis not present

## 2017-03-23 DIAGNOSIS — Z0000 Encounter for general adult medical examination without abnormal findings: Secondary | ICD-10-CM | POA: Diagnosis not present

## 2017-03-23 LAB — MICROSCOPIC EXAMINATION: Renal Epithel, UA: NONE SEEN /hpf

## 2017-03-23 LAB — URINALYSIS, COMPLETE
Bilirubin, UA: NEGATIVE
Glucose, UA: NEGATIVE
Nitrite, UA: NEGATIVE
Specific Gravity, UA: >1.03 — ABNORMAL HIGH (ref 1.005–1.030)
Urobilinogen, Ur: 0.2 mg/dL (ref 0.2–1.0)
pH, UA: 5.5 (ref 5.0–7.5)

## 2017-03-23 MED ORDER — TRAMADOL HCL 50 MG PO TABS: 100.0000 mg | ORAL_TABLET | Freq: Two times a day (BID) | ORAL | 3 refills | Status: DC | PRN

## 2017-03-23 NOTE — Patient Instructions (Signed)
Health Maintenance, Female Adopting a healthy lifestyle and getting preventive care can go a long way to promote health and wellness. Talk with your health care provider about what schedule of regular examinations is right for you. This is a good chance for you to check in with your provider about disease prevention and staying healthy. In between checkups, there are plenty of things you can do on your own. Experts have done a lot of research about which lifestyle changes and preventive measures are most likely to keep you healthy. Ask your health care provider for more information. Weight and diet Eat a healthy diet  Be sure to include plenty of vegetables, fruits, low-fat dairy products, and lean protein.  Do not eat a lot of foods high in solid fats, added sugars, or salt.  Get regular exercise. This is one of the most important things you can do for your health.  Most adults should exercise for at least 150 minutes each week. The exercise should increase your heart rate and make you sweat (moderate-intensity exercise).  Most adults should also do strengthening exercises at least twice a week. This is in addition to the moderate-intensity exercise. Maintain a healthy weight  Body mass index (BMI) is a measurement that can be used to identify possible weight problems. It estimates body fat based on height and weight. Your health care provider can help determine your BMI and help you achieve or maintain a healthy weight.  For females 76 years of age and older:  A BMI below 18.5 is considered underweight.  A BMI of 18.5 to 24.9 is normal.  A BMI of 25 to 29.9 is considered overweight.  A BMI of 30 and above is considered obese. Watch levels of cholesterol and blood lipids  You should start having your blood tested for lipids and cholesterol at 62 years of age, then have this test every 5 years.  You may need to have your cholesterol levels checked more often if:  Your lipid or  cholesterol levels are high.  You are older than 62 years of age.  You are at high risk for heart disease. Cancer screening Lung Cancer  Lung cancer screening is recommended for adults 64-42 years old who are at high risk for lung cancer because of a history of smoking.  A yearly low-dose CT scan of the lungs is recommended for people who:  Currently smoke.  Have quit within the past 15 years.  Have at least a 30-pack-year history of smoking. A pack year is smoking an average of one pack of cigarettes a day for 1 year.  Yearly screening should continue until it has been 15 years since you quit.  Yearly screening should stop if you develop a health problem that would prevent you from having lung cancer treatment. Breast Cancer  Practice breast self-awareness. This means understanding how your breasts normally appear and feel.  It also means doing regular breast self-exams. Let your health care provider know about any changes, no matter how small.  If you are in your 20s or 30s, you should have a clinical breast exam (CBE) by a health care provider every 1-3 years as part of a regular health exam.  If you are 34 or older, have a CBE every year. Also consider having a breast X-ray (mammogram) every year.  If you have a family history of breast cancer, talk to your health care provider about genetic screening.  If you are at high risk for breast cancer, talk  to your health care provider about having an MRI and a mammogram every year.  Breast cancer gene (BRCA) assessment is recommended for women who have family members with BRCA-related cancers. BRCA-related cancers include:  Breast.  Ovarian.  Tubal.  Peritoneal cancers.  Results of the assessment will determine the need for genetic counseling and BRCA1 and BRCA2 testing. Cervical Cancer  Your health care provider may recommend that you be screened regularly for cancer of the pelvic organs (ovaries, uterus, and vagina).  This screening involves a pelvic examination, including checking for microscopic changes to the surface of your cervix (Pap test). You may be encouraged to have this screening done every 3 years, beginning at age 24.  For women ages 66-65, health care providers may recommend pelvic exams and Pap testing every 3 years, or they may recommend the Pap and pelvic exam, combined with testing for human papilloma virus (HPV), every 5 years. Some types of HPV increase your risk of cervical cancer. Testing for HPV may also be done on women of any age with unclear Pap test results.  Other health care providers may not recommend any screening for nonpregnant women who are considered low risk for pelvic cancer and who do not have symptoms. Ask your health care provider if a screening pelvic exam is right for you.  If you have had past treatment for cervical cancer or a condition that could lead to cancer, you need Pap tests and screening for cancer for at least 20 years after your treatment. If Pap tests have been discontinued, your risk factors (such as having a new sexual partner) need to be reassessed to determine if screening should resume. Some women have medical problems that increase the chance of getting cervical cancer. In these cases, your health care provider may recommend more frequent screening and Pap tests. Colorectal Cancer  This type of cancer can be detected and often prevented.  Routine colorectal cancer screening usually begins at 61 years of age and continues through 62 years of age.  Your health care provider may recommend screening at an earlier age if you have risk factors for colon cancer.  Your health care provider may also recommend using home test kits to check for hidden blood in the stool.  A small camera at the end of a tube can be used to examine your colon directly (sigmoidoscopy or colonoscopy). This is done to check for the earliest forms of colorectal cancer.  Routine  screening usually begins at age 41.  Direct examination of the colon should be repeated every 5-10 years through 62 years of age. However, you may need to be screened more often if early forms of precancerous polyps or small growths are found. Skin Cancer  Check your skin from head to toe regularly.  Tell your health care provider about any new moles or changes in moles, especially if there is a change in a mole's shape or color.  Also tell your health care provider if you have a mole that is larger than the size of a pencil eraser.  Always use sunscreen. Apply sunscreen liberally and repeatedly throughout the day.  Protect yourself by wearing long sleeves, pants, a wide-brimmed hat, and sunglasses whenever you are outside. Heart disease, diabetes, and high blood pressure  High blood pressure causes heart disease and increases the risk of stroke. High blood pressure is more likely to develop in:  People who have blood pressure in the high end of the normal range (130-139/85-89 mm Hg).  People who are overweight or obese.  People who are African American.  If you are 59-24 years of age, have your blood pressure checked every 3-5 years. If you are 34 years of age or older, have your blood pressure checked every year. You should have your blood pressure measured twice-once when you are at a hospital or clinic, and once when you are not at a hospital or clinic. Record the average of the two measurements. To check your blood pressure when you are not at a hospital or clinic, you can use:  An automated blood pressure machine at a pharmacy.  A home blood pressure monitor.  If you are between 29 years and 60 years old, ask your health care provider if you should take aspirin to prevent strokes.  Have regular diabetes screenings. This involves taking a blood sample to check your fasting blood sugar level.  If you are at a normal weight and have a low risk for diabetes, have this test once  every three years after 62 years of age.  If you are overweight and have a high risk for diabetes, consider being tested at a younger age or more often. Preventing infection Hepatitis B  If you have a higher risk for hepatitis B, you should be screened for this virus. You are considered at high risk for hepatitis B if:  You were born in a country where hepatitis B is common. Ask your health care provider which countries are considered high risk.  Your parents were born in a high-risk country, and you have not been immunized against hepatitis B (hepatitis B vaccine).  You have HIV or AIDS.  You use needles to inject street drugs.  You live with someone who has hepatitis B.  You have had sex with someone who has hepatitis B.  You get hemodialysis treatment.  You take certain medicines for conditions, including cancer, organ transplantation, and autoimmune conditions. Hepatitis C  Blood testing is recommended for:  Everyone born from [birth year] through 1966.  Anyone with known risk factors for hepatitis C. Sexually transmitted infections (STIs)  You should be screened for sexually transmitted infections (STIs) including gonorrhea and chlamydia if:  You are sexually active and are younger than 62 years of age.  You are older than 62 years of age and your health care provider tells you that you are at risk for this type of infection.  Your sexual activity has changed since you were last screened and you are at an increased risk for chlamydia or gonorrhea. Ask your health care provider if you are at risk.  If you do not have HIV, but are at risk, it may be recommended that you take a prescription medicine daily to prevent HIV infection. This is called pre-exposure prophylaxis (PrEP). You are considered at risk if:  You are sexually active and do not regularly use condoms or know the HIV status of your partner(s).  You take drugs by injection.  You are sexually active with a partner  who has HIV. Talk with your health care provider about whether you are at high risk of being infected with HIV. If you choose to begin PrEP, you should first be tested for HIV. You should then be tested every 3 months for as long as you are taking PrEP. Pregnancy  If you are premenopausal and you may become pregnant, ask your health care provider about preconception counseling.  If you may become pregnant, take 400 to 800 micrograms (mcg) of folic acid  every day.  If you want to prevent pregnancy, talk to your health care provider about birth control (contraception). Osteoporosis and menopause  Osteoporosis is a disease in which the bones lose minerals and strength with aging. This can result in serious bone fractures. Your risk for osteoporosis can be identified using a bone density scan.  If you are 4 years of age or older, or if you are at risk for osteoporosis and fractures, ask your health care provider if you should be screened.  Ask your health care provider whether you should take a calcium or vitamin D supplement to lower your risk for osteoporosis.  Menopause may have certain physical symptoms and risks.  Hormone replacement therapy may reduce some of these symptoms and risks. Talk to your health care provider about whether hormone replacement therapy is right for you. Follow these instructions at home:  Schedule regular health, dental, and eye exams.  Stay current with your immunizations.  Do not use any tobacco products including cigarettes, chewing tobacco, or electronic cigarettes.  If you are pregnant, do not drink alcohol.  If you are breastfeeding, limit how much and how often you drink alcohol.  Limit alcohol intake to no more than 1 drink per day for nonpregnant women. One drink equals 12 ounces of beer, 5 ounces of wine, or 1 ounces of hard liquor.  Do not use street drugs.  Do not share needles.  Ask your health care provider for help if you need support  or information about quitting drugs.  Tell your health care provider if you often feel depressed.  Tell your health care provider if you have ever been abused or do not feel safe at home. This information is not intended to replace advice given to you by your health care provider. Make sure you discuss any questions you have with your health care provider. Document Released: 05/18/2011 Document Revised: 04/09/2016 Document Reviewed: 08/06/2015 Elsevier Interactive Patient Education  2017 [employer].

## 2017-03-23 NOTE — Progress Notes (Signed)
Subjective:    Patient ID: Lori Cooper, female    DOB: 07/15/1956, 62 y.o.   MRN: [id number]  PT presents to the office today for CPE with pap.  Gynecologic Exam  The patient's pertinent negatives include no genital itching or vaginal discharge. Associated symptoms include back pain.  Arthritis  Presents for follow-up visit. She complains of pain and stiffness. Affected locations include the left MCP, right MCP, right knee, left knee, left shoulder and right shoulder. Her pain is at a severity of 8/10.  Anxiety  Presents for follow-up visit. Symptoms include decreased concentration, excessive worry, irritability and nervous/anxious behavior. Patient reports no restlessness. Symptoms occur occasionally. The quality of sleep is good.    Depression         This is a chronic problem.  The current episode started more than 1 year ago.   The onset quality is gradual.   The problem occurs intermittently.  Associated symptoms include decreased concentration, irritable and sad.  Associated symptoms include no helplessness, no hopelessness and no restlessness.  Past treatments include SSRIs - Selective serotonin reuptake inhibitors.  Past medical history includes anxiety.   Hyperlipidemia  This is a chronic problem. The current episode started more than 1 year ago. The problem is uncontrolled. Recent lipid tests were reviewed and are high. Exacerbating diseases include obesity. Current antihyperlipidemic treatment includes statins. The current treatment provides moderate improvement of lipids.  Gastroesophageal Reflux  She complains of dysphagia and heartburn. This is a chronic problem. The current episode started more than 1 year ago. The problem occurs occasionally. She has tried a PPI for the symptoms. The treatment provided moderate relief.  RLS Pt was taking requip, but states made her feel "weird". Pt currently taking ativan at night that helps.     Review of Systems  Constitutional: Positive  for irritability.  Gastrointestinal: Positive for dysphagia and heartburn.  Genitourinary: Negative for vaginal discharge.  Musculoskeletal: Positive for arthralgias, arthritis, back pain and stiffness.  Psychiatric/Behavioral: Positive for decreased concentration and depression. The patient is nervous/anxious.   All other systems reviewed and are negative.      Objective:   Physical Exam  Constitutional: She is oriented to person, place, and time. She appears well-developed and well-nourished. She is irritable. No distress.  HENT:  Head: Normocephalic and atraumatic.  Right Ear: External ear normal.  Nose: Nose normal.  Mouth/Throat: Oropharynx is clear and moist.  Eyes: Pupils are equal, round, and reactive to light.  Neck: Normal range of motion. Neck supple. No thyromegaly present.  Cardiovascular: Normal rate, regular rhythm, normal heart sounds and intact distal pulses.   No murmur heard. Pulmonary/Chest: Effort normal and breath sounds normal. No respiratory distress. She has no wheezes. Right breast exhibits no inverted nipple, no mass, no nipple discharge, no skin change and no tenderness. Left breast exhibits no mass, no nipple discharge, no skin change and no tenderness. Breasts are symmetrical.  Abdominal: Soft. Bowel sounds are normal. She exhibits no distension. There is no tenderness.  Genitourinary: Vagina normal.  Genitourinary Comments: Bimanual exam- no adnexal masses or tenderness, ovaries nonpalpable   Cervix parous and pink- No discharge   Musculoskeletal: Normal range of motion. She exhibits no edema or tenderness.  Neurological: She is alert and oriented to person, place, and time. She has normal reflexes. No cranial nerve deficit.  Skin: Skin is warm and dry.  Psychiatric: She has a normal mood and affect. Her behavior is normal. Judgment and thought content normal.  Vitals reviewed.     BP 111/61   Pulse 82   Temp 97.7 F (36.5 C) (Oral)   Ht 5' 5"  (1.651 m)   Wt 222 lb (100.7 kg)   BMI 36.94 kg/m      Assessment & Plan:  1. Gynecologic exam normal - Urinalysis, Complete - CBC with Differential/Platelet - CMP14+EGFR - Pap IG w/ reflex to HPV when ASC-U  2. Annual physical exam - CBC with Differential/Platelet - CMP14+EGFR - Thyroid Panel With TSH - VITAMIN D 25 Hydroxy (Vit-D Deficiency, Fractures) - Pap IG w/ reflex to HPV when ASC-U  3. Gastroesophageal reflux disease with esophagitis - CBC with Differential/Platelet - CMP14+EGFR  4. Arthritis - CBC with Differential/Platelet - CMP14+EGFR - traMADol (ULTRAM) 50 MG tablet; Take 2 tablets (100 mg total) by mouth every 12 (twelve) hours as needed.  Dispense: 90 tablet; Refill: 3  5. Restless legs - CBC with Differential/Platelet - CMP14+EGFR  6. Class 2 obesity with serious comorbidity and body mass index (BMI) of 38.0 to 38.9 in adult, unspecified obesity type - CBC with Differential/Platelet - CMP14+EGFR  7. Mixed hyperlipidemia - CBC with Differential/Platelet - CMP14+EGFR  8. Generalized anxiety disorder - CBC with Differential/Platelet - CMP14+EGFR  9. Depression, unspecified depression type - CBC with Differential/Platelet - CMP14+EGFR  10. Colon cancer screening - CBC with Differential/Platelet - CMP14+EGFR - Fecal occult blood, imunochemical; Future   Continue all meds Labs pending Health Maintenance reviewed Diet and exercise encouraged RTO 6 months   Evelina Dun, FNP

## 2017-03-24 DIAGNOSIS — M5136 Other intervertebral disc degeneration, lumbar region: Secondary | ICD-10-CM | POA: Diagnosis not present

## 2017-03-24 DIAGNOSIS — M1711 Unilateral primary osteoarthritis, right knee: Secondary | ICD-10-CM | POA: Diagnosis not present

## 2017-03-24 DIAGNOSIS — G8929 Other chronic pain: Secondary | ICD-10-CM | POA: Diagnosis not present

## 2017-03-24 DIAGNOSIS — M533 Sacrococcygeal disorders, not elsewhere classified: Secondary | ICD-10-CM | POA: Diagnosis not present

## 2017-03-24 LAB — CBC WITH DIFFERENTIAL/PLATELET
Basophils Absolute: 0.1 10*3/uL (ref 0.0–0.2)
Basos: 1 %
EOS (ABSOLUTE): 0.4 10*3/uL (ref 0.0–0.4)
Eos: 5 %
Hematocrit: 40.7 % (ref 34.0–46.6)
Hemoglobin: 13.1 g/dL (ref 11.1–15.9)
Immature Grans (Abs): 0 10*3/uL (ref 0.0–0.1)
Immature Granulocytes: 0 %
Lymphocytes Absolute: 2.7 10*3/uL (ref 0.7–3.1)
Lymphs: 39 %
MCH: 29.6 pg (ref 26.6–33.0)
MCHC: 32.2 g/dL (ref 31.5–35.7)
MCV: 92 fL (ref 79–97)
Monocytes Absolute: 0.4 10*3/uL (ref 0.1–0.9)
Monocytes: 6 %
Neutrophils Absolute: 3.4 10*3/uL (ref 1.4–7.0)
Neutrophils: 49 %
Platelets: 354 10*3/uL (ref 150–379)
RBC: 4.42 x10E6/uL (ref 3.77–5.28)
RDW: 12.7 % (ref 12.3–15.4)
WBC: 7 10*3/uL (ref 3.4–10.8)

## 2017-03-24 LAB — CMP14+EGFR
ALT: 31 IU/L (ref 0–32)
AST: 30 IU/L (ref 0–40)
Albumin/Globulin Ratio: 1.6 (ref 1.2–2.2)
Albumin: 4.1 g/dL (ref 3.6–4.8)
Alkaline Phosphatase: 112 IU/L (ref 39–117)
BUN/Creatinine Ratio: 14 (ref 12–28)
BUN: 9 mg/dL (ref 8–27)
Bilirubin Total: 0.2 mg/dL (ref 0.0–1.2)
CO2: 25 mmol/L (ref 18–29)
Calcium: 9.3 mg/dL (ref 8.7–10.3)
Chloride: 101 mmol/L (ref 96–106)
Creatinine, Ser: 0.64 mg/dL (ref 0.57–1.00)
GFR calc Af Amer: 111 mL/min/{1.73_m2} (ref >59)
GFR calc non Af Amer: 96 mL/min/{1.73_m2} (ref >59)
Globulin, Total: 2.5 g/dL (ref 1.5–4.5)
Glucose: 111 mg/dL — ABNORMAL HIGH (ref 65–99)
Potassium: 4.4 mmol/L (ref 3.5–5.2)
Sodium: 141 mmol/L (ref 134–144)
Total Protein: 6.6 g/dL (ref 6.0–8.5)

## 2017-03-24 LAB — THYROID PANEL WITH TSH
Free Thyroxine Index: 1.6 (ref 1.2–4.9)
T3 Uptake Ratio: 23 % — ABNORMAL LOW (ref 24–39)
T4, Total: 7 ug/dL (ref 4.5–12.0)
TSH: 2.09 u[IU]/mL (ref 0.450–4.500)

## 2017-03-24 LAB — VITAMIN D 25 HYDROXY (VIT D DEFICIENCY, FRACTURES): Vit D, 25-Hydroxy: 26.9 ng/mL — ABNORMAL LOW (ref 30.0–100.0)

## 2017-03-26 LAB — PAP IG W/ RFLX HPV ASCU: PAP Smear Comment: 0

## 2017-03-29 ENCOUNTER — Other Ambulatory Visit: Payer: Self-pay | Admitting: Family

## 2017-03-29 MED ORDER — VITAMIN D (ERGOCALCIFEROL) 1.25 MG (50000 UNIT) PO CAPS: 50000.0000 [IU] | ORAL_CAPSULE | ORAL | 3 refills | Status: DC

## 2017-03-31 LAB — SPECIMEN STATUS REPORT

## 2017-03-31 LAB — HGB A1C W/O EAG: Hgb A1c MFr Bld: 5.9 % — ABNORMAL HIGH (ref 4.8–5.6)

## 2017-04-15 DIAGNOSIS — M5136 Other intervertebral disc degeneration, lumbar region: Secondary | ICD-10-CM | POA: Diagnosis not present

## 2017-04-17 ENCOUNTER — Other Ambulatory Visit: Payer: Self-pay | Admitting: Family

## 2017-04-17 NOTE — Addendum Note (Signed)
Addendum  created 04/17/17 0941 by Duane Boston, MD   Sign clinical note

## 2017-04-29 ENCOUNTER — Other Ambulatory Visit: Payer: Self-pay | Admitting: Family

## 2017-04-29 DIAGNOSIS — F411 Generalized anxiety disorder: Secondary | ICD-10-CM

## 2017-04-29 DIAGNOSIS — F329 Major depressive disorder, single episode, unspecified: Secondary | ICD-10-CM

## 2017-04-29 DIAGNOSIS — F32A Depression, unspecified: Secondary | ICD-10-CM

## 2017-05-11 DIAGNOSIS — M5136 Other intervertebral disc degeneration, lumbar region: Secondary | ICD-10-CM | POA: Diagnosis not present

## 2017-05-23 ENCOUNTER — Other Ambulatory Visit: Payer: Self-pay | Admitting: Family

## 2017-05-23 DIAGNOSIS — M5441 Lumbago with sciatica, right side: Secondary | ICD-10-CM

## 2017-06-25 ENCOUNTER — Other Ambulatory Visit: Payer: Self-pay | Admitting: Family

## 2017-06-25 DIAGNOSIS — M5441 Lumbago with sciatica, right side: Secondary | ICD-10-CM

## 2017-07-13 DIAGNOSIS — M545 Low back pain: Secondary | ICD-10-CM | POA: Diagnosis not present

## 2017-07-15 ENCOUNTER — Other Ambulatory Visit: Payer: Self-pay | Admitting: Family

## 2017-07-16 NOTE — Telephone Encounter (Signed)
Next OV 09/24/17 

## 2017-08-05 DIAGNOSIS — M546 Pain in thoracic spine: Secondary | ICD-10-CM | POA: Diagnosis not present

## 2017-08-20 ENCOUNTER — Other Ambulatory Visit: Payer: Self-pay | Admitting: Family

## 2017-08-20 DIAGNOSIS — M5441 Lumbago with sciatica, right side: Secondary | ICD-10-CM

## 2017-08-20 DIAGNOSIS — K2100 Gastro-esophageal reflux disease with esophagitis, without bleeding: Secondary | ICD-10-CM

## 2017-08-25 ENCOUNTER — Other Ambulatory Visit: Payer: Self-pay | Admitting: Family

## 2017-08-25 DIAGNOSIS — F411 Generalized anxiety disorder: Secondary | ICD-10-CM

## 2017-08-25 DIAGNOSIS — F32A Depression, unspecified: Secondary | ICD-10-CM

## 2017-08-25 DIAGNOSIS — F329 Major depressive disorder, single episode, unspecified: Secondary | ICD-10-CM

## 2017-08-25 DIAGNOSIS — E8881 Metabolic syndrome: Secondary | ICD-10-CM

## 2017-08-25 DIAGNOSIS — Z713 Dietary counseling and surveillance: Secondary | ICD-10-CM

## 2017-09-20 ENCOUNTER — Other Ambulatory Visit: Payer: Self-pay | Admitting: Family

## 2017-09-20 DIAGNOSIS — M5441 Lumbago with sciatica, right side: Secondary | ICD-10-CM

## 2017-09-20 DIAGNOSIS — F411 Generalized anxiety disorder: Secondary | ICD-10-CM

## 2017-09-21 ENCOUNTER — Other Ambulatory Visit: Payer: Self-pay | Admitting: Family

## 2017-09-21 DIAGNOSIS — F411 Generalized anxiety disorder: Secondary | ICD-10-CM

## 2017-09-21 NOTE — Telephone Encounter (Signed)
Patient NTBS for follow up and lab work  

## 2017-09-21 NOTE — Telephone Encounter (Signed)
Rx called to pharmacy

## 2017-09-24 ENCOUNTER — Ambulatory Visit: Payer: PPO | Admitting: Family

## 2017-09-24 ENCOUNTER — Encounter: Payer: Self-pay | Admitting: Family

## 2017-09-24 VITALS — BP 116/66 | HR 77 | Temp 98.2°F | Ht 65.0 in | Wt 227.0 lb

## 2017-09-24 DIAGNOSIS — K2100 Gastro-esophageal reflux disease with esophagitis, without bleeding: Secondary | ICD-10-CM

## 2017-09-24 DIAGNOSIS — F411 Generalized anxiety disorder: Secondary | ICD-10-CM | POA: Diagnosis not present

## 2017-09-24 DIAGNOSIS — E559 Vitamin D deficiency, unspecified: Secondary | ICD-10-CM | POA: Diagnosis not present

## 2017-09-24 DIAGNOSIS — Z1211 Encounter for screening for malignant neoplasm of colon: Secondary | ICD-10-CM

## 2017-09-24 DIAGNOSIS — E782 Mixed hyperlipidemia: Secondary | ICD-10-CM

## 2017-09-24 DIAGNOSIS — M1990 Unspecified osteoarthritis, unspecified site: Secondary | ICD-10-CM

## 2017-09-24 DIAGNOSIS — F331 Major depressive disorder, recurrent, moderate: Secondary | ICD-10-CM

## 2017-09-24 DIAGNOSIS — Z23 Encounter for immunization: Secondary | ICD-10-CM | POA: Diagnosis not present

## 2017-09-24 DIAGNOSIS — R7303 Prediabetes: Secondary | ICD-10-CM

## 2017-09-24 DIAGNOSIS — E8881 Metabolic syndrome: Secondary | ICD-10-CM | POA: Diagnosis not present

## 2017-09-24 LAB — BAYER DCA HB A1C WAIVED: HB A1C (BAYER DCA - WAIVED): 5.5 % (ref <7.0)

## 2017-09-24 MED ORDER — PANTOPRAZOLE SODIUM 40 MG PO TBEC: 40.0000 mg | DELAYED_RELEASE_TABLET | Freq: Every day | ORAL | 1 refills | Status: DC

## 2017-09-24 MED ORDER — DEXLANSOPRAZOLE 60 MG PO CPDR: 1.0000 | DELAYED_RELEASE_CAPSULE | Freq: Every day | ORAL | 0 refills | Status: DC

## 2017-09-24 NOTE — Progress Notes (Signed)
Subjective:    Patient ID: Lori Cooper, female    DOB: Oct 10, 1956, 61 y.o.   MRN: [id number]  Pt presents to the office today for chronic follow up.  Gastroesophageal Reflux  She complains of belching, choking, coughing and heartburn. This is a chronic problem. The current episode started more than 1 year ago. The problem occurs constantly. The problem has been waxing and waning. The heartburn wakes her from sleep. The heartburn limits her activity. She has tried a PPI for the symptoms. The treatment provided moderate relief.  Arthritis  Presents for follow-up visit. She complains of pain and stiffness. Affected locations include the right MCP and left MCP. Her pain is at a severity of 5/10.  Depression         This is a chronic problem.  The current episode started more than 1 year ago.   The onset quality is gradual.   The problem occurs intermittently.  The problem has been waxing and waning since onset.  Associated symptoms include irritable, decreased interest and sad.  Associated symptoms include no helplessness and no hopelessness.     The symptoms are aggravated by family issues.  Past medical history includes anxiety.   Anxiety  Presents for follow-up visit. Symptoms include irritability. Patient reports no excessive worry or nervous/anxious behavior. Symptoms occur occasionally. The severity of symptoms is mild. The quality of sleep is good.    Hyperlipidemia  This is a chronic problem. The current episode started more than 1 year ago. The problem is uncontrolled. Recent lipid tests were reviewed and are high. Exacerbating diseases include obesity. Current antihyperlipidemic treatment includes statins. The current treatment provides moderate improvement of lipids. Risk factors for coronary artery disease include diabetes mellitus, dyslipidemia, obesity, hypertension, a sedentary lifestyle, post-menopausal and family history.  Metabolic Syndrome Pt is not active and "eats what I want".      Review of Systems  Constitutional: Positive for irritability.  Respiratory: Positive for cough and choking.   Gastrointestinal: Positive for heartburn.  Musculoskeletal: Positive for arthritis and stiffness.  Psychiatric/Behavioral: Positive for depression. The patient is not nervous/anxious.   All other systems reviewed and are negative.      Objective:   Physical Exam  Constitutional: She is oriented to person, place, and time. She appears well-developed and well-nourished. She is irritable. No distress.  HENT:  Head: Normocephalic and atraumatic.  Right Ear: External ear normal.  Left Ear: External ear normal.  Nose: Nose normal.  Mouth/Throat: Oropharynx is clear and moist.  Eyes: Pupils are equal, round, and reactive to light.  Neck: Normal range of motion. Neck supple. No thyromegaly present.  Cardiovascular: Normal rate, regular rhythm, normal heart sounds and intact distal pulses.  No murmur heard. Pulmonary/Chest: Effort normal and breath sounds normal. No respiratory distress. She has no wheezes.  Abdominal: Soft. Bowel sounds are normal. She exhibits no distension. There is no tenderness.  Musculoskeletal: Normal range of motion. She exhibits no edema or tenderness.  Neurological: She is alert and oriented to person, place, and time.  Skin: Skin is warm and dry.  Psychiatric: She has a normal mood and affect. Her behavior is normal. Judgment and thought content normal.  Vitals reviewed.     BP 116/66   Pulse 77   Temp 98.2 F (36.8 C) (Oral)   Ht _0  (1.651 m)   Wt 227 lb (103 kg)   BMI 37.77 kg/m      Assessment & Plan:  1. Arthritis - CMP14+EGFR  2. Gastroesophageal reflux disease with esophagitis Will stop dexilant and start protonix -Diet discussed- Avoid fried, spicy, citrus foods, caffeine and alcohol -Do not eat 2-3 hours before bedtime -Encouraged small frequent meals -Avoid NSAID's - CMP14+EGFR - pantoprazole (PROTONIX) 40 MG tablet;  Take 1 tablet (40 mg total) daily by mouth.  Dispense: 90 tablet; Refill: 1  3. Mixed hyperlipidemia - CMP14+EGFR - Lipid panel  4. Generalized anxiety disorder - CMP14+EGFR  5. Prediabetes - CMP14+EGFR - Bayer DCA Hb A1c Waived  6. Metabolic syndrome - ULA45+XMIW - Bayer DCA Hb A1c Waived  7. Moderate episode of recurrent major depressive disorder (HCC) - CMP14+EGFR  8. Morbid obesity (HCC) - CMP14+EGFR  9. Vitamin D deficiency - CMP14+EGFR - VITAMIN D 25 Hydroxy (Vit-D Deficiency, Fractures)  10. Colon cancer screening - CMP14+EGFR - Fecal occult blood, imunochemical; Future   Continue all meds Labs pending Health Maintenance reviewed Diet and exercise encouraged RTO 4 months   Evelina Dun, FNP

## 2017-09-24 NOTE — Patient Instructions (Signed)
Heartburn Heartburn is a type of pain or discomfort that can happen in the throat or chest. It is often described as a burning pain. It may also cause a bad taste in the mouth. Heartburn may feel worse when you lie down or bend over, and it is often worse at night. Heartburn may be caused by stomach contents that move back up into the esophagus (reflux). Follow these instructions at home: Take these actions to decrease your discomfort and to help avoid complications. Diet  Follow a diet as recommended by your health care provider. This may involve avoiding foods and drinks such as: ? Coffee and tea (with or without caffeine). ? Drinks that contain alcohol. ? Energy drinks and sports drinks. ? Carbonated drinks or sodas. ? Chocolate and cocoa. ? Peppermint and mint flavorings. ? Garlic and onions. ? Horseradish. ? Spicy and acidic foods, including peppers, chili powder, curry powder, vinegar, hot sauces, and barbecue sauce. ? Citrus fruit juices and citrus fruits, such as oranges, lemons, and limes. ? Tomato-based foods, such as red sauce, chili, salsa, and pizza with red sauce. ? Fried and fatty foods, such as donuts, french fries, potato chips, and high-fat dressings. ? High-fat meats, such as hot dogs and fatty cuts of red and white meats, such as rib eye steak, sausage, ham, and bacon. ? High-fat dairy items, such as whole milk, butter, and cream cheese.  Eat small, frequent meals instead of large meals.  Avoid drinking large amounts of liquid with your meals.  Avoid eating meals during the 2-3 hours before bedtime.  Avoid lying down right after you eat.  Do not exercise right after you eat. General instructions  Pay attention to any changes in your symptoms.  Take over-the-counter and prescription medicines only as told by your health care provider. Do not take aspirin, ibuprofen, or other NSAIDs unless your health care provider told you to do so.  Do not use any tobacco  products, including cigarettes, chewing tobacco, and e-cigarettes. If you need help quitting, ask your health care provider.  Wear loose-fitting clothing. Do not wear anything tight around your waist that causes pressure on your abdomen.  Raise (elevate) the head of your bed about 6 inches (15 cm).  Try to reduce your stress, such as with yoga or meditation. If you need help reducing stress, ask your health care provider.  If you are overweight, reduce your weight to an amount that is healthy for you. Ask your health care provider for guidance about a safe weight loss goal.  Keep all follow-up visits as told by your health care provider. This is important. Contact a health care provider if:  You have new symptoms.  You have unexplained weight loss.  You have difficulty swallowing, or it hurts to swallow.  You have wheezing or a persistent cough.  Your symptoms do not improve with treatment.  You have frequent heartburn for more than two weeks. Get help right away if:  You have pain in your arms, neck, jaw, teeth, or back.  You feel sweaty, dizzy, or light-headed.  You have chest pain or shortness of breath.  You vomit and your vomit looks like blood or coffee grounds.  Your stool is bloody or black. This information is not intended to replace advice given to you by your health care provider. Make sure you discuss any questions you have with your health care provider. Document Released: 03/21/2009 Document Revised: 04/09/2016 Document Reviewed: 02/27/2015 Elsevier Interactive Patient Education  2017 Elsevier   Inc.  

## 2017-09-24 NOTE — Addendum Note (Signed)
Addended by: Evelina Dun A on: 09/24/2017 09:14 AM   Modules accepted: Orders

## 2017-09-25 LAB — CMP14+EGFR
ALT: 32 IU/L (ref 0–32)
AST: 31 IU/L (ref 0–40)
Albumin/Globulin Ratio: 2.1 (ref 1.2–2.2)
Albumin: 4.2 g/dL (ref 3.6–4.8)
Alkaline Phosphatase: 115 IU/L (ref 39–117)
BUN/Creatinine Ratio: 11 — ABNORMAL LOW (ref 12–28)
BUN: 8 mg/dL (ref 8–27)
Bilirubin Total: 0.4 mg/dL (ref 0.0–1.2)
CO2: 28 mmol/L (ref 20–29)
Calcium: 9.7 mg/dL (ref 8.7–10.3)
Chloride: 100 mmol/L (ref 96–106)
Creatinine, Ser: 0.72 mg/dL (ref 0.57–1.00)
GFR calc Af Amer: 104 mL/min/{1.73_m2} (ref >59)
GFR calc non Af Amer: 90 mL/min/{1.73_m2} (ref >59)
Globulin, Total: 2 g/dL (ref 1.5–4.5)
Glucose: 97 mg/dL (ref 65–99)
Potassium: 4.8 mmol/L (ref 3.5–5.2)
Sodium: 141 mmol/L (ref 134–144)
Total Protein: 6.2 g/dL (ref 6.0–8.5)

## 2017-09-25 LAB — LIPID PANEL
Chol/HDL Ratio: 3.8 ratio (ref 0.0–4.4)
Cholesterol, Total: 202 mg/dL — ABNORMAL HIGH (ref 100–199)
HDL: 53 mg/dL (ref >39)
LDL Calculated: 101 mg/dL — ABNORMAL HIGH (ref 0–99)
Triglycerides: 240 mg/dL — ABNORMAL HIGH (ref 0–149)
VLDL Cholesterol Cal: 48 mg/dL — ABNORMAL HIGH (ref 5–40)

## 2017-09-25 LAB — VITAMIN D 25 HYDROXY (VIT D DEFICIENCY, FRACTURES): Vit D, 25-Hydroxy: 34 ng/mL (ref 30.0–100.0)

## 2017-10-14 ENCOUNTER — Other Ambulatory Visit: Payer: Self-pay | Admitting: Family

## 2017-10-25 ENCOUNTER — Other Ambulatory Visit: Payer: Self-pay | Admitting: Family

## 2017-10-25 DIAGNOSIS — F411 Generalized anxiety disorder: Secondary | ICD-10-CM

## 2017-10-26 NOTE — Telephone Encounter (Signed)
Last seen 09/24/17, last filled 09/21/17

## 2017-10-28 NOTE — Telephone Encounter (Signed)
rx phoned in

## 2017-11-25 ENCOUNTER — Other Ambulatory Visit: Payer: Self-pay | Admitting: Family

## 2017-11-25 DIAGNOSIS — M5441 Lumbago with sciatica, right side: Secondary | ICD-10-CM

## 2017-12-02 DIAGNOSIS — M546 Pain in thoracic spine: Secondary | ICD-10-CM | POA: Diagnosis not present

## 2017-12-02 DIAGNOSIS — M47816 Spondylosis without myelopathy or radiculopathy, lumbar region: Secondary | ICD-10-CM | POA: Diagnosis not present

## 2017-12-02 DIAGNOSIS — M545 Low back pain: Secondary | ICD-10-CM | POA: Diagnosis not present

## 2017-12-14 DIAGNOSIS — M47816 Spondylosis without myelopathy or radiculopathy, lumbar region: Secondary | ICD-10-CM | POA: Diagnosis not present

## 2017-12-15 ENCOUNTER — Other Ambulatory Visit: Payer: Self-pay | Admitting: Family

## 2017-12-15 DIAGNOSIS — F329 Major depressive disorder, single episode, unspecified: Secondary | ICD-10-CM

## 2017-12-15 DIAGNOSIS — R2232 Localized swelling, mass and lump, left upper limb: Secondary | ICD-10-CM | POA: Diagnosis not present

## 2017-12-15 DIAGNOSIS — M13841 Other specified arthritis, right hand: Secondary | ICD-10-CM | POA: Diagnosis not present

## 2017-12-15 DIAGNOSIS — Z9889 Other specified postprocedural states: Secondary | ICD-10-CM | POA: Diagnosis not present

## 2017-12-15 DIAGNOSIS — F411 Generalized anxiety disorder: Secondary | ICD-10-CM

## 2017-12-15 DIAGNOSIS — F32A Depression, unspecified: Secondary | ICD-10-CM

## 2017-12-22 DIAGNOSIS — M1711 Unilateral primary osteoarthritis, right knee: Secondary | ICD-10-CM | POA: Diagnosis not present

## 2018-01-03 ENCOUNTER — Other Ambulatory Visit: Payer: Self-pay | Admitting: Family

## 2018-01-03 DIAGNOSIS — M5441 Lumbago with sciatica, right side: Secondary | ICD-10-CM

## 2018-01-03 NOTE — Telephone Encounter (Signed)
last seen 09/24/17  [facility]

## 2018-01-10 ENCOUNTER — Ambulatory Visit (INDEPENDENT_AMBULATORY_CARE_PROVIDER_SITE_OTHER): Payer: PPO | Admitting: Family

## 2018-01-10 ENCOUNTER — Encounter: Payer: Self-pay | Admitting: Family

## 2018-01-10 ENCOUNTER — Ambulatory Visit (INDEPENDENT_AMBULATORY_CARE_PROVIDER_SITE_OTHER): Payer: PPO

## 2018-01-10 VITALS — BP 132/72 | HR 99 | Temp 98.1°F | Ht 65.0 in | Wt 231.0 lb

## 2018-01-10 DIAGNOSIS — R5383 Other fatigue: Secondary | ICD-10-CM

## 2018-01-10 DIAGNOSIS — R059 Cough, unspecified: Secondary | ICD-10-CM

## 2018-01-10 DIAGNOSIS — R0602 Shortness of breath: Secondary | ICD-10-CM | POA: Diagnosis not present

## 2018-01-10 DIAGNOSIS — R05 Cough: Secondary | ICD-10-CM

## 2018-01-10 DIAGNOSIS — J189 Pneumonia, unspecified organism: Secondary | ICD-10-CM | POA: Diagnosis not present

## 2018-01-10 MED ORDER — DOXYCYCLINE HYCLATE 100 MG PO TABS
100.0000 mg | ORAL_TABLET | Freq: Two times a day (BID) | ORAL | 0 refills | Status: DC
Start: 2018-01-10 — End: 2018-01-19

## 2018-01-10 MED ORDER — PREDNISONE 10 MG (21) PO TBPK: ORAL_TABLET | ORAL | 0 refills | Status: DC

## 2018-01-10 NOTE — Progress Notes (Signed)
Subjective:    Patient ID: Lori Cooper, female    DOB: [birth year], 62 y.o.   MRN: [id number]  PT presents to the office today with cough and SOB that is worsening. She requests her Vit B 12 to be checked today because several family members have this and has been having fatigue and SOB.  Cough  This is a new problem. The current episode started 1 to 4 weeks ago. The problem has been gradually worsening. The problem occurs every few minutes. The cough is non-productive. Associated symptoms include chills, myalgias, a sore throat, shortness of breath and wheezing. Pertinent negatives include no ear congestion, ear pain, fever, headaches, nasal congestion, postnasal drip or rhinorrhea. Risk factors: quit smoking 5 years ago. She has tried rest and OTC cough suppressant for the symptoms. The treatment provided mild relief.      Review of Systems  Constitutional: Positive for chills. Negative for fever.  HENT: Positive for sore throat. Negative for ear pain, postnasal drip and rhinorrhea.   Respiratory: Positive for cough, shortness of breath and wheezing.   Musculoskeletal: Positive for myalgias.  Neurological: Negative for headaches.  All other systems reviewed and are negative.      Objective:   Physical Exam  Constitutional: She is oriented to person, place, and time. She appears well-developed and well-nourished. No distress.  HENT:  Head: Normocephalic and atraumatic.  Right Ear: External ear normal.  Left Ear: External ear normal.  Nose: Mucosal edema and rhinorrhea present.  Mouth/Throat: Posterior oropharyngeal erythema present.  Eyes: Pupils are equal, round, and reactive to light.  Neck: Normal range of motion. Neck supple. No thyromegaly present.  Cardiovascular: Normal rate, regular rhythm, normal heart sounds and intact distal pulses.  No murmur heard. Pulmonary/Chest: Effort normal. No respiratory distress. She has decreased breath sounds. She has no wheezes. She has  rales.  Abdominal: Soft. Bowel sounds are normal. She exhibits no distension. There is no tenderness.  Musculoskeletal: Normal range of motion. She exhibits no edema or tenderness.  Neurological: She is alert and oriented to person, place, and time.  Skin: Skin is warm and dry.  Psychiatric: She has a normal mood and affect. Her behavior is normal. Judgment and thought content normal.  Vitals reviewed.     BP 132/72   Pulse 99   Temp 98.1 F (36.7 C) (Oral)   Ht '5\' 5"'$  (1.651 m)   Wt 231 lb (104.8 kg)   SpO2 95% Comment: Dropped to 91% with walking around buiding twice.  BMI 38.44 kg/m      Assessment & Plan:  1. Cough - BMP8+EGFR - DG Chest 2 View; Future  2. SOB (shortness of breath) - Anemia Profile B - BMP8+EGFR - DG Chest 2 View; Future  3. Fatigue, unspecified type - Anemia Profile B - BMP8+EGFR - DG Chest 2 View; Future  4. Atypical pneumonia - Take meds as prescribed - Use a cool mist humidifier  -Use saline nose sprays frequently -Force fluids -For any cough or congestion  Use plain Mucinex- regular strength or max strength is fine -For fever or aces or pains- take tylenol or ibuprofen. -Throat lozenges if help -RTO prn or if symptoms worsen or do not improve  - doxycycline (VIBRA-TABS) 100 MG tablet; Take 1 tablet (100 mg total) by mouth 2 (two) times daily.  Dispense: 20 tablet; Refill: 0 - predniSONE (STERAPRED UNI-PAK 21 TAB) 10 MG (21) TBPK tablet; Use as directed  Dispense: 21 tablet; Refill: 0  Evelina Dun, FNP

## 2018-01-10 NOTE — Patient Instructions (Signed)

## 2018-01-11 ENCOUNTER — Encounter: Payer: Self-pay | Admitting: Family

## 2018-01-11 LAB — BMP8+EGFR
BUN/Creatinine Ratio: 10 — ABNORMAL LOW (ref 12–28)
BUN: 8 mg/dL (ref 8–27)
CO2: 23 mmol/L (ref 20–29)
Calcium: 9.3 mg/dL (ref 8.7–10.3)
Chloride: 103 mmol/L (ref 96–106)
Creatinine, Ser: 0.84 mg/dL (ref 0.57–1.00)
GFR calc Af Amer: 86 mL/min/{1.73_m2} (ref >59)
GFR calc non Af Amer: 74 mL/min/{1.73_m2} (ref >59)
Glucose: 114 mg/dL — ABNORMAL HIGH (ref 65–99)
Potassium: 4.7 mmol/L (ref 3.5–5.2)
Sodium: 142 mmol/L (ref 134–144)

## 2018-01-11 LAB — ANEMIA PROFILE B
Basophils Absolute: 0.1 10*3/uL (ref 0.0–0.2)
Basos: 1 %
EOS (ABSOLUTE): 0.2 10*3/uL (ref 0.0–0.4)
Eos: 2 %
Ferritin: 48 ng/mL (ref 15–150)
Folate: >20 ng/mL (ref >3.0)
Hematocrit: 40.1 % (ref 34.0–46.6)
Hemoglobin: 13.3 g/dL (ref 11.1–15.9)
Immature Grans (Abs): 0 10*3/uL (ref 0.0–0.1)
Immature Granulocytes: 0 %
Iron Saturation: 20 % (ref 15–55)
Iron: 61 ug/dL (ref 27–139)
Lymphocytes Absolute: 2.7 10*3/uL (ref 0.7–3.1)
Lymphs: 33 %
MCH: 28.6 pg (ref 26.6–33.0)
MCHC: 33.2 g/dL (ref 31.5–35.7)
MCV: 86 fL (ref 79–97)
Monocytes Absolute: 0.5 10*3/uL (ref 0.1–0.9)
Monocytes: 6 %
Neutrophils Absolute: 4.7 10*3/uL (ref 1.4–7.0)
Neutrophils: 58 %
Platelets: 349 10*3/uL (ref 150–379)
RBC: 4.65 x10E6/uL (ref 3.77–5.28)
RDW: 13.7 % (ref 12.3–15.4)
Retic Ct Pct: 1.5 % (ref 0.6–2.6)
Total Iron Binding Capacity: 312 ug/dL (ref 250–450)
UIBC: 251 ug/dL (ref 118–369)
Vitamin B-12: 487 pg/mL (ref 232–1245)
WBC: 8.1 10*3/uL (ref 3.4–10.8)

## 2018-01-16 ENCOUNTER — Other Ambulatory Visit: Payer: Self-pay

## 2018-01-16 ENCOUNTER — Emergency Department (HOSPITAL_COMMUNITY): Payer: PPO

## 2018-01-16 ENCOUNTER — Encounter (HOSPITAL_COMMUNITY): Payer: Self-pay | Admitting: Emergency Medicine

## 2018-01-16 ENCOUNTER — Inpatient Hospital Stay (HOSPITAL_COMMUNITY)
Admission: EM | Admit: 2018-01-16 | Discharge: 2018-01-19 | DRG: 175 | Disposition: A | Discharge status: Home / Self Care | Payer: PPO | Attending: Internal Medicine | Admitting: Internal Medicine

## 2018-01-16 DIAGNOSIS — M5137 Other intervertebral disc degeneration, lumbosacral region: Secondary | ICD-10-CM | POA: Diagnosis not present

## 2018-01-16 DIAGNOSIS — I82409 Acute embolism and thrombosis of unspecified deep veins of unspecified lower extremity: Secondary | ICD-10-CM

## 2018-01-16 DIAGNOSIS — J449 Chronic obstructive pulmonary disease, unspecified: Secondary | ICD-10-CM | POA: Diagnosis present

## 2018-01-16 DIAGNOSIS — G2581 Restless legs syndrome: Secondary | ICD-10-CM | POA: Diagnosis not present

## 2018-01-16 DIAGNOSIS — J9601 Acute respiratory failure with hypoxia: Secondary | ICD-10-CM | POA: Diagnosis not present

## 2018-01-16 DIAGNOSIS — Z79899 Other long term (current) drug therapy: Secondary | ICD-10-CM | POA: Diagnosis not present

## 2018-01-16 DIAGNOSIS — R0602 Shortness of breath: Secondary | ICD-10-CM | POA: Diagnosis not present

## 2018-01-16 DIAGNOSIS — M5030 Other cervical disc degeneration, unspecified cervical region: Secondary | ICD-10-CM | POA: Diagnosis not present

## 2018-01-16 DIAGNOSIS — Z87891 Personal history of nicotine dependence: Secondary | ICD-10-CM | POA: Diagnosis not present

## 2018-01-16 DIAGNOSIS — E782 Mixed hyperlipidemia: Secondary | ICD-10-CM | POA: Diagnosis not present

## 2018-01-16 DIAGNOSIS — I2699 Other pulmonary embolism without acute cor pulmonale: Secondary | ICD-10-CM | POA: Diagnosis not present

## 2018-01-16 DIAGNOSIS — M549 Dorsalgia, unspecified: Secondary | ICD-10-CM | POA: Diagnosis present

## 2018-01-16 DIAGNOSIS — I5030 Unspecified diastolic (congestive) heart failure: Secondary | ICD-10-CM | POA: Diagnosis not present

## 2018-01-16 DIAGNOSIS — K219 Gastro-esophageal reflux disease without esophagitis: Secondary | ICD-10-CM | POA: Diagnosis present

## 2018-01-16 DIAGNOSIS — G8929 Other chronic pain: Secondary | ICD-10-CM | POA: Diagnosis not present

## 2018-01-16 DIAGNOSIS — E785 Hyperlipidemia, unspecified: Secondary | ICD-10-CM | POA: Diagnosis present

## 2018-01-16 DIAGNOSIS — F411 Generalized anxiety disorder: Secondary | ICD-10-CM | POA: Diagnosis not present

## 2018-01-16 DIAGNOSIS — Z8249 Family history of ischemic heart disease and other diseases of the circulatory system: Secondary | ICD-10-CM

## 2018-01-16 DIAGNOSIS — F32A Depression, unspecified: Secondary | ICD-10-CM | POA: Diagnosis present

## 2018-01-16 DIAGNOSIS — R402413 Glasgow coma scale score 13-15, at hospital admission: Secondary | ICD-10-CM | POA: Diagnosis not present

## 2018-01-16 DIAGNOSIS — R7989 Other specified abnormal findings of blood chemistry: Secondary | ICD-10-CM | POA: Diagnosis present

## 2018-01-16 DIAGNOSIS — I2602 Saddle embolus of pulmonary artery with acute cor pulmonale: Secondary | ICD-10-CM | POA: Diagnosis not present

## 2018-01-16 DIAGNOSIS — Z79891 Long term (current) use of opiate analgesic: Secondary | ICD-10-CM

## 2018-01-16 DIAGNOSIS — K449 Diaphragmatic hernia without obstruction or gangrene: Secondary | ICD-10-CM | POA: Diagnosis present

## 2018-01-16 DIAGNOSIS — Z7982 Long term (current) use of aspirin: Secondary | ICD-10-CM

## 2018-01-16 DIAGNOSIS — F329 Major depressive disorder, single episode, unspecified: Secondary | ICD-10-CM | POA: Diagnosis not present

## 2018-01-16 DIAGNOSIS — M170 Bilateral primary osteoarthritis of knee: Secondary | ICD-10-CM | POA: Diagnosis not present

## 2018-01-16 DIAGNOSIS — I2609 Other pulmonary embolism with acute cor pulmonale: Secondary | ICD-10-CM | POA: Diagnosis not present

## 2018-01-16 DIAGNOSIS — M180 Bilateral primary osteoarthritis of first carpometacarpal joints: Secondary | ICD-10-CM | POA: Diagnosis not present

## 2018-01-16 DIAGNOSIS — R05 Cough: Secondary | ICD-10-CM | POA: Diagnosis not present

## 2018-01-16 LAB — COMPREHENSIVE METABOLIC PANEL
ALT: 29 U/L (ref 14–54)
AST: 26 U/L (ref 15–41)
Albumin: 3.7 g/dL (ref 3.5–5.0)
Alkaline Phosphatase: 115 U/L (ref 38–126)
Anion gap: 14 (ref 5–15)
BUN: 25 mg/dL — ABNORMAL HIGH (ref 6–20)
CO2: 24 mmol/L (ref 22–32)
Calcium: 9 mg/dL (ref 8.9–10.3)
Chloride: 105 mmol/L (ref 101–111)
Creatinine, Ser: 1.02 mg/dL — ABNORMAL HIGH (ref 0.44–1.00)
GFR calc Af Amer: >60 mL/min (ref >60)
GFR calc non Af Amer: 57 mL/min — ABNORMAL LOW (ref >60)
Glucose, Bld: 129 mg/dL — ABNORMAL HIGH (ref 65–99)
Potassium: 4 mmol/L (ref 3.5–5.1)
Sodium: 143 mmol/L (ref 135–145)
Total Bilirubin: 0.6 mg/dL (ref 0.3–1.2)
Total Protein: 6.8 g/dL (ref 6.5–8.1)

## 2018-01-16 LAB — CBC WITH DIFFERENTIAL/PLATELET
Basophils Absolute: 0.1 10*3/uL (ref 0.0–0.1)
Basophils Relative: 0 %
Eosinophils Absolute: 0.3 10*3/uL (ref 0.0–0.7)
Eosinophils Relative: 2 %
HCT: 45.4 % (ref 36.0–46.0)
Hemoglobin: 14.4 g/dL (ref 12.0–15.0)
Lymphocytes Relative: 47 %
Lymphs Abs: 6.4 10*3/uL — ABNORMAL HIGH (ref 0.7–4.0)
MCH: 28.8 pg (ref 26.0–34.0)
MCHC: 31.7 g/dL (ref 30.0–36.0)
MCV: 90.8 fL (ref 78.0–100.0)
Monocytes Absolute: 0.9 10*3/uL (ref 0.1–1.0)
Monocytes Relative: 7 %
Neutro Abs: 5.9 10*3/uL (ref 1.7–7.7)
Neutrophils Relative %: 44 %
Platelets: 325 10*3/uL (ref 150–400)
RBC: 5 MIL/uL (ref 3.87–5.11)
RDW: 14.1 % (ref 11.5–15.5)
WBC: 13.5 10*3/uL — ABNORMAL HIGH (ref 4.0–10.5)

## 2018-01-16 LAB — TROPONIN I
Troponin I: 0.07 ng/mL (ref <0.03)
Troponin I: 0.08 ng/mL (ref <0.03)

## 2018-01-16 LAB — CK: Total CK: 31 U/L — ABNORMAL LOW (ref 38–234)

## 2018-01-16 LAB — D-DIMER, QUANTITATIVE: D-Dimer, Quant: 2.15 ug/mL-FEU — ABNORMAL HIGH (ref 0.00–0.50)

## 2018-01-16 MED ORDER — ALBUTEROL SULFATE (2.5 MG/3ML) 0.083% IN NEBU: 3.0000 mL | INHALATION_SOLUTION | Freq: Four times a day (QID) | RESPIRATORY_TRACT | Status: DC | PRN

## 2018-01-16 MED ORDER — IOPAMIDOL (ISOVUE-370) INJECTION 76%
100.0000 mL | Freq: Once | INTRAVENOUS | Status: AC | PRN
  Administered 2018-01-16: 100 mL via INTRAVENOUS

## 2018-01-16 MED ORDER — ONDANSETRON HCL 4 MG/2ML IJ SOLN: 4.0000 mg | Freq: Four times a day (QID) | INTRAMUSCULAR | Status: DC | PRN

## 2018-01-16 MED ORDER — ASPIRIN EC 81 MG PO TBEC
81.0000 mg | DELAYED_RELEASE_TABLET | ORAL | Status: DC
  Administered 2018-01-17 – 2018-01-19 (×2): 81 mg via ORAL
  Filled 2018-01-16 (×3): qty 1

## 2018-01-16 MED ORDER — LACTATED RINGERS IV SOLN
INTRAVENOUS | Status: AC
  Administered 2018-01-17: 02:00:00 via INTRAVENOUS

## 2018-01-16 MED ORDER — HEPARIN (PORCINE) IN NACL 100-0.45 UNIT/ML-% IJ SOLN
1450.0000 [IU]/h | INTRAMUSCULAR | Status: DC
  Administered 2018-01-16: 1450 [IU]/h via INTRAVENOUS
  Filled 2018-01-16: qty 250

## 2018-01-16 MED ORDER — ONDANSETRON HCL 4 MG PO TABS: 4.0000 mg | ORAL_TABLET | Freq: Four times a day (QID) | ORAL | Status: DC | PRN

## 2018-01-16 MED ORDER — FLUOXETINE HCL 20 MG PO CAPS
80.0000 mg | ORAL_CAPSULE | Freq: Every day | ORAL | Status: DC
  Administered 2018-01-17 – 2018-01-19 (×3): 80 mg via ORAL
  Filled 2018-01-16 (×3): qty 4

## 2018-01-16 MED ORDER — ACETAMINOPHEN 325 MG PO TABS: 650.0000 mg | ORAL_TABLET | Freq: Four times a day (QID) | ORAL | Status: DC | PRN

## 2018-01-16 MED ORDER — BUPROPION HCL ER (XL) 150 MG PO TB24
150.0000 mg | ORAL_TABLET | Freq: Every day | ORAL | Status: DC
  Administered 2018-01-17 – 2018-01-19 (×3): 150 mg via ORAL
  Filled 2018-01-16 (×5): qty 1

## 2018-01-16 MED ORDER — ALBUTEROL SULFATE (2.5 MG/3ML) 0.083% IN NEBU
5.0000 mg | INHALATION_SOLUTION | Freq: Once | RESPIRATORY_TRACT | Status: AC
  Administered 2018-01-16: 5 mg via RESPIRATORY_TRACT
  Filled 2018-01-16: qty 6

## 2018-01-16 MED ORDER — HEPARIN SODIUM (PORCINE) 5000 UNIT/ML IJ SOLN
4000.0000 [IU] | Freq: Once | INTRAMUSCULAR | Status: AC
  Administered 2018-01-16: 4000 [IU] via INTRAVENOUS
  Filled 2018-01-16: qty 1

## 2018-01-16 MED ORDER — CYCLOBENZAPRINE HCL 10 MG PO TABS: 10.0000 mg | ORAL_TABLET | Freq: Three times a day (TID) | ORAL | Status: DC | PRN

## 2018-01-16 MED ORDER — PANTOPRAZOLE SODIUM 40 MG PO TBEC
40.0000 mg | DELAYED_RELEASE_TABLET | Freq: Every day | ORAL | Status: DC
  Administered 2018-01-17 – 2018-01-19 (×3): 40 mg via ORAL
  Filled 2018-01-16 (×3): qty 1

## 2018-01-16 MED ORDER — LORAZEPAM 0.5 MG PO TABS
0.5000 mg | ORAL_TABLET | Freq: Three times a day (TID) | ORAL | Status: DC | PRN
  Administered 2018-01-17 – 2018-01-18 (×3): 0.5 mg via ORAL
  Filled 2018-01-16 (×3): qty 1

## 2018-01-16 MED ORDER — ACETAMINOPHEN 650 MG RE SUPP: 650.0000 mg | Freq: Four times a day (QID) | RECTAL | Status: DC | PRN

## 2018-01-16 MED ORDER — VITAMIN D 1000 UNITS PO TABS
5000.0000 [IU] | ORAL_TABLET | Freq: Every day | ORAL | Status: DC
  Administered 2018-01-17 – 2018-01-19 (×3): 5000 [IU] via ORAL
  Filled 2018-01-16 (×9): qty 5

## 2018-01-16 NOTE — ED Notes (Signed)
Date and time results received: 01/16/18 2141 (use smartphrase ".now" to insert current time)  Test: Troponin Critical Value: 0.07  Name of Provider Notified: Lacinda Axon  Orders Received? Or Actions Taken?:

## 2018-01-16 NOTE — ED Triage Notes (Signed)
Pt reports increased SOB and non-productive cough x 2 weeks. Pt saw PCP on Monday and prescribed prednisone. States symptoms are worse.

## 2018-01-16 NOTE — ED Provider Notes (Signed)
[facility] EMERGENCY DEPARTMENT Provider Note   CSN: [id number] Arrival date & time: 01/16/18  1821     History   Chief Complaint Chief Complaint  Patient presents with  . Shortness of Breath    HPI Lori Cooper is a 62 y.o. female.  Dyspnea for 1 week, getting worse.  Patient was seen by her primary care doctor this past week and prescribed prednisone and doxycycline.  Chest x-ray at that time was negative.  No substernal chest pain, productive cough, fever, sweats, chills.  No prolonged immobilization, recent surgery, coagulation disorder, cancer diagnosis.  Patient gets winded walking a very short distance.  Severity of symptoms is moderate to severe.  Review of systems positive for bilateral posterior thigh pain initially in the right side and now in the left side      Past Medical History:  Diagnosis Date  . Anxiety   . Chronic back pain   . DDD (degenerative disc disease), cervical   . DDD (degenerative disc disease), lumbosacral   . Depression   . GERD (gastroesophageal reflux disease)   . Hiatal hernia   . History of adenomatous polyp of colon    2009  tubular adenoma  . History of esophagitis   . History of idiopathic seizure    1984  x1 after vaginal delivery (per pt negative work-up and no issue since)  . History of left shoulder fracture    01/ 2014  proximal humerus fx  . Hyperlipidemia   . OA (osteoarthritis)    knees and thumbs  . RLS (restless legs syndrome)   . Supraumbilical hernia   . Umbilical hernia   . Wears dentures    lower    Patient Active Problem List   Diagnosis Date Noted  . Metabolic syndrome 46/50/3546  . Arthritis 07/16/2015  . Depression 12/21/2014  . Supraumbilical hernia  56/81/2751  . Incisional hernias x 3 s/p laparoscopic repair with mesh 12/31/2016 04/02/2014  . Personal history of colonic polyps 04/02/2014  . Morbid obesity ([place]) 02/13/2014  . Prediabetes 09/21/2013  . Vitamin D deficiency 06/23/2013  . HLD  (hyperlipidemia) 02/20/2013  . GERD (gastroesophageal reflux disease) 02/20/2013  . Generalized anxiety disorder 02/20/2013  . Restless legs 02/20/2013    Past Surgical History:  Procedure Laterality Date  . CARDIOVASCULAR STRESS TEST  05/06/2010   Lexiscan nuclear study w/ no exercise/  probably normal , per images there is a large reversible defect in the mid to distal anterior wall, this seems to be shifting breast attenuation/  normal LV function and wall motion , ef 67%  . CARPAL TUNNEL RELEASE Bilateral 2011   excision ganglion cyst left wrist  . CATARACT EXTRACTION W/ INTRAOCULAR LENS  IMPLANT, BILATERAL  09 and 10/ 2017  . COLONOSCOPY  05/08/2008  . LAPAROSCOPIC CHOLECYSTECTOMY  2012  . SHOULDER ARTHROSCOPY/  ACROMIOPLASTY/  DISTAL CLAVICAL RESECTION/  DEBRIDEMENT LABRAL TEAR Left 02/09/2005  . TONSILLECTOMY AND ADENOIDECTOMY  age 77  . TUBAL LIGATION Bilateral yrs ago  . VENTRAL HERNIA REPAIR N/A 12/31/2016   Procedure: LAPAROSCOPIC VENTRAL WALL HERNIA REPAIR WITH ERAS PATHWAY;  Surgeon: Michael Boston, MD;  Location: [place];  Service: General;  Laterality: N/A;    OB History    No data available       Home Medications    Prior to Admission medications   Medication Sig Start Date End Date Taking? Authorizing Provider  albuterol (PROVENTIL HFA;VENTOLIN HFA) 108 (90 Base) MCG/ACT inhaler Inhale 2 puffs into  the lungs every 6 (six) hours as needed for wheezing or shortness of breath. 02/19/17   Evelina Dun A, FNP  aspirin 81 MG tablet Take 81 mg by mouth every other day.    [provider]  atorvastatin (LIPITOR) 40 MG tablet TAKE 1 TABLET BY MOUTH ONCE DAILY 11/25/17   Evelina Dun A, FNP  buPROPion (WELLBUTRIN XL) 150 MG 24 hr tablet TAKE 1 TABLET BY MOUTH ONCE DAILY 12/15/17   Evelina Dun A, FNP  Cholecalciferol (VITAMIN D3) 5000 units CAPS Take 1 capsule by mouth daily.    [provider]  cyclobenzaprine (FLEXERIL) 10 MG tablet  TAKE 1 TABLET BY MOUTH THREE TIMES DAILY AS NEEDED FOR MUSCLE SPASM 01/03/18   Dettinger, Fransisca Kaufmann, MD  dexlansoprazole (DEXILANT) 60 MG capsule Take 1 capsule (60 mg total) daily by mouth. 09/24/17   Sharion Balloon, FNP  doxycycline (VIBRA-TABS) 100 MG tablet Take 1 tablet (100 mg total) by mouth 2 (two) times daily. 01/10/18   Sharion Balloon, FNP  FLUoxetine (PROZAC) 40 MG capsule TAKE 2 CAPSULES BY MOUTH ONCE DAILY 10/15/17   Evelina Dun A, FNP  HYDROcodone-acetaminophen [facility]) 10-325 MG tablet  08/05/17   Suella Broad, MD  LORazepam (ATIVAN) 0.5 MG tablet TAKE 1 TABLET BY MOUTH EVERY 8 HOURS AS NEEDED 10/27/17   Sharion Balloon, FNP  predniSONE (STERAPRED UNI-PAK 21 TAB) 10 MG (21) TBPK tablet Use as directed 01/10/18   Sharion Balloon, FNP    Family History Family History  Problem Relation Age of Onset  . Hypertension Mother   . Kidney disease Mother   . Diabetes Mother   . Obesity Sister     Social History Social History   Tobacco Use  . Smoking status: Former Smoker    Packs/day: 0.50    Years: 40.00    Pack years: 20.00    Types: Cigarettes    Start date: 02/20/1973    Last attempt to quit: 06/16/2013    Years since quitting: 4.5  . Smokeless tobacco: Never Used  Substance Use Topics  . Alcohol use: No  . Drug use: No     Allergies   Patient has no known allergies.   Review of Systems Review of Systems  All other systems reviewed and are negative.    Physical Exam Updated Vital Signs BP (!) 105/52   Pulse 90   Temp 98.1 F (36.7 C) (Oral)   Resp (!) 21   Ht 5\' 5"  (1.651 m)   Wt 104.8 kg (231 lb)   SpO2 90%   BMI 38.44 kg/m   Physical Exam  Constitutional: She is oriented to person, place, and time.  Elevated BMI, no acute distress  HENT:  Head: Normocephalic and atraumatic.  Eyes: Conjunctivae are normal.  Neck: Neck supple.  Cardiovascular: Normal rate and regular rhythm.  Pulmonary/Chest: Effort normal and breath sounds normal.    Abdominal: Soft. Bowel sounds are normal.  Musculoskeletal: Normal range of motion.  Minimal tenderness in the posterior thighs bilaterally left greater than right  Neurological: She is alert and oriented to person, place, and time.  Skin: Skin is warm and dry.  Psychiatric: She has a normal mood and affect. Her behavior is normal.  Nursing note and vitals reviewed.    ED Treatments / Results  Labs (all labs ordered are listed, but only abnormal results are displayed) Labs Reviewed  CBC WITH DIFFERENTIAL/PLATELET - Abnormal; Notable for the following components:      Result  Value   WBC 13.5 (*)    Lymphs Abs 6.4 (*)    All other components within normal limits  COMPREHENSIVE METABOLIC PANEL - Abnormal; Notable for the following components:   Glucose, Bld 129 (*)    BUN 25 (*)    Creatinine, Ser 1.02 (*)    GFR calc non Af Amer 57 (*)    All other components within normal limits  TROPONIN I - Abnormal; Notable for the following components:   Troponin I 0.07 (*)    All other components within normal limits  D-DIMER, QUANTITATIVE (NOT AT [facility]) - Abnormal; Notable for the following components:   D-Dimer, Quant 2.15 (*)    All other components within normal limits    EKG  EKG Interpretation  Date/Time:  Sunday January 16 2018 18:32:33 EST Ventricular Rate:  91 PR Interval:  146 QRS Duration: 76 QT Interval:  392 QTC Calculation: 482 R Axis:   71 Text Interpretation:  Normal sinus rhythm Anterior infarct , age undetermined Abnormal ECG Confirmed by Nat Christen [phone]) on 01/16/2018 7:45:53 PM       Radiology Dg Chest 2 View  Result Date: 01/16/2018 CLINICAL DATA:  Shortness of breath.  Nonproductive cough. EXAM: CHEST  2 VIEW COMPARISON:  None. FINDINGS: Small hiatal hernia. The heart, hila, mediastinum, lungs, and pleura are normal. IMPRESSION: Small hiatal hernia.  No acute abnormalities. Electronically Signed   By: Dorise Bullion III M.D   On: 01/16/2018 19:42   Ct  Angio Chest Pe W And/or Wo Contrast  Result Date: 01/16/2018 CLINICAL DATA:  Shortness of breath, productive cough. EXAM: CT ANGIOGRAPHY CHEST WITH CONTRAST TECHNIQUE: Multidetector CT imaging of the chest was performed using the standard protocol during bolus administration of intravenous contrast. Multiplanar CT image reconstructions and MIPs were obtained to evaluate the vascular anatomy. CONTRAST:  113mL ISOVUE-370 IOPAMIDOL (ISOVUE-370) INJECTION 76% COMPARISON:  None. FINDINGS: Cardiovascular: Large filling defects are seen in the lower lobe branches of both pulmonary arteries consistent with acute pulmonary emboli. Atherosclerosis of thoracic aorta is noted without aneurysm or dissection. Normal cardiac size. No pericardial effusion. RV/LV ratio of 1.25 is measured which is abnormal. Mediastinum/Nodes: Moderate size sliding-type hiatal hernia is noted. Thyroid gland is unremarkable. No adenopathy is noted. Lungs/Pleura: Lungs are clear. No pleural effusion or pneumothorax. Upper Abdomen: No acute abnormality. Musculoskeletal: No chest wall abnormality. No acute or significant osseous findings. Review of the MIP images confirms the above findings. IMPRESSION: Large bilateral pulmonary emboli are noted. Positive for acute PE with CT evidence of right heart strain (RV/LV Ratio = 1.25) consistent with at least submassive (intermediate risk) PE. The presence of right heart strain has been associated with an increased risk of morbidity and mortality. Please activate Code PE by paging [phone]. Critical Value/emergent results were called by telephone at the time of interpretation on 01/16/2018 at 9:42 pm to Dr. Nat Christen , who verbally acknowledged these results. Moderate size sliding-type hiatal hernia is noted. Electronically Signed   By: Marijo Conception, M.D.   On: 01/16/2018 21:43    Procedures Procedures (including critical care time)  Medications Ordered in ED Medications  heparin injection 4,000  Units (not administered)  heparin ADULT infusion 100 units/mL (25000 units/234mL sodium chloride 0.45%) (not administered)  albuterol (PROVENTIL) (2.5 MG/3ML) 0.083% nebulizer solution 5 mg (5 mg Nebulization Given 01/16/18 1948)  iopamidol (ISOVUE-370) 76 % injection 100 mL (100 mLs Intravenous Contrast Given 01/16/18 2108)     Initial Impression / Assessment and  Plan / ED Course  I have reviewed the triage vital signs and the nursing notes.  Pertinent labs & imaging results that were available during my care of the patient were reviewed by me and considered in my medical decision making (see chart for details).     Patient presents with dyspnea for 1 week and posterior thigh pain.  She was treated for a URI, but she had no typical symptoms for this.  I obtained a CT angiogram of her chest to rule out a pulmonary embolism.  She does have bilateral PEs.  She is hemodynamically stable.  Will start heparin IV.  She will need lower extremity Doppler studies in the morning.  Discussed with general medicine.  Admit.   CRITICAL CARE Performed by: Nat Christen Total critical care time: 30 minutes Critical care time was exclusive of separately billable procedures and treating other patients. Critical care was necessary to treat or prevent imminent or life-threatening deterioration. Critical care was time spent personally by me on the following activities: development of treatment plan with patient and/or surrogate as well as nursing, discussions with consultants, evaluation of patient's response to treatment, examination of patient, obtaining history from patient or surrogate, ordering and performing treatments and interventions, ordering and review of laboratory studies, ordering and review of radiographic studies, pulse oximetry and re-evaluation of patient's condition.  Final Clinical Impressions(s) / ED Diagnoses   Final diagnoses:  Bilateral pulmonary embolism [facility])    ED Discharge Orders     None       Nat Christen, MD 01/16/18 2159

## 2018-01-16 NOTE — H&P (Addendum)
History and Physical    Lori Cooper AVW:[id number] DOB: 08-Jun-1956 DOA: 01/16/2018  PCP: Junie Spencer, FNP   Patient coming from: Home  Chief Complaint: Dyspnea x 1 week  HPI: Lori Cooper is a 62 y.o. female with medical history significant for anxiety/depression, arthritis, GERD, dyslipidemia, and restless leg syndrome who has developed worsening shortness of breath over the last 1 week.  She went to see her primary care provider this past Monday and was given a prednisone taper as well as some doxycycline and chest x-ray findings at that time did not demonstrate any acute abnormalities.  Despite taking these medications, she continued to have ongoing shortness of breath, particularly with exertion and she noted some lightheadedness with it as well.  She states that she has also had some right-sided thigh pain as well as some on the left side that developed a little later.  She denies any significant swelling to her legs.  She denies any recent immobilization, travel, or surgeries and states that her last major surgery was abdominal mesh placement about a year ago as well as some dental surgery in September 2018. She denies chest pain or palpitations. She currently denies any dyspnea, cough, sputum production, fever, or chills.   ED Course: Vital signs are noted to be stable with some soft blood pressure readings, but systolic blood pressure remains in the low 100 range.  EKG demonstrates sinus rhythm and she is currently approximately 90-100 bpm on telemetry.  Troponin is mildly elevated at 0.07.  CT of the chest with contrast demonstrates large bilateral PE that is characterized as submassive with RV strain noted.  Patient was actually saturating 93% on room air with no respiratory distress and has just now been placed on 2 L nasal cannula.  Heparin drip has been ordered.  Review of Systems: All others reviewed and otherwise negative.  Past Medical History:  Diagnosis Date  . Anxiety   .  Chronic back pain   . DDD (degenerative disc disease), cervical   . DDD (degenerative disc disease), lumbosacral   . Depression   . GERD (gastroesophageal reflux disease)   . Hiatal hernia   . History of adenomatous polyp of colon    2009  tubular adenoma  . History of esophagitis   . History of idiopathic seizure    1984  x1 after vaginal delivery (per pt negative work-up and no issue since)  . History of left shoulder fracture    01/ 2014  proximal humerus fx  . Hyperlipidemia   . OA (osteoarthritis)    knees and thumbs  . RLS (restless legs syndrome)   . Supraumbilical hernia   . Umbilical hernia   . Wears dentures    lower    Past Surgical History:  Procedure Laterality Date  . CARDIOVASCULAR STRESS TEST  05/06/2010   Lexiscan nuclear study w/ no exercise/  probably normal , per images there is a large reversible defect in the mid to distal anterior wall, this seems to be shifting breast attenuation/  normal LV function and wall motion , ef 67%  . CARPAL TUNNEL RELEASE Bilateral 2011   excision ganglion cyst left wrist  . CATARACT EXTRACTION W/ INTRAOCULAR LENS  IMPLANT, BILATERAL  09 and 10/ 2017  . COLONOSCOPY  05/08/2008  . LAPAROSCOPIC CHOLECYSTECTOMY  2012  . SHOULDER ARTHROSCOPY/  ACROMIOPLASTY/  DISTAL CLAVICAL RESECTION/  DEBRIDEMENT LABRAL TEAR Left 02/09/2005  . TONSILLECTOMY AND ADENOIDECTOMY  age 78  . TUBAL LIGATION Bilateral yrs ago  .  VENTRAL HERNIA REPAIR N/A 12/31/2016   Procedure: LAPAROSCOPIC VENTRAL WALL HERNIA REPAIR WITH ERAS PATHWAY;  Surgeon: Karie Soda, MD;  Location: [facility];  Service: General;  Laterality: N/A;     reports that she quit smoking about 4 years ago. Her smoking use included cigarettes. She started smoking about 44 years ago. She has a 20.00 pack-year smoking history. she has never used smokeless tobacco. She reports that she does not drink alcohol or use drugs.  No Known Allergies  Family History  Problem  Relation Age of Onset  . Hypertension Mother   . Kidney disease Mother   . Diabetes Mother   . Obesity Sister     Prior to Admission medications   Medication Sig Start Date End Date Taking? Authorizing Provider  albuterol (PROVENTIL HFA;VENTOLIN HFA) 108 (90 Base) MCG/ACT inhaler Inhale 2 puffs into the lungs every 6 (six) hours as needed for wheezing or shortness of breath. 02/19/17   Jannifer Rodney A, FNP  aspirin 81 MG tablet Take 81 mg by mouth every other day.    [provider]  atorvastatin (LIPITOR) 40 MG tablet TAKE 1 TABLET BY MOUTH ONCE DAILY 11/25/17   Jannifer Rodney A, FNP  buPROPion (WELLBUTRIN XL) 150 MG 24 hr tablet TAKE 1 TABLET BY MOUTH ONCE DAILY 12/15/17   Jannifer Rodney A, FNP  Cholecalciferol (VITAMIN D3) 5000 units CAPS Take 1 capsule by mouth daily.    [provider]  cyclobenzaprine (FLEXERIL) 10 MG tablet TAKE 1 TABLET BY MOUTH THREE TIMES DAILY AS NEEDED FOR MUSCLE SPASM 01/03/18   Dettinger, Elige Radon, MD  dexlansoprazole (DEXILANT) 60 MG capsule Take 1 capsule (60 mg total) daily by mouth. 09/24/17   Junie Spencer, FNP  doxycycline (VIBRA-TABS) 100 MG tablet Take 1 tablet (100 mg total) by mouth 2 (two) times daily. 01/10/18   Junie Spencer, FNP  FLUoxetine (PROZAC) 40 MG capsule TAKE 2 CAPSULES BY MOUTH ONCE DAILY 10/15/17   Junie Spencer, FNP  HYDROcodone-acetaminophen [facility]) 10-325 MG tablet  08/05/17   Sheran Luz, MD  LORazepam (ATIVAN) 0.5 MG tablet TAKE 1 TABLET BY MOUTH EVERY 8 HOURS AS NEEDED 10/27/17   Junie Spencer, FNP  predniSONE (STERAPRED UNI-PAK 21 TAB) 10 MG (21) TBPK tablet Use as directed 01/10/18   Junie Spencer, FNP    Physical Exam: Vitals:   01/16/18 1828 01/16/18 1947 01/16/18 2000  BP: (!) 126/57  (!) 105/52  Pulse: 96  90  Resp: 20  (!) 21  Temp: 98.1 F (36.7 C)    TempSrc: Oral    SpO2: 97% 100% 90%  Weight: 104.8 kg (231 lb)    Height: 5\' 5"  (1.651 m)      Constitutional: NAD, calm,  comfortable Vitals:   01/16/18 1828 01/16/18 1947 01/16/18 2000  BP: (!) 126/57  (!) 105/52  Pulse: 96  90  Resp: 20  (!) 21  Temp: 98.1 F (36.7 C)    TempSrc: Oral    SpO2: 97% 100% 90%  Weight: 104.8 kg (231 lb)    Height: 5\' 5"  (1.651 m)     Eyes: lids and conjunctivae normal ENMT: Mucous membranes are moist.  Neck: normal, supple Respiratory: clear to auscultation bilaterally. Normal respiratory effort. No accessory muscle use. On 2L [place]. Cardiovascular: Regular rate and rhythm, no murmurs. No extremity edema. Abdomen: no tenderness, no distention. Bowel sounds positive.  Musculoskeletal:  No joint deformity upper and lower extremities.   Skin: no rashes,  lesions, ulcers.  Psychiatric: Normal judgment and insight. Alert and oriented x 3. Normal mood.   Labs on Admission: I have personally reviewed following labs and imaging studies  CBC: Recent Labs  Lab 01/10/18 1504 01/16/18 2029  WBC 8.1 13.5*  NEUTROABS 4.7 5.9  HGB 13.3 14.4  HCT 40.1 45.4  MCV 86 90.8  PLT 349 325   Basic Metabolic Panel: Recent Labs  Lab 01/10/18 1504 01/16/18 2029  NA 142 143  K 4.7 4.0  CL 103 105  CO2 23 24  GLUCOSE 114* 129*  BUN 8 25*  CREATININE 0.84 1.02*  CALCIUM 9.3 9.0   GFR: Estimated Creatinine Clearance: 67.8 mL/min (A) (by C-G formula based on SCr of 1.02 mg/dL (H)). Liver Function Tests: Recent Labs  Lab 01/16/18 2029  AST 26  ALT 29  ALKPHOS 115  BILITOT 0.6  PROT 6.8  ALBUMIN 3.7   No results for input(s): LIPASE, AMYLASE in the last 168 hours. No results for input(s): AMMONIA in the last 168 hours. Coagulation Profile: No results for input(s): INR, PROTIME in the last 168 hours. Cardiac Enzymes: Recent Labs  Lab 01/16/18 2029  TROPONINI 0.07*   BNP (last 3 results) No results for input(s): PROBNP in the last 8760 hours. HbA1C: No results for input(s): HGBA1C in the last 72 hours. CBG: No results for input(s): GLUCAP in the last 168  hours. Lipid Profile: No results for input(s): CHOL, HDL, LDLCALC, TRIG, CHOLHDL, LDLDIRECT in the last 72 hours. Thyroid Function Tests: No results for input(s): TSH, T4TOTAL, FREET4, T3FREE, THYROIDAB in the last 72 hours. Anemia Panel: No results for input(s): VITAMINB12, FOLATE, FERRITIN, TIBC, IRON, RETICCTPCT in the last 72 hours. Urine analysis:    Component Value Date/Time   APPEARANCEUR Clear 03/23/2017 1006   GLUCOSEU Negative 03/23/2017 1006   BILIRUBINUR Negative 03/23/2017 1006   PROTEINUR 1+ (A) 03/23/2017 1006   NITRITE Negative 03/23/2017 1006   LEUKOCYTESUR 1+ (A) 03/23/2017 1006    Radiological Exams on Admission: Dg Chest 2 View  Result Date: 01/16/2018 CLINICAL DATA:  Shortness of breath.  Nonproductive cough. EXAM: CHEST  2 VIEW COMPARISON:  None. FINDINGS: Small hiatal hernia. The heart, hila, mediastinum, lungs, and pleura are normal. IMPRESSION: Small hiatal hernia.  No acute abnormalities. Electronically Signed   By: Gerome Sam III M.D   On: 01/16/2018 19:42   Ct Angio Chest Pe W And/or Wo Contrast  Result Date: 01/16/2018 CLINICAL DATA:  Shortness of breath, productive cough. EXAM: CT ANGIOGRAPHY CHEST WITH CONTRAST TECHNIQUE: Multidetector CT imaging of the chest was performed using the standard protocol during bolus administration of intravenous contrast. Multiplanar CT image reconstructions and MIPs were obtained to evaluate the vascular anatomy. CONTRAST:  ISOVUE-370 IOPAMIDOL (ISOVUE-370) INJECTION 76% COMPARISON:  None. FINDINGS: Cardiovascular: Large filling defects are seen in the lower lobe branches of both pulmonary arteries consistent with acute pulmonary emboli. Atherosclerosis of thoracic aorta is noted without aneurysm or dissection. Normal cardiac size. No pericardial effusion. RV/LV ratio of 1.25 is measured which is abnormal. Mediastinum/Nodes: Moderate size sliding-type hiatal hernia is noted. Thyroid gland is unremarkable. No adenopathy  is noted. Lungs/Pleura: Lungs are clear. No pleural effusion or pneumothorax. Upper Abdomen: No acute abnormality. Musculoskeletal: No chest wall abnormality. No acute or significant osseous findings. Review of the MIP images confirms the above findings. IMPRESSION: Large bilateral pulmonary emboli are noted. Positive for acute PE with CT evidence of right heart strain (RV/LV Ratio = 1.25) consistent with at least submassive (  intermediate risk) PE. The presence of right heart strain has been associated with an increased risk of morbidity and mortality. Please activate Code PE by paging [phone]. Critical Value/emergent results were called by telephone at the time of interpretation on 01/16/2018 at 9:42 pm to Dr. Donnetta Hutching , who verbally acknowledged these results. Moderate size sliding-type hiatal hernia is noted. Electronically Signed   By: Lupita Raider, M.D.   On: 01/16/2018 21:43    EKG: Independently reviewed. NSR.  Assessment/Plan Principal Problem:   Pulmonary embolism with acute cor pulmonale (HCC) Active Problems:   HLD (hyperlipidemia)   GERD (gastroesophageal reflux disease)   Generalized anxiety disorder   Restless legs   Depression    1. Acute submassive bilateral pulmonary emboli-unprovoked.  Admit to stepdown unit for close monitoring as patient may become hemodynamically unstable.  Continue with heparin drip for now.  2D echocardiogram to further evaluate RV function.  Doppler ultrasound of the bilateral lower extremities to evaluate for DVT.  I do not feel that thrombolytic treatment is currently necessary as she is hemodynamic stable and very minimally symptomatic.  May consider hypercoagulable panel to further assess risk factors; but there is apparently no significant family history of blood clots. Gentle IVF and OSV in AM. 2. Elevated troponin.  Continue to monitor with repeats.  Check 2D echocardiogram as noted. 3. Dyslipidemia.  Hold statin for now and check CK level.   May resume if this is within normal limits. 4. Depression/anxiety.  Continue home medications. 5. GERD.  Continue home medications.   DVT prophylaxis: Heparin drip for PE treatment Code Status: Full Family Communication: Husband not currently at bedside Disposition Plan:TBD Consults called:None Admission status: Inpatient, SDU   Eliyah Mcshea Hoover Brunette DO [facility] Pager [phone]  If 7PM-7AM, please contact night-coverage [email or web address] Password [facility]  01/16/2018, 10:15 PM

## 2018-01-16 NOTE — Progress Notes (Signed)
ANTICOAGULATION CONSULT NOTE - Initial Consult  Pharmacy Consult for heparin Indication: pulmonary embolus  No Known Allergies  Patient Measurements: Height: 5\' 5"  (165.1 cm) Weight: 231 lb (104.8 kg) IBW/kg (Calculated) : 57 Heparin Dosing Weight: 81.3 kg  Vital Signs: Temp: 98.1 F (36.7 C) (03/03 1828) Temp Source: Oral (03/03 1828) BP: 105/52 (03/03 2000) Pulse Rate: 90 (03/03 2000)  Labs: Recent Labs    01/16/18 2029  HGB 14.4  HCT 45.4  PLT 325  CREATININE 1.02*  TROPONINI 0.07*    Estimated Creatinine Clearance: 67.8 mL/min (A) (by C-G formula based on SCr of 1.02 mg/dL (H)).   Medical History: Past Medical History:  Diagnosis Date  . Anxiety   . Chronic back pain   . DDD (degenerative disc disease), cervical   . DDD (degenerative disc disease), lumbosacral   . Depression   . GERD (gastroesophageal reflux disease)   . Hiatal hernia   . History of adenomatous polyp of colon    2009  tubular adenoma  . History of esophagitis   . History of idiopathic seizure    1984  x1 after vaginal delivery (per pt negative work-up and no issue since)  . History of left shoulder fracture    01/ 2014  proximal humerus fx  . Hyperlipidemia   . OA (osteoarthritis)    knees and thumbs  . RLS (restless legs syndrome)   . Supraumbilical hernia   . Umbilical hernia   . Wears dentures    lower    Medications:  See medication history  Assessment: 62 yo lady to start heparin for PE.  CT shows right heart strain.  She was not on anticoagulation PTA Goal of Therapy:  Heparin level 0.3-0.7 units/ml Monitor platelets by anticoagulation protocol: Yes   Plan:  Heparin 4000 unit bolus ordered in ED Heparin drip at 1450 units/hr Check heparin level daily while on heparin. Daily CBC Monitor for bleeding complications  Kylei Purington Poteet 01/16/2018,9:59 PM

## 2018-01-17 ENCOUNTER — Inpatient Hospital Stay (HOSPITAL_COMMUNITY): Payer: PPO

## 2018-01-17 DIAGNOSIS — E782 Mixed hyperlipidemia: Secondary | ICD-10-CM

## 2018-01-17 DIAGNOSIS — I5030 Unspecified diastolic (congestive) heart failure: Secondary | ICD-10-CM

## 2018-01-17 DIAGNOSIS — K219 Gastro-esophageal reflux disease without esophagitis: Secondary | ICD-10-CM

## 2018-01-17 LAB — BASIC METABOLIC PANEL
Anion gap: 11 (ref 5–15)
BUN: 19 mg/dL (ref 6–20)
CO2: 22 mmol/L (ref 22–32)
Calcium: 8.7 mg/dL — ABNORMAL LOW (ref 8.9–10.3)
Chloride: 105 mmol/L (ref 101–111)
Creatinine, Ser: 0.91 mg/dL (ref 0.44–1.00)
GFR calc Af Amer: >60 mL/min (ref >60)
GFR calc non Af Amer: >60 mL/min (ref >60)
Glucose, Bld: 117 mg/dL — ABNORMAL HIGH (ref 65–99)
Potassium: 3.7 mmol/L (ref 3.5–5.1)
Sodium: 138 mmol/L (ref 135–145)

## 2018-01-17 LAB — HEPARIN LEVEL (UNFRACTIONATED)
Heparin Unfractionated: 1.14 IU/mL — ABNORMAL HIGH (ref 0.30–0.70)
Heparin Unfractionated: 0.54 IU/mL (ref 0.30–0.70)

## 2018-01-17 LAB — TROPONIN I
Troponin I: 0.07 ng/mL (ref <0.03)
Troponin I: 0.07 ng/mL (ref <0.03)

## 2018-01-17 LAB — CBC
HCT: 40.9 % (ref 36.0–46.0)
Hemoglobin: 13 g/dL (ref 12.0–15.0)
MCH: 28.3 pg (ref 26.0–34.0)
MCHC: 31.8 g/dL (ref 30.0–36.0)
MCV: 88.9 fL (ref 78.0–100.0)
Platelets: 323 10*3/uL (ref 150–400)
RBC: 4.6 MIL/uL (ref 3.87–5.11)
RDW: 14.2 % (ref 11.5–15.5)
WBC: 13.3 10*3/uL — ABNORMAL HIGH (ref 4.0–10.5)

## 2018-01-17 LAB — MRSA PCR SCREENING: MRSA by PCR: NEGATIVE

## 2018-01-17 MED ORDER — HEPARIN (PORCINE) IN NACL 100-0.45 UNIT/ML-% IJ SOLN
1100.0000 [IU]/h | INTRAMUSCULAR | Status: AC
  Administered 2018-01-17: 1150 [IU]/h via INTRAVENOUS
  Administered 2018-01-18: 1100 [IU]/h via INTRAVENOUS
  Filled 2018-01-17 (×2): qty 250

## 2018-01-17 NOTE — Progress Notes (Signed)
Pt having echo done. Water given per pt request. No other needs expressed at this time.

## 2018-01-17 NOTE — Progress Notes (Addendum)
PROGRESS NOTE  Lori Cooper FWY:[id number] DOB: [birth year] DOA: 01/16/2018 PCP: Sharion Balloon, FNP  Brief History:  62 year old female with a history of hyperlipidemia, restless leg syndrome, anxiety/depression, hyperlipidemia, GERD presented with 1 week history of shortness of breath.  The patient went to see her primary care provider on 01/10/2018.  Apparently, his chest x-ray was negative at that time.  The patient was given a prescription for doxycycline and prednisone taper.  There is no improvement in her shortness of breath.  She continued to have dyspnea on exertion and began to have some lightheadedness.  She also had some right sided thigh pain.  As a result, she presented for further evaluation.  She denies any recent surgeries, new medications, immobilization, traveling.  She states that  her last major surgery was abdominal mesh placement about a year ago as well as some dental surgery in September 2018.  In the emergency department, the patient was afebrile hemodynamically stable.  She had a troponin of 0.07.  EKG showed sinus rhythm with nonspecific T wave changes.  CT angiogram chest submassive bilateral lower lobe pulmonary emboli--with RV strain.    Assessment/Plan: Acute submassive PE -Unprovoked by clinical history -Echocardiogram -Continue IV heparin -She is currently hemodynamically stable without hypoxemia -Check prothrombin gene mutation, lupus anticoagulant, factor V Leiden -PESI Class III--intermediate risk of complications/mortality -am CBC/BMP  Elevated troponin -Secondary to pulmonary embolus -Trend is flat -Echocardiogram -Personally reviewed EKG--sinus rhythm, nonspecific T wave change  -Personally reviewed chest x-ray--no edema or consolidation  Hyperlipidemia -Restart statin -CPK 31  Depression/Anxiety -continue Wellbutrin and fluoxetine  GERD -Continue PPI   Disposition Plan:   Home in 2-3 days  Family Communication:  Spouse updated  at bedside--3/4--Total time spent 35 minutes.  Greater than 50% spent face to face counseling and coordinating care.   Consultants:  none  Code Status:  FULL  DVT Prophylaxis:  IV heparin   Procedures: As Listed in Progress Note Above  Antibiotics: None    Subjective: Patient denies fevers, chills, headache, chest pain, dyspnea, nausea, vomiting, diarrhea, abdominal pain, dysuria, hematuria, hematochezia, and melena.   Objective: Vitals:   01/17/18 0700 01/17/18 0800 01/17/18 0900 01/17/18 1000  BP: (!) 98/57 96/61 (!) 111/50 (!) 116/49  Pulse: 85 83 84 85  Resp: 14 17 18 19   Temp:      TempSrc:      SpO2: 95% 94% 96% 94%  Weight:      Height:        Intake/Output Summary (Last 24 hours) at 01/17/2018 1111 Last data filed at 01/17/2018 0400 Gross per 24 hour  Intake 415 ml  Output 400 ml  Net 15 ml   Weight change:  Exam:   General:  Pt is alert, follows commands appropriately, not in acute distress  HEENT: No icterus, No thrush, No neck mass, [place]/AT  Cardiovascular: RRR, S1/S2, no rubs, no gallops  Respiratory: CTA bilaterally, no wheezing, no crackles, no rhonchi  Abdomen: Soft/+BS, non tender, non distended, no guarding  Extremities: No edema, No lymphangitis, No petechiae, No rashes, no synovitis   Data Reviewed: I have personally reviewed following labs and imaging studies Basic Metabolic Panel: Recent Labs  Lab 01/10/18 1504 01/16/18 2029 01/17/18 0432  NA 142 143 138  K 4.7 4.0 3.7  CL 103 105 105  CO2 23 24 22   GLUCOSE 114* 129* 117*  BUN 8 25* 19  CREATININE 0.84 1.02* 0.91  CALCIUM 9.3  9.0 8.7*   Liver Function Tests: Recent Labs  Lab 01/16/18 2029  AST 26  ALT 29  ALKPHOS 115  BILITOT 0.6  PROT 6.8  ALBUMIN 3.7   No results for input(s): LIPASE, AMYLASE in the last 168 hours. No results for input(s): AMMONIA in the last 168 hours. Coagulation Profile: No results for input(s): INR, PROTIME in the last 168  hours. CBC: Recent Labs  Lab 01/10/18 1504 01/16/18 2029 01/17/18 0432  WBC 8.1 13.5* 13.3*  NEUTROABS 4.7 5.9  --   HGB 13.3 14.4 13.0  HCT 40.1 45.4 40.9  MCV 86 90.8 88.9  PLT 349 325 323   Cardiac Enzymes: Recent Labs  Lab 01/16/18 2029 01/16/18 2252 01/17/18 0432  CKTOTAL  --  31*  --   TROPONINI 0.07* 0.08* 0.07*   BNP: Invalid input(s): POCBNP CBG: No results for input(s): GLUCAP in the last 168 hours. HbA1C: No results for input(s): HGBA1C in the last 72 hours. Urine analysis:    Component Value Date/Time   APPEARANCEUR Clear 03/23/2017 1006   GLUCOSEU Negative 03/23/2017 1006   BILIRUBINUR Negative 03/23/2017 1006   PROTEINUR 1+ (A) 03/23/2017 1006   NITRITE Negative 03/23/2017 1006   LEUKOCYTESUR 1+ (A) 03/23/2017 1006   Sepsis Labs: @LABRCNTIP (procalcitonin:4,lacticidven:4) ) Recent Results (from the past 240 hour(s))  MRSA PCR Screening     Status: None   Collection Time: 01/17/18  1:20 AM  Result Value Ref Range Status   MRSA by PCR NEGATIVE NEGATIVE Final    Comment:        The GeneXpert MRSA Assay (FDA approved for NASAL specimens only), is one component of a comprehensive MRSA colonization surveillance program. It is not intended to diagnose MRSA infection nor to guide or monitor treatment for MRSA infections. Performed at [facility], [address], [place], [place] [address]      Scheduled Meds: . aspirin EC  81 mg Oral QODAY  . buPROPion  150 mg Oral Daily  . cholecalciferol  5,000 Units Oral Daily  . FLUoxetine  80 mg Oral Daily  . pantoprazole  40 mg Oral Daily   Continuous Infusions: . heparin 1,150 Units/hr (01/17/18 0831)    Procedures/Studies: Dg Chest 2 View  Result Date: 01/16/2018 CLINICAL DATA:  Shortness of breath.  Nonproductive cough. EXAM: CHEST  2 VIEW COMPARISON:  None. FINDINGS: Small hiatal hernia. The heart, hila, mediastinum, lungs, and pleura are normal. IMPRESSION: Small hiatal hernia.  No acute  abnormalities. Electronically Signed   By: Dorise Bullion III M.D   On: 01/16/2018 19:42   Dg Chest 2 View  Result Date: 01/10/2018 CLINICAL DATA:  Cough, shortness of Breath EXAM: CHEST  2 VIEW COMPARISON:  Heart and mediastinal contours are within normal limits. No focal opacities or effusions. No acute bony abnormality. FINDINGS: The heart size and mediastinal contours are within normal limits. Both lungs are clear. The visualized skeletal structures are unremarkable. IMPRESSION: No active cardiopulmonary disease. Electronically Signed   By: Rolm Baptise M.D.   On: 01/10/2018 20:27   Ct Angio Chest Pe W And/or Wo Contrast  Result Date: 01/16/2018 CLINICAL DATA:  Shortness of breath, productive cough. EXAM: CT ANGIOGRAPHY CHEST WITH CONTRAST TECHNIQUE: Multidetector CT imaging of the chest was performed using the standard protocol during bolus administration of intravenous contrast. Multiplanar CT image reconstructions and MIPs were obtained to evaluate the vascular anatomy. CONTRAST:  126mL ISOVUE-370 IOPAMIDOL (ISOVUE-370) INJECTION 76% COMPARISON:  None. FINDINGS: Cardiovascular: Large filling defects are seen in  the lower lobe branches of both pulmonary arteries consistent with acute pulmonary emboli. Atherosclerosis of thoracic aorta is noted without aneurysm or dissection. Normal cardiac size. No pericardial effusion. RV/LV ratio of 1.25 is measured which is abnormal. Mediastinum/Nodes: Moderate size sliding-type hiatal hernia is noted. Thyroid gland is unremarkable. No adenopathy is noted. Lungs/Pleura: Lungs are clear. No pleural effusion or pneumothorax. Upper Abdomen: No acute abnormality. Musculoskeletal: No chest wall abnormality. No acute or significant osseous findings. Review of the MIP images confirms the above findings. IMPRESSION: Large bilateral pulmonary emboli are noted. Positive for acute PE with CT evidence of right heart strain (RV/LV Ratio = 1.25) consistent with at least  submassive (intermediate risk) PE. The presence of right heart strain has been associated with an increased risk of morbidity and mortality. Please activate Code PE by paging [phone]. Critical Value/emergent results were called by telephone at the time of interpretation on 01/16/2018 at 9:42 pm to Dr. Nat Christen , who verbally acknowledged these results. Moderate size sliding-type hiatal hernia is noted. Electronically Signed   By: Marijo Conception, M.D.   On: 01/16/2018 21:43    Orson Eva, DO  [facility] Pager [phone]  If 7PM-7AM, please contact night-coverage [email or web address] Password [email or web address] 01/17/2018, 11:11 AM   LOS: 1 day

## 2018-01-17 NOTE — Care Management Note (Signed)
Case Management Note  Patient Details  Name: Lori Cooper MRN: [id number] Date of Birth: 05-26-1956  Subjective/Objective:    Adm from home with pulmonary embolism. Ind with ADL's. On IV heparin and 2 liters O2 currently.          Action/Plan: CM following for need of oral anticoagulant.    Expected Discharge Date:   unk               Expected Discharge Plan:  Home/Self Care  In-House Referral:     Discharge planning Services  CM Consult  Post Acute Care Choice:    Choice offered to:     DME Arranged:    DME Agency:     HH Arranged:    HH Agency:     Status of Service:  In process, will continue to follow  If discussed at Long Length of Stay Meetings, dates discussed:    Additional Comments:  Zakirah Weingart, Chauncey Reading, RN 01/17/2018, 4:07 PM

## 2018-01-17 NOTE — Progress Notes (Signed)
[place] for heparin Indication: pulmonary embolus  No Known Allergies  Patient Measurements: Height: 5\' 5"  (165.1 cm) Weight: 227 lb 11.8 oz (103.3 kg) IBW/kg (Calculated) : 57 Heparin Dosing Weight: 81.3 kg  Vital Signs: Temp: 98 F (36.7 C) (03/04 0400) Temp Source: Oral (03/04 0400) BP: 115/47 (03/04 0600) Pulse Rate: 84 (03/04 0600)  Labs: Recent Labs    01/16/18 2029 01/16/18 2252 01/17/18 0432  HGB 14.4  --  13.0  HCT 45.4  --  40.9  PLT 325  --  323  HEPARINUNFRC  --   --  1.14*  CREATININE 1.02*  --  0.91  CKTOTAL  --  31*  --   TROPONINI 0.07* 0.08* 0.07*    Estimated Creatinine Clearance: 75.4 mL/min (by C-G formula based on SCr of 0.91 mg/dL).   Medical History: Past Medical History:  Diagnosis Date  . Anxiety   . Chronic back pain   . DDD (degenerative disc disease), cervical   . DDD (degenerative disc disease), lumbosacral   . Depression   . GERD (gastroesophageal reflux disease)   . Hiatal hernia   . History of adenomatous polyp of colon    2009  tubular adenoma  . History of esophagitis   . History of idiopathic seizure    1984  x1 after vaginal delivery (per pt negative work-up and no issue since)  . History of left shoulder fracture    01/ 2014  proximal humerus fx  . Hyperlipidemia   . OA (osteoarthritis)    knees and thumbs  . RLS (restless legs syndrome)   . Supraumbilical hernia   . Umbilical hernia   . Wears dentures    lower    Medications:  See medication history  Assessment: 62 yo lady to start heparin for PE.  CT shows right heart strain.  She was not on anticoagulation PTA Initial heparin level supratherapeutic.   Goal of Therapy:  Heparin level 0.3-0.7 units/ml Monitor platelets by anticoagulation protocol: Yes   Plan:  Hold heparin for ~ 30 min Restart Heparin drip at 1150 units/hr Check heparin level 8 hours after restart Daily CBC and HL Monitor for bleeding  complications  Christe Tellez Poteet 01/17/2018,7:59 AM

## 2018-01-17 NOTE — Progress Notes (Signed)
ANTICOAGULATION CONSULT NOTE   Pharmacy Consult for heparin Indication: pulmonary embolus  No Known Allergies  Patient Measurements: Height: 5\' 5"  (165.1 cm) Weight: 227 lb 11.8 oz (103.3 kg) IBW/kg (Calculated) : 57 Heparin Dosing Weight: 81.3 kg  Vital Signs: Temp: 97.5 F (36.4 C) (03/04 1543) Temp Source: Oral (03/04 1210) BP: 117/67 (03/04 1543) Pulse Rate: 83 (03/04 1543)  Labs: Recent Labs    01/16/18 2029 01/16/18 2252 01/17/18 0432 01/17/18 1125 01/17/18 1629  HGB 14.4  --  13.0  --   --   HCT 45.4  --  40.9  --   --   PLT 325  --  323  --   --   HEPARINUNFRC  --   --  1.14*  --  0.54  CREATININE 1.02*  --  0.91  --   --   CKTOTAL  --  31*  --   --   --   TROPONINI 0.07* 0.08* 0.07* 0.07*  --     Estimated Creatinine Clearance: 75.4 mL/min (by C-G formula based on SCr of 0.91 mg/dL).   Medical History: Past Medical History:  Diagnosis Date  . Anxiety   . Chronic back pain   . DDD (degenerative disc disease), cervical   . DDD (degenerative disc disease), lumbosacral   . Depression   . GERD (gastroesophageal reflux disease)   . Hiatal hernia   . History of adenomatous polyp of colon    2009  tubular adenoma  . History of esophagitis   . History of idiopathic seizure    1984  x1 after vaginal delivery (per pt negative work-up and no issue since)  . History of left shoulder fracture    01/ 2014  proximal humerus fx  . Hyperlipidemia   . OA (osteoarthritis)    knees and thumbs  . RLS (restless legs syndrome)   . Supraumbilical hernia   . Umbilical hernia   . Wears dentures    lower    Medications:  See medication history  Assessment: 62 yo lady to start heparin for PE.  CT shows right heart strain.  She was not on anticoagulation PTA Heparin level is now therapeutic.  Goal of Therapy:  Heparin level 0.3-0.7 units/ml Monitor platelets by anticoagulation protocol: Yes   Plan:  Continue Heparin drip at 1150 units/hr Check heparin level  daily Daily CBC  Monitor for bleeding complications  Isac Sarna, BS Vena [place], BCPS Clinical Pharmacist Pager [phone] 01/17/2018,5:14 PM

## 2018-01-17 NOTE — Progress Notes (Signed)
*  PRELIMINARY RESULTS* Echocardiogram 2D Echocardiogram has been performed.  Lori Cooper 01/17/2018, 4:31 PM

## 2018-01-18 DIAGNOSIS — G2581 Restless legs syndrome: Secondary | ICD-10-CM

## 2018-01-18 LAB — CBC
HCT: 43.4 % (ref 36.0–46.0)
Hemoglobin: 13.5 g/dL (ref 12.0–15.0)
MCH: 28.2 pg (ref 26.0–34.0)
MCHC: 31.1 g/dL (ref 30.0–36.0)
MCV: 90.6 fL (ref 78.0–100.0)
Platelets: 294 10*3/uL (ref 150–400)
RBC: 4.79 MIL/uL (ref 3.87–5.11)
RDW: 13.9 % (ref 11.5–15.5)
WBC: 9.3 10*3/uL (ref 4.0–10.5)

## 2018-01-18 LAB — GLUCOSE, CAPILLARY: Glucose-Capillary: 106 mg/dL — ABNORMAL HIGH (ref 65–99)

## 2018-01-18 LAB — BASIC METABOLIC PANEL
Anion gap: 10 (ref 5–15)
BUN: 18 mg/dL (ref 6–20)
CO2: 25 mmol/L (ref 22–32)
Calcium: 8.9 mg/dL (ref 8.9–10.3)
Chloride: 104 mmol/L (ref 101–111)
Creatinine, Ser: 1.01 mg/dL — ABNORMAL HIGH (ref 0.44–1.00)
GFR calc Af Amer: >60 mL/min (ref >60)
GFR calc non Af Amer: 58 mL/min — ABNORMAL LOW (ref >60)
Glucose, Bld: 117 mg/dL — ABNORMAL HIGH (ref 65–99)
Potassium: 4.1 mmol/L (ref 3.5–5.1)
Sodium: 139 mmol/L (ref 135–145)

## 2018-01-18 LAB — HIV ANTIBODY (ROUTINE TESTING W REFLEX): HIV Screen 4th Generation wRfx: NONREACTIVE

## 2018-01-18 LAB — HEPARIN LEVEL (UNFRACTIONATED): Heparin Unfractionated: 0.77 IU/mL — ABNORMAL HIGH (ref 0.30–0.70)

## 2018-01-18 LAB — ECHOCARDIOGRAM COMPLETE
Height: 65 in
Weight: 3643.76 oz

## 2018-01-18 MED ORDER — ATORVASTATIN CALCIUM 40 MG PO TABS
40.0000 mg | ORAL_TABLET | Freq: Every day | ORAL | Status: DC
  Administered 2018-01-18: 40 mg via ORAL
  Filled 2018-01-18: qty 1

## 2018-01-18 MED ORDER — APIXABAN 5 MG PO TABS
10.0000 mg | ORAL_TABLET | Freq: Two times a day (BID) | ORAL | Status: DC
  Administered 2018-01-18 – 2018-01-19 (×2): 10 mg via ORAL
  Filled 2018-01-18 (×2): qty 2

## 2018-01-18 MED ORDER — APIXABAN 5 MG PO TABS
5.0000 mg | ORAL_TABLET | Freq: Two times a day (BID) | ORAL | Status: DC
Start: 2018-01-25 — End: 2018-01-19

## 2018-01-18 NOTE — Progress Notes (Addendum)
PROGRESS NOTE  Lori Cooper WPY:[id number] DOB: 01-18-1956 DOA: 01/16/2018 PCP: Sharion Balloon, FNP  Brief History:  62 year old female with a history of hyperlipidemia, restless leg syndrome, anxiety/depression, hyperlipidemia, GERD presented with 1 week history of shortness of breath.  The patient went to see her primary care provider on 01/10/2018.  Apparently, his chest x-ray was negative at that time.  The patient was given a prescription for doxycycline and prednisone taper.  There is no improvement in her shortness of breath.  She continued to have dyspnea on exertion and began to have some lightheadedness.  She also had some right sided thigh pain.  As a result, she presented for further evaluation.  She denies any recent surgeries, new medications, immobilization, traveling.  She states that her last major surgery was abdominal mesh placement about a year ago as well as some dental surgery in September 2018.  In the emergency department, the patient was afebrile hemodynamically stable.  She had a troponin of 0.07.  EKG showed sinus rhythm with nonspecific T wave changes.  CT angiogram chest submassive bilateral lower lobe pulmonary emboli--with RV strain. No consolidation or pulmonary edema.  Pt was started on IV heparin.    Assessment/Plan: Acute submassive PE -Unprovoked by clinical history -venous duplex legs--neg DVT -Echocardiogram--EF 60-65%, no WMA, moderate dilated RV,   Mild TR.  PASP 45. Low normal RV EF -She is currently hemodynamically stable saturating well on oxygen -Check prothrombin gene mutation, lupus anticoagulant, factor V Leiden--results pending -PESI Class III--intermediate risk of complications/mortality -Continue IV heparin x 48 hours -switch to po apixaban 01/18/18 evening  Elevated troponin -Secondary to pulmonary embolus -Trend is flat -Echocardiogram--no WMA, EF 60-65% -Personally reviewed EKG--sinus rhythm, nonspecific T wave change    -Personally reviewed chest x-ray--no edema or consolidation  Acute respiratory Failure with hypoxia -secondary to PE in the setting of COPD -stable on 2L presently-->99% -pt has ~60 pack year history of tobacco, quit 5 yrs ago -desat to 88% on RA with ambulation 01/18/18 -repeat ambulatory pulse ox prior to d/c  Hyperlipidemia -Restart statin -CPK 31  Depression/Anxiety -continue Wellbutrin and fluoxetine  GERD -Continue PPI   Disposition Plan:   Home 01/19/18 if stable Family Communication:  Spouse updated at bedside--3/5 counseling and coordinating care.   Consultants:  none  Code Status:  FULL  DVT Prophylaxis:  IV heparin   Procedures: As Listed in Progress Note Above  Antibiotics: None      Subjective: Patient complains of some dyspnea with exertion.  She denies any chest pain, syncope, nausea, vomiting, diarrhea, abdominal pain.  He denies any fevers chills.  Objective: Vitals:   01/17/18 2023 01/18/18 0445 01/18/18 1300 01/18/18 1429  BP: (!) 121/54 (!) 120/45 (!) 109/54   Pulse: 83 80 88   Resp:   18   Temp: 98.4 F (36.9 C) 98.1 F (36.7 C) 98.2 F (36.8 C)   TempSrc: Oral Oral Oral   SpO2: 100% 96% 100% (!) 88%  Weight:      Height:        Intake/Output Summary (Last 24 hours) at 01/18/2018 1650 Last data filed at 01/18/2018 1500 Gross per 24 hour  Intake 1233.37 ml  Output 300 ml  Net 933.37 ml   Weight change:  Exam:   General:  Pt is alert, follows commands appropriately, not in acute distress  HEENT: No icterus, No thrush, No neck mass, [employer]/AT  Cardiovascular: RRR, S1/S2, no rubs, no  gallops  Respiratory: Bibasilar crackles.  No wheezing.  Good air movement  Abdomen: Soft/+BS, non tender, non distended, no guarding  Extremities: No edema, No lymphangitis, No petechiae, No rashes, no synovitis   Data Reviewed: I have personally reviewed following labs and imaging studies Basic Metabolic Panel: Recent Labs  Lab  01/16/18 2029 01/17/18 0432 01/18/18 0424  NA 143 138 139  K 4.0 3.7 4.1  CL 105 105 104  CO2 24 22 25   GLUCOSE 129* 117* 117*  BUN 25* 19 18  CREATININE 1.02* 0.91 1.01*  CALCIUM 9.0 8.7* 8.9   Liver Function Tests: Recent Labs  Lab 01/16/18 2029  AST 26  ALT 29  ALKPHOS 115  BILITOT 0.6  PROT 6.8  ALBUMIN 3.7   No results for input(s): LIPASE, AMYLASE in the last 168 hours. No results for input(s): AMMONIA in the last 168 hours. Coagulation Profile: No results for input(s): INR, PROTIME in the last 168 hours. CBC: Recent Labs  Lab 01/16/18 2029 01/17/18 0432 01/18/18 0424  WBC 13.5* 13.3* 9.3  NEUTROABS 5.9  --   --   HGB 14.4 13.0 13.5  HCT 45.4 40.9 43.4  MCV 90.8 88.9 90.6  PLT 325 323 294   Cardiac Enzymes: Recent Labs  Lab 01/16/18 2029 01/16/18 2252 01/17/18 0432 01/17/18 1125  CKTOTAL  --  31*  --   --   TROPONINI 0.07* 0.08* 0.07* 0.07*   BNP: Invalid input(s): POCBNP CBG: No results for input(s): GLUCAP in the last 168 hours. HbA1C: No results for input(s): HGBA1C in the last 72 hours. Urine analysis:    Component Value Date/Time   APPEARANCEUR Clear 03/23/2017 1006   GLUCOSEU Negative 03/23/2017 1006   BILIRUBINUR Negative 03/23/2017 1006   PROTEINUR 1+ (A) 03/23/2017 1006   NITRITE Negative 03/23/2017 1006   LEUKOCYTESUR 1+ (A) 03/23/2017 1006   Sepsis Labs: @LABRCNTIP (procalcitonin:4,lacticidven:4) ) Recent Results (from the past 240 hour(s))  MRSA PCR Screening     Status: None   Collection Time: 01/17/18  1:20 AM  Result Value Ref Range Status   MRSA by PCR NEGATIVE NEGATIVE Final    Comment:        The GeneXpert MRSA Assay (FDA approved for NASAL specimens only), is one component of a comprehensive MRSA colonization surveillance program. It is not intended to diagnose MRSA infection nor to guide or monitor treatment for MRSA infections. Performed at [facility], [address], [place], [place] [address]       Scheduled Meds: . aspirin EC  81 mg Oral QODAY  . buPROPion  150 mg Oral Daily  . cholecalciferol  5,000 Units Oral Daily  . FLUoxetine  80 mg Oral Daily  . pantoprazole  40 mg Oral Daily   Continuous Infusions: . heparin 1,100 Units/hr (01/18/18 0944)    Procedures/Studies: Dg Chest 2 View  Result Date: 01/16/2018 CLINICAL DATA:  Shortness of breath.  Nonproductive cough. EXAM: CHEST  2 VIEW COMPARISON:  None. FINDINGS: Small hiatal hernia. The heart, hila, mediastinum, lungs, and pleura are normal. IMPRESSION: Small hiatal hernia.  No acute abnormalities. Electronically Signed   By: Dorise Bullion III M.D   On: 01/16/2018 19:42   Dg Chest 2 View  Result Date: 01/10/2018 CLINICAL DATA:  Cough, shortness of Breath EXAM: CHEST  2 VIEW COMPARISON:  Heart and mediastinal contours are within normal limits. No focal opacities or effusions. No acute bony abnormality. FINDINGS: The heart size and mediastinal contours are within normal limits. Both lungs are  clear. The visualized skeletal structures are unremarkable. IMPRESSION: No active cardiopulmonary disease. Electronically Signed   By: Rolm Baptise M.D.   On: 01/10/2018 20:27   Ct Angio Chest Pe W And/or Wo Contrast  Result Date: 01/16/2018 CLINICAL DATA:  Shortness of breath, productive cough. EXAM: CT ANGIOGRAPHY CHEST WITH CONTRAST TECHNIQUE: Multidetector CT imaging of the chest was performed using the standard protocol during bolus administration of intravenous contrast. Multiplanar CT image reconstructions and MIPs were obtained to evaluate the vascular anatomy. CONTRAST:  168mL ISOVUE-370 IOPAMIDOL (ISOVUE-370) INJECTION 76% COMPARISON:  None. FINDINGS: Cardiovascular: Large filling defects are seen in the lower lobe branches of both pulmonary arteries consistent with acute pulmonary emboli. Atherosclerosis of thoracic aorta is noted without aneurysm or dissection. Normal cardiac size. No pericardial effusion. RV/LV ratio of 1.25 is  measured which is abnormal. Mediastinum/Nodes: Moderate size sliding-type hiatal hernia is noted. Thyroid gland is unremarkable. No adenopathy is noted. Lungs/Pleura: Lungs are clear. No pleural effusion or pneumothorax. Upper Abdomen: No acute abnormality. Musculoskeletal: No chest wall abnormality. No acute or significant osseous findings. Review of the MIP images confirms the above findings. IMPRESSION: Large bilateral pulmonary emboli are noted. Positive for acute PE with CT evidence of right heart strain (RV/LV Ratio = 1.25) consistent with at least submassive (intermediate risk) PE. The presence of right heart strain has been associated with an increased risk of morbidity and mortality. Please activate Code PE by paging [phone]. Critical Value/emergent results were called by telephone at the time of interpretation on 01/16/2018 at 9:42 pm to Dr. Nat Christen , who verbally acknowledged these results. Moderate size sliding-type hiatal hernia is noted. Electronically Signed   By: Marijo Conception, M.D.   On: 01/16/2018 21:43   US Venous Img Lower Bilateral  Result Date: 01/17/2018 CLINICAL DATA:  Pulmonary embolus on CT EXAM: BILATERAL LOWER EXTREMITY VENOUS DOPPLER ULTRASOUND TECHNIQUE: Gray-scale sonography with graded compression, as well as color Doppler and duplex ultrasound were performed to evaluate the lower extremity deep venous systems from the level of the common femoral vein and including the common femoral, femoral, profunda femoral, popliteal and calf veins including the posterior tibial, peroneal and gastrocnemius veins when visible. The superficial great saphenous vein was also interrogated. Spectral Doppler was utilized to evaluate flow at rest and with distal augmentation maneuvers in the common femoral, femoral and popliteal veins. COMPARISON:  None. FINDINGS: RIGHT LOWER EXTREMITY Common Femoral Vein: No evidence of thrombus. Normal compressibility, respiratory phasicity and response to  augmentation. Saphenofemoral Junction: No evidence of thrombus. Normal compressibility and flow on color Doppler imaging. Profunda Femoral Vein: No evidence of thrombus. Normal compressibility and flow on color Doppler imaging. Femoral Vein: No evidence of thrombus. Normal compressibility, respiratory phasicity and response to augmentation. Popliteal Vein: No evidence of thrombus. Normal compressibility, respiratory phasicity and response to augmentation. Calf Veins: No evidence of thrombus. Normal compressibility and flow on color Doppler imaging. Superficial Great Saphenous Vein: No evidence of thrombus. Normal compressibility. Venous Reflux:  None. Other Findings:  None. LEFT LOWER EXTREMITY Common Femoral Vein: No evidence of thrombus. Normal compressibility, respiratory phasicity and response to augmentation. Saphenofemoral Junction: No evidence of thrombus. Normal compressibility and flow on color Doppler imaging. Profunda Femoral Vein: No evidence of thrombus. Normal compressibility and flow on color Doppler imaging. Femoral Vein: No evidence of thrombus. Normal compressibility, respiratory phasicity and response to augmentation. Popliteal Vein: No evidence of thrombus. Normal compressibility, respiratory phasicity and response to augmentation. Calf Veins: No evidence of thrombus. Normal  compressibility and flow on color Doppler imaging. Superficial Great Saphenous Vein: No evidence of thrombus. Normal compressibility. Venous Reflux:  None. Other Findings:  None. IMPRESSION: No evidence of deep venous thrombosis. Electronically Signed   By: Rolm Baptise M.D.   On: 01/17/2018 11:17    Orson Eva, DO  [facility] Pager [phone]  If 7PM-7AM, please contact night-coverage [email or web address] Password [email or web address] 01/18/2018, 4:50 PM   LOS: 2 days

## 2018-01-18 NOTE — Care Management Note (Signed)
Case Management Note  Patient Details  Name: Alexina Niccoli MRN: [id number] Date of Birth: 06-20-1956  Expected Discharge Date:     01/19/2018             Expected Discharge Plan:  Home/Self Care  In-House Referral:  NA  Discharge planning Services  CM Consult  Post Acute Care Choice:  NA Choice offered to:  NA  Status of Service:  Completed, signed off  If discussed at Long Length of Stay Meetings, dates discussed:    Additional Comments: Anticipate DC home tomorrow. Pt given 30-day free card for Eliquis. Pt currently still on supplemental oxygen. Home O2 assessment recommended as pt has no qualifying dx for oxygen at DC.   Sherald Barge, RN 01/18/2018, 1:07 PM

## 2018-01-18 NOTE — Progress Notes (Signed)
ANTICOAGULATION CONSULT NOTE   Pharmacy Consult for heparin Indication: pulmonary embolus  No Known Allergies  Patient Measurements: Height: 5\' 5"  (165.1 cm) Weight: 227 lb 11.8 oz (103.3 kg) IBW/kg (Calculated) : 57 Heparin Dosing Weight: 81.3 kg  Vital Signs: Temp: 98.1 F (36.7 C) (03/05 0445) Temp Source: Oral (03/05 0445) BP: 120/45 (03/05 0445) Pulse Rate: 80 (03/05 0445)  Labs: Recent Labs    01/16/18 2029 01/16/18 2252 01/17/18 0432 01/17/18 1125 01/17/18 1629 01/18/18 0424  HGB 14.4  --  13.0  --   --  13.5  HCT 45.4  --  40.9  --   --  43.4  PLT 325  --  323  --   --  294  HEPARINUNFRC  --   --  1.14*  --  0.54 0.77*  CREATININE 1.02*  --  0.91  --   --  1.01*  CKTOTAL  --  31*  --   --   --   --   TROPONINI 0.07* 0.08* 0.07* 0.07*  --   --    Estimated Creatinine Clearance: 68 mL/min (A) (by C-G formula based on SCr of 1.01 mg/dL (H)).  Medical History: Past Medical History:  Diagnosis Date  . Anxiety   . Chronic back pain   . DDD (degenerative disc disease), cervical   . DDD (degenerative disc disease), lumbosacral   . Depression   . GERD (gastroesophageal reflux disease)   . Hiatal hernia   . History of adenomatous polyp of colon    2009  tubular adenoma  . History of esophagitis   . History of idiopathic seizure    1984  x1 after vaginal delivery (per pt negative work-up and no issue since)  . History of left shoulder fracture    01/ 2014  proximal humerus fx  . Hyperlipidemia   . OA (osteoarthritis)    knees and thumbs  . RLS (restless legs syndrome)   . Supraumbilical hernia   . Umbilical hernia   . Wears dentures    lower    Medications:  See medication history  Assessment: 62 yo lady to start heparin for PE.  CT shows right heart strain.  She was not on anticoagulation PTA.  Heparin level is now slightly above goal.    Goal of Therapy:  Heparin level 0.3-0.7 units/ml Monitor platelets by anticoagulation protocol: Yes    Plan:  Reduce Heparin drip to 1100 units/hr Check heparin level daily Daily CBC  Monitor for bleeding complications  Hart Robinsons, PharmD Clinical Pharmacist Pager:  [phone] 01/18/2018   01/18/2018,11:24 AM

## 2018-01-18 NOTE — Progress Notes (Signed)
[place] for heparin >> APIXABAN Indication: pulmonary embolus  No Known Allergies  Patient Measurements: Height: 5\' 5"  (165.1 cm) Weight: 227 lb 11.8 oz (103.3 kg) IBW/kg (Calculated) : 57 Heparin Dosing Weight: 81.3 kg  Vital Signs: Temp: 98.2 F (36.8 C) (03/05 1300) Temp Source: Oral (03/05 1300) BP: 109/54 (03/05 1300) Pulse Rate: 88 (03/05 1300)  Labs: Recent Labs    01/16/18 2029 01/16/18 2252 01/17/18 0432 01/17/18 1125 01/17/18 1629 01/18/18 0424  HGB 14.4  --  13.0  --   --  13.5  HCT 45.4  --  40.9  --   --  43.4  PLT 325  --  323  --   --  294  HEPARINUNFRC  --   --  1.14*  --  0.54 0.77*  CREATININE 1.02*  --  0.91  --   --  1.01*  CKTOTAL  --  31*  --   --   --   --   TROPONINI 0.07* 0.08* 0.07* 0.07*  --   --    Estimated Creatinine Clearance: 68 mL/min (A) (by C-G formula based on SCr of 1.01 mg/dL (H)).  Medical History: Past Medical History:  Diagnosis Date  . Anxiety   . Chronic back pain   . DDD (degenerative disc disease), cervical   . DDD (degenerative disc disease), lumbosacral   . Depression   . GERD (gastroesophageal reflux disease)   . Hiatal hernia   . History of adenomatous polyp of colon    2009  tubular adenoma  . History of esophagitis   . History of idiopathic seizure    1984  x1 after vaginal delivery (per pt negative work-up and no issue since)  . History of left shoulder fracture    01/ 2014  proximal humerus fx  . Hyperlipidemia   . OA (osteoarthritis)    knees and thumbs  . RLS (restless legs syndrome)   . Supraumbilical hernia   . Umbilical hernia   . Wears dentures    lower   Medications:  See medication history  Assessment: 62 yo lady to start heparin for PE.  CT shows right heart strain.  She was not on anticoagulation PTA.  Heparin now being switched to Apixaban PO.  Goal of Therapy:  Heparin level 0.3-0.7 units/ml Monitor platelets by anticoagulation protocol:  Yes   Plan:  D/C Heparin drip when Apixaban dose to be given Apixaban 10mg  po BID x 7 days then 5mg  po BID thereafter Provide written education and voucher  Monitor for bleeding complications  Hart Robinsons, PharmD Clinical Pharmacist Pager:  [phone] 01/18/2018   01/18/2018,4:57 PM

## 2018-01-19 ENCOUNTER — Telehealth: Payer: Self-pay | Admitting: Family

## 2018-01-19 ENCOUNTER — Other Ambulatory Visit: Payer: Self-pay | Admitting: Family

## 2018-01-19 DIAGNOSIS — I2602 Saddle embolus of pulmonary artery with acute cor pulmonale: Secondary | ICD-10-CM

## 2018-01-19 LAB — LUPUS ANTICOAGULANT PANEL
DRVVT: 39.9 s (ref 0.0–47.0)
PTT Lupus Anticoagulant: 29.3 s (ref 0.0–51.9)

## 2018-01-19 LAB — CBC
HCT: 40.2 % (ref 36.0–46.0)
Hemoglobin: 12.5 g/dL (ref 12.0–15.0)
MCH: 28.2 pg (ref 26.0–34.0)
MCHC: 31.1 g/dL (ref 30.0–36.0)
MCV: 90.5 fL (ref 78.0–100.0)
Platelets: 295 10*3/uL (ref 150–400)
RBC: 4.44 MIL/uL (ref 3.87–5.11)
RDW: 14.1 % (ref 11.5–15.5)
WBC: 10.1 10*3/uL (ref 4.0–10.5)

## 2018-01-19 LAB — GLUCOSE, CAPILLARY: Glucose-Capillary: 110 mg/dL — ABNORMAL HIGH (ref 65–99)

## 2018-01-19 MED ORDER — CHOLECALCIFEROL 125 MCG (5000 UT) PO TABS: 5000.0000 [IU] | ORAL_TABLET | Freq: Every day | ORAL | Status: DC

## 2018-01-19 MED ORDER — APIXABAN 5 MG PO TABS: 10.0000 mg | ORAL_TABLET | Freq: Two times a day (BID) | ORAL | 0 refills | Status: DC

## 2018-01-19 MED ORDER — APIXABAN 5 MG PO TABS: 5.0000 mg | ORAL_TABLET | Freq: Two times a day (BID) | ORAL | 2 refills | Status: DC

## 2018-01-19 NOTE — Care Management Important Message (Signed)
Important Message  Patient Details  Name: Lori Cooper MRN: [id number] Date of Birth: 1956/10/16   Medicare Important Message Given:  Yes    Sherald Barge, RN 01/19/2018, 11:12 AM

## 2018-01-19 NOTE — Telephone Encounter (Signed)
Rx filled

## 2018-01-19 NOTE — Telephone Encounter (Signed)
What is the name of the medication?FLUoxetine (PROZAC) 40 MG capsule  Have you contacted your pharmacy to request a refill? yes  Which pharmacy would you like this sent to? [facility]   Patient notified that their request is being sent to the clinical staff for review and that they should receive a call once it is complete. If they do not receive a call within 24 hours they can check with their pharmacy or our office.

## 2018-01-19 NOTE — Progress Notes (Signed)
Patient is to be discharged home and in stable condition. Patient's IV and telemetry removed, WNL. Patient given discharge instructions and verbalized understanding. Patient awaiting transportation at this time and will be escorted out by staff via wheelchair upon husband's arrival.  Celestia Khat, RN

## 2018-01-19 NOTE — Discharge Instructions (Signed)
Information on my medicine - ELIQUIS (apixaban)  This medication education was reviewed with me or my healthcare representative as part of my discharge preparation.   Why was Eliquis prescribed for you? Eliquis was prescribed to treat blood clots that may have been found in the veins of your legs (deep vein thrombosis) or in your lungs (pulmonary embolism) and to reduce the risk of them occurring again.  What do You need to know about Eliquis ? The starting dose is 10 mg (two 5 mg tablets) taken TWICE daily for the FIRST SEVEN (7) DAYS, then on (enter date)  01/25/18  the dose is reduced to ONE 5 mg tablet taken TWICE daily.  Eliquis may be taken with or without food.   Try to take the dose about the same time in the morning and in the evening. If you have difficulty swallowing the tablet whole please discuss with your pharmacist how to take the medication safely.  Take Eliquis exactly as prescribed and DO NOT stop taking Eliquis without talking to the doctor who prescribed the medication.  Stopping may increase your risk of developing a new blood clot.  Refill your prescription before you run out.  After discharge, you should have regular check-up appointments with your healthcare provider that is prescribing your Eliquis.    What do you do if you miss a dose? If a dose of ELIQUIS is not taken at the scheduled time, take it as soon as possible on the same day and twice-daily administration should be resumed. The dose should not be doubled to make up for a missed dose.  Important Safety Information A possible side effect of Eliquis is bleeding. You should call your healthcare provider right away if you experience any of the following: ? Bleeding from an injury or your nose that does not stop. ? Unusual colored urine (red or dark brown) or unusual colored stools (red or black). ? Unusual bruising for unknown reasons. ? A serious fall or if you hit your head (even if there is no  bleeding).  Some medicines may interact with Eliquis and might increase your risk of bleeding or clotting while on Eliquis. To help avoid this, consult your healthcare provider or pharmacist prior to using any new prescription or non-prescription medications, including herbals, vitamins, non-steroidal anti-inflammatory drugs (NSAIDs) and supplements.  This website has more information on Eliquis (apixaban): http://www.eliquis.com/eliquis/home

## 2018-01-19 NOTE — Telephone Encounter (Signed)
Patient has a follow up appointment scheduled. 

## 2018-01-19 NOTE — Discharge Summary (Signed)
Physician Discharge Summary  Lori Cooper IRS:[id number] DOB: 10-12-1956 DOA: 01/16/2018  PCP: Sharion Balloon, FNP  Admit date: 01/16/2018 Discharge date: 01/19/2018  Time spent: 45 minutes  Recommendations for Outpatient Follow-up:  -To be discharged home today. -We will need to take Eliquis for at least 9-12 months:  10 mg twice a day for 14 days and then reduce dose to 5 mg daily. -Results of hypercoagulable panel need to be followed at time of hospital follow-up.  Discharge Diagnoses:  Principal Problem:   Pulmonary embolism with acute cor pulmonale (HCC) Active Problems:   HLD (hyperlipidemia)   GERD (gastroesophageal reflux disease)   Generalized anxiety disorder   Restless legs   Depression   Pulmonary emboli [facility])   Discharge Condition: Stable and improved  Filed Weights   01/16/18 1828 01/17/18 0127  Weight: 104.8 kg (231 lb) 103.3 kg (227 lb 11.8 oz)    History of present illness:  As per Dr. Manuella Ghazi on 3/3:  Lori Cooper is a 62 y.o. female with medical history significant for anxiety/depression, arthritis, GERD, dyslipidemia, and restless leg syndrome who has developed worsening shortness of breath over the last 1 week.  She went to see her primary care provider this past Monday and was given a prednisone taper as well as some doxycycline and chest x-ray findings at that time did not demonstrate any acute abnormalities.  Despite taking these medications, she continued to have ongoing shortness of breath, particularly with exertion and she noted some lightheadedness with it as well.  She states that she has also had some right-sided thigh pain as well as some on the left side that developed a little later.  She denies any significant swelling to her legs.  She denies any recent immobilization, travel, or surgeries and states that her last major surgery was abdominal mesh placement about a year ago as well as some dental surgery in September 2018. She denies chest pain or  palpitations. She currently denies any dyspnea, cough, sputum production, fever, or chills.   ED Course: Vital signs are noted to be stable with some soft blood pressure readings, but systolic blood pressure remains in the low 100 range.  EKG demonstrates sinus rhythm and she is currently approximately 90-100 bpm on telemetry.  Troponin is mildly elevated at 0.07.  CT of the chest with contrast demonstrates large bilateral PE that is characterized as submassive with RV strain noted.  Patient was actually saturating 93% on room air with no respiratory distress and has just now been placed on 2 L nasal cannula.  Heparin drip has been ordered.    Hospital Course:   Acute submassive PE -Unprovoked by clinical history, patient states her father died from a fatal PE. -Venous Dopplers are negative for DVT. -Echocardiogram shows an ejection fraction of 60-65% with no wall motion abnormalities, moderately dilated RV with a pulmonary artery systolic pressure of 45. -Continue apixaban, case management has reviewed eligibility. -Hypercoagulable panel specifically prothrombin gene mutation, lupus anticoagulant and factor V Leiden are pending at time of discharge and need to be followed up on.  I believe genetic factors are important in this patient whose father also had a large in his case fatal PE.  Acute hypoxemic respiratory failure -Secondary to large PE in setting of COPD. -Currently does not have oxygen requirements.  Elevated troponin -Secondary to large PE, trend is flat, denies chest pain, echocardiogram with normal EF and no wall motion abnormalities, no further cardiac workup at present.  Hyperlipidemia -  Restart statin -CPK 31  Depression/Anxiety -continueWellbutrin and fluoxetine  GERD -Continue PPI    Procedures:  None   Consultations:  None  Discharge Instructions  Discharge Instructions    Diet - low sodium heart healthy   Complete by:  As directed    Increase  activity slowly   Complete by:  As directed      Allergies as of 01/19/2018   No Known Allergies     Medication List    STOP taking these medications   doxycycline 100 MG tablet Commonly known as:  VIBRA-TABS   predniSONE 10 MG (21) Tbpk tablet Commonly known as:  STERAPRED UNI-PAK 21 TAB   Vitamin D3 5000 units Caps Replaced by:  Cholecalciferol 5000 units Tabs     TAKE these medications   albuterol 108 (90 Base) MCG/ACT inhaler Commonly known as:  PROVENTIL HFA;VENTOLIN HFA Inhale 2 puffs into the lungs every 6 (six) hours as needed for wheezing or shortness of breath.   apixaban 5 MG Tabs tablet Commonly known as:  ELIQUIS Take 2 tablets (10 mg total) by mouth 2 (two) times daily.   apixaban 5 MG Tabs tablet Commonly known as:  ELIQUIS Take 1 tablet (5 mg total) by mouth 2 (two) times daily. Start taking on:  01/25/2018   aspirin 81 MG tablet Take 81 mg by mouth every other day.   atorvastatin 40 MG tablet Commonly known as:  LIPITOR TAKE 1 TABLET BY MOUTH ONCE DAILY   buPROPion 150 MG 24 hr tablet Commonly known as:  WELLBUTRIN XL TAKE 1 TABLET BY MOUTH ONCE DAILY   Cholecalciferol 5000 units Tabs Take 1 tablet (5,000 Units total) by mouth daily. Start taking on:  01/20/2018 Replaces:  Vitamin D3 5000 units Caps   cyclobenzaprine 10 MG tablet Commonly known as:  FLEXERIL TAKE 1 TABLET BY MOUTH THREE TIMES DAILY AS NEEDED FOR MUSCLE SPASM   dexlansoprazole 60 MG capsule Commonly known as:  DEXILANT Take 1 capsule (60 mg total) daily by mouth.   ergocalciferol 50000 units capsule Commonly known as:  VITAMIN D2 Take 50,000 Units by mouth once a week. Saturday   FLUoxetine 40 MG capsule Commonly known as:  PROZAC TAKE 2 CAPSULES BY MOUTH ONCE DAILY   HYDROcodone-acetaminophen 10-325 MG tablet Commonly known as:  NORCO Take 1 tablet by mouth every 6 (six) hours as needed.   LORazepam 0.5 MG tablet Commonly known as:  ATIVAN TAKE 1 TABLET BY MOUTH  EVERY 8 HOURS AS NEEDED      No Known Allergies    The results of significant diagnostics from this hospitalization (including imaging, microbiology, ancillary and laboratory) are listed below for reference.    Significant Diagnostic Studies: Dg Chest 2 View  Result Date: 01/16/2018 CLINICAL DATA:  Shortness of breath.  Nonproductive cough. EXAM: CHEST  2 VIEW COMPARISON:  None. FINDINGS: Small hiatal hernia. The heart, hila, mediastinum, lungs, and pleura are normal. IMPRESSION: Small hiatal hernia.  No acute abnormalities. Electronically Signed   By: Dorise Bullion III M.D   On: 01/16/2018 19:42   Dg Chest 2 View  Result Date: 01/10/2018 CLINICAL DATA:  Cough, shortness of Breath EXAM: CHEST  2 VIEW COMPARISON:  Heart and mediastinal contours are within normal limits. No focal opacities or effusions. No acute bony abnormality. FINDINGS: The heart size and mediastinal contours are within normal limits. Both lungs are clear. The visualized skeletal structures are unremarkable. IMPRESSION: No active cardiopulmonary disease. Electronically Signed   By: Rolm Baptise  M.D.   On: 01/10/2018 20:27   Ct Angio Chest Pe W And/or Wo Contrast  Result Date: 01/16/2018 CLINICAL DATA:  Shortness of breath, productive cough. EXAM: CT ANGIOGRAPHY CHEST WITH CONTRAST TECHNIQUE: Multidetector CT imaging of the chest was performed using the standard protocol during bolus administration of intravenous contrast. Multiplanar CT image reconstructions and MIPs were obtained to evaluate the vascular anatomy. CONTRAST:  186mL ISOVUE-370 IOPAMIDOL (ISOVUE-370) INJECTION 76% COMPARISON:  None. FINDINGS: Cardiovascular: Large filling defects are seen in the lower lobe branches of both pulmonary arteries consistent with acute pulmonary emboli. Atherosclerosis of thoracic aorta is noted without aneurysm or dissection. Normal cardiac size. No pericardial effusion. RV/LV ratio of 1.25 is measured which is abnormal.  Mediastinum/Nodes: Moderate size sliding-type hiatal hernia is noted. Thyroid gland is unremarkable. No adenopathy is noted. Lungs/Pleura: Lungs are clear. No pleural effusion or pneumothorax. Upper Abdomen: No acute abnormality. Musculoskeletal: No chest wall abnormality. No acute or significant osseous findings. Review of the MIP images confirms the above findings. IMPRESSION: Large bilateral pulmonary emboli are noted. Positive for acute PE with CT evidence of right heart strain (RV/LV Ratio = 1.25) consistent with at least submassive (intermediate risk) PE. The presence of right heart strain has been associated with an increased risk of morbidity and mortality. Please activate Code PE by paging [phone]. Critical Value/emergent results were called by telephone at the time of interpretation on 01/16/2018 at 9:42 pm to Dr. Nat Christen , who verbally acknowledged these results. Moderate size sliding-type hiatal hernia is noted. Electronically Signed   By: Marijo Conception, M.D.   On: 01/16/2018 21:43   US Venous Img Lower Bilateral  Result Date: 01/17/2018 CLINICAL DATA:  Pulmonary embolus on CT EXAM: BILATERAL LOWER EXTREMITY VENOUS DOPPLER ULTRASOUND TECHNIQUE: Gray-scale sonography with graded compression, as well as color Doppler and duplex ultrasound were performed to evaluate the lower extremity deep venous systems from the level of the common femoral vein and including the common femoral, femoral, profunda femoral, popliteal and calf veins including the posterior tibial, peroneal and gastrocnemius veins when visible. The superficial great saphenous vein was also interrogated. Spectral Doppler was utilized to evaluate flow at rest and with distal augmentation maneuvers in the common femoral, femoral and popliteal veins. COMPARISON:  None. FINDINGS: RIGHT LOWER EXTREMITY Common Femoral Vein: No evidence of thrombus. Normal compressibility, respiratory phasicity and response to augmentation. Saphenofemoral  Junction: No evidence of thrombus. Normal compressibility and flow on color Doppler imaging. Profunda Femoral Vein: No evidence of thrombus. Normal compressibility and flow on color Doppler imaging. Femoral Vein: No evidence of thrombus. Normal compressibility, respiratory phasicity and response to augmentation. Popliteal Vein: No evidence of thrombus. Normal compressibility, respiratory phasicity and response to augmentation. Calf Veins: No evidence of thrombus. Normal compressibility and flow on color Doppler imaging. Superficial Great Saphenous Vein: No evidence of thrombus. Normal compressibility. Venous Reflux:  None. Other Findings:  None. LEFT LOWER EXTREMITY Common Femoral Vein: No evidence of thrombus. Normal compressibility, respiratory phasicity and response to augmentation. Saphenofemoral Junction: No evidence of thrombus. Normal compressibility and flow on color Doppler imaging. Profunda Femoral Vein: No evidence of thrombus. Normal compressibility and flow on color Doppler imaging. Femoral Vein: No evidence of thrombus. Normal compressibility, respiratory phasicity and response to augmentation. Popliteal Vein: No evidence of thrombus. Normal compressibility, respiratory phasicity and response to augmentation. Calf Veins: No evidence of thrombus. Normal compressibility and flow on color Doppler imaging. Superficial Great Saphenous Vein: No evidence of thrombus. Normal compressibility. Venous Reflux:  None. Other Findings:  None. IMPRESSION: No evidence of deep venous thrombosis. Electronically Signed   By: Rolm Baptise M.D.   On: 01/17/2018 11:17    Microbiology: Recent Results (from the past 240 hour(s))  MRSA PCR Screening     Status: None   Collection Time: 01/17/18  1:20 AM  Result Value Ref Range Status   MRSA by PCR NEGATIVE NEGATIVE Final    Comment:        The GeneXpert MRSA Assay (FDA approved for NASAL specimens only), is one component of a comprehensive MRSA  colonization surveillance program. It is not intended to diagnose MRSA infection nor to guide or monitor treatment for MRSA infections. Performed at [facility], [address], [place], [place] [address]      Labs: Basic Metabolic Panel: Recent Labs  Lab 01/16/18 2029 01/17/18 0432 01/18/18 0424  NA 143 138 139  K 4.0 3.7 4.1  CL 105 105 104  CO2 24 22 25   GLUCOSE 129* 117* 117*  BUN 25* 19 18  CREATININE 1.02* 0.91 1.01*  CALCIUM 9.0 8.7* 8.9   Liver Function Tests: Recent Labs  Lab 01/16/18 2029  AST 26  ALT 29  ALKPHOS 115  BILITOT 0.6  PROT 6.8  ALBUMIN 3.7   No results for input(s): LIPASE, AMYLASE in the last 168 hours. No results for input(s): AMMONIA in the last 168 hours. CBC: Recent Labs  Lab 01/16/18 2029 01/17/18 0432 01/18/18 0424 01/19/18 0407  WBC 13.5* 13.3* 9.3 10.1  NEUTROABS 5.9  --   --   --   HGB 14.4 13.0 13.5 12.5  HCT 45.4 40.9 43.4 40.2  MCV 90.8 88.9 90.6 90.5  PLT 325 323 294 295   Cardiac Enzymes: Recent Labs  Lab 01/16/18 2029 01/16/18 2252 01/17/18 0432 01/17/18 1125  CKTOTAL  --  31*  --   --   TROPONINI 0.07* 0.08* 0.07* 0.07*   BNP: BNP (last 3 results) No results for input(s): BNP in the last 8760 hours.  ProBNP (last 3 results) No results for input(s): PROBNP in the last 8760 hours.  CBG: Recent Labs  Lab 01/18/18 0758 01/19/18 0755  GLUCAP 106* 110*       Signed:  Lelon Frohlich  [facility] Pager: [phone] 01/19/2018, 10:31 AM

## 2018-01-19 NOTE — Progress Notes (Signed)
SATURATION QUALIFICATIONS: (This note is used to comply with regulatory documentation for home oxygen)  Patient Saturations on Room Air at Rest = 95%  Patient Saturations on Room Air while Ambulating = 94%  Patient Saturations on 2 Liters of oxygen while Ambulating = N/A  Please briefly explain why patient needs home oxygen: Pt's saturations remained above 94% on room air while ambulating, no oxygen required.  Celestia Khat, RN

## 2018-01-21 ENCOUNTER — Ambulatory Visit: Payer: Self-pay | Admitting: Family

## 2018-01-21 LAB — PROTHROMBIN GENE MUTATION

## 2018-01-24 LAB — FACTOR 5 LEIDEN

## 2018-01-26 ENCOUNTER — Encounter: Payer: Self-pay | Admitting: Family

## 2018-01-26 ENCOUNTER — Ambulatory Visit (INDEPENDENT_AMBULATORY_CARE_PROVIDER_SITE_OTHER): Payer: PPO | Admitting: Family

## 2018-01-26 VITALS — BP 133/77 | HR 90 | Temp 97.3°F | Ht 65.0 in | Wt 227.0 lb

## 2018-01-26 DIAGNOSIS — I2699 Other pulmonary embolism without acute cor pulmonale: Secondary | ICD-10-CM | POA: Diagnosis not present

## 2018-01-26 DIAGNOSIS — Z09 Encounter for follow-up examination after completed treatment for conditions other than malignant neoplasm: Secondary | ICD-10-CM

## 2018-01-26 DIAGNOSIS — R0602 Shortness of breath: Secondary | ICD-10-CM

## 2018-01-26 MED ORDER — APIXABAN 5 MG PO TABS: 5.0000 mg | ORAL_TABLET | Freq: Two times a day (BID) | ORAL | 2 refills | Status: DC

## 2018-01-26 NOTE — Progress Notes (Signed)
   Subjective:    Patient ID: Lori Cooper, female    DOB: [birth year], 62 y.o.   MRN: [id number]  HPI Pt presents to the office today for hospital follow up. Pt went to the ED on 01/16/18 for SOB and was diagnosed with bilateral PE's.   Pt was discharged home on Eliquis. Will need to continue this medication for 9-12 months. This is patient's first PE or DVT. However, states her father died of PE.     Pt had negative Venous Dopplers  for DVT and Echocardiogram shows an ejection fraction of 60-65% with no wall motion abnormalities, moderately dilated RV with a pulmonary artery systolic pressure of 45.  Pt had hypercoagulable panel drawn in hospital. All negative and have discussed these results with patient.   Review of Systems  Respiratory: Negative for shortness of breath.   Cardiovascular: Negative for chest pain and leg swelling.  All other systems reviewed and are negative.      Objective:   Physical Exam  Constitutional: She is oriented to person, place, and time. She appears well-developed and well-nourished. No distress.  HENT:  Head: Normocephalic and atraumatic.  Right Ear: External ear normal.  Mouth/Throat: Oropharynx is clear and moist.  Eyes: Pupils are equal, round, and reactive to light.  Neck: Normal range of motion. Neck supple. No thyromegaly present.  Cardiovascular: Normal rate, regular rhythm, normal heart sounds and intact distal pulses.  No murmur heard. Pulmonary/Chest: Effort normal and breath sounds normal. No respiratory distress. She has no wheezes.  Abdominal: Soft. Bowel sounds are normal. She exhibits no distension. There is no tenderness.  Musculoskeletal: Normal range of motion. She exhibits no edema or tenderness.  Neurological: She is alert and oriented to person, place, and time.  Skin: Skin is warm and dry.  Psychiatric: She has a normal mood and affect. Her behavior is normal. Judgment and thought content normal.  Vitals reviewed.     BP  133/77   Pulse 90   Temp (!) 97.3 F (36.3 C) (Oral)   Ht 5\' 5"  (1.651 m)   Wt 227 lb (103 kg)   BMI 37.77 kg/m      Assessment & Plan:  1. Other acute pulmonary embolism without acute cor pulmonale ([place])  2. Hospital discharge follow-up   Will reorder Eliquis, coupon card given Discussed importance of taking everyday for the next year Labs discussed that were drawn at hospital  RTO prn and keep chronic follow up    Evelina Dun, FNP

## 2018-01-26 NOTE — Patient Instructions (Signed)
Pulmonary Embolism A pulmonary embolism (PE) is a sudden blockage or decrease of blood flow in one lung or both lungs. Most blockages come from a blood clot that forms in a lower leg, thigh, or arm vein (deep vein thrombosis, DVT) and travels to the lungs. A clot is blood that has thickened into a gel or solid. PE is a dangerous and life-threatening condition that needs to be treated right away. What are the causes? This condition is usually caused by a blood clot that forms in a vein and moves to the lungs. In rare cases, it may be caused by air, fat, part of a tumor, or other tissue that moves through the veins and into the lungs. What increases the risk? The following factors may make you more likely to develop this condition:  Having DVT or a history of DVT.  Being older than age 41.  Personal or family history of blood clots or blood clotting disease.  Major or lengthy surgery.  Orthopedic surgery, especially hip or knee replacement.  Traumatic injury, such as breaking a hip or leg.  Spinal cord injury.  Stroke.  Taking medicines that contain estrogen. These include birth control pills and hormone replacement therapy.  Long-term (chronic) lung or heart disease.  Cancer and chemotherapy.  Having a central venous catheter.  Pregnancy and the period after delivery.  What are the signs or symptoms? Symptoms of this condition usually start suddenly and include:  Shortness of breath while active or at rest.  Coughing or coughing up blood or blood-tinged mucus.  Chest pain that is often worse with deep breaths.  Rapid or irregular heartbeat.  Feeling light-headed or dizzy.  Fainting.  Feeling anxious.  Sweating.  Pain and swelling in a leg. This is a symptom of DVT, which can lead to PE.  How is this diagnosed? This condition may be diagnosed based on:  Your medical history.  A physical exam.  Blood tests to check blood oxygen level and how well your blood  clots, and a D-dimer blood test, which checks your blood for a substance that is released when a blood clot breaks apart.  CT pulmonary angiogram. This test checks blood flow in and around your lungs.  Ventilation-perfusion scan, also called a lung VQ scan. This test measures air flow and blood flow to the lungs.  Ultrasound of the legs to look for blood clots.  How is this treated? Treatment for this conditions depends on many factors, such as the cause of your PE, your risk for bleeding or developing more clots, and other medical conditions you have. Treatment aims to remove, dissolve, or stop blood clots from forming or growing larger. Treatment may include:  Blood thinning medicines (anticoagulants) to stop clots from forming or growing. These medicines may be given as a pill, as an injection, or through an IV tube (infusion).  Medicines that dissolve clots (thrombolytics).  A procedure in which a flexible tube is used to remove a blood clot (embolectomy) or deliver medicine to destroy it (catheter-directed thrombolysis).  A procedure in which a filter is inserted into a large vein that carries blood to the heart (inferior vena cava). This filter (vena cava filter) catches blood clots before they reach the lungs.  Surgery to remove the clot (surgical embolectomy). This is rare.  You may need a combination of immediate, long-term (up to 3 months after diagnosis), and extended (more than 3 months after diagnosis) treatments. Your treatment may continue for several months (maintenance therapy).  You and your health care provider will work together to choose the treatment program that is best for you. Follow these instructions at home: If you are taking an anticoagulant medicine:  Take the medicine every day at the same time each day.  Understand what foods and drugs interact with your medicine.  Understand the side effects of this medicine, including excessive bruising or bleeding. Ask  your health care provider or pharmacist about other side effects. General instructions  Take over-the-counter and prescription medicines only as told by your health care provider.  Anticoagulant medicines may cause side effects, including easy bruising and difficulty stopping bleeding. If you are prescribed an anticoagulant: ? Hold pressure over cuts for longer than usual. ? Tell your dentist and other health care providers that you are taking anticoagulants before you have any procedure that may cause bleeding. ? Avoid contact sports. ? Be extra careful when handling sharp objects. ? Use a soft toothbrush. Floss with waxed dental floss. ? Shave with an Copy.  Wear a medical alert bracelet or carry a medical alert card that says you have had a PE.  Ask your health care provider when you may return to your normal activities.  Talk with your health care provider about any travel plans. It is important to make sure that you are still able to take your medicine while on trips.  Keep all follow-up visits as told by your health care provider. This is important. How is this prevented? Take these actions to lower your risk of developing another PE:  Exercise regularly. Take frequent walks. For at least 30 minutes every day, engage in: ? Activity that involves moving your arms and legs. ? Activity that encourages good blood flow through your body by increasing your heart rate.  While traveling, drink plenty of water and avoid drinking alcohol. Ask your health care provider if you should wear below-the-knee compression stockings.  Avoid sitting or lying in bed for long periods of time without moving your legs. Exercise your arms and legs every hour during long-distance travel (over 4 hours).  If you are hospitalized or have surgery, ask your health care provider about your risks and what treatments can help prevent blood clots.  Maintain a healthy weight. Ask your health care  provider what weight is healthy for you.  If you are a woman who is over age 47, avoid unnecessary use of medicines that contain estrogen, including birth control pills.  Do not use any products that contain nicotine or tobacco, such as cigarettes and e-cigarettes. This is especially important if you take estrogen medicines. If you need help quitting, ask your health care provider.  See your health care provider for regular checkups. This may include blood tests and ultrasound testing on your legs to check for new blood clots.  Contact a health care provider if:  You missed a dose of your blood thinner medicine. Get help right away if:  You have new or increased pain, swelling, warmth, or redness in an arm or leg.  You have numbness or tingling in an arm or leg.  You have shortness of breath while active or at rest.  You have chest pain.  You have a rapid or irregular heartbeat.  You feel light-headed or dizzy.  You cough up blood.  You have blood in your vomit, stool, or urine.  You have a fever.  You have abdomen (abdominal) pain.  You have a severe fall or head injury.  You have a  severe headache.  You have vision changes.  You cannot move your arms or legs.  You are confused or have memory loss.  You are bleeding for 10 minutes or more, even with strong pressure on the wound. These symptoms may represent a serious problem that is an emergency. Do not wait to see if the symptoms will go away. Get medical help right away. Call your local emergency services (911 in the [place] Do not drive yourself to the hospital. Summary  A pulmonary embolism (PE) is a sudden blockage or decrease of blood flow in one lung or both lungs. PE is a dangerous and life-threatening condition that needs to be treated right away.  Having deep vein thrombosis (DVT) or a history of DVT is the most common risk factor for PE.  Treatments for this condition usually include medicines to thin  your blood (anticoagulants) or medicines to break apart blood clots (thrombolytics).  If you are prescribed blood thinners, it is important to take the medicine every single day at the same time each day.  If you have signs of PE or DVT, call your local emergency services (911 in the [place] This information is not intended to replace advice given to you by your health care provider. Make sure you discuss any questions you have with your health care provider. Document Released: 10/30/2000 Document Revised: 12/05/2016 Document Reviewed: 12/05/2016 Elsevier Interactive Patient Education  2018 [employer].

## 2018-01-27 LAB — CMP14+EGFR
ALT: 25 IU/L (ref 0–32)
AST: 27 IU/L (ref 0–40)
Albumin/Globulin Ratio: 1.7 (ref 1.2–2.2)
Albumin: 4.4 g/dL (ref 3.6–4.8)
Alkaline Phosphatase: 130 IU/L — ABNORMAL HIGH (ref 39–117)
BUN/Creatinine Ratio: 11 — ABNORMAL LOW (ref 12–28)
BUN: 9 mg/dL (ref 8–27)
Bilirubin Total: 0.4 mg/dL (ref 0.0–1.2)
CO2: 21 mmol/L (ref 20–29)
Calcium: 9.7 mg/dL (ref 8.7–10.3)
Chloride: 101 mmol/L (ref 96–106)
Creatinine, Ser: 0.85 mg/dL (ref 0.57–1.00)
GFR calc Af Amer: 84 mL/min/{1.73_m2} (ref >59)
GFR calc non Af Amer: 73 mL/min/{1.73_m2} (ref >59)
Globulin, Total: 2.6 g/dL (ref 1.5–4.5)
Glucose: 104 mg/dL — ABNORMAL HIGH (ref 65–99)
Potassium: 4.9 mmol/L (ref 3.5–5.2)
Sodium: 140 mmol/L (ref 134–144)
Total Protein: 7 g/dL (ref 6.0–8.5)

## 2018-01-27 LAB — CBC WITH DIFFERENTIAL/PLATELET
Basophils Absolute: 0.1 10*3/uL (ref 0.0–0.2)
Basos: 1 %
EOS (ABSOLUTE): 0.1 10*3/uL (ref 0.0–0.4)
Eos: 2 %
Hematocrit: 42.9 % (ref 34.0–46.6)
Hemoglobin: 14.2 g/dL (ref 11.1–15.9)
Immature Grans (Abs): 0 10*3/uL (ref 0.0–0.1)
Immature Granulocytes: 0 %
Lymphocytes Absolute: 2 10*3/uL (ref 0.7–3.1)
Lymphs: 26 %
MCH: 28.8 pg (ref 26.6–33.0)
MCHC: 33.1 g/dL (ref 31.5–35.7)
MCV: 87 fL (ref 79–97)
Monocytes Absolute: 0.5 10*3/uL (ref 0.1–0.9)
Monocytes: 7 %
Neutrophils Absolute: 4.9 10*3/uL (ref 1.4–7.0)
Neutrophils: 64 %
Platelets: 330 10*3/uL (ref 150–379)
RBC: 4.93 x10E6/uL (ref 3.77–5.28)
RDW: 13.6 % (ref 12.3–15.4)
WBC: 7.6 10*3/uL (ref 3.4–10.8)

## 2018-02-03 ENCOUNTER — Ambulatory Visit: Payer: PPO | Admitting: Family Medicine

## 2018-02-08 ENCOUNTER — Telehealth: Payer: Self-pay | Admitting: Family

## 2018-02-08 DIAGNOSIS — Z86711 Personal history of pulmonary embolism: Secondary | ICD-10-CM

## 2018-02-08 NOTE — Telephone Encounter (Signed)
Patient would like to be referred to a pulmonologist, Dr. Melvyn Novas, with [facility] to be evaluated further.  She reports her sister has the same problem and she is a patient of his.  Please advise.

## 2018-02-08 NOTE — Telephone Encounter (Signed)
She wants to discuss with them what happened and see if there has been any damage to her lungs.

## 2018-02-08 NOTE — Telephone Encounter (Signed)
What does she need to see Pulmonologist for?

## 2018-02-08 NOTE — Telephone Encounter (Signed)
Referral to Pulmonologist ordered

## 2018-02-17 ENCOUNTER — Encounter: Payer: Self-pay | Admitting: Family

## 2018-02-18 ENCOUNTER — Encounter: Payer: Self-pay | Admitting: Family

## 2018-02-18 ENCOUNTER — Ambulatory Visit: Payer: PPO | Admitting: Family

## 2018-02-18 ENCOUNTER — Ambulatory Visit (INDEPENDENT_AMBULATORY_CARE_PROVIDER_SITE_OTHER): Payer: PPO | Admitting: Family

## 2018-02-18 VITALS — BP 132/77 | HR 88 | Temp 97.0°F | Ht 65.0 in | Wt 233.0 lb

## 2018-02-18 DIAGNOSIS — R05 Cough: Secondary | ICD-10-CM | POA: Diagnosis not present

## 2018-02-18 DIAGNOSIS — J189 Pneumonia, unspecified organism: Secondary | ICD-10-CM | POA: Diagnosis not present

## 2018-02-18 DIAGNOSIS — R059 Cough, unspecified: Secondary | ICD-10-CM

## 2018-02-18 MED ORDER — BENZONATATE 200 MG PO CAPS: 200.0000 mg | ORAL_CAPSULE | Freq: Three times a day (TID) | ORAL | 1 refills | Status: DC | PRN

## 2018-02-18 MED ORDER — METHYLPREDNISOLONE ACETATE 80 MG/ML IJ SUSP
80.0000 mg | Freq: Once | INTRAMUSCULAR | Status: AC
  Administered 2018-02-18: 80 mg via INTRAMUSCULAR

## 2018-02-18 MED ORDER — PREDNISONE 10 MG (21) PO TBPK: ORAL_TABLET | ORAL | 0 refills | Status: DC

## 2018-02-18 MED ORDER — HYDROCODONE-HOMATROPINE 5-1.5 MG/5ML PO SYRP: 5.0000 mL | ORAL_SOLUTION | Freq: Three times a day (TID) | ORAL | 0 refills | Status: DC | PRN

## 2018-02-18 MED ORDER — DOXYCYCLINE HYCLATE 100 MG PO TABS: 100.0000 mg | ORAL_TABLET | Freq: Two times a day (BID) | ORAL | 0 refills | Status: DC

## 2018-02-18 MED ORDER — AZITHROMYCIN 250 MG PO TABS: ORAL_TABLET | ORAL | 0 refills | Status: DC

## 2018-02-18 NOTE — Patient Instructions (Signed)

## 2018-02-18 NOTE — Progress Notes (Signed)
Subjective:    Patient ID: Lori Cooper, female    DOB: 12-18-1955, 61 y.o.   MRN: [id number]  Cough  This is a new problem. The current episode started in the past 7 days. The problem has been waxing and waning. The problem occurs every few minutes. The cough is non-productive. Associated symptoms include headaches, myalgias, nasal congestion, postnasal drip and wheezing. Pertinent negatives include no chills, ear congestion, ear pain, fever, rhinorrhea, sore throat or shortness of breath. The symptoms are aggravated by lying down. She has tried rest for the symptoms. The treatment provided mild relief.      Review of Systems  Constitutional: Negative for chills and fever.  HENT: Positive for postnasal drip. Negative for ear pain, rhinorrhea and sore throat.   Respiratory: Positive for cough and wheezing. Negative for shortness of breath.   Musculoskeletal: Positive for myalgias.  Neurological: Positive for headaches.  All other systems reviewed and are negative.      Objective:   Physical Exam  Constitutional: She is oriented to person, place, and time. She appears well-developed and well-nourished. No distress.  HENT:  Head: Normocephalic and atraumatic.  Right Ear: External ear normal.  Mouth/Throat: Posterior oropharyngeal erythema present.  Eyes: Pupils are equal, round, and reactive to light.  Neck: Normal range of motion. Neck supple. No thyromegaly present.  Cardiovascular: Normal rate, regular rhythm, normal heart sounds and intact distal pulses.  No murmur heard. Pulmonary/Chest: Effort normal. No respiratory distress. She has wheezes. She has rhonchi.  Abdominal: Soft. Bowel sounds are normal. She exhibits no distension. There is no tenderness.  Musculoskeletal: Normal range of motion. She exhibits no edema or tenderness.  Neurological: She is alert and oriented to person, place, and time. She has normal reflexes. No cranial nerve deficit.  Skin: Skin is warm and dry.    Psychiatric: She has a normal mood and affect. Her behavior is normal. Judgment and thought content normal.  Vitals reviewed.     BP 132/77   Pulse 88   Temp (!) 97 F (36.1 C) (Oral)   Ht 5\' 5"  (1.651 m)   Wt 233 lb (105.7 kg)   BMI 38.77 kg/m      Assessment & Plan:  1. Community acquired pneumonia, unspecified laterality - Take meds as prescribed - Use a cool mist humidifier  -Use saline nose sprays frequently -Force fluids -For any cough or congestion  Use plain Mucinex- regular strength or max strength is fine -For fever or aces or pains- take tylenol or ibuprofen. -Throat lozenges if help  - predniSONE (STERAPRED UNI-PAK 21 TAB) 10 MG (21) TBPK tablet; Use as directed  Dispense: 21 tablet; Refill: 0 - methylPREDNISolone acetate (DEPO-MEDROL) injection 80 mg - HYDROcodone-homatropine (HYCODAN) 5-1.5 MG/5ML syrup; Take 5 mLs by mouth every 8 (eight) hours as needed for cough.  Dispense: 120 mL; Refill: 0 - benzonatate (TESSALON) 200 MG capsule; Take 1 capsule (200 mg total) by mouth 3 (three) times daily as needed.  Dispense: 30 capsule; Refill: 1 - azithromycin (ZITHROMAX) 250 MG tablet; Take 500 mg once, then 250 mg for four days  Dispense: 6 tablet; Refill: 0  2. Cough - predniSONE (STERAPRED UNI-PAK 21 TAB) 10 MG (21) TBPK tablet; Use as directed  Dispense: 21 tablet; Refill: 0 - methylPREDNISolone acetate (DEPO-MEDROL) injection 80 mg - HYDROcodone-homatropine (HYCODAN) 5-1.5 MG/5ML syrup; Take 5 mLs by mouth every 8 (eight) hours as needed for cough.  Dispense: 120 mL; Refill: 0 - benzonatate (TESSALON) 200 MG capsule;  Take 1 capsule (200 mg total) by mouth 3 (three) times daily as needed.  Dispense: 30 capsule; Refill: 1 - azithromycin (ZITHROMAX) 250 MG tablet; Take 500 mg once, then 250 mg for four days  Dispense: 6 tablet; Refill: 0   Evelina Dun, FNP

## 2018-02-22 ENCOUNTER — Other Ambulatory Visit: Payer: Self-pay | Admitting: Family

## 2018-02-22 DIAGNOSIS — K2100 Gastro-esophageal reflux disease with esophagitis, without bleeding: Secondary | ICD-10-CM

## 2018-03-09 ENCOUNTER — Ambulatory Visit: Payer: PPO | Admitting: Internal Medicine

## 2018-03-09 ENCOUNTER — Encounter: Payer: Self-pay | Admitting: Internal Medicine

## 2018-03-09 VITALS — BP 128/80 | HR 94 | Ht 65.0 in | Wt 231.0 lb

## 2018-03-09 DIAGNOSIS — R0609 Other forms of dyspnea: Secondary | ICD-10-CM | POA: Diagnosis not present

## 2018-03-09 DIAGNOSIS — R05 Cough: Secondary | ICD-10-CM | POA: Diagnosis not present

## 2018-03-09 DIAGNOSIS — I2609 Other pulmonary embolism with acute cor pulmonale: Secondary | ICD-10-CM | POA: Diagnosis not present

## 2018-03-09 DIAGNOSIS — R058 Other specified cough: Secondary | ICD-10-CM

## 2018-03-09 DIAGNOSIS — R0600 Dyspnea, unspecified: Secondary | ICD-10-CM

## 2018-03-09 MED ORDER — FAMOTIDINE 20 MG PO TABS: ORAL_TABLET | ORAL | 2 refills | Status: DC

## 2018-03-09 NOTE — Progress Notes (Signed)
Subjective:     Patient ID: Lori Cooper, female   DOB: 1956/02/24, 62 y.o.   MRN: [id number]  HPI  1 yowf  Quit smoking 2014  @  89 slowed down by bad back/ required abd mesh for abd wall hernia 2/018 and very sedentary then feb 2019 pain behind R leg/ sob/ no cp    Admit date: 01/16/2018 Discharge date: 01/19/2018     Recommendations for Outpatient Follow-up:  -To be discharged home today. -We will need to take Eliquis for at least 9-12 months:  10 mg twice a day for 14 days and then reduce dose to 5 mg daily. -Results of hypercoagulable panel need to be followed at time of hospital follow-up.  Discharge Diagnoses:  Principal Problem:   Pulmonary embolism with acute cor pulmonale (HCC) Active Problems:   HLD (hyperlipidemia)   GERD (gastroesophageal reflux disease)   Generalized anxiety disorder   Restless legs   Depression   Pulmonary emboli (HCC)     History of present illness:  As per Dr. Manuella Ghazi on 3/3:  Lori Cooper a 62 y.o.femalewith medical history significant foranxiety/depression, arthritis, GERD, dyslipidemia, and restless leg syndrome who has developed worsening shortness of breath over the last 1 week. She went to see her primary care provider this past Monday and was given a prednisone taper as well as some doxycycline and chest x-ray findings at that time did not demonstrate any acute abnormalities. Despite taking these medications, she continued to have ongoing shortness of breath, particularly with exertion and she noted some lightheadedness with it as well. She states that she has also had some right-sided thigh pain as well as some on the left side that developed a little later. She denies any significant swelling to her legs. She denies any recent immobilization, travel, or surgeries and states that her last major surgery was abdominal mesh placement about a year ago as well as some dental surgery in September 2018. She denies chest pain or  palpitations. She currently denies any dyspnea, cough, sputum production, fever, or chills.  ED Course:Vital signs are noted to be stable with some soft blood pressure readings, but systolic blood pressure remains in the low 100 range. EKG demonstrates sinus rhythm and she is currently approximately 90-100 bpm on telemetry. Troponin is mildly elevated at 0.07. CT of the chest with contrast demonstrates large bilateral PE that is characterized as submassive with RV strain noted. Patient was actually saturating 93% on room air with no respiratory distress and has just now been placed on 2 L nasal cannula. Heparin drip has been ordered.    Hospital Course:   Acute submassive PE -Unprovoked by clinical history, patient states her father died from a fatal PE. -Venous Dopplers are negative for DVT. -Echocardiogram shows an ejection fraction of 60-65% with no wall motion abnormalities, moderately dilated RV with a pulmonary artery systolic pressure of 45. -Continue apixaban, case management has reviewed eligibility. -Hypercoagulable panel specifically prothrombin gene mutation, lupus anticoagulant and factor V Leiden neg/ note father also had a large in his case fatal PE.  Acute hypoxemic respiratory failure -Secondary to large PE in setting of COPD. -Currently does not have oxygen requirements.  Elevated troponin -Secondary to large PE, trend is flat, denies chest pain, echocardiogram with normal EF and no wall motion abnormalities, no further cardiac workup at present.  Hyperlipidemia -Restart statin -CPK 31  Depression/Anxiety -continueWellbutrin and fluoxetine  GERD -Continue PPI         03/09/2018 1st [facility]  office visit/ Writer Complaint  Patient presents with  . Pulmonary Consult    Referred by Evelina Dun, NP. Pt dxed with bilateral PE 01/16/18- started on Eliquis. She states since starting treatment the SOB she was having has improved  almost back to her normal baseline. She does c/o occ cough with clear sputum- wakes her up in the night "tickles in throat". She has an albuterol inhaler that she rarely uses.   doe x 50 ft vs walked up 40 min around the neighborhood included mild inclines prior to PE  No leg swelling or pains now  Chronically has to sleep 30 degrees due to gerd    No obvious day to day or daytime variability or assoc excess/ purulent sputum or mucus plugs or hemoptysis or cp or chest tightness, subjective wheeze or overt   hb symptoms. No unusual exposure hx or h/o childhood pna/ asthma or knowledge of premature birth.  Sleeping  At 30 degrees   without nocturnal  or early am exacerbation  of respiratory  c/o's or need for noct saba. Also denies any obvious fluctuation of symptoms with weather or environmental changes or other aggravating or alleviating factors except as outlined above   Current Allergies, Complete Past Medical History, Past Surgical History, Family History, and Social History were reviewed in [facility] record.  ROS  The following are not active complaints unless bolded Hoarseness, sore throat, dysphagia, dental problems, itching, sneezing,  nasal congestion or discharge of excess mucus or purulent secretions, ear ache,   fever, chills, sweats, unintended wt loss or wt gain, classically pleuritic or exertional cp,  orthopnea pnd or arm/hand swelling  or leg swelling, presyncope, palpitations, abdominal pain, anorexia, nausea, vomiting, diarrhea  or change in bowel habits or change in bladder habits, change in stools or change in urine, dysuria, hematuria,  rash, arthralgias, visual complaints, headache, numbness, weakness or ataxia or problems with walking or coordination,  change in mood or  memory.        Current Meds  Medication Sig  . albuterol (PROVENTIL HFA;VENTOLIN HFA) 108 (90 Base) MCG/ACT inhaler Inhale 2 puffs into the lungs every 6 (six) hours as needed for  wheezing or shortness of breath.  Marland Kitchen apixaban (ELIQUIS) 5 MG TABS tablet Take 1 tablet (5 mg total) by mouth 2 (two) times daily.  Marland Kitchen atorvastatin (LIPITOR) 40 MG tablet TAKE 1 TABLET BY MOUTH ONCE DAILY  . buPROPion (WELLBUTRIN XL) 150 MG 24 hr tablet TAKE 1 TABLET BY MOUTH ONCE DAILY  . cyclobenzaprine (FLEXERIL) 10 MG tablet TAKE 1 TABLET BY MOUTH THREE TIMES DAILY AS NEEDED FOR MUSCLE SPASM  . DEXILANT 60 MG capsule TAKE 1 CAPSULE BY MOUTH ONCE DAILY  . FLUoxetine (PROZAC) 40 MG capsule TAKE 2 CAPSULES BY MOUTH ONCE DAILY  . HYDROcodone-acetaminophen (NORCO) 10-325 MG tablet Take 1 tablet by mouth every 6 (six) hours as needed.   Marland Kitchen LORazepam (ATIVAN) 0.5 MG tablet TAKE 1 TABLET BY MOUTH EVERY 8 HOURS AS NEEDED         Review of Systems     Objective:   Physical Exam   Pleasant amb mod obese wf nad   Wt Readings from Last 3 Encounters:  03/09/18 231 lb (104.8 kg)  02/18/18 233 lb (105.7 kg)  01/26/18 227 lb (103 kg)     Vital signs reviewed - Note on arrival 02 sats  94% on RA         HEENT: nl  urbinates bilaterally, and oropharynx. Nl external ear canals without cough reflex - full dentures    NECK :  without JVD/Nodes/TM/ nl carotid upstrokes bilaterally   LUNGS: no acc muscle use,  Nl contour chest which is clear to A and P bilaterally without cough on insp or exp maneuvers   CV:  RRR  no s3 or murmur or increase in P2, and no edema   ABD:  soft and nontender with nl inspiratory excursion in the supine position. No bruits or organomegaly appreciated, bowel sounds nl  MS:  Nl gait/ ext warm without deformities, calf tenderness, cyanosis or clubbing No obvious joint restrictions   SKIN: warm and dry without lesions    NEURO:  alert, approp, nl sensorium with  no motor or cerebellar deficits apparent.               Assessment:

## 2018-03-09 NOTE — Patient Instructions (Addendum)
Change dexilant to where you take  It Take 30- 60 min before your first  meals of the day and add pepcid 20 mg one hour before bedtime   GERD (REFLUX)  is an extremely common cause of respiratory symptoms just like yours , many times with no obvious heartburn at all.    It can be treated with medication, but also with lifestyle changes including elevation of the head of your bed (ideally with 6 inch  bed blocks),  Smoking cessation, avoidance of late meals, excessive alcohol, and avoid fatty foods, chocolate, peppermint, colas, red wine, and acidic juices such as orange juice.  NO MINT OR MENTHOL PRODUCTS SO NO COUGH DROPS  USE SUGARLESS CANDY INSTEAD (Jolley ranchers or Stover's or Life Savers) or even ice chips will also do - the key is to swallow to prevent all throat clearing. NO OIL BASED VITAMINS - use powdered substitutes.    For drainage / throat tickle try take CHLORPHENIRAMINE  4 mg - take one every 4 hours as needed - available over the counter- may cause drowsiness so start with just a bedtime dose or two and see how you tolerate it before trying in daytime       To get the most out of exercise, you need to be continuously aware that you are short of breath, but never out of breath, for 30 minutes daily. As you improve, it will actually be easier for you to do the same amount of exercise  in  30 minutes so always push to the level where you are short of breath - the key is to pace yourself    Please schedule a follow up office visit in 6 weeks, call sooner if needed with echocardiogram and pfts on return

## 2018-03-10 ENCOUNTER — Encounter: Payer: Self-pay | Admitting: Internal Medicine

## 2018-03-10 DIAGNOSIS — R0600 Dyspnea, unspecified: Secondary | ICD-10-CM | POA: Insufficient documentation

## 2018-03-10 DIAGNOSIS — R05 Cough: Secondary | ICD-10-CM | POA: Insufficient documentation

## 2018-03-10 DIAGNOSIS — R058 Other specified cough: Secondary | ICD-10-CM | POA: Insufficient documentation

## 2018-03-10 DIAGNOSIS — R0609 Other forms of dyspnea: Secondary | ICD-10-CM

## 2018-03-10 NOTE — Assessment & Plan Note (Addendum)
Rec noct pepcid and 1st gen H1 blockers per guidelines  03/09/2018

## 2018-03-10 NOTE — Assessment & Plan Note (Addendum)
03/09/2018  Walked RA x 3 laps @ 185 ft each stopped due to  End of study, nl to moderately fast pace, no desat / min sob      Improving back to baseline > no rx needed other than to resume regular paced submax ex

## 2018-03-11 ENCOUNTER — Ambulatory Visit (HOSPITAL_COMMUNITY): Payer: PPO | Attending: Cardiovascular Disease

## 2018-03-11 ENCOUNTER — Other Ambulatory Visit: Payer: Self-pay

## 2018-03-11 DIAGNOSIS — J449 Chronic obstructive pulmonary disease, unspecified: Secondary | ICD-10-CM | POA: Insufficient documentation

## 2018-03-11 DIAGNOSIS — E785 Hyperlipidemia, unspecified: Secondary | ICD-10-CM | POA: Insufficient documentation

## 2018-03-11 DIAGNOSIS — R0600 Dyspnea, unspecified: Secondary | ICD-10-CM

## 2018-03-11 DIAGNOSIS — Z86711 Personal history of pulmonary embolism: Secondary | ICD-10-CM | POA: Diagnosis not present

## 2018-03-11 DIAGNOSIS — Z6838 Body mass index (BMI) 38.0-38.9, adult: Secondary | ICD-10-CM | POA: Diagnosis not present

## 2018-03-11 DIAGNOSIS — R0609 Other forms of dyspnea: Secondary | ICD-10-CM | POA: Insufficient documentation

## 2018-03-11 NOTE — Progress Notes (Signed)
LMTCB

## 2018-03-11 NOTE — Progress Notes (Signed)
Spoke with pt and notified of results per Dr. Wert. Pt verbalized understanding and denied any questions. 

## 2018-03-11 NOTE — Assessment & Plan Note (Signed)
Dx 01/16/18 -Venous Dopplers  negative for DVT. -Echocardiogram shows an ejection fraction of 60-65% with no wall motion abnormalities, moderately dilated RV with a pulmonary artery systolic pressure of 45. -prothrombin gene mutation, lupus anticoagulant and factor V Leiden neg/ note father also had a large in his case fatal PE    Based on severity of blood clot and absence of obvious risk factors other than fm hx and body habitus (which are not likely to change) I rec  1) One year of DOAC then change to the half dose for lifetime prophylaxis  2) resume paced sub max ex  3) Echo in 6 weeks which will be about 3 months since event and should be normalizing by then   Total time devoted to counseling  > 50 % of initial 60 min office visit:  review case with pt/ discussion of options/alternatives/ personally creating written customized instructions  in presence of pt  then going over those specific  Instructions directly with the pt including how to use all of the meds but in particular covering each new medication in detail and the difference between the maintenance= "automatic" meds and the prns using an action plan format for the latter (If this problem/symptom => do that organization reading Left to right).  Please see AVS from this visit for a full list of these instructions which I personally wrote for this pt and  are unique to this visit.

## 2018-03-12 ENCOUNTER — Other Ambulatory Visit: Payer: Self-pay | Admitting: Family Medicine

## 2018-03-12 DIAGNOSIS — M5441 Lumbago with sciatica, right side: Secondary | ICD-10-CM

## 2018-03-14 NOTE — Telephone Encounter (Signed)
Last seen 4.5/19  [facility]

## 2018-03-22 ENCOUNTER — Other Ambulatory Visit: Payer: Self-pay | Admitting: Family

## 2018-03-22 DIAGNOSIS — F32A Depression, unspecified: Secondary | ICD-10-CM

## 2018-03-22 DIAGNOSIS — F411 Generalized anxiety disorder: Secondary | ICD-10-CM

## 2018-03-22 DIAGNOSIS — F329 Major depressive disorder, single episode, unspecified: Secondary | ICD-10-CM

## 2018-03-24 ENCOUNTER — Telehealth: Payer: Self-pay | Admitting: Family

## 2018-04-13 ENCOUNTER — Other Ambulatory Visit: Payer: Self-pay | Admitting: Family

## 2018-04-13 DIAGNOSIS — M5441 Lumbago with sciatica, right side: Secondary | ICD-10-CM

## 2018-04-20 ENCOUNTER — Encounter: Payer: Self-pay | Admitting: Internal Medicine

## 2018-04-20 ENCOUNTER — Ambulatory Visit: Payer: PPO | Admitting: Internal Medicine

## 2018-04-20 VITALS — BP 132/84 | HR 87 | Ht 65.0 in | Wt 235.6 lb

## 2018-04-20 DIAGNOSIS — R0600 Dyspnea, unspecified: Secondary | ICD-10-CM

## 2018-04-20 DIAGNOSIS — R0609 Other forms of dyspnea: Secondary | ICD-10-CM

## 2018-04-20 DIAGNOSIS — R058 Other specified cough: Secondary | ICD-10-CM

## 2018-04-20 DIAGNOSIS — R05 Cough: Secondary | ICD-10-CM

## 2018-04-20 DIAGNOSIS — I2609 Other pulmonary embolism with acute cor pulmonale: Secondary | ICD-10-CM | POA: Diagnosis not present

## 2018-04-20 NOTE — Patient Instructions (Addendum)
Fine with me if your primary care doctor takes over on your eliquis but for now I recommend:  1) full dose eliquis unitl 01/2019 then then half strength for life  2) if want to stop eliquis at any point then see a hematologist for a second opinion risk vs benefit - minor injections you can stop for 3 doses   Pulmonary follow up is as needed

## 2018-04-20 NOTE — Progress Notes (Signed)
Subjective:     Patient ID: Lori Cooper, female   DOB: 07-29-1956, 62 y.o.   MRN: [id number]  HPI  55 yowf  Quit smoking 2014  @  89 slowed down by bad back/ required abd mesh for abd wall hernia 2/018 and very sedentary then feb 2019 pain behind R leg/ sob/ no cp    Admit date: 01/16/2018 Discharge date: 01/19/2018     Recommendations for Outpatient Follow-up:  -To be discharged home today. -We will need to take Eliquis for at least 9-12 months:  10 mg twice a day for 14 days and then reduce dose to 5 mg daily. -Results of hypercoagulable panel need to be followed at time of hospital follow-up.  Discharge Diagnoses:  Principal Problem:   Pulmonary embolism with acute cor pulmonale (HCC) Active Problems:   HLD (hyperlipidemia)   GERD (gastroesophageal reflux disease)   Generalized anxiety disorder   Restless legs   Depression   Pulmonary emboli (HCC)     History of present illness:  As per Dr. Sherryll Burger on 3/3:  Lori Cooper a 62 y.o.femalewith medical history significant foranxiety/depression, arthritis, GERD, dyslipidemia, and restless leg syndrome who has developed worsening shortness of breath over the last 1 week. She went to see her primary care provider this past Monday and was given a prednisone taper as well as some doxycycline and chest x-ray findings at that time did not demonstrate any acute abnormalities. Despite taking these medications, she continued to have ongoing shortness of breath, particularly with exertion and she noted some lightheadedness with it as well. She states that she has also had some right-sided thigh pain as well as some on the left side that developed a little later. She denies any significant swelling to her legs. She denies any recent immobilization, travel, or surgeries and states that her last major surgery was abdominal mesh placement about a year ago as well as some dental surgery in September 2018. She denies chest pain or  palpitations. She currently denies any dyspnea, cough, sputum production, fever, or chills.  ED Course:Vital signs are noted to be stable with some soft blood pressure readings, but systolic blood pressure remains in the low 100 range. EKG demonstrates sinus rhythm and she is currently approximately 90-100 bpm on telemetry. Troponin is mildly elevated at 0.07. CT of the chest with contrast demonstrates large bilateral PE that is characterized as submassive with RV strain noted. Patient was actually saturating 93% on room air with no respiratory distress and has just now been placed on 2 L nasal cannula. Heparin drip has been ordered.    Hospital Course:   Acute submassive PE -Unprovoked by clinical history, patient states her father died from a fatal PE. -Venous Dopplers are negative for DVT. -Echocardiogram shows an ejection fraction of 60-65% with no wall motion abnormalities, moderately dilated RV with a pulmonary artery systolic pressure of 45. -Continue apixaban, case management has reviewed eligibility. -Hypercoagulable panel specifically prothrombin gene mutation, lupus anticoagulant and factor V Leiden neg/ note father also had a large in his case fatal PE.  Acute hypoxemic respiratory failure -Secondary to large PE in setting of COPD. -Currently does not have oxygen requirements.  Elevated troponin -Secondary to large PE, trend is flat, denies chest pain, echocardiogram with normal EF and no wall motion abnormalities, no further cardiac workup at present.  Hyperlipidemia -Restart statin -CPK 31  Depression/Anxiety -continueWellbutrin and fluoxetine  GERD -Continue PPI         03/09/2018 1st [facility]  office visit/ Engineer, drilling Complaint  Patient presents with  . Pulmonary Consult    Referred by Jannifer Rodney, NP. Pt dxed with bilateral PE 01/16/18- started on Eliquis. She states since starting treatment the SOB she was having has improved  almost back to her normal baseline. She does c/o occ cough with clear sputum- wakes her up in the night "tickles in throat". She has an albuterol inhaler that she rarely uses.   doe x 50 ft vs walked up 40 min around the neighborhood included mild inclines prior to PE  No leg swelling or pains now  Chronically has to sleep 30 degrees due to gerd  rec Change dexilant to where you take  It Take 30- 60 min before your first  meals of the day and add pepcid 20 mg one hour before bedtime GERD diet   For drainage / throat tickle try take CHLORPHENIRAMINE  4 mg   04/20/2018  f/u ov/Ladarrious Kirksey re: final summary ov  Chief Complaint  Patient presents with  . Follow-up    Breathing is unchanged. She states she is coughing less and not producing any sputum. She has not had to use her albuterol inhaler.    Dyspnea:  Gradually improving back to baseline = MMRC1 = can walk nl pace, flat grade, can't hurry or go uphills or steps s sob   Cough: min dry daytime with throat tickle, not bad enough to take 1st gen H1 blockers per guidelines   Sleep: fine at 30 degrees SABA use:  None  No obvious day to day or daytime variability or assoc excess/ purulent sputum or mucus plugs or hemoptysis or cp or chest tightness, subjective wheeze or overt sinus or hb symptoms. No unusual exposure hx or h/o childhood pna/ asthma or knowledge of premature birth.  Sleeping  At her usual 30 degree incline fine   without nocturnal  or early am exacerbation  of respiratory  c/o's or need for noct saba. Also denies any obvious fluctuation of symptoms with weather or environmental changes or other aggravating or alleviating factors except as outlined above   Current Allergies, Complete Past Medical History, Past Surgical History, Family History, and Social History were reviewed in [employer] record.  ROS  The following are not active complaints unless bolded Hoarseness, sore throat, dysphagia, dental problems,  itching, sneezing,  nasal congestion or discharge of excess mucus or purulent secretions, ear ache,   fever, chills, sweats, unintended wt loss or wt gain, classically pleuritic or exertional cp,  orthopnea pnd or arm/hand swelling  or leg swelling, presyncope, palpitations, abdominal pain, anorexia, nausea, vomiting, diarrhea  or change in bowel habits or change in bladder habits, change in stools or change in urine, dysuria, hematuria,  rash, arthralgias, visual complaints, headache, numbness, weakness or ataxia or problems with walking or coordination,  change in mood or  memory.        Current Meds  Medication Sig  . albuterol (PROVENTIL HFA;VENTOLIN HFA) 108 (90 Base) MCG/ACT inhaler Inhale 2 puffs into the lungs every 6 (six) hours as needed for wheezing or shortness of breath.  Marland Kitchen apixaban (ELIQUIS) 5 MG TABS tablet Take 1 tablet (5 mg total) by mouth 2 (two) times daily.  Marland Kitchen aspirin EC 81 MG tablet Take 81 mg by mouth daily.  Marland Kitchen atorvastatin (LIPITOR) 40 MG tablet TAKE 1 TABLET BY MOUTH ONCE DAILY  . buPROPion (WELLBUTRIN XL) 150 MG 24 hr tablet TAKE 1 TABLET BY MOUTH ONCE  DAILY  . cyclobenzaprine (FLEXERIL) 10 MG tablet TAKE 1 TABLET BY MOUTH THREE TIMES DAILY AS NEEDED FOR MUSCLE SPASM  . DEXILANT 60 MG capsule TAKE 1 CAPSULE BY MOUTH ONCE DAILY  . famotidine (PEPCID) 20 MG tablet One at bedtime  . FLUoxetine (PROZAC) 40 MG capsule TAKE 2 CAPSULES BY MOUTH ONCE DAILY  . HYDROcodone-acetaminophen (NORCO) 10-325 MG tablet Take 1 tablet by mouth every 6 (six) hours as needed.   Marland Kitchen LORazepam (ATIVAN) 0.5 MG tablet TAKE 1 TABLET BY MOUTH EVERY 8 HOURS AS NEEDED               Objective:   Physical Exam    Pleasant amb wf nad   04/20/2018         235  03/09/18 231 lb (104.8 kg)  02/18/18 233 lb (105.7 kg)  01/26/18 227 lb (103 kg)      Vital signs reviewed - Note on arrival 02 sats  97% on RA         HEENT: nl  turbinates bilaterally, and oropharynx. Nl external ear canals without  cough reflex   full dentures   NECK :  without JVD/Nodes/TM/ nl carotid upstrokes bilaterally   LUNGS: no acc muscle use,  Nl contour chest which is clear to A and P bilaterally without cough on insp or exp maneuvers   CV:  RRR  no s3 or murmur or increase in P2, and no edema   ABD:  Obese soft and nontender with nl inspiratory excursion in the supine position. No bruits or organomegaly appreciated, bowel sounds nl  MS:  Nl gait/ ext warm without deformities, calf tenderness, cyanosis or clubbing No obvious joint restrictions   SKIN: warm and dry without lesions    NEURO:  alert, approp, nl sensorium with  no motor or cerebellar deficits apparent.               Assessment:

## 2018-04-21 ENCOUNTER — Encounter: Payer: Self-pay | Admitting: Internal Medicine

## 2018-04-21 NOTE — Assessment & Plan Note (Signed)
Body mass index is 39.21 kg/m.  -  trending up Lab Results  Component Value Date   TSH 2.090 03/23/2017     Contributing to gerd risk/ doe/reviewed the need and the process to achieve and maintain neg calorie balance > defer f/u primary care including intermittently monitoring thyroid status

## 2018-04-21 NOTE — Assessment & Plan Note (Signed)
03/09/2018  Walked RA x 3 laps @ 185 ft each stopped due to  End of study, nl to moderately fast pace, no desat / min sob  - Spirometry 04/20/2018  wnl   Suspect this is mostly an obesity/  conditioning issue now > f/u is prn

## 2018-04-21 NOTE — Assessment & Plan Note (Signed)
Dx 01/16/18 -Venous Dopplers  negative for DVT. -Echocardiogram shows an ejection fraction of 60-65% with no wall motion abnormalities, moderately dilated RV with a pulmonary artery systolic pressure of 45. -prothrombin gene mutation, lupus anticoagulant and factor V Leiden neg/ note father also had a large in his case fatal PE  - Echo  03/11/2018  Ok x G I  diastolic dysfunction    I had an extended final summary discussion with the patient reviewing all relevant studies completed to date and  lasting 15 to 20 minutes of a 25 minute visit on the following issues:   She has no further evidence of R ht strain or dvt but PE was unprovoked and she is morbidly obese with pos fm hx PE so rec lifelong rx unless hematology clears her  Ok to reduce the dose to prophylactic levels in 01/2019 and f/u in pulmonary prn   Each maintenance medication was reviewed in detail including most importantly the difference between maintenance and as needed and under what circumstances the prns are to be used.  Please see AVS for specific  Instructions which are unique to this visit and I personally typed out  which were reviewed in detail in writing with the patient and a copy provided.

## 2018-04-21 NOTE — Assessment & Plan Note (Addendum)
Rec noct pepcid and 1st gen H1 blockers per guidelines  03/09/2018   Reviewed rx again > she insists this is getting much better > f/u ent prn

## 2018-05-05 ENCOUNTER — Telehealth: Payer: Self-pay | Admitting: Internal Medicine

## 2018-05-05 NOTE — Telephone Encounter (Signed)
Called and spoke with pt who stated she was told at last OV with MW 6/5 that if pt had any problems paying for the Eliquis to call our office.  Pt states her Eliquis has gone from $90 to $244 and is wanting to be switched to a different medication that she can afford.  Dr. Melvyn Novas, please advise. Thanks!

## 2018-05-05 NOTE — Telephone Encounter (Signed)
My instructions:  Fine with me if your primary care doctor takes over on your eliquis but for now I recommend:  1) full dose eliquis unitl 01/2019 then then half strength for life  2) if want to stop eliquis at any point then see a hematologist for a second opinion risk vs benefit - minor injections you can stop for 3 doses   If we have samples of eliquis or xarelto it's fine to give them to her but I had rec her PCP take over this rx and if not willing or able then f/u here with one of our NP's to help sort out cost effective options but she needs to be on some form of affordable rx as above for life

## 2018-05-05 NOTE — Telephone Encounter (Signed)
Called and spoke with pt relaying the information that was stated by MW.  Pt expressed understanding and stated she was going to call PCP.  Nothing further needed.

## 2018-05-06 ENCOUNTER — Telehealth: Payer: Self-pay | Admitting: Family

## 2018-05-06 NOTE — Telephone Encounter (Signed)
Samples given. Patient notified

## 2018-05-06 NOTE — Telephone Encounter (Signed)
Second call she runs out today wants a call ASAP!!!

## 2018-05-06 NOTE — Telephone Encounter (Signed)
We have samples of this. Please have patient come and pick up. Thanks

## 2018-05-09 ENCOUNTER — Other Ambulatory Visit: Payer: Self-pay

## 2018-05-09 NOTE — Patient Outreach (Signed)
[facility]) Care Management  05/09/2018  Lori Cooper December 30, 1955 [id number]   Referral Date: 05/09/18 Referral Source: [facility] Concierge Referral Reason:Patient in coverage gap  Outreach Attempt: Spoke with patient.  She is able to verify HIPAA.  Discussed with patient reason for referral. Patient reports that she needs help with eliquis as she will be in the coverage gap. Patient states she got samples from her doctor on Friday and has a little less than a month supply.     Social: Patient lives in the home with spouse. Patient is independent with ADL's and still drives.    Conditions: Patient with recent history of blood clots in her lungs and is on Eliquis for life.  Patient also reports arthritis and history of depression.     Medications: Patient unable to afford Eliquis   Appointments:  Patient to see PCP on tomorrow.   Advanced Directives: Patient states that her spouse is her health care POA.    Consent: Discussed [facility] services and patient agreeable to pharmacy referral for assistance.    Plan:RN CM will refer to pharmacy.   Jone Baseman, RN, MSN [facility] Care Management Care Management Coordinator Direct Line [phone] Toll Free: [phone]  Fax: [phone]

## 2018-05-10 ENCOUNTER — Encounter: Payer: Self-pay | Admitting: Family

## 2018-05-10 ENCOUNTER — Other Ambulatory Visit: Payer: Self-pay

## 2018-05-10 ENCOUNTER — Ambulatory Visit (INDEPENDENT_AMBULATORY_CARE_PROVIDER_SITE_OTHER): Payer: PPO | Admitting: Family

## 2018-05-10 VITALS — BP 124/65 | HR 88 | Temp 97.9°F | Ht 65.0 in | Wt 233.8 lb

## 2018-05-10 DIAGNOSIS — F411 Generalized anxiety disorder: Secondary | ICD-10-CM

## 2018-05-10 DIAGNOSIS — Z1211 Encounter for screening for malignant neoplasm of colon: Secondary | ICD-10-CM

## 2018-05-10 DIAGNOSIS — F331 Major depressive disorder, recurrent, moderate: Secondary | ICD-10-CM

## 2018-05-10 DIAGNOSIS — M1990 Unspecified osteoarthritis, unspecified site: Secondary | ICD-10-CM | POA: Diagnosis not present

## 2018-05-10 DIAGNOSIS — E782 Mixed hyperlipidemia: Secondary | ICD-10-CM

## 2018-05-10 DIAGNOSIS — Z1212 Encounter for screening for malignant neoplasm of rectum: Secondary | ICD-10-CM | POA: Diagnosis not present

## 2018-05-10 DIAGNOSIS — K219 Gastro-esophageal reflux disease without esophagitis: Secondary | ICD-10-CM

## 2018-05-10 DIAGNOSIS — R7303 Prediabetes: Secondary | ICD-10-CM | POA: Diagnosis not present

## 2018-05-10 LAB — BAYER DCA HB A1C WAIVED: HB A1C (BAYER DCA - WAIVED): 5.7 % (ref <7.0)

## 2018-05-10 MED ORDER — PANTOPRAZOLE SODIUM 20 MG PO TBEC: 20.0000 mg | DELAYED_RELEASE_TABLET | Freq: Every day | ORAL | 1 refills | Status: DC

## 2018-05-10 NOTE — Progress Notes (Signed)
Subjective:    Patient ID: Lori Cooper, female    DOB: May 02, 1956, 62 y.o.   MRN: [id number]  Chief Complaint  Patient presents with  . Diabetes    due for check up and lab work    Diabetes  She presents for her follow-up diabetic visit. She has type 2 diabetes mellitus. Her disease course has been stable. There are no hypoglycemic associated symptoms. Pertinent negatives for diabetes include no blurred vision, no foot paresthesias and no visual change. Symptoms are stable. She is following a generally healthy diet. (Does not check BS at home ) Eye exam is not current.  Hypertension  This is a chronic problem. The current episode started more than 1 year ago. The problem has been resolved since onset. The problem is controlled. Associated symptoms include malaise/fatigue and shortness of breath. Pertinent negatives include no blurred vision or peripheral edema. Risk factors for coronary artery disease include dyslipidemia and obesity. The current treatment provides moderate improvement. Hypertensive end-organ damage includes CAD/MI. There is no history of kidney disease.  Arthritis  Presents for follow-up visit. She complains of pain and stiffness. The symptoms have been stable. Affected locations include the right knee and left knee. Her pain is at a severity of 1/10.  Depression         This is a chronic problem.  The current episode started more than 1 year ago.   The onset quality is gradual.   The problem occurs intermittently (her dog diet recently).  Associated symptoms include restlessness and sad.  Associated symptoms include no helplessness, no hopelessness, no decreased interest and no appetite change.  Past treatments include SSRIs - Selective serotonin reuptake inhibitors. Hyperlipidemia  This is a chronic problem. The current episode started more than 1 year ago. The problem is uncontrolled. Recent lipid tests were reviewed and are high. Exacerbating diseases include obesity.  Associated symptoms include shortness of breath. Current antihyperlipidemic treatment includes statins. The current treatment provides moderate improvement of lipids. Risk factors for coronary artery disease include dyslipidemia, family history and hypertension.  Gastroesophageal Reflux  She reports no belching, no coughing or no heartburn. This is a chronic problem. The current episode started more than 1 year ago. The problem occurs occasionally. The problem has been waxing and waning. The symptoms are aggravated by certain foods. Risk factors include obesity. She has tried a PPI for the symptoms. The treatment provided moderate relief.      Review of Systems  Constitutional: Positive for malaise/fatigue. Negative for appetite change.  Eyes: Negative for blurred vision.  Respiratory: Positive for shortness of breath. Negative for cough.   Gastrointestinal: Negative for heartburn.  Musculoskeletal: Positive for arthritis and stiffness.  Psychiatric/Behavioral: Positive for depression.  All other systems reviewed and are negative.      Objective:   Physical Exam  Constitutional: She is oriented to person, place, and time. She appears well-developed and well-nourished. No distress.  HENT:  Head: Normocephalic and atraumatic.  Right Ear: External ear normal.  Left Ear: External ear normal.  Mouth/Throat: Oropharynx is clear and moist.  Eyes: Pupils are equal, round, and reactive to light.  Neck: Normal range of motion. Neck supple. No thyromegaly present.  Cardiovascular: Normal rate, regular rhythm, normal heart sounds and intact distal pulses.  No murmur heard. Pulmonary/Chest: Effort normal and breath sounds normal. No respiratory distress. She has no wheezes.  Abdominal: Soft. Bowel sounds are normal. She exhibits no distension. There is no tenderness.  Musculoskeletal: Normal range of  motion. She exhibits no edema or tenderness.  Neurological: She is alert and oriented to person,  place, and time. She has normal reflexes. No cranial nerve deficit.  Skin: Skin is warm and dry.  Psychiatric: She has a normal mood and affect. Her behavior is normal. Judgment and thought content normal.  Vitals reviewed.     BP 124/65   Pulse 88   Temp 97.9 F (36.6 C) (Oral)   Ht 5' 5" (1.651 m)   Wt 233 lb 12.8 oz (106.1 kg)   BMI 38.91 kg/m      Assessment & Plan:  Lori Cooper comes in today with chief complaint of Diabetes (due for check up and lab work)   Diagnosis and orders addressed:  1. Gastroesophageal reflux disease, esophagitis presence not specified Will stop Dexilant today related to cost and start Protonix  -Diet discussed- Avoid fried, spicy, citrus foods, caffeine and alcohol -Do not eat 2-3 hours before bedtime -Encouraged small frequent meals -Avoid NSAID's - CMP14+EGFR - CBC with Differential/Platelet - pantoprazole (PROTONIX) 20 MG tablet; Take 1 tablet (20 mg total) by mouth daily.  Dispense: 90 tablet; Refill: 1  2. Mixed hyperlipidemia - CMP14+EGFR - Lipid panel - CBC with Differential/Platelet  3. Generalized anxiety disorder - CMP14+EGFR - CBC with Differential/Platelet  4. Prediabetes - CMP14+EGFR - CBC with Differential/Platelet - Bayer DCA Hb A1c Waived  5. Morbid obesity due to excess calories ([place]) complicated by hyperlipidemia/ preDM - CMP14+EGFR - CBC with Differential/Platelet  6. Moderate episode of recurrent major depressive disorder (HCC) - CMP14+EGFR - CBC with Differential/Platelet  7. Arthritis - CMP14+EGFR - CBC with Differential/Platelet  8. Colon cancer screening - Cologuard  9. Screening for malignant neoplasm of the rectum - Cologuard   Labs pending Health Maintenance reviewed Diet and exercise encouraged  Follow up plan: 3 months    Evelina Dun, FNP

## 2018-05-10 NOTE — Patient Instructions (Signed)
Food Choices for Gastroesophageal Reflux Disease, Adult When you have gastroesophageal reflux disease (GERD), the foods you eat and your eating habits are very important. Choosing the right foods can help ease your discomfort. What guidelines do I need to follow?  Choose fruits, vegetables, whole grains, and low-fat dairy products.  Choose low-fat meat, fish, and poultry.  Limit fats such as oils, salad dressings, butter, nuts, and avocado.  Keep a food diary. This helps you identify foods that cause symptoms.  Avoid foods that cause symptoms. These may be different for everyone.  Eat small meals often instead of 3 large meals a day.  Eat your meals slowly, in a place where you are relaxed.  Limit fried foods.  Cook foods using methods other than frying.  Avoid drinking alcohol.  Avoid drinking large amounts of liquids with your meals.  Avoid bending over or lying down until 2-3 hours after eating. What foods are not recommended? These are some foods and drinks that may make your symptoms worse: Vegetables  Tomatoes. Tomato juice. Tomato and spaghetti sauce. Chili peppers. Onion and garlic. Horseradish. Fruits  Oranges, grapefruit, and lemon (fruit and juice). Meats  High-fat meats, fish, and poultry. This includes hot dogs, ribs, ham, sausage, salami, and bacon. Dairy  Whole milk and chocolate milk. Sour cream. Cream. Butter. Ice cream. Cream cheese. Drinks  Coffee and tea. Bubbly (carbonated) drinks or energy drinks. Condiments  Hot sauce. Barbecue sauce. Sweets/Desserts  Chocolate and cocoa. Donuts. Peppermint and spearmint. Fats and Oils  High-fat foods. This includes French fries and potato chips. Other  Vinegar. Strong spices. This includes black pepper, white pepper, red pepper, cayenne, curry powder, cloves, ginger, and chili powder. The items listed above may not be a complete list of foods and drinks to avoid. Contact your dietitian for more information.    This information is not intended to replace advice given to you by your health care provider. Make sure you discuss any questions you have with your health care provider. Document Released: 05/03/2012 Document Revised: 04/09/2016 Document Reviewed: 09/06/2013 Elsevier Interactive Patient Education  2017 Elsevier Inc.  

## 2018-05-10 NOTE — Patient Outreach (Signed)
[facility]) Care Management  05/10/2018  Lori Cooper September 20, 1956 [id number]  62 year old female referred to [facility].  [facility] services requested for medication assistance for Eliquis.  PMHx includes, but not limited to, pulmonary embolism, GERD, arthritis, hyperlipidemia, restless leg syndrome, prediabetes, generalized anxiety disorder depression and obesity.  Unsuccessful outreach attempt #1 to [name].  Left HIPAA compliant voice message requesting a return call.  Plan: Outreach attempt #2 in 3-4 business days.  Joetta Manners, PharmD Clinical Pharmacist [place] [phone]

## 2018-05-11 ENCOUNTER — Other Ambulatory Visit: Payer: Self-pay

## 2018-05-11 ENCOUNTER — Other Ambulatory Visit: Payer: Self-pay | Admitting: Pharmacy Technician

## 2018-05-11 LAB — CMP14+EGFR
ALT: 37 IU/L — ABNORMAL HIGH (ref 0–32)
AST: 31 IU/L (ref 0–40)
Albumin/Globulin Ratio: 1.8 (ref 1.2–2.2)
Albumin: 4.3 g/dL (ref 3.6–4.8)
Alkaline Phosphatase: 118 IU/L — ABNORMAL HIGH (ref 39–117)
BUN/Creatinine Ratio: 11 — ABNORMAL LOW (ref 12–28)
BUN: 9 mg/dL (ref 8–27)
Bilirubin Total: 0.3 mg/dL (ref 0.0–1.2)
CO2: 23 mmol/L (ref 20–29)
Calcium: 9.4 mg/dL (ref 8.7–10.3)
Chloride: 102 mmol/L (ref 96–106)
Creatinine, Ser: 0.81 mg/dL (ref 0.57–1.00)
GFR calc Af Amer: 89 mL/min/{1.73_m2} (ref >59)
GFR calc non Af Amer: 78 mL/min/{1.73_m2} (ref >59)
Globulin, Total: 2.4 g/dL (ref 1.5–4.5)
Glucose: 95 mg/dL (ref 65–99)
Potassium: 4.8 mmol/L (ref 3.5–5.2)
Sodium: 140 mmol/L (ref 134–144)
Total Protein: 6.7 g/dL (ref 6.0–8.5)

## 2018-05-11 LAB — CBC WITH DIFFERENTIAL/PLATELET
Basophils Absolute: 0.1 10*3/uL (ref 0.0–0.2)
Basos: 1 %
EOS (ABSOLUTE): 0.1 10*3/uL (ref 0.0–0.4)
Eos: 2 %
Hematocrit: 38.6 % (ref 34.0–46.6)
Hemoglobin: 13 g/dL (ref 11.1–15.9)
Immature Grans (Abs): 0 10*3/uL (ref 0.0–0.1)
Immature Granulocytes: 0 %
Lymphocytes Absolute: 2.6 10*3/uL (ref 0.7–3.1)
Lymphs: 35 %
MCH: 28.8 pg (ref 26.6–33.0)
MCHC: 33.7 g/dL (ref 31.5–35.7)
MCV: 85 fL (ref 79–97)
Monocytes Absolute: 0.4 10*3/uL (ref 0.1–0.9)
Monocytes: 6 %
Neutrophils Absolute: 4 10*3/uL (ref 1.4–7.0)
Neutrophils: 56 %
Platelets: 340 10*3/uL (ref 150–450)
RBC: 4.52 x10E6/uL (ref 3.77–5.28)
RDW: 14 % (ref 12.3–15.4)
WBC: 7.2 10*3/uL (ref 3.4–10.8)

## 2018-05-11 LAB — LIPID PANEL
Chol/HDL Ratio: 3.1 ratio (ref 0.0–4.4)
Cholesterol, Total: 187 mg/dL (ref 100–199)
HDL: 61 mg/dL (ref >39)
LDL Calculated: 97 mg/dL (ref 0–99)
Triglycerides: 145 mg/dL (ref 0–149)
VLDL Cholesterol Cal: 29 mg/dL (ref 5–40)

## 2018-05-11 NOTE — Patient Outreach (Addendum)
[place] [facility]) Care Management  05/11/2018  Mysti Haley 1956-10-03 [id number]  62 year old female referred to [place] Management.  [place] services requested for medication assistance for Eliquis.  PMHx includes, but not limited to, pulmonary embolism, GERD, arthritis, hyperlipidemia, restless leg syndrome, prediabetes, generalized anxiety disorder depression and obesity.  Incoming call received from Mrs. Pippenger.  HIPAA identifiers verified.   Subjective: Mrs. 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 She reports that she picked up a 30 days supply of Eliquis yesterday, which put her into the donut hole.  She states that she will not be able to afford this medication anymore, but reports that she does have some samples from her doctor's office.  Discharge note states that Mrs. Ventrella will need Eliquis for 9-12 months.  Medication Assistance: Per financial discussion, patient does not qualify for Extra Help LIS.    [employer] reports her out of pocket (OOP) as $528.24 and her husband's OOP as $342.35.  Eliquis patient assistance through [employer] requires that a household spend 3% of income OOP on prescriptions.  Based on discussion with Mrs. Lordi this is about ~$911.  To date they have spent $870.59.   Will proceed with application process at this time.  They are so close to the household OOP that I anticipate the will have met 3% by the time the forms are ready to submit to [employer].  Plan: Route note to [facility] CPhT, Etter Sjogren to begin Eliquis patient assistance from [employer].  Route note to PCP, Pricilla Handler, FNP.   Joetta Manners, PharmD Clinical Pharmacist [place] [phone]

## 2018-05-11 NOTE — Patient Outreach (Signed)
[place] [facility]) Care Management  05/11/2018  Lori Cooper 04-23-1956 [id number]   Received [place] patient assistance referral from [facility] for Eliquis. Prepared patient portion to be mailed and faxed provider portion to Hawks-FNP.  Will follow up with patient in 5-7 business days to confirm application has been received.  Maud Deed [place], [place] Management [phone]

## 2018-05-13 ENCOUNTER — Ambulatory Visit: Payer: Self-pay

## 2018-05-16 ENCOUNTER — Other Ambulatory Visit: Payer: Self-pay | Admitting: Family

## 2018-05-16 ENCOUNTER — Encounter: Payer: Self-pay | Admitting: Family

## 2018-05-16 DIAGNOSIS — F411 Generalized anxiety disorder: Secondary | ICD-10-CM

## 2018-05-20 ENCOUNTER — Other Ambulatory Visit: Payer: Self-pay | Admitting: Pharmacy Technician

## 2018-05-20 NOTE — Patient Outreach (Signed)
[place] [facility]) Care Management  05/20/2018  Lori Cooper 1956/03/16 [id number]   Received patient portion of [place] application for [employer]. Company requires an [employer] out. Contacted [employer] source to have that emailed to me.   Will fax completed application once I receive OOP spending report.  Maud Deed [place], [place] Management [phone]

## 2018-05-23 ENCOUNTER — Other Ambulatory Visit: Payer: Self-pay | Admitting: Pharmacy Technician

## 2018-05-23 NOTE — Patient Outreach (Addendum)
[place] [facility]) Care Management  05/23/2018  Lori Cooper [birth year] [id number]   Faxed completed application and required documents to [facility] patient assistance for Eliquis.  Will follow up with [place] in 7-10 business days to check status of patients application.  Maud Deed [place], [place] Management [phone]  ADDENDUM 10:56am  Return call to [name], HIPAA identifiers verified. Informed patient that I received her portion of her application for Eliquis and that I submitted it to the company today.  Will follow up with company and patient in the next 7 days for an update on application.  Maud Deed [place], [place] Management [phone]

## 2018-05-24 DIAGNOSIS — Z1212 Encounter for screening for malignant neoplasm of rectum: Secondary | ICD-10-CM | POA: Diagnosis not present

## 2018-05-24 DIAGNOSIS — Z1211 Encounter for screening for malignant neoplasm of colon: Secondary | ICD-10-CM | POA: Diagnosis not present

## 2018-05-26 ENCOUNTER — Other Ambulatory Visit: Payer: Self-pay | Admitting: Pharmacy Technician

## 2018-05-26 LAB — COLOGUARD: Cologuard: POSITIVE

## 2018-05-26 NOTE — Patient Outreach (Signed)
[facility]) Care Management  05/26/2018  Lori Cooper [birth year] [id number]   Patient contacted me in reference to [place] contacting her and stating that she has not spent the [facility] requirement for program.  Per Mrs. Paulsen's permission, I submitted spouses OOP spending report to [employer] Harps to verify that they will apply it to [name] OOP spending.  Will follow up with [employer] in 2-3 business days to check application status.  Maud Deed [place], [place] Management [phone]

## 2018-06-10 ENCOUNTER — Ambulatory Visit: Payer: PPO | Admitting: Family

## 2018-06-10 ENCOUNTER — Other Ambulatory Visit: Payer: Self-pay | Admitting: Family

## 2018-06-14 DIAGNOSIS — M47816 Spondylosis without myelopathy or radiculopathy, lumbar region: Secondary | ICD-10-CM | POA: Diagnosis not present

## 2018-06-16 ENCOUNTER — Other Ambulatory Visit: Payer: Self-pay | Admitting: Pharmacy Technician

## 2018-06-16 NOTE — Patient Outreach (Signed)
[place] [facility]) Care Management  06/16/2018  [name] Nov 17, 1955 [id number]   Successful outreach call to Lori Cooper, HIPAA identifiers verifed. [name] was approved for Eliquis on 07/11 until 11/15/18 and confirmed that she received her medication from the company. Reviewed with patient how to obtain refills.  Will route note to [place] for case closure.  Maud Deed [place], [place] Management [phone]

## 2018-06-20 ENCOUNTER — Other Ambulatory Visit: Payer: Self-pay | Admitting: Family

## 2018-06-20 DIAGNOSIS — F411 Generalized anxiety disorder: Secondary | ICD-10-CM

## 2018-06-20 DIAGNOSIS — F329 Major depressive disorder, single episode, unspecified: Secondary | ICD-10-CM

## 2018-06-20 DIAGNOSIS — F32A Depression, unspecified: Secondary | ICD-10-CM

## 2018-06-20 DIAGNOSIS — M5441 Lumbago with sciatica, right side: Secondary | ICD-10-CM

## 2018-06-21 ENCOUNTER — Other Ambulatory Visit: Payer: Self-pay

## 2018-06-21 ENCOUNTER — Other Ambulatory Visit: Payer: Self-pay | Admitting: Family

## 2018-06-21 DIAGNOSIS — R195 Other fecal abnormalities: Secondary | ICD-10-CM

## 2018-06-21 MED ORDER — FAMOTIDINE 20 MG PO TABS: ORAL_TABLET | ORAL | 1 refills | Status: DC

## 2018-06-22 ENCOUNTER — Other Ambulatory Visit: Payer: Self-pay

## 2018-06-22 NOTE — Patient Outreach (Signed)
[place] [facility]) Care Management  06/22/2018  Lori Cooper Dec 22, 1955 [id number]  [name] has received her Eliquis patient assistance from [employer].  Will close her [place] case.   Plan: Route case closure letter to PCP, Dr. Lenna Gilford.   Joetta Manners, PharmD Clinical Pharmacist [place] [phone]

## 2018-06-29 DIAGNOSIS — M1711 Unilateral primary osteoarthritis, right knee: Secondary | ICD-10-CM | POA: Diagnosis not present

## 2018-07-13 ENCOUNTER — Telehealth: Payer: Self-pay | Admitting: Family

## 2018-07-13 ENCOUNTER — Ambulatory Visit (INDEPENDENT_AMBULATORY_CARE_PROVIDER_SITE_OTHER): Payer: PPO | Admitting: Internal Medicine

## 2018-07-13 ENCOUNTER — Encounter (INDEPENDENT_AMBULATORY_CARE_PROVIDER_SITE_OTHER): Payer: Self-pay | Admitting: Internal Medicine

## 2018-07-13 ENCOUNTER — Encounter (INDEPENDENT_AMBULATORY_CARE_PROVIDER_SITE_OTHER): Payer: Self-pay | Admitting: *Deleted

## 2018-07-13 ENCOUNTER — Telehealth (INDEPENDENT_AMBULATORY_CARE_PROVIDER_SITE_OTHER): Payer: Self-pay | Admitting: *Deleted

## 2018-07-13 VITALS — BP 160/92 | HR 68 | Temp 98.5°F | Ht 65.0 in | Wt 237.1 lb

## 2018-07-13 DIAGNOSIS — R195 Other fecal abnormalities: Secondary | ICD-10-CM

## 2018-07-13 MED ORDER — SUPREP BOWEL PREP KIT 17.5-3.13-1.6 GM/177ML PO SOLN: 1.0000 | Freq: Once | ORAL | 0 refills | Status: AC

## 2018-07-13 NOTE — Progress Notes (Signed)
Subjective:    Patient ID: Lori Cooper, female    DOB: [birth year], 62 y.o.   MRN: [id number]  HPI Referred by Evelina Dun for positive cologuard.  Her last colonoscopy was in June of 2009. Tubular adenoma, small internal hemorrhoids. (Dr. Fuller Plan).  She did not want to have a colonoscopy so she underwent the cologuard.  No change in stools. She takes a Oncologist every 3 days if she doesn't have a BM. No melena or BRRB.  Her appetite is good. No weight loss. Her GERD is mostly controlled with Protonix.  Rarely takes the Hydrocodone.  Hx of ventral hernia repair with Mesh Takes Eliquis BID for DVT, PE in March of this year.  Review of Systems Past Medical History:  Diagnosis Date  . Anxiety   . Chronic back pain   . DDD (degenerative disc disease), cervical   . DDD (degenerative disc disease), lumbosacral   . Depression   . GERD (gastroesophageal reflux disease)   . Hiatal hernia   . History of adenomatous polyp of colon    2009  tubular adenoma  . History of esophagitis   . History of idiopathic seizure    1984  x1 after vaginal delivery (per pt negative work-up and no issue since)  . History of left shoulder fracture    01/ 2014  proximal humerus fx  . Hyperlipidemia   . OA (osteoarthritis)    knees and thumbs  . RLS (restless legs syndrome)   . Supraumbilical hernia   . Umbilical hernia   . Wears dentures    lower    Past Surgical History:  Procedure Laterality Date  . CARDIOVASCULAR STRESS TEST  05/06/2010   Lexiscan nuclear study w/ no exercise/  probably normal , per images there is a large reversible defect in the mid to distal anterior wall, this seems to be shifting breast attenuation/  normal LV function and wall motion , ef 67%  . CARPAL TUNNEL RELEASE Bilateral 2011   excision ganglion cyst left wrist  . CATARACT EXTRACTION W/ INTRAOCULAR LENS  IMPLANT, BILATERAL  09 and 10/ 2017  . COLONOSCOPY  05/08/2008  . LAPAROSCOPIC CHOLECYSTECTOMY  2012  .  SHOULDER ARTHROSCOPY/  ACROMIOPLASTY/  DISTAL CLAVICAL RESECTION/  DEBRIDEMENT LABRAL TEAR Left 02/09/2005  . TONSILLECTOMY AND ADENOIDECTOMY  age 56  . TUBAL LIGATION Bilateral yrs ago  . VENTRAL HERNIA REPAIR N/A 12/31/2016   Procedure: LAPAROSCOPIC VENTRAL WALL HERNIA REPAIR WITH ERAS PATHWAY;  Surgeon: Michael Boston, MD;  Location: [place];  Service: General;  Laterality: N/A;    No Known Allergies  Current Outpatient Medications on File Prior to Visit  Medication Sig Dispense Refill  . albuterol (PROVENTIL HFA;VENTOLIN HFA) 108 (90 Base) MCG/ACT inhaler Inhale 2 puffs into the lungs every 6 (six) hours as needed for wheezing or shortness of breath. 1 Inhaler 2  . apixaban (ELIQUIS) 5 MG TABS tablet Take 1 tablet (5 mg total) by mouth 2 (two) times daily. 180 tablet 2  . aspirin EC 81 MG tablet Take 81 mg by mouth daily.    Marland Kitchen atorvastatin (LIPITOR) 40 MG tablet TAKE 1 TABLET BY MOUTH ONCE DAILY 90 tablet 1  . buPROPion (WELLBUTRIN XL) 150 MG 24 hr tablet TAKE 1 TABLET BY MOUTH ONCE DAILY 90 tablet 0  . cyclobenzaprine (FLEXERIL) 10 MG tablet TAKE 1 TABLET BY MOUTH THREE TIMES DAILY AS NEEDED FOR MUSCLE SPASM 60 tablet 0  . famotidine (PEPCID) 20 MG tablet One at  bedtime 90 tablet 1  . FLUoxetine (PROZAC) 40 MG capsule TAKE 2 CAPSULES BY MOUTH ONCE DAILY 180 capsule 0  . HYDROcodone-acetaminophen (NORCO) 10-325 MG tablet Take 1 tablet by mouth every 6 (six) hours as needed.     Marland Kitchen LORazepam (ATIVAN) 0.5 MG tablet TAKE 1 TABLET BY MOUTH EVERY 8 HOURS AS NEEDED 60 tablet 5  . pantoprazole (PROTONIX) 20 MG tablet Take 1 tablet (20 mg total) by mouth daily. 90 tablet 1   No current facility-administered medications on file prior to visit.         Objective:   Physical Exam Blood pressure (!) 160/92, pulse 68, temperature 98.5 F (36.9 C), height 5\' 5"  (1.651 m), weight 237 lb 1.6 oz (107.5 kg). Alert and oriented. Skin warm and dry. Oral mucosa is moist.   . Sclera  anicteric, conjunctivae is pink. Thyroid not enlarged. No cervical lymphadenopathy. Lungs clear. Heart regular rate and rhythm.  Abdomen is soft. Bowel sounds are positive. No hepatomegaly. No abdominal masses felt. No tenderness.  No edema to lower extremities.          Assessment & Plan:  Positive cologuard. The risks of bleeding, perforation and infection were reviewed with patient.

## 2018-07-13 NOTE — Telephone Encounter (Signed)
Patient needs suprep 

## 2018-07-13 NOTE — Patient Instructions (Signed)
The risks of bleeding, perforation and infection were reviewed with patient.  

## 2018-07-13 NOTE — Telephone Encounter (Signed)
This is ok for her to stop for colonoscopy

## 2018-07-13 NOTE — Telephone Encounter (Signed)
Nurse of Dr Laural Golden called stating they have pt scheduled for Colonoscopy on 10/31 they need the "okay" for the pt to stop her eliquis and asprin 2 days prior to the procedure.

## 2018-07-14 NOTE — Telephone Encounter (Signed)
Left detailed message stating ok to stop ASA and Eliquis per [facility] notes and to call back with any questions or concerns.

## 2018-07-19 ENCOUNTER — Telehealth (INDEPENDENT_AMBULATORY_CARE_PROVIDER_SITE_OTHER): Payer: Self-pay | Admitting: *Deleted

## 2018-07-19 NOTE — Telephone Encounter (Signed)
Per Evelina Dun, it is ok for patient to hold eliquis and ASA 2 days prior to proceudre

## 2018-07-22 DIAGNOSIS — R1011 Right upper quadrant pain: Secondary | ICD-10-CM | POA: Diagnosis not present

## 2018-08-12 ENCOUNTER — Telehealth: Payer: Self-pay | Admitting: Family

## 2018-08-12 ENCOUNTER — Encounter: Payer: Self-pay | Admitting: Family

## 2018-08-12 ENCOUNTER — Ambulatory Visit (INDEPENDENT_AMBULATORY_CARE_PROVIDER_SITE_OTHER): Payer: PPO | Admitting: Family

## 2018-08-12 VITALS — BP 121/67 | HR 82 | Temp 97.5°F | Ht 65.0 in | Wt 239.4 lb

## 2018-08-12 DIAGNOSIS — E8881 Metabolic syndrome: Secondary | ICD-10-CM

## 2018-08-12 DIAGNOSIS — J301 Allergic rhinitis due to pollen: Secondary | ICD-10-CM

## 2018-08-12 DIAGNOSIS — Z86711 Personal history of pulmonary embolism: Secondary | ICD-10-CM

## 2018-08-12 DIAGNOSIS — F1320 Sedative, hypnotic or anxiolytic dependence, uncomplicated: Secondary | ICD-10-CM

## 2018-08-12 DIAGNOSIS — E782 Mixed hyperlipidemia: Secondary | ICD-10-CM | POA: Diagnosis not present

## 2018-08-12 DIAGNOSIS — M1990 Unspecified osteoarthritis, unspecified site: Secondary | ICD-10-CM | POA: Diagnosis not present

## 2018-08-12 DIAGNOSIS — E559 Vitamin D deficiency, unspecified: Secondary | ICD-10-CM | POA: Diagnosis not present

## 2018-08-12 DIAGNOSIS — K219 Gastro-esophageal reflux disease without esophagitis: Secondary | ICD-10-CM | POA: Diagnosis not present

## 2018-08-12 DIAGNOSIS — R05 Cough: Secondary | ICD-10-CM

## 2018-08-12 DIAGNOSIS — F411 Generalized anxiety disorder: Secondary | ICD-10-CM | POA: Diagnosis not present

## 2018-08-12 DIAGNOSIS — F331 Major depressive disorder, recurrent, moderate: Secondary | ICD-10-CM | POA: Diagnosis not present

## 2018-08-12 DIAGNOSIS — R059 Cough, unspecified: Secondary | ICD-10-CM

## 2018-08-12 DIAGNOSIS — Z79899 Other long term (current) drug therapy: Secondary | ICD-10-CM | POA: Insufficient documentation

## 2018-08-12 MED ORDER — FLUTICASONE PROPIONATE 50 MCG/ACT NA SUSP: 2.0000 | Freq: Every day | NASAL | 6 refills | Status: DC

## 2018-08-12 MED ORDER — CETIRIZINE HCL 10 MG PO TABS: 10.0000 mg | ORAL_TABLET | Freq: Every day | ORAL | 11 refills | Status: DC

## 2018-08-12 MED ORDER — BENZONATATE 200 MG PO CAPS: 200.0000 mg | ORAL_CAPSULE | Freq: Three times a day (TID) | ORAL | 1 refills | Status: DC | PRN

## 2018-08-12 MED ORDER — LORAZEPAM 0.5 MG PO TABS: 0.5000 mg | ORAL_TABLET | Freq: Three times a day (TID) | ORAL | 5 refills | Status: DC | PRN

## 2018-08-12 NOTE — Patient Instructions (Signed)

## 2018-08-12 NOTE — Progress Notes (Signed)
Subjective:    Patient ID: Lori Cooper, female    DOB: [birth year], 62 y.o.   MRN: [id number]  Chief Complaint  Patient presents with  . Medical Management of Chronic Issues   Pt presents to the office today for chronic follow up. She is followed by Ortho for chronic back pain as needed and currently getting injection in her lumbar and gets her Norco.   PT states is overwhelmed with her weight and frustrated. States she has seen a dietain and tried dieting with no success. Requesting referral to bariatric surgery.  Hyperlipidemia  This is a chronic problem. The current episode started more than 1 year ago. The problem is controlled. Recent lipid tests were reviewed and are normal. Exacerbating diseases include obesity. Current antihyperlipidemic treatment includes statins. The current treatment provides moderate improvement of lipids. Risk factors for coronary artery disease include dyslipidemia, female sex, a sedentary lifestyle and post-menopausal.  Gastroesophageal Reflux  She complains of heartburn. She reports no belching or no coughing. This is a chronic problem. The current episode started more than 1 year ago. The problem occurs occasionally. She has tried a PPI for the symptoms. The treatment provided moderate relief.  Anxiety  Presents for follow-up visit. Symptoms include excessive worry, insomnia, irritability, nervous/anxious behavior, panic and restlessness. Symptoms occur occasionally. The severity of symptoms is moderate.    Depression         This is a chronic problem.  The current episode started more than 1 year ago.   The onset quality is gradual.   The problem occurs intermittently.  The problem has been waxing and waning since onset.  Associated symptoms include insomnia, restlessness, decreased interest and sad.  Associated symptoms include not irritable.  Past treatments include SSRIs - Selective serotonin reuptake inhibitors.  Past medical history includes anxiety.     Metabolic Syndrome Pt taking Lipitor. She is not active.  HX DVT PT currently taking Eliquis BID.    Review of Systems  Constitutional: Positive for irritability.  Respiratory: Negative for cough.   Gastrointestinal: Positive for heartburn.  Psychiatric/Behavioral: Positive for depression. The patient is nervous/anxious and has insomnia.   All other systems reviewed and are negative.      Objective:   Physical Exam  Constitutional: She is oriented to person, place, and time. She appears well-developed and well-nourished. She is not irritable. No distress.  HENT:  Head: Normocephalic and atraumatic.  Right Ear: External ear normal.  Left Ear: External ear normal.  Mouth/Throat: Oropharynx is clear and moist.  Eyes: Pupils are equal, round, and reactive to light.  Neck: Normal range of motion. Neck supple. No thyromegaly present.  Cardiovascular: Normal rate, regular rhythm, normal heart sounds and intact distal pulses.  No murmur heard. Pulmonary/Chest: Effort normal and breath sounds normal. No respiratory distress. She has no wheezes.  Abdominal: Soft. Bowel sounds are normal. She exhibits no distension. There is no tenderness.  Musculoskeletal: Normal range of motion. She exhibits no edema or tenderness.  Pain in lower lumbar with flexion and extension  Neurological: She is alert and oriented to person, place, and time. She has normal reflexes. No cranial nerve deficit.  Skin: Skin is warm and dry.  Psychiatric: She has a normal mood and affect. Her behavior is normal. Judgment and thought content normal.  Vitals reviewed.     BP 121/67   Pulse 82   Temp (!) 97.5 F (36.4 C) (Oral)   Ht '5\' 5"'$  (1.651 m)   Wt 239  lb 6.4 oz (108.6 kg)   BMI 39.84 kg/m      Assessment & Plan:  Lori Cooper comes in today with chief complaint of Medical Management of Chronic Issues   Diagnosis and orders addressed:  1. Arthritis - CBC with Differential/Platelet -  CMP14+EGFR  2. Moderate episode of recurrent major depressive disorder (HCC) - CBC with Differential/Platelet - CMP14+EGFR  3. Generalized anxiety disorder - CBC with Differential/Platelet - CMP14+EGFR - ToxASSURE Select 13 (MW), Urine - LORazepam (ATIVAN) 0.5 MG tablet; Take 1 tablet (0.5 mg total) by mouth every 8 (eight) hours as needed.  Dispense: 60 tablet; Refill: 5  4. Morbid obesity due to excess calories ([place]) complicated by hyperlipidemia/ preDM - CBC with Differential/Platelet - CMP14+EGFR  5. Mixed hyperlipidemia - CBC with Differential/Platelet - CMP14+EGFR - Lipid panel  6. Gastroesophageal reflux disease, esophagitis presence not specified - CBC with Differential/Platelet - CMP14+EGFR  7. Vitamin D deficiency - CBC with Differential/Platelet - CMP14+EGFR - VITAMIN D 25 Hydroxy (Vit-D Deficiency, Fractures)  8. Metabolic syndrome - CBC with Differential/Platelet - CMP14+EGFR - Lipid panel  9. History of pulmonary embolus (PE) - CBC with Differential/Platelet - CMP14+EGFR  10. Controlled substance agreement signed - CBC with Differential/Platelet - CMP14+EGFR - ToxASSURE Select 13 (MW), Urine  11. Benzodiazepine dependence (HCC) - CBC with Differential/Platelet - CMP14+EGFR - ToxASSURE Select 13 (MW), Urine  12. Allergic rhinitis due to pollen, unspecified seasonality - fluticasone (FLONASE) 50 MCG/ACT nasal spray; Place 2 sprays into both nostrils daily.  Dispense: 16 g; Refill: 6 - cetirizine (ZYRTEC) 10 MG tablet; Take 1 tablet (10 mg total) by mouth daily.  Dispense: 30 tablet; Refill: 11 - benzonatate (TESSALON) 200 MG capsule; Take 1 capsule (200 mg total) by mouth 3 (three) times daily as needed.  Dispense: 30 capsule; Refill: 1  13. Cough Will start flonase and zyrtec  Continue GERD medication - fluticasone (FLONASE) 50 MCG/ACT nasal spray; Place 2 sprays into both nostrils daily.  Dispense: 16 g; Refill: 6 - cetirizine (ZYRTEC) 10 MG  tablet; Take 1 tablet (10 mg total) by mouth daily.  Dispense: 30 tablet; Refill: 11 - benzonatate (TESSALON) 200 MG capsule; Take 1 capsule (200 mg total) by mouth 3 (three) times daily as needed.  Dispense: 30 capsule; Refill: 1   Labs pending Health Maintenance reviewed Diet and exercise encouraged  Follow up plan: 6 months    Evelina Dun, FNP

## 2018-08-13 LAB — CBC WITH DIFFERENTIAL/PLATELET
Basophils Absolute: 0.1 10*3/uL (ref 0.0–0.2)
Basos: 1 %
EOS (ABSOLUTE): 0.2 10*3/uL (ref 0.0–0.4)
Eos: 3 %
Hematocrit: 39.1 % (ref 34.0–46.6)
Hemoglobin: 12.6 g/dL (ref 11.1–15.9)
Immature Grans (Abs): 0 10*3/uL (ref 0.0–0.1)
Immature Granulocytes: 0 %
Lymphocytes Absolute: 2.2 10*3/uL (ref 0.7–3.1)
Lymphs: 33 %
MCH: 28.5 pg (ref 26.6–33.0)
MCHC: 32.2 g/dL (ref 31.5–35.7)
MCV: 89 fL (ref 79–97)
Monocytes Absolute: 0.5 10*3/uL (ref 0.1–0.9)
Monocytes: 8 %
Neutrophils Absolute: 3.7 10*3/uL (ref 1.4–7.0)
Neutrophils: 55 %
Platelets: 343 10*3/uL (ref 150–450)
RBC: 4.42 x10E6/uL (ref 3.77–5.28)
RDW: 12.1 % — ABNORMAL LOW (ref 12.3–15.4)
WBC: 6.6 10*3/uL (ref 3.4–10.8)

## 2018-08-13 LAB — CMP14+EGFR
ALT: 27 IU/L (ref 0–32)
AST: 24 IU/L (ref 0–40)
Albumin/Globulin Ratio: 1.7 (ref 1.2–2.2)
Albumin: 3.7 g/dL (ref 3.6–4.8)
Alkaline Phosphatase: 106 IU/L (ref 39–117)
BUN/Creatinine Ratio: 9 — ABNORMAL LOW (ref 12–28)
BUN: 8 mg/dL (ref 8–27)
Bilirubin Total: 0.5 mg/dL (ref 0.0–1.2)
CO2: 25 mmol/L (ref 20–29)
Calcium: 9.5 mg/dL (ref 8.7–10.3)
Chloride: 102 mmol/L (ref 96–106)
Creatinine, Ser: 0.86 mg/dL (ref 0.57–1.00)
GFR calc Af Amer: 83 mL/min/{1.73_m2} (ref >59)
GFR calc non Af Amer: 72 mL/min/{1.73_m2} (ref >59)
Globulin, Total: 2.2 g/dL (ref 1.5–4.5)
Glucose: 104 mg/dL — ABNORMAL HIGH (ref 65–99)
Potassium: 4.5 mmol/L (ref 3.5–5.2)
Sodium: 141 mmol/L (ref 134–144)
Total Protein: 5.9 g/dL — ABNORMAL LOW (ref 6.0–8.5)

## 2018-08-13 LAB — LIPID PANEL
Chol/HDL Ratio: 3.2 ratio (ref 0.0–4.4)
Cholesterol, Total: 199 mg/dL (ref 100–199)
HDL: 62 mg/dL (ref >39)
LDL Calculated: 112 mg/dL — ABNORMAL HIGH (ref 0–99)
Triglycerides: 124 mg/dL (ref 0–149)
VLDL Cholesterol Cal: 25 mg/dL (ref 5–40)

## 2018-08-13 LAB — VITAMIN D 25 HYDROXY (VIT D DEFICIENCY, FRACTURES): Vit D, 25-Hydroxy: 25.3 ng/mL — ABNORMAL LOW (ref 30.0–100.0)

## 2018-08-19 LAB — TOXASSURE SELECT 13 (MW), URINE

## 2018-09-15 ENCOUNTER — Encounter (HOSPITAL_COMMUNITY)
Admission: RE | Disposition: A | Discharge status: Home / Self Care | Payer: Self-pay | Source: Ambulatory Visit | Attending: Internal Medicine

## 2018-09-15 ENCOUNTER — Other Ambulatory Visit: Payer: Self-pay

## 2018-09-15 ENCOUNTER — Encounter (HOSPITAL_COMMUNITY): Payer: Self-pay | Admitting: *Deleted

## 2018-09-15 ENCOUNTER — Ambulatory Visit (HOSPITAL_COMMUNITY)
Admission: RE | Admit: 2018-09-15 | Discharge: 2018-09-15 | Disposition: A | Discharge status: Home / Self Care | Payer: PPO | Source: Ambulatory Visit | Attending: Internal Medicine | Admitting: Internal Medicine

## 2018-09-15 DIAGNOSIS — Z7901 Long term (current) use of anticoagulants: Secondary | ICD-10-CM | POA: Diagnosis not present

## 2018-09-15 DIAGNOSIS — Z800 Family history of malignant neoplasm of digestive organs: Secondary | ICD-10-CM | POA: Diagnosis not present

## 2018-09-15 DIAGNOSIS — M549 Dorsalgia, unspecified: Secondary | ICD-10-CM | POA: Diagnosis not present

## 2018-09-15 DIAGNOSIS — Z7951 Long term (current) use of inhaled steroids: Secondary | ICD-10-CM | POA: Insufficient documentation

## 2018-09-15 DIAGNOSIS — Z79899 Other long term (current) drug therapy: Secondary | ICD-10-CM | POA: Insufficient documentation

## 2018-09-15 DIAGNOSIS — G8929 Other chronic pain: Secondary | ICD-10-CM | POA: Insufficient documentation

## 2018-09-15 DIAGNOSIS — F419 Anxiety disorder, unspecified: Secondary | ICD-10-CM | POA: Diagnosis not present

## 2018-09-15 DIAGNOSIS — F329 Major depressive disorder, single episode, unspecified: Secondary | ICD-10-CM | POA: Insufficient documentation

## 2018-09-15 DIAGNOSIS — K219 Gastro-esophageal reflux disease without esophagitis: Secondary | ICD-10-CM | POA: Insufficient documentation

## 2018-09-15 DIAGNOSIS — Z87891 Personal history of nicotine dependence: Secondary | ICD-10-CM | POA: Insufficient documentation

## 2018-09-15 DIAGNOSIS — E785 Hyperlipidemia, unspecified: Secondary | ICD-10-CM | POA: Diagnosis not present

## 2018-09-15 DIAGNOSIS — R195 Other fecal abnormalities: Secondary | ICD-10-CM | POA: Insufficient documentation

## 2018-09-15 DIAGNOSIS — Z79891 Long term (current) use of opiate analgesic: Secondary | ICD-10-CM | POA: Insufficient documentation

## 2018-09-15 DIAGNOSIS — Z7982 Long term (current) use of aspirin: Secondary | ICD-10-CM | POA: Insufficient documentation

## 2018-09-15 HISTORY — PX: COLONOSCOPY: SHX5424

## 2018-09-15 SURGERY — COLONOSCOPY
Anesthesia: Moderate Sedation

## 2018-09-15 MED ORDER — MEPERIDINE HCL 50 MG/ML IJ SOLN
INTRAMUSCULAR | Status: DC | PRN
  Administered 2018-09-15 (×3): 25 mg via INTRAVENOUS

## 2018-09-15 MED ORDER — SODIUM CHLORIDE 0.9 % IV SOLN
INTRAVENOUS | Status: DC
  Administered 2018-09-15: 11:00:00 via INTRAVENOUS

## 2018-09-15 MED ORDER — MIDAZOLAM HCL 5 MG/5ML IJ SOLN
INTRAMUSCULAR | Status: AC
  Filled 2018-09-15: qty 10

## 2018-09-15 MED ORDER — MEPERIDINE HCL 50 MG/ML IJ SOLN
INTRAMUSCULAR | Status: AC
  Filled 2018-09-15: qty 1

## 2018-09-15 MED ORDER — MIDAZOLAM HCL 5 MG/5ML IJ SOLN
INTRAMUSCULAR | Status: DC | PRN
  Administered 2018-09-15 (×4): 2 mg via INTRAVENOUS

## 2018-09-15 MED ORDER — STERILE WATER FOR IRRIGATION IR SOLN
Status: DC | PRN
  Administered 2018-09-15: 11:00:00

## 2018-09-15 NOTE — Discharge Instructions (Signed)
Colonoscopy, Adult, Care After This sheet gives you information about how to care for yourself after your procedure. Your health care provider may also give you more specific instructions. If you have problems or questions, contact your health care provider. What can I expect after the procedure? After the procedure, it is common to have:  A small amount of blood in your stool for 24 hours after the procedure.  Some gas.  Mild abdominal cramping or bloating.  Follow these instructions at home: General instructions   For the first 24 hours after the procedure: ? Do not drive or use machinery. ? Do not sign important documents. ? Do not drink alcohol. ? Do your regular daily activities at a slower pace than normal. ? Eat soft, easy-to-digest foods. ? Rest often.  Take over-the-counter or prescription medicines only as told by your health care provider.  It is up to you to get the results of your procedure. Ask your health care provider, or the department performing the procedure, when your results will be ready. Relieving cramping and bloating  Try walking around when you have cramps or feel bloated.  Apply heat to your abdomen as told by your health care provider. Use a heat source that your health care provider recommends, such as a moist heat pack or a heating pad. ? Place a towel between your skin and the heat source. ? Leave the heat on for 20-30 minutes. ? Remove the heat if your skin turns bright red. This is especially important if you are unable to feel pain, heat, or cold. You may have a greater risk of getting burned. Eating and drinking  Drink enough fluid to keep your urine clear or pale yellow.  Resume your normal diet as instructed by your health care provider. Avoid heavy or fried foods that are hard to digest.  Avoid drinking alcohol for as long as instructed by your health care provider. Contact a health care provider if:  You have blood in your stool 2-3  days after the procedure. Get help right away if:  You have more than a small spotting of blood in your stool.  You pass large blood clots in your stool.  Your abdomen is swollen.  You have nausea or vomiting.  You have a fever.  You have increasing abdominal pain that is not relieved with medicine. This information is not intended to replace advice given to you by your health care provider. Make sure you discuss any questions you have with your health care provider. Document Released: 06/16/2004 Document Revised: 07/27/2016 Document Reviewed: 01/14/2016 Elsevier Interactive Patient Education  2018 [place] usual medications including aspirin and Eliquis as before. Resume usual diet. No driving for 24 hours. Consider next screening exam in 5 years after 2-day prep.

## 2018-09-15 NOTE — H&P (Signed)
Lori Cooper is an 62 y.o. female.   Chief Complaint: Patient is here for colonoscopy. HPI: 62 year old Caucasian female with: Gone as a screening test for CRC and came back positive.  Her last colonoscopy was normal 10 years ago.  She denies abdominal pain change in bowel habits or rectal bleeding.  She is on low-dose aspirin as well as Eliquis. Family history is positive for CRC in grandmother at late onset. She has been off Eliquis and aspirin for 2 days. Teenage 4 weeks ago was within normal limits.  Past Medical History:  Diagnosis Date  . Anxiety   . Chronic back pain   . DDD (degenerative disc disease), cervical   . DDD (degenerative disc disease), lumbosacral   . Depression   . GERD (gastroesophageal reflux disease)   . Hiatal hernia   . History of adenomatous polyp of colon    2009  tubular adenoma  . History of esophagitis   . History of idiopathic seizure    1984  x1 after vaginal delivery (per pt negative work-up and no issue since)  . History of left shoulder fracture    01/ 2014  proximal humerus fx  . Hyperlipidemia   . OA (osteoarthritis)    knees and thumbs  . RLS (restless legs syndrome)   . Supraumbilical hernia   . Umbilical hernia   . Wears dentures    lower    Past Surgical History:  Procedure Laterality Date  . CARDIOVASCULAR STRESS TEST  05/06/2010   Lexiscan nuclear study w/ no exercise/  probably normal , per images there is a large reversible defect in the mid to distal anterior wall, this seems to be shifting breast attenuation/  normal LV function and wall motion , ef 67%  . CARPAL TUNNEL RELEASE Bilateral 2011   excision ganglion cyst left wrist  . CATARACT EXTRACTION W/ INTRAOCULAR LENS  IMPLANT, BILATERAL  09 and 10/ 2017  . COLONOSCOPY  05/08/2008  . LAPAROSCOPIC CHOLECYSTECTOMY  2012  . SHOULDER ARTHROSCOPY/  ACROMIOPLASTY/  DISTAL CLAVICAL RESECTION/  DEBRIDEMENT LABRAL TEAR Left 02/09/2005  . TONSILLECTOMY AND ADENOIDECTOMY  age  7  . TUBAL LIGATION Bilateral yrs ago  . VENTRAL HERNIA REPAIR N/A 12/31/2016   Procedure: LAPAROSCOPIC VENTRAL WALL HERNIA REPAIR WITH ERAS PATHWAY;  Surgeon: Michael Boston, MD;  Location: [facility];  Service: General;  Laterality: N/A;    Family History  Problem Relation Age of Onset  . Hypertension Mother   . Kidney disease Mother   . Diabetes Mother   . Pulmonary embolism Father 51  . Obesity Sister    Social History:  reports that she quit smoking about 5 years ago. Her smoking use included cigarettes. She started smoking about 45 years ago. She has a 20.00 pack-year smoking history. She has never used smokeless tobacco. She reports that she does not drink alcohol or use drugs.  Allergies: No Known Allergies  Medications Prior to Admission  Medication Sig Dispense Refill  . acetaminophen (TYLENOL) 500 MG tablet Take 500 mg by mouth daily as needed.    Marland Kitchen albuterol (PROVENTIL HFA;VENTOLIN HFA) 108 (90 Base) MCG/ACT inhaler Inhale 2 puffs into the lungs every 6 (six) hours as needed for wheezing or shortness of breath.    Marland Kitchen apixaban (ELIQUIS) 5 MG TABS tablet Take 1 tablet (5 mg total) by mouth 2 (two) times daily. 180 tablet 2  . aspirin EC 81 MG tablet Take 81 mg by mouth daily at 2 PM.     .  atorvastatin (LIPITOR) 40 MG tablet TAKE 1 TABLET BY MOUTH ONCE DAILY (Patient taking differently: Take 40 mg by mouth daily at 2 PM. ) 90 tablet 1  . buPROPion (WELLBUTRIN XL) 150 MG 24 hr tablet TAKE 1 TABLET BY MOUTH ONCE DAILY (Patient taking differently: Take 150 mg by mouth daily at 2 PM. ) 90 tablet 0  . cetirizine (ZYRTEC) 10 MG tablet Take 1 tablet (10 mg total) by mouth daily. (Patient taking differently: Take 10 mg by mouth daily as needed for allergies. ) 30 tablet 11  . Cholecalciferol (VITAMIN D3 PO) Take 2 tablets by mouth daily at 2 PM.    . cyclobenzaprine (FLEXERIL) 10 MG tablet TAKE 1 TABLET BY MOUTH THREE TIMES DAILY AS NEEDED FOR MUSCLE SPASM 60 tablet 0  .  famotidine (PEPCID) 20 MG tablet One at bedtime (Patient taking differently: Take 20 mg by mouth at bedtime. ) 90 tablet 1  . FLUoxetine (PROZAC) 40 MG capsule TAKE 2 CAPSULES BY MOUTH ONCE DAILY (Patient taking differently: Take 80 mg by mouth daily at 2 PM. ) 180 capsule 0  . fluticasone (FLONASE) 50 MCG/ACT nasal spray Place 2 sprays into both nostrils daily. (Patient taking differently: Place 2 sprays into both nostrils daily as needed for allergies. ) 16 g 6  . HYDROcodone-acetaminophen (NORCO) 10-325 MG tablet Take 1 tablet by mouth daily as needed for moderate pain.     Marland Kitchen LORazepam (ATIVAN) 0.5 MG tablet Take 1 tablet (0.5 mg total) by mouth every 8 (eight) hours as needed. (Patient taking differently: Take 0.5 mg by mouth every 8 (eight) hours as needed for anxiety. ) 60 tablet 5  . Menthol, Topical Analgesic, (BIOFREEZE EX) Apply 1 application topically daily as needed (pain).    . pantoprazole (PROTONIX) 20 MG tablet Take 1 tablet (20 mg total) by mouth daily. (Patient taking differently: Take 20 mg by mouth daily at 2 PM. ) 90 tablet 1  . Phenol (CHLORASEPTIC MT) Use as directed 1 spray in the mouth or throat daily as needed (pain/itching).    . trolamine salicylate (ASPERCREME) 10 % cream Apply 1 application topically as needed for muscle pain.    . benzonatate (TESSALON) 200 MG capsule Take 1 capsule (200 mg total) by mouth 3 (three) times daily as needed. (Patient not taking: Reported on 09/08/2018) 30 capsule 1    No results found for this or any previous visit (from the past 48 hour(s)). No results found.  ROS  Blood pressure (!) 116/99, pulse 70, temperature 98.5 F (36.9 C), temperature source Oral, resp. rate 15, SpO2 98 %. Physical Exam  Constitutional: She appears well-developed and well-nourished.  HENT:  Mouth/Throat: Oropharynx is clear and moist.  Eyes: Conjunctivae are normal. No scleral icterus.  Neck: No thyromegaly present.  Cardiovascular: Normal rate, regular  rhythm and normal heart sounds.  No murmur heard. Respiratory: Effort normal and breath sounds normal.  GI:  Abdomen is full with laparoscopy scars.  It is soft and nontender with organomegaly or masses.  Musculoskeletal: She exhibits no edema.  Neurological: She is alert.  Skin: Skin is warm and dry.     Assessment/Plan Positive Cologuard test. Diagnostic colonoscopy.  Hildred Laser, MD 09/15/2018, 11:24 AM

## 2018-09-15 NOTE — Op Note (Addendum)
[facility] Patient Name: Lori Cooper Procedure Date: 09/15/2018 10:38 AM MRN: [id number] Date of Birth: [birth year] Attending MD: Hildred Laser , MD CSN: [id number] Age: 62 Admit Type: Outpatient Procedure:                Colonoscopy Indications:              Positive Cologuard test Providers:                Hildred Laser, MD, Jeanann Lewandowsky. Sharon Seller, RN, Nelma Rothman, Technician Referring MD:             Theador Hawthorne. Hawks, NP Medicines:                Meperidine 75 mg IV, Midazolam 8 mg IV Complications:            No immediate complications. Estimated Blood Loss:     Estimated blood loss: none. Procedure:                Pre-Anesthesia Assessment:                           - Prior to the procedure, a History and Physical                            was performed, and patient medications and                            allergies were reviewed. The patient's tolerance of                            previous anesthesia was also reviewed. The risks                            and benefits of the procedure and the sedation                            options and risks were discussed with the patient.                            All questions were answered, and informed consent                            was obtained. Prior Anticoagulants: The patient                            last took aspirin 2 days and Eliquis (apixaban) 2                            days prior to the procedure. ASA Grade Assessment:                            III - A patient with severe systemic disease. After  reviewing the risks and benefits, the patient was                            deemed in satisfactory condition to undergo the                            procedure.                           After obtaining informed consent, the colonoscope                            was passed under direct vision. Throughout the                            procedure, the patient's blood  pressure, pulse, and                            oxygen saturations were monitored continuously. The                            [id number]-[id number] ([id number]) scope was introduced through                            the anus and advanced to the the cecum, identified                            by appendiceal orifice and ileocecal valve. The                            colonoscopy was somewhat difficult due to                            significant looping. Successful completion of the                            procedure was aided by changing the patient to a                            supine position and using manual pressure. The                            patient tolerated the procedure well. The quality                            of the bowel preparation was good except the cecum                            was fair. The ileocecal valve, appendiceal orifice,                            and rectum were photographed. Scope In: 11:36:46 AM Scope Out: 12:03:48 PM Scope Withdrawal Time: 0 hours 14 minutes 22 seconds  Total Procedure Duration: 0  hours 27 minutes 2 seconds  Findings:      The perianal and digital rectal examinations were normal.      The colon (entire examined portion) appeared normal.      The retroflexed view of the distal rectum and anal verge was normal and       showed no anal or rectal abnormalities. Impression:               - The entire examined colon is normal. Prep at                            cecum and ascending colon fair after vigorous                            washing.                           - No specimens collected. Moderate Sedation:      Moderate (conscious) sedation was administered by the endoscopy nurse       and supervised by the endoscopist. The following parameters were       monitored: oxygen saturation, heart rate, blood pressure, CO2       capnography and response to care. Total physician intraservice time was       35 minutes. Recommendation:           -  Patient has a contact number available for                            emergencies. The signs and symptoms of potential                            delayed complications were discussed with the                            patient. Return to normal activities tomorrow.                            Written discharge instructions were provided to the                            patient.                           - Resume previous diet today.                           - Continue present medications.                           - Resume aspirin today and Eliquis (apixaban) today                            at prior doses.                           - Repeat colonoscopy in 5 years for screening  purposes after two day prep. Procedure Code(s):        --- Professional ---                           [phone], Colonoscopy, flexible; diagnostic, including                            collection of specimen(s) by brushing or washing,                            when performed (separate procedure)                           99153, Moderate sedation; each additional 15                            minutes intraservice time                           [id number], Moderate sedation services provided by the                            same physician or other qualified health care                            professional performing a gastrointestinal                            endoscopic service that sedation supports,                            requiring the presence of an independent trained                            observer to assist in the monitoring of the                            patient's level of consciousness and physiological                            status; initial 15 minutes of intra-service time;                            patient age 22 years or older (additional time may                            be reported with [phone], as appropriate) Diagnosis Code(s):        --- Professional ---                            R19.5, Other fecal abnormalities CPT copyright 2018 American Medical Association. All rights reserved. The codes documented in this report are preliminary and upon coder review may  be revised to meet current compliance requirements. Hildred Laser, MD Hildred Laser, MD 09/15/2018 12:12:33 PM This report has been signed electronically. Number of  Addenda: 0

## 2018-09-18 ENCOUNTER — Other Ambulatory Visit: Payer: Self-pay | Admitting: Family

## 2018-09-18 DIAGNOSIS — F32A Depression, unspecified: Secondary | ICD-10-CM

## 2018-09-18 DIAGNOSIS — F329 Major depressive disorder, single episode, unspecified: Secondary | ICD-10-CM

## 2018-09-18 DIAGNOSIS — F411 Generalized anxiety disorder: Secondary | ICD-10-CM

## 2018-09-21 ENCOUNTER — Encounter (HOSPITAL_COMMUNITY): Payer: Self-pay | Admitting: Internal Medicine

## 2018-09-23 DIAGNOSIS — M1711 Unilateral primary osteoarthritis, right knee: Secondary | ICD-10-CM | POA: Diagnosis not present

## 2018-09-27 ENCOUNTER — Telehealth: Payer: Self-pay | Admitting: Family

## 2018-09-27 NOTE — Telephone Encounter (Signed)
Pt aware form has been faxed She will bring income proof by & will refax forms

## 2018-11-08 ENCOUNTER — Other Ambulatory Visit: Payer: Self-pay | Admitting: Family

## 2018-11-08 DIAGNOSIS — K219 Gastro-esophageal reflux disease without esophagitis: Secondary | ICD-10-CM

## 2018-12-12 ENCOUNTER — Other Ambulatory Visit: Payer: Self-pay | Admitting: Family

## 2018-12-12 DIAGNOSIS — F411 Generalized anxiety disorder: Secondary | ICD-10-CM

## 2018-12-12 DIAGNOSIS — F329 Major depressive disorder, single episode, unspecified: Secondary | ICD-10-CM

## 2018-12-12 DIAGNOSIS — M5441 Lumbago with sciatica, right side: Secondary | ICD-10-CM

## 2018-12-12 DIAGNOSIS — F32A Depression, unspecified: Secondary | ICD-10-CM

## 2019-01-25 DIAGNOSIS — M1711 Unilateral primary osteoarthritis, right knee: Secondary | ICD-10-CM | POA: Diagnosis not present

## 2019-01-31 ENCOUNTER — Other Ambulatory Visit: Payer: Self-pay | Admitting: Family

## 2019-01-31 DIAGNOSIS — F411 Generalized anxiety disorder: Secondary | ICD-10-CM

## 2019-01-31 NOTE — Telephone Encounter (Signed)
Last seen 08/12/18

## 2019-02-03 ENCOUNTER — Telehealth: Payer: Self-pay | Admitting: Family

## 2019-02-03 NOTE — Telephone Encounter (Signed)
Patient coming in for med

## 2019-02-10 ENCOUNTER — Ambulatory Visit: Payer: PPO | Admitting: Family

## 2019-03-01 ENCOUNTER — Encounter: Payer: Self-pay | Admitting: Family

## 2019-03-01 ENCOUNTER — Other Ambulatory Visit: Payer: Self-pay

## 2019-03-01 ENCOUNTER — Ambulatory Visit (INDEPENDENT_AMBULATORY_CARE_PROVIDER_SITE_OTHER): Payer: PPO | Admitting: Family

## 2019-03-01 DIAGNOSIS — E8881 Metabolic syndrome: Secondary | ICD-10-CM | POA: Diagnosis not present

## 2019-03-01 DIAGNOSIS — K219 Gastro-esophageal reflux disease without esophagitis: Secondary | ICD-10-CM | POA: Diagnosis not present

## 2019-03-01 DIAGNOSIS — E782 Mixed hyperlipidemia: Secondary | ICD-10-CM

## 2019-03-01 DIAGNOSIS — J449 Chronic obstructive pulmonary disease, unspecified: Secondary | ICD-10-CM

## 2019-03-01 DIAGNOSIS — M5441 Lumbago with sciatica, right side: Secondary | ICD-10-CM

## 2019-03-01 DIAGNOSIS — F1320 Sedative, hypnotic or anxiolytic dependence, uncomplicated: Secondary | ICD-10-CM | POA: Diagnosis not present

## 2019-03-01 DIAGNOSIS — Z79899 Other long term (current) drug therapy: Secondary | ICD-10-CM

## 2019-03-01 DIAGNOSIS — F32A Depression, unspecified: Secondary | ICD-10-CM

## 2019-03-01 DIAGNOSIS — F329 Major depressive disorder, single episode, unspecified: Secondary | ICD-10-CM

## 2019-03-01 DIAGNOSIS — Z86711 Personal history of pulmonary embolism: Secondary | ICD-10-CM | POA: Diagnosis not present

## 2019-03-01 DIAGNOSIS — M1990 Unspecified osteoarthritis, unspecified site: Secondary | ICD-10-CM

## 2019-03-01 DIAGNOSIS — F331 Major depressive disorder, recurrent, moderate: Secondary | ICD-10-CM | POA: Diagnosis not present

## 2019-03-01 DIAGNOSIS — F411 Generalized anxiety disorder: Secondary | ICD-10-CM

## 2019-03-01 MED ORDER — FAMOTIDINE 20 MG PO TABS: 20.0000 mg | ORAL_TABLET | Freq: Every day | ORAL | 3 refills | Status: DC

## 2019-03-01 MED ORDER — PANTOPRAZOLE SODIUM 20 MG PO TBEC: 20.0000 mg | DELAYED_RELEASE_TABLET | Freq: Every day | ORAL | 0 refills | Status: DC

## 2019-03-01 MED ORDER — BUDESONIDE-FORMOTEROL FUMARATE 80-4.5 MCG/ACT IN AERO: 2.0000 | INHALATION_SPRAY | Freq: Two times a day (BID) | RESPIRATORY_TRACT | 3 refills | Status: DC

## 2019-03-01 MED ORDER — CYCLOBENZAPRINE HCL 10 MG PO TABS: 10.0000 mg | ORAL_TABLET | Freq: Three times a day (TID) | ORAL | 2 refills | Status: DC | PRN

## 2019-03-01 MED ORDER — BUPROPION HCL ER (XL) 150 MG PO TB24: 150.0000 mg | ORAL_TABLET | Freq: Every day | ORAL | 2 refills | Status: DC

## 2019-03-01 MED ORDER — LORAZEPAM 0.5 MG PO TABS: 0.5000 mg | ORAL_TABLET | Freq: Two times a day (BID) | ORAL | 5 refills | Status: DC | PRN

## 2019-03-01 MED ORDER — FLUOXETINE HCL 40 MG PO CAPS: 80.0000 mg | ORAL_CAPSULE | Freq: Every day | ORAL | 1 refills | Status: DC

## 2019-03-01 NOTE — Progress Notes (Signed)
Virtual Visit via telephone Note  I connected with Lori Cooper on 03/01/19 at 8:08 AM by telephone and verified that I am speaking with the correct person using two identifiers. Lori Cooper is currently located at home and no one is currently with her during visit. The provider, Evelina Dun, FNP is located in their office at time of visit.  I discussed the limitations, risks, security and privacy concerns of performing an evaluation and management service by telephone and the availability of in person appointments. I also discussed with the patient that there may be a patient responsible charge related to this service. The patient expressed understanding and agreed to proceed.   History and Present Illness:  PT calls today for chronic follow up.  Anxiety  Presents for follow-up visit. Symptoms include decreased concentration, depressed mood, excessive worry, irritability, nervous/anxious behavior and restlessness. Symptoms occur most days. The severity of symptoms is moderate. The quality of sleep is good.    Depression         This is a chronic problem.  The current episode started more than 1 year ago.   The onset quality is gradual.   The problem occurs intermittently.  The problem has been waxing and waning since onset.  Associated symptoms include decreased concentration, helplessness, hopelessness, restlessness and sad.  Past treatments include SSRIs - Selective serotonin reuptake inhibitors.  Past medical history includes anxiety.   Arthritis  Presents for follow-up visit. She complains of pain and stiffness. The symptoms have been stable. Affected locations include the right knee, left shoulder and right shoulder (back). Her pain is at a severity of 10/10.  Hyperlipidemia  This is a chronic problem. The current episode started more than 1 year ago. The problem is uncontrolled. Recent lipid tests were reviewed and are high. Exacerbating diseases include obesity.  Current antihyperlipidemic treatment includes statins. The current treatment provides moderate improvement of lipids. Risk factors for coronary artery disease include dyslipidemia, hypertension, a sedentary lifestyle, post-menopausal and obesity.  Gastroesophageal Reflux  She complains of heartburn. She reports no belching. This is a chronic problem. The current episode started more than 1 year ago. The problem occurs occasionally. The problem has been waxing and waning.  COPD Quit smoking 6 years ago. Gets SOB when activity. Would like to try inhaler.     Review of Systems  Constitutional: Positive for irritability.  Gastrointestinal: Positive for heartburn.  Musculoskeletal: Positive for arthritis and stiffness.  Psychiatric/Behavioral: Positive for decreased concentration and depression. The patient is nervous/anxious.   All other systems reviewed and are negative.   Observations/Objective: Intermittent dry nonproductive cough, no distress noted  Assessment and Plan: 1. Benzodiazepine dependence ([place])  2. Arthritis  3. Controlled substance agreement signed  4. Moderate episode of recurrent major depressive disorder (HCC)  - LORazepam (ATIVAN) 0.5 MG tablet; Take 1 tablet (0.5 mg total) by mouth every 12 (twelve) hours as needed for anxiety.  Dispense: 60 tablet; Refill: 5 - FLUoxetine (PROZAC) 40 MG capsule; Take 2 capsules (80 mg total) by mouth daily.  Dispense: 180 capsule; Refill: 1  5. Morbid obesity due to excess calories ([place]) complicated by hyperlipidemia/ preDM  6. Metabolic syndrome  7. Mixed hyperlipidemia   8. History of pulmonary embolus (PE)  9. Gastroesophageal reflux disease, esophagitis presence not specified - famotidine (PEPCID) 20 MG tablet; Take 1 tablet (20 mg total) by mouth at bedtime.  Dispense: 90 tablet; Refill: 3 - pantoprazole (PROTONIX) 20 MG tablet; Take 1 tablet (20 mg  total) by mouth daily.  Dispense: 90 tablet; Refill: 0  10.  Generalized anxiety disorder - buPROPion (WELLBUTRIN XL) 150 MG 24 hr tablet; Take 1 tablet (150 mg total) by mouth daily.  Dispense: 90 tablet; Refill: 2 - LORazepam (ATIVAN) 0.5 MG tablet; Take 1 tablet (0.5 mg total) by mouth every 12 (twelve) hours as needed for anxiety.  Dispense: 60 tablet; Refill: 5 - FLUoxetine (PROZAC) 40 MG capsule; Take 2 capsules (80 mg total) by mouth daily.  Dispense: 180 capsule; Refill: 1  11. Depression, unspecified depression type  12. Acute right-sided low back pain with right-sided sciatica - cyclobenzaprine (FLEXERIL) 10 MG tablet; Take 1 tablet (10 mg total) by mouth 3 (three) times daily as needed. for muscle spams  Dispense: 60 tablet; Refill: 2  13. Chronic obstructive pulmonary disease, unspecified COPD type (HCC) - budesonide-formoterol (SYMBICORT) 80-4.5 MCG/ACT inhaler; Inhale 2 puffs into the lungs 2 (two) times daily.  Dispense: 1 Inhaler; Refill: 3  PT reviewed in [place] controlled database- No red flags noted Continue medications  Will start Symbicort BID today Encourage healthy diet and exercise  Follow Up Instructions: 6 months     I discussed the assessment and treatment plan with the patient. The patient was provided an opportunity to ask questions and all were answered. The patient agreed with the plan and demonstrated an understanding of the instructions.   The patient was advised to call back or seek an in-person evaluation if the symptoms worsen or if the condition fails to improve as anticipated.  The above assessment and management plan was discussed with the patient. The patient verbalized understanding of and has agreed to the management plan. Patient is aware to call the clinic if symptoms persist or worsen. Patient is aware when to return to the clinic for a follow-up visit. Patient educated on when it is appropriate to go to the emergency department.    Call ended 8:40 AM, I provided 32 minutes of non-face-to-face time during  this encounter.    Evelina Dun, FNP

## 2019-03-02 ENCOUNTER — Other Ambulatory Visit: Payer: Self-pay | Admitting: Family

## 2019-03-08 ENCOUNTER — Other Ambulatory Visit: Payer: Self-pay | Admitting: Family

## 2019-04-13 DIAGNOSIS — M1711 Unilateral primary osteoarthritis, right knee: Secondary | ICD-10-CM | POA: Diagnosis not present

## 2019-04-20 DIAGNOSIS — M1711 Unilateral primary osteoarthritis, right knee: Secondary | ICD-10-CM | POA: Diagnosis not present

## 2019-04-27 ENCOUNTER — Other Ambulatory Visit: Payer: Self-pay

## 2019-04-27 ENCOUNTER — Ambulatory Visit (INDEPENDENT_AMBULATORY_CARE_PROVIDER_SITE_OTHER): Payer: PPO | Admitting: *Deleted

## 2019-04-27 DIAGNOSIS — Z0000 Encounter for general adult medical examination without abnormal findings: Secondary | ICD-10-CM | POA: Diagnosis not present

## 2019-04-27 DIAGNOSIS — M1711 Unilateral primary osteoarthritis, right knee: Secondary | ICD-10-CM | POA: Diagnosis not present

## 2019-04-27 NOTE — Patient Instructions (Signed)
Preventive Care 40-64 Years, Female Preventive care refers to lifestyle choices and visits with your health care provider that can promote health and wellness. What does preventive care include?   A yearly physical exam. This is also called an annual well check.  Dental exams once or twice a year.  Routine eye exams. Ask your health care provider how often you should have your eyes checked.  Personal lifestyle choices, including: ? Daily care of your teeth and gums. ? Regular physical activity. ? Eating a healthy diet. ? Avoiding tobacco and drug use. ? Limiting alcohol use. ? Practicing safe sex. ? Taking low-dose aspirin daily starting at age 50. ? Taking vitamin and mineral supplements as recommended by your health care provider. What happens during an annual well check? The services and screenings done by your health care provider during your annual well check will depend on your age, overall health, lifestyle risk factors, and family history of disease. Counseling Your health care provider may ask you questions about your:  Alcohol use.  Tobacco use.  Drug use.  Emotional well-being.  Home and relationship well-being.  Sexual activity.  Eating habits.  Work and work environment.  Method of birth control.  Menstrual cycle.  Pregnancy history. Screening You may have the following tests or measurements:  Height, weight, and BMI.  Blood pressure.  Lipid and cholesterol levels. These may be checked every 5 years, or more frequently if you are over 50 years old.  Skin check.  Lung cancer screening. You may have this screening every year starting at age 55 if you have a 30-pack-year history of smoking and currently smoke or have quit within the past 15 years.  Colorectal cancer screening. All adults should have this screening starting at age 50 and continuing until age 75. Your health care provider may recommend screening at age 45. You will have tests every  1-10 years, depending on your results and the type of screening test. People at increased risk should start screening at an earlier age. Screening tests may include: ? Guaiac-based fecal occult blood testing. ? Fecal immunochemical test (FIT). ? Stool DNA test. ? Virtual colonoscopy. ? Sigmoidoscopy. During this test, a flexible tube with a tiny camera (sigmoidoscope) is used to examine your rectum and lower colon. The sigmoidoscope is inserted through your anus into your rectum and lower colon. ? Colonoscopy. During this test, a long, thin, flexible tube with a tiny camera (colonoscope) is used to examine your entire colon and rectum.  Hepatitis C blood test.  Hepatitis B blood test.  Sexually transmitted disease (STD) testing.  Diabetes screening. This is done by checking your blood sugar (glucose) after you have not eaten for a while (fasting). You may have this done every 1-3 years.  Mammogram. This may be done every 1-2 years. Talk to your health care provider about when you should start having regular mammograms. This may depend on whether you have a family history of breast cancer.  BRCA-related cancer screening. This may be done if you have a family history of breast, ovarian, tubal, or peritoneal cancers.  Pelvic exam and Pap test. This may be done every 3 years starting at age 21. Starting at age 30, this may be done every 5 years if you have a Pap test in combination with an HPV test.  Bone density scan. This is done to screen for osteoporosis. You may have this scan if you are at high risk for osteoporosis. Discuss your test results, treatment options,   and if necessary, the need for more tests with your health care provider. Vaccines Your health care provider may recommend certain vaccines, such as:  Influenza vaccine. This is recommended every year.  Tetanus, diphtheria, and acellular pertussis (Tdap, Td) vaccine. You may need a Td booster every 10 years.  Varicella  vaccine. You may need this if you have not been vaccinated.  Zoster vaccine. You may need this after age 38.  Measles, mumps, and rubella (MMR) vaccine. You may need at least one dose of MMR if you were born in 1957 or later. You may also need a second dose.  Pneumococcal 13-valent conjugate (PCV13) vaccine. You may need this if you have certain conditions and were not previously vaccinated.  Pneumococcal polysaccharide (PPSV23) vaccine. You may need one or two doses if you smoke cigarettes or if you have certain conditions.  Meningococcal vaccine. You may need this if you have certain conditions.  Hepatitis A vaccine. You may need this if you have certain conditions or if you travel or work in places where you may be exposed to hepatitis A.  Hepatitis B vaccine. You may need this if you have certain conditions or if you travel or work in places where you may be exposed to hepatitis B.  Haemophilus influenzae type b (Hib) vaccine. You may need this if you have certain conditions. Talk to your health care provider about which screenings and vaccines you need and how often you need them. This information is not intended to replace advice given to you by your health care provider. Make sure you discuss any questions you have with your health care provider. Document Released: 11/29/2015 Document Revised: 12/23/2017 Document Reviewed: 09/03/2015 Elsevier Interactive Patient Education  2019 [employer].

## 2019-04-27 NOTE — Progress Notes (Addendum)
MEDICARE ANNUAL WELLNESS VISIT  04/27/2019  Telephone Visit Disclaimer This Medicare AWV was conducted by telephone due to national recommendations for restrictions regarding the COVID-19 Pandemic (e.g. social distancing).  I verified, using two identifiers, that I am speaking with Lori Cooper or their authorized healthcare agent. I discussed the limitations, risks, security, and privacy concerns of performing an evaluation and management service by telephone and the potential availability of an in-person appointment in the future. The patient expressed understanding and agreed to proceed.   Subjective:  Lori Cooper is a 63 y.o. female patient of Hawks, Lori Hawthorne, FNP who had a Medicare Annual Wellness Visit today via telephone. Lori Cooper is Disabled and lives with their spouse. she has 1 child. she reports that she is socially active and does interact with friends/family regularly. she is not physically active and enjoys making jewelry and painting.  Patient Care Team: Lori Balloon, FNP as PCP - General (Nurse Practitioner) Lori Artist, MD as Consulting Physician (Gastroenterology) Lori Pear, MD as Consulting Physician (Orthopedic Surgery) Cooper, Lori Pascal, MD as Consulting Physician (Cardiology) Lori Boston, MD as Consulting Physician (General Surgery)  Advanced Directives 04/27/2019 09/15/2018 05/09/2018 01/17/2018 01/16/2018 12/31/2016  Does Patient Have a Medical Advance Directive? No No Yes Yes Yes Yes  Type of Advance Directive - - Healthcare Power of [place]  Does patient want to make changes to medical advance directive? - - No - Patient declined No - Patient declined - -  Copy of [place] in Chart? - - No - copy requested No - copy requested No - copy requested No - copy requested  Would patient like information on creating a medical advance  directive? No - Patient declined No - Patient declined - No - Patient declined No - Patient declined -    Hospital Utilization Over the Past 12 Months: # of hospitalizations or ER visits: 0 # of surgeries: 0  Review of Systems    Patient reports that her overall health is unchanged compared to last year.  Patient Reported Readings (BP, Pulse, CBG, Weight, etc) none  Review of Systems: No complaints  All other systems negative.  Pain Assessment Pain : No/denies pain     Current Medications & Allergies (verified) Allergies as of 04/27/2019   No Known Allergies     Medication List       Accurate as of April 27, 2019  3:55 PM. If you have any questions, ask your nurse or doctor.        STOP taking these medications   BIOFREEZE EX   HYDROcodone-acetaminophen 10-325 MG tablet Commonly known as: NORCO   trolamine salicylate 10 % cream Commonly known as: ASPERCREME     TAKE these medications   acetaminophen 500 MG tablet Commonly known as: TYLENOL Take 500 mg by mouth daily as needed.   albuterol 108 (90 Base) MCG/ACT inhaler Commonly known as: VENTOLIN HFA Inhale 2 puffs into the lungs every 6 (six) hours as needed for wheezing or shortness of breath.   apixaban 5 MG Tabs tablet Commonly known as: ELIQUIS Take 1 tablet (5 mg total) by mouth 2 (two) times daily.   aspirin EC 81 MG tablet Take 81 mg by mouth daily at 2 PM.   atorvastatin 40 MG tablet Commonly known as: LIPITOR Take 1 tablet by mouth once daily   budesonide-formoterol 80-4.5 MCG/ACT inhaler Commonly known as: SYMBICORT  Inhale 2 puffs into the lungs 2 (two) times daily.   buPROPion 150 MG 24 hr tablet Commonly known as: WELLBUTRIN XL Take 1 tablet (150 mg total) by mouth daily.   cyclobenzaprine 10 MG tablet Commonly known as: FLEXERIL Take 1 tablet (10 mg total) by mouth 3 (three) times daily as needed. for muscle spams   famotidine 20 MG tablet Commonly known as: Pepcid Take 1  tablet (20 mg total) by mouth at bedtime.   FLUoxetine 40 MG capsule Commonly known as: PROZAC Take 2 capsules (80 mg total) by mouth daily.   LORazepam 0.5 MG tablet Commonly known as: ATIVAN Take 1 tablet (0.5 mg total) by mouth every 12 (twelve) hours as needed for anxiety.   pantoprazole 20 MG tablet Commonly known as: PROTONIX Take 1 tablet (20 mg total) by mouth daily.   VITAMIN D3 PO Take 2 tablets by mouth daily at 2 PM.       History (reviewed): Past Medical History:  Diagnosis Date  . Anxiety   . Chronic back pain   . DDD (degenerative disc disease), cervical   . DDD (degenerative disc disease), lumbosacral   . Depression   . GERD (gastroesophageal reflux disease)   . Hiatal hernia   . History of adenomatous polyp of colon    2009  tubular adenoma  . History of esophagitis   . History of idiopathic seizure    1984  x1 after vaginal delivery (per pt negative work-up and no issue since)  . History of left shoulder fracture    01/ 2014  proximal humerus fx  . Hyperlipidemia   . OA (osteoarthritis)    knees and thumbs  . RLS (restless legs syndrome)   . Supraumbilical hernia   . Umbilical hernia   . Wears dentures    lower   Past Surgical History:  Procedure Laterality Date  . CARDIOVASCULAR STRESS TEST  05/06/2010   Lexiscan nuclear study w/ no exercise/  probably normal , per images there is a large reversible defect in the mid to distal anterior wall, this seems to be shifting breast attenuation/  normal LV function and wall motion , ef 67%  . CARPAL TUNNEL RELEASE Bilateral 2011   excision ganglion cyst left wrist  . CATARACT EXTRACTION W/ INTRAOCULAR LENS  IMPLANT, BILATERAL  09 and 10/ 2017  . COLONOSCOPY  05/08/2008  . COLONOSCOPY N/A 09/15/2018   Procedure: COLONOSCOPY;  Surgeon: Rogene Houston, MD;  [facility];  Service: Endoscopy;  Laterality: N/A;  12:00  . LAPAROSCOPIC CHOLECYSTECTOMY  2012  . SHOULDER ARTHROSCOPY/   ACROMIOPLASTY/  DISTAL CLAVICAL RESECTION/  DEBRIDEMENT LABRAL TEAR Left 02/09/2005  . TONSILLECTOMY AND ADENOIDECTOMY  age 68  . TUBAL LIGATION Bilateral yrs ago  . VENTRAL HERNIA REPAIR N/A 12/31/2016   Procedure: LAPAROSCOPIC VENTRAL WALL HERNIA REPAIR WITH ERAS PATHWAY;  Surgeon: Lori Boston, MD;  Location: [facility];  Service: General;  Laterality: N/A;   Family History  Problem Relation Age of Onset  . Hypertension Mother   . Kidney disease Mother   . Diabetes Mother   . Pulmonary embolism Father 63  . Obesity Sister    Social History   Socioeconomic History  . Marital status: Married    Spouse name: Juanda Crumble  . Number of children: 1  . Years of education: 46  . Highest education level: GED or equivalent  Occupational History  . Occupation: diability  Social Needs  . Financial resource strain: Not  hard at all  . Food insecurity    Worry: Never true    Inability: Never true  . Transportation needs    Medical: No    Non-medical: No  Tobacco Use  . Smoking status: Former Smoker    Packs/day: 0.50    Years: 40.00    Pack years: 20.00    Types: Cigarettes    Start date: 02/20/1973    Quit date: 06/16/2013    Years since quitting: 5.8  . Smokeless tobacco: Never Used  Substance and Sexual Activity  . Alcohol use: No  . Drug use: No  . Sexual activity: Not Currently  Lifestyle  . Physical activity    Days per week: 0 days    Minutes per session: 0 min  . Stress: Not at all  Relationships  . Social connections    Talks on phone: More than three times a week    Gets together: Three times a week    Attends religious service: Never    Active member of club or organization: No    Attends meetings of clubs or organizations: Never    Relationship status: Married  Other Topics Concern  . Not on file  Social History Narrative  . Not on file    Activities of Daily Living In your present state of health, do you have any difficulty performing the  following activities: 04/27/2019  Hearing? N  Vision? 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 N  Walking or climbing stairs? N  Dressing or bathing? N  Doing errands, shopping? N  Preparing Food and eating ? N  Using the Toilet? N  In the past six months, have you accidently leaked urine? Y  Comment wears pads  Do you have problems with loss of bowel control? N  Managing your Medications? N  Managing your Finances? N  Housekeeping or managing your Housekeeping? N  Some recent data might be hidden    Patient Literacy How often do you need to have someone help you when you read instructions, pamphlets, or other written materials from your doctor or pharmacy?: 1 - Never What is the last grade level you completed in school?: 12th grade  Exercise Current Exercise Habits: The patient does not participate in regular exercise at present, Exercise limited by: respiratory conditions(s);orthopedic condition(s)  Diet Patient reports consuming 2 meals a day and 1 snack(s) a day Patient reports that her primary diet is: Regular Patient reports that she does have regular access to food.   Depression Screen PHQ 2/9 Scores 04/27/2019 08/12/2018 05/10/2018 05/09/2018 02/18/2018 01/26/2018 01/10/2018  PHQ - 2 Score 0 2 2 1 1 2 2   PHQ- 9 Score - 8 8 - - 12 14     Fall Risk Fall Risk  04/27/2019 05/10/2018 09/24/2017 03/23/2017 02/19/2017  Falls in the past year? 0 No No No No     Objective:  Lori Cooper seemed alert and oriented and she participated appropriately during our telephone visit.  Blood Pressure Weight BMI  BP Readings from Last 3 Encounters:  09/15/18 114/71  08/12/18 121/67  07/13/18 (!) 160/92   Wt Readings from Last 3 Encounters:  08/12/18 239 lb 6.4 oz (108.6 kg)  07/13/18 237 lb 1.6 oz (107.5 kg)  05/10/18 233 lb 12.8 oz (106.1 kg)   BMI Readings from Last 1 Encounters:  08/12/18 39.84 kg/m    *Unable to obtain current  vital signs, weight, and BMI due to telephone  visit type  Hearing/Vision  . Klee did not seem to have difficulty with hearing/understanding during the telephone conversation . Reports that she has not had a formal eye exam by an eye care professional within the past year . Reports that she has not had a formal hearing evaluation within the past year *Unable to fully assess hearing and vision during telephone visit type  Cognitive Function: 6CIT Screen 04/27/2019  What Year? 0 points  What month? 0 points  What time? 0 points  Count back from 20 0 points  Months in reverse 0 points  Repeat phrase 2 points  Total Score 2   (Normal:0-7, Significant for Dysfunction: >8)  Normal Cognitive Function Screening: Yes   Immunization & Health Maintenance Record Immunization History  Administered Date(s) Administered  . Influenza,inj,Quad PF,6+ Mos 09/21/2013, 08/17/2014, 08/22/2015, 08/21/2016, 09/24/2017  . Influenza,inj,quad, With Preservative 09/16/2017, 08/16/2018  . Pneumococcal Conjugate-13 07/16/2015  . Pneumococcal Polysaccharide-23 07/18/2011  . Tdap 07/18/2011  . Zoster 09/17/2011    Health Maintenance  Topic Date Due  . MAMMOGRAM  01/13/2017  . INFLUENZA VACCINE  06/17/2019  . PAP SMEAR-Modifier  03/23/2020  . TETANUS/TDAP  07/17/2021  . COLONOSCOPY  09/16/2023  . Hepatitis C Screening  Completed  . HIV Screening  Completed       Assessment  This is a routine wellness examination for Lori Cooper.  Health Maintenance: Due or Overdue Health Maintenance Due  Topic Date Due  . MAMMOGRAM  01/13/2017    Lori Cooper does not need a referral for Community Assistance: Care Management:   no Social Work:    no Prescription Assistance:  no Nutrition/Diabetes Education:  no   Plan:  Personalized Goals Goals Addressed            This Visit's Progress   . Exercise 3x per week (30 min per time) (pt-stated)       Pt would like to be able to ride  her stationary bike daily for 30 minutes       Personalized Health Maintenance & Screening Recommendations  Screening mammography  Lung Cancer Screening Recommended: no (Low Dose CT Chest recommended if Age 24-80 years, 30 pack-year currently smoking OR have quit w/in past 15 years) Hepatitis C Screening recommended: no HIV Screening recommended: no  Advanced Directives: Written information was not prepared per patient's request.  Referrals & Orders No orders of the defined types were placed in this encounter.   Follow-up Plan . Follow-up with Lori Balloon, FNP as planned . Schedule screening mammogram     I have personally reviewed and noted the following in the patient's chart:   . Medical and social history . Use of alcohol, tobacco or illicit drugs  . Current medications and supplements . Functional ability and status . Nutritional status . Physical activity . Advanced directives . List of other physicians . Hospitalizations, surgeries, and ER visits in previous 12 months . Vitals . Screenings to include cognitive, depression, and falls . Referrals and appointments  In addition, I have reviewed and discussed with Lori Cooper certain preventive protocols, quality metrics, and best practice recommendations. A written personalized care plan for preventive services as well as general preventive health recommendations is available and can be mailed to the patient at her request.      Marylin Crosby, LPN  6/75/9163   I have reviewed and agree with the above AWV documentation.   Evelina Dun, FNP

## 2019-05-08 ENCOUNTER — Ambulatory Visit: Payer: PPO

## 2019-05-30 ENCOUNTER — Other Ambulatory Visit: Payer: Self-pay | Admitting: Family

## 2019-05-30 DIAGNOSIS — K219 Gastro-esophageal reflux disease without esophagitis: Secondary | ICD-10-CM

## 2019-05-31 ENCOUNTER — Other Ambulatory Visit: Payer: Self-pay

## 2019-06-01 ENCOUNTER — Encounter: Payer: Self-pay | Admitting: Family

## 2019-06-01 ENCOUNTER — Ambulatory Visit (INDEPENDENT_AMBULATORY_CARE_PROVIDER_SITE_OTHER): Payer: PPO | Admitting: Family

## 2019-06-01 VITALS — BP 134/70 | HR 70 | Temp 97.3°F | Ht 65.0 in | Wt 234.8 lb

## 2019-06-01 DIAGNOSIS — K219 Gastro-esophageal reflux disease without esophagitis: Secondary | ICD-10-CM | POA: Diagnosis not present

## 2019-06-01 DIAGNOSIS — E559 Vitamin D deficiency, unspecified: Secondary | ICD-10-CM | POA: Diagnosis not present

## 2019-06-01 DIAGNOSIS — F331 Major depressive disorder, recurrent, moderate: Secondary | ICD-10-CM | POA: Diagnosis not present

## 2019-06-01 DIAGNOSIS — E8881 Metabolic syndrome: Secondary | ICD-10-CM

## 2019-06-01 DIAGNOSIS — F1320 Sedative, hypnotic or anxiolytic dependence, uncomplicated: Secondary | ICD-10-CM | POA: Diagnosis not present

## 2019-06-01 DIAGNOSIS — M1990 Unspecified osteoarthritis, unspecified site: Secondary | ICD-10-CM

## 2019-06-01 DIAGNOSIS — F411 Generalized anxiety disorder: Secondary | ICD-10-CM | POA: Diagnosis not present

## 2019-06-01 DIAGNOSIS — Z79899 Other long term (current) drug therapy: Secondary | ICD-10-CM

## 2019-06-01 DIAGNOSIS — E782 Mixed hyperlipidemia: Secondary | ICD-10-CM

## 2019-06-01 DIAGNOSIS — Z86711 Personal history of pulmonary embolism: Secondary | ICD-10-CM | POA: Diagnosis not present

## 2019-06-01 MED ORDER — LORAZEPAM 0.5 MG PO TABS: 0.5000 mg | ORAL_TABLET | Freq: Two times a day (BID) | ORAL | 5 refills | Status: DC | PRN

## 2019-06-01 NOTE — Progress Notes (Signed)
Subjective:    Patient ID: Lori Cooper, female    DOB: September 03, 1956, 63 y.o.   MRN: [id number]  Chief Complaint  Patient presents with  . Medical Management of Chronic Issues   PT presents to the office today for chronic follow up. Pt has hx of PE. She is followed by Ortho and is getting bilateral knee injections.  Anxiety Presents for follow-up visit. Symptoms include decreased concentration, excessive worry, irritability, nervous/anxious behavior and restlessness. Symptoms occur most days. The severity of symptoms is moderate. The quality of sleep is good.    Gastroesophageal Reflux She complains of belching, coughing and heartburn. This is a chronic problem. The current episode started more than 1 year ago. The problem occurs occasionally. The problem has been waxing and waning. The symptoms are aggravated by certain foods. She has tried a PPI for the symptoms. The treatment provided moderate relief.  Arthritis Presents for follow-up visit. She complains of pain and stiffness. The symptoms have been stable. Affected locations include the right knee, left knee, right MCP and left MCP (back). Her pain is at a severity of 6/10.  Hyperlipidemia This is a chronic problem. The current episode started more than 1 year ago. The problem is uncontrolled. Recent lipid tests were reviewed and are normal. Exacerbating diseases include obesity. Current antihyperlipidemic treatment includes statins. The current treatment provides moderate improvement of lipids. Risk factors for coronary artery disease include dyslipidemia, hypertension, a sedentary lifestyle and post-menopausal.  Depression        This is a chronic problem.  The current episode started more than 1 year ago.   The onset quality is gradual.   The problem occurs intermittently.  Associated symptoms include decreased concentration, restlessness and sad.  Associated symptoms include no helplessness and no hopelessness.  Past medical  history includes anxiety.     Current opioids rx- Ativan 0.5 mg BID prn # meds rx- 60 Effectiveness of current meds-Stable  Adverse reactions form pain meds-None Morphine equivalent- 1.0 lme/day  Pill count performed-Yes Last drug screen - 08/12/18 ( high risk q59m moderate risk q622mlow risk yearly ) Urine drug screen today- Yes Was the NCSpotswoodeviewed- Yes  If yes were their any concerning findings? - None  No flowsheet data found.   Pain contract signed on: 08/12/18    Review of Systems  Constitutional: Positive for irritability.  Respiratory: Positive for cough.   Gastrointestinal: Positive for heartburn.  Musculoskeletal: Positive for arthritis and stiffness.  Psychiatric/Behavioral: Positive for decreased concentration and depression. The patient is nervous/anxious.   All other systems reviewed and are negative.      Objective:   Physical Exam Vitals signs reviewed.  Constitutional:      General: She is not in acute distress.    Appearance: She is well-developed. She is obese.  HENT:     Head: Normocephalic and atraumatic.     Right Ear: External ear normal.  Eyes:     Pupils: Pupils are equal, round, and reactive to light.  Neck:     Musculoskeletal: Normal range of motion and neck supple.     Thyroid: No thyromegaly.  Cardiovascular:     Rate and Rhythm: Normal rate and regular rhythm.     Heart sounds: Normal heart sounds. No murmur.  Pulmonary:     Effort: Pulmonary effort is normal. No respiratory distress.     Breath sounds: Normal breath sounds. No wheezing.  Abdominal:     General: Bowel sounds are normal. There  is no distension.     Palpations: Abdomen is soft.     Tenderness: There is no abdominal tenderness.  Musculoskeletal:        General: No tenderness.     Comments: Pain in lumbar with flexion, pain in bilateral knees with extension  Skin:    General: Skin is warm and dry.  Neurological:     Mental Status: She is alert and oriented to  person, place, and time.     Cranial Nerves: No cranial nerve deficit.     Deep Tendon Reflexes: Reflexes are normal and symmetric.  Psychiatric:        Behavior: Behavior normal.        Thought Content: Thought content normal.        Judgment: Judgment normal.       BP 134/70   Pulse 70   Temp (!) 97.3 F (36.3 C) (Oral)   Ht '5\' 5"'$  (1.651 m)   Wt 234 lb 12.8 oz (106.5 kg)   BMI 39.07 kg/m      Assessment & Plan:  [name] comes in today with chief complaint of Medical Management of Chronic Issues   Diagnosis and orders addressed:  1. Gastroesophageal reflux disease, esophagitis presence not specified - CMP14+EGFR - CBC with Differential/Platelet  2. Arthritis - CMP14+EGFR - CBC with Differential/Platelet  3. Vitamin D deficiency - CMP14+EGFR - CBC with Differential/Platelet  4. Benzodiazepine dependence (HCC) - CMP14+EGFR - CBC with Differential/Platelet  5. Controlled substance agreement signed - CMP14+EGFR - CBC with Differential/Platelet  6. Moderate episode of recurrent major depressive disorder (HCC)  - LORazepam (ATIVAN) 0.5 MG tablet; Take 1 tablet (0.5 mg total) by mouth every 12 (twelve) hours as needed for anxiety.  Dispense: 60 tablet; Refill: 5 - CMP14+EGFR - CBC with Differential/Platelet  7. Generalized anxiety disorder - LORazepam (ATIVAN) 0.5 MG tablet; Take 1 tablet (0.5 mg total) by mouth every 12 (twelve) hours as needed for anxiety.  Dispense: 60 tablet; Refill: 5 - CMP14+EGFR - CBC with Differential/Platelet  8. History of pulmonary embolus (PE)  - CMP14+EGFR - CBC with Differential/Platelet  9. Mixed hyperlipidemia  - CMP14+EGFR - CBC with Differential/Platelet - Lipid panel  10. Metabolic syndrome - CLE75+TZGY - CBC with Differential/Platelet - Lipid panel  11. Morbid obesity due to excess calories ([place]) complicated by hyperlipidemia/ preDM - CMP14+EGFR - CBC with Differential/Platelet   Labs pending  Health Maintenance reviewed Diet and exercise encouraged  Follow up plan: 6 months    Evelina Dun, FNP

## 2019-06-01 NOTE — Patient Instructions (Signed)

## 2019-06-02 LAB — CBC WITH DIFFERENTIAL/PLATELET
Basophils Absolute: 0.1 10*3/uL (ref 0.0–0.2)
Basos: 2 %
EOS (ABSOLUTE): 0.2 10*3/uL (ref 0.0–0.4)
Eos: 4 %
Hematocrit: 40.9 % (ref 34.0–46.6)
Hemoglobin: 13 g/dL (ref 11.1–15.9)
Immature Grans (Abs): 0 10*3/uL (ref 0.0–0.1)
Immature Granulocytes: 0 %
Lymphocytes Absolute: 2.5 10*3/uL (ref 0.7–3.1)
Lymphs: 44 %
MCH: 28.3 pg (ref 26.6–33.0)
MCHC: 31.8 g/dL (ref 31.5–35.7)
MCV: 89 fL (ref 79–97)
Monocytes Absolute: 0.5 10*3/uL (ref 0.1–0.9)
Monocytes: 9 %
Neutrophils Absolute: 2.3 10*3/uL (ref 1.4–7.0)
Neutrophils: 41 %
Platelets: 304 10*3/uL (ref 150–450)
RBC: 4.59 x10E6/uL (ref 3.77–5.28)
RDW: 12.1 % (ref 11.7–15.4)
WBC: 5.5 10*3/uL (ref 3.4–10.8)

## 2019-06-02 LAB — CMP14+EGFR
ALT: 16 IU/L (ref 0–32)
AST: 17 IU/L (ref 0–40)
Albumin/Globulin Ratio: 1.8 (ref 1.2–2.2)
Albumin: 4 g/dL (ref 3.8–4.8)
Alkaline Phosphatase: 106 IU/L (ref 39–117)
BUN/Creatinine Ratio: 9 — ABNORMAL LOW (ref 12–28)
BUN: 8 mg/dL (ref 8–27)
Bilirubin Total: 0.3 mg/dL (ref 0.0–1.2)
CO2: 23 mmol/L (ref 20–29)
Calcium: 9.2 mg/dL (ref 8.7–10.3)
Chloride: 103 mmol/L (ref 96–106)
Creatinine, Ser: 0.9 mg/dL (ref 0.57–1.00)
GFR calc Af Amer: 78 mL/min/{1.73_m2} (ref >59)
GFR calc non Af Amer: 68 mL/min/{1.73_m2} (ref >59)
Globulin, Total: 2.2 g/dL (ref 1.5–4.5)
Glucose: 98 mg/dL (ref 65–99)
Potassium: 4.2 mmol/L (ref 3.5–5.2)
Sodium: 141 mmol/L (ref 134–144)
Total Protein: 6.2 g/dL (ref 6.0–8.5)

## 2019-06-02 LAB — LIPID PANEL
Chol/HDL Ratio: 3 ratio (ref 0.0–4.4)
Cholesterol, Total: 181 mg/dL (ref 100–199)
HDL: 60 mg/dL (ref >39)
LDL Calculated: 96 mg/dL (ref 0–99)
Triglycerides: 125 mg/dL (ref 0–149)
VLDL Cholesterol Cal: 25 mg/dL (ref 5–40)

## 2019-06-03 ENCOUNTER — Other Ambulatory Visit: Payer: Self-pay | Admitting: Family

## 2019-06-08 ENCOUNTER — Other Ambulatory Visit: Payer: Self-pay | Admitting: Family

## 2019-06-08 DIAGNOSIS — F411 Generalized anxiety disorder: Secondary | ICD-10-CM

## 2019-06-08 DIAGNOSIS — F331 Major depressive disorder, recurrent, moderate: Secondary | ICD-10-CM

## 2019-06-08 NOTE — Telephone Encounter (Signed)
OV 06/01/19 rtc 6 mos

## 2019-06-30 ENCOUNTER — Other Ambulatory Visit: Payer: Self-pay | Admitting: Family

## 2019-07-11 DIAGNOSIS — H524 Presbyopia: Secondary | ICD-10-CM | POA: Diagnosis not present

## 2019-07-26 ENCOUNTER — Other Ambulatory Visit: Payer: Self-pay

## 2019-07-27 ENCOUNTER — Ambulatory Visit (INDEPENDENT_AMBULATORY_CARE_PROVIDER_SITE_OTHER): Payer: PPO | Admitting: Family

## 2019-07-27 DIAGNOSIS — Z23 Encounter for immunization: Secondary | ICD-10-CM | POA: Diagnosis not present

## 2019-07-27 NOTE — Progress Notes (Signed)
Patient scheduled through [facility] for a flu immunization.  It was erroneously placed in provider's schedule.  Injection was given and documented.

## 2019-08-01 ENCOUNTER — Other Ambulatory Visit: Payer: Self-pay | Admitting: Family

## 2019-08-01 DIAGNOSIS — J449 Chronic obstructive pulmonary disease, unspecified: Secondary | ICD-10-CM

## 2019-08-30 ENCOUNTER — Other Ambulatory Visit: Payer: Self-pay | Admitting: Family

## 2019-08-30 DIAGNOSIS — F411 Generalized anxiety disorder: Secondary | ICD-10-CM

## 2019-08-30 DIAGNOSIS — F331 Major depressive disorder, recurrent, moderate: Secondary | ICD-10-CM

## 2019-09-02 ENCOUNTER — Other Ambulatory Visit: Payer: Self-pay | Admitting: Family

## 2019-09-02 DIAGNOSIS — K219 Gastro-esophageal reflux disease without esophagitis: Secondary | ICD-10-CM

## 2019-09-07 ENCOUNTER — Other Ambulatory Visit: Payer: Self-pay | Admitting: Family

## 2019-09-25 DIAGNOSIS — H35361 Drusen (degenerative) of macula, right eye: Secondary | ICD-10-CM | POA: Diagnosis not present

## 2019-09-25 DIAGNOSIS — H40013 Open angle with borderline findings, low risk, bilateral: Secondary | ICD-10-CM | POA: Diagnosis not present

## 2019-09-25 DIAGNOSIS — H26493 Other secondary cataract, bilateral: Secondary | ICD-10-CM | POA: Diagnosis not present

## 2019-09-25 DIAGNOSIS — H43813 Vitreous degeneration, bilateral: Secondary | ICD-10-CM | POA: Diagnosis not present

## 2019-09-25 DIAGNOSIS — H26492 Other secondary cataract, left eye: Secondary | ICD-10-CM | POA: Diagnosis not present

## 2019-09-25 LAB — HM DIABETES EYE EXAM

## 2019-10-30 ENCOUNTER — Other Ambulatory Visit: Payer: Self-pay | Admitting: *Deleted

## 2019-10-30 NOTE — Patient Outreach (Signed)
[facility]) Care Management  10/30/2019  Lori Cooper September 20, 1956 [id number]   Case reviewed, no patient outreach needed,  and case closed per Bary Castilla, Assistant Clinical Director at [facility]  request.   Colbert Coyer. Annia Friendly, BSN, [place] Management [facility] Telephonic CM Phone: [phone] Fax: [phone]

## 2019-12-01 ENCOUNTER — Other Ambulatory Visit: Payer: Self-pay

## 2019-12-01 ENCOUNTER — Other Ambulatory Visit: Payer: Self-pay | Admitting: Family

## 2019-12-01 DIAGNOSIS — F331 Major depressive disorder, recurrent, moderate: Secondary | ICD-10-CM

## 2019-12-01 DIAGNOSIS — F411 Generalized anxiety disorder: Secondary | ICD-10-CM

## 2019-12-04 ENCOUNTER — Ambulatory Visit (INDEPENDENT_AMBULATORY_CARE_PROVIDER_SITE_OTHER): Payer: PPO | Admitting: Family

## 2019-12-04 ENCOUNTER — Other Ambulatory Visit: Payer: Self-pay

## 2019-12-04 ENCOUNTER — Encounter: Payer: Self-pay | Admitting: Family

## 2019-12-04 VITALS — BP 127/77 | HR 89 | Temp 96.6°F | Ht 65.0 in | Wt 236.0 lb

## 2019-12-04 DIAGNOSIS — R7303 Prediabetes: Secondary | ICD-10-CM | POA: Diagnosis not present

## 2019-12-04 DIAGNOSIS — M1990 Unspecified osteoarthritis, unspecified site: Secondary | ICD-10-CM | POA: Diagnosis not present

## 2019-12-04 DIAGNOSIS — F1320 Sedative, hypnotic or anxiolytic dependence, uncomplicated: Secondary | ICD-10-CM

## 2019-12-04 DIAGNOSIS — Z79899 Other long term (current) drug therapy: Secondary | ICD-10-CM

## 2019-12-04 DIAGNOSIS — F411 Generalized anxiety disorder: Secondary | ICD-10-CM | POA: Diagnosis not present

## 2019-12-04 DIAGNOSIS — K219 Gastro-esophageal reflux disease without esophagitis: Secondary | ICD-10-CM

## 2019-12-04 DIAGNOSIS — R5383 Other fatigue: Secondary | ICD-10-CM

## 2019-12-04 DIAGNOSIS — J449 Chronic obstructive pulmonary disease, unspecified: Secondary | ICD-10-CM | POA: Diagnosis not present

## 2019-12-04 DIAGNOSIS — G4700 Insomnia, unspecified: Secondary | ICD-10-CM

## 2019-12-04 DIAGNOSIS — E782 Mixed hyperlipidemia: Secondary | ICD-10-CM

## 2019-12-04 DIAGNOSIS — E559 Vitamin D deficiency, unspecified: Secondary | ICD-10-CM

## 2019-12-04 DIAGNOSIS — F331 Major depressive disorder, recurrent, moderate: Secondary | ICD-10-CM | POA: Diagnosis not present

## 2019-12-04 MED ORDER — BUSPIRONE HCL 5 MG PO TABS: 5.0000 mg | ORAL_TABLET | Freq: Three times a day (TID) | ORAL | 1 refills | Status: DC | PRN

## 2019-12-04 MED ORDER — LORAZEPAM 0.5 MG PO TABS: 0.5000 mg | ORAL_TABLET | Freq: Two times a day (BID) | ORAL | 5 refills | Status: DC | PRN

## 2019-12-04 MED ORDER — TRAZODONE HCL 50 MG PO TABS: 50.0000 mg | ORAL_TABLET | Freq: Every evening | ORAL | 2 refills | Status: DC | PRN

## 2019-12-04 NOTE — Patient Instructions (Signed)

## 2019-12-04 NOTE — Progress Notes (Signed)
Subjective:    Patient ID: Lori Cooper, female    DOB: 05-29-1956, 64 y.o.   MRN: [id number]  Chief Complaint  Patient presents with  . Medical Management of Chronic Issues    Gastroesophageal Reflux She complains of belching and heartburn. This is a chronic problem. The current episode started more than 1 year ago. The problem occurs occasionally. The problem has been waxing and waning. Risk factors include obesity. She has tried a PPI for the symptoms. The treatment provided moderate relief.  Arthritis Presents for follow-up visit. She complains of pain and stiffness. The symptoms have been stable. Affected locations include the right knee, left knee, right hip and left hip. Her pain is at a severity of 6/10.  Hyperlipidemia This is a chronic problem. The current episode started more than 1 year ago. The problem is controlled. Recent lipid tests were reviewed and are normal. Exacerbating diseases include obesity. Current antihyperlipidemic treatment includes statins. The current treatment provides moderate improvement of lipids. Risk factors for coronary artery disease include hypertension, post-menopausal and a sedentary lifestyle.  Depression        This is a chronic problem.  The current episode started more than 1 year ago.   The onset quality is gradual.   The problem occurs intermittently.  Associated symptoms include decreased concentration and insomnia.  Past medical history includes anxiety.   Anxiety Presents for follow-up visit. Symptoms include decreased concentration, excessive worry, insomnia, irritability, nervous/anxious behavior and panic.    Insomnia Primary symptoms: difficulty falling asleep.  The current episode started more than one year. The onset quality is gradual. The problem occurs intermittently. PMH includes: depression.  COPD Hx of smoking, quit smoking 6 years ago. Taking Symbicort BID. States wearing the mask and walking any distance makes her SOB  worse.   HX PE Taking Eliquis daily. Stable.     Review of Systems  Constitutional: Positive for irritability.  Gastrointestinal: Positive for heartburn.  Musculoskeletal: Positive for arthritis and stiffness.  Psychiatric/Behavioral: Positive for decreased concentration and depression. The patient is nervous/anxious and has insomnia.   All other systems reviewed and are negative.      Objective:   Physical Exam Vitals reviewed.  Constitutional:      General: She is not in acute distress.    Appearance: She is well-developed. She is obese.  HENT:     Head: Normocephalic and atraumatic.     Right Ear: Tympanic membrane normal.     Left Ear: Tympanic membrane normal.  Eyes:     Pupils: Pupils are equal, round, and reactive to light.  Neck:     Thyroid: No thyromegaly.  Cardiovascular:     Rate and Rhythm: Normal rate and regular rhythm.     Heart sounds: Normal heart sounds. No murmur.  Pulmonary:     Effort: Pulmonary effort is normal. No respiratory distress.     Breath sounds: No wheezing.  Abdominal:     General: Bowel sounds are normal. There is no distension.     Palpations: Abdomen is soft.     Tenderness: There is no abdominal tenderness.  Musculoskeletal:        General: No tenderness. Normal range of motion.     Cervical back: Normal range of motion and neck supple.  Skin:    General: Skin is warm and dry.  Neurological:     Mental Status: She is alert and oriented to person, place, and time.     Cranial Nerves: No  cranial nerve deficit.     Deep Tendon Reflexes: Reflexes are normal and symmetric.  Psychiatric:        Behavior: Behavior normal.        Thought Content: Thought content normal.        Judgment: Judgment normal.       BP 127/77   Pulse 89   Temp (!) 96.6 F (35.9 C) (Temporal)   Ht '5\' 5"'$  (1.651 m)   Wt 236 lb (107 kg)   SpO2 97%   BMI 39.27 kg/m      Assessment & Plan:  Lori Cooper comes in today with chief complaint  of Medical Management of Chronic Issues   Diagnosis and orders addressed:  1. Gastroesophageal reflux disease, unspecified whether esophagitis present - CMP14+EGFR - CBC with Differential/Platelet  2. Arthritis - CMP14+EGFR - CBC with Differential/Platelet  3. Mixed hyperlipidemia - CMP14+EGFR - CBC with Differential/Platelet - Lipid panel  4. Generalized anxiety disorder Will add Buspar 5 mg as needed, will try this instead of Ativan - busPIRone (BUSPAR) 5 MG tablet; Take 1 tablet (5 mg total) by mouth 3 (three) times daily as needed.  Dispense: 90 tablet; Refill: 1 - LORazepam (ATIVAN) 0.5 MG tablet; Take 1 tablet (0.5 mg total) by mouth every 12 (twelve) hours as needed for anxiety.  Dispense: 60 tablet; Refill: 5 - CMP14+EGFR - CBC with Differential/Platelet - TSH  5. Vitamin D deficiency - CMP14+EGFR - CBC with Differential/Platelet  6. Morbid obesity due to excess calories ([place]) complicated by hyperlipidemia/ preDM - CMP14+EGFR - CBC with Differential/Platelet  7. Moderate episode of recurrent major depressive disorder (HCC) - busPIRone (BUSPAR) 5 MG tablet; Take 1 tablet (5 mg total) by mouth 3 (three) times daily as needed.  Dispense: 90 tablet; Refill: 1 - LORazepam (ATIVAN) 0.5 MG tablet; Take 1 tablet (0.5 mg total) by mouth every 12 (twelve) hours as needed for anxiety.  Dispense: 60 tablet; Refill: 5 - CMP14+EGFR - CBC with Differential/Platelet  8. Benzodiazepine dependence (HCC) - LORazepam (ATIVAN) 0.5 MG tablet; Take 1 tablet (0.5 mg total) by mouth every 12 (twelve) hours as needed for anxiety.  Dispense: 60 tablet; Refill: 5 - CMP14+EGFR - CBC with Differential/Platelet - DRUG SCREEN-TOXASSURE  9. Controlled substance agreement signed - LORazepam (ATIVAN) 0.5 MG tablet; Take 1 tablet (0.5 mg total) by mouth every 12 (twelve) hours as needed for anxiety.  Dispense: 60 tablet; Refill: 5 - CMP14+EGFR - CBC with Differential/Platelet - DRUG  SCREEN-TOXASSURE  10. Prediabetes - CMP14+EGFR - CBC with Differential/Platelet  11. Insomnia, unspecified type Will try trazodone instead of Ativan - traZODone (DESYREL) 50 MG tablet; Take 1-2 tablets (50-100 mg total) by mouth at bedtime as needed for sleep.  Dispense: 60 tablet; Refill: 2 - CMP14+EGFR - CBC with Differential/Platelet  12. Chronic obstructive pulmonary disease, unspecified COPD type ([place]) - CMP14+EGFR - CBC with Differential/Platelet  13. Other fatigue - CMP14+EGFR - CBC with Differential/Platelet - TSH  Long discussion with patient about Ativan and controlled medication. Will try Buspar as needed for anxiety and trazodone for sleep. Pt reviewed in  controlled database. No red flags noted. She will try these medications and try to wean herself off the Ativan.  Labs pending Health Maintenance reviewed Diet and exercise encouraged  Follow up plan: 6 months   Evelina Dun, FNP

## 2019-12-05 LAB — CMP14+EGFR
ALT: 30 IU/L (ref 0–32)
AST: 31 IU/L (ref 0–40)
Albumin/Globulin Ratio: 1.8 (ref 1.2–2.2)
Albumin: 4.2 g/dL (ref 3.8–4.8)
Alkaline Phosphatase: 139 IU/L — ABNORMAL HIGH (ref 39–117)
BUN/Creatinine Ratio: 9 — ABNORMAL LOW (ref 12–28)
BUN: 9 mg/dL (ref 8–27)
Bilirubin Total: 0.6 mg/dL (ref 0.0–1.2)
CO2: 23 mmol/L (ref 20–29)
Calcium: 9.7 mg/dL (ref 8.7–10.3)
Chloride: 101 mmol/L (ref 96–106)
Creatinine, Ser: 0.99 mg/dL (ref 0.57–1.00)
GFR calc Af Amer: 69 mL/min/{1.73_m2} (ref >59)
GFR calc non Af Amer: 60 mL/min/{1.73_m2} (ref >59)
Globulin, Total: 2.4 g/dL (ref 1.5–4.5)
Glucose: 113 mg/dL — ABNORMAL HIGH (ref 65–99)
Potassium: 4.4 mmol/L (ref 3.5–5.2)
Sodium: 139 mmol/L (ref 134–144)
Total Protein: 6.6 g/dL (ref 6.0–8.5)

## 2019-12-05 LAB — CBC WITH DIFFERENTIAL/PLATELET
Basophils Absolute: 0.1 10*3/uL (ref 0.0–0.2)
Basos: 1 %
EOS (ABSOLUTE): 0.2 10*3/uL (ref 0.0–0.4)
Eos: 2 %
Hematocrit: 42.5 % (ref 34.0–46.6)
Hemoglobin: 13.4 g/dL (ref 11.1–15.9)
Immature Grans (Abs): 0 10*3/uL (ref 0.0–0.1)
Immature Granulocytes: 0 %
Lymphocytes Absolute: 2.8 10*3/uL (ref 0.7–3.1)
Lymphs: 35 %
MCH: 28 pg (ref 26.6–33.0)
MCHC: 31.5 g/dL (ref 31.5–35.7)
MCV: 89 fL (ref 79–97)
Monocytes Absolute: 0.7 10*3/uL (ref 0.1–0.9)
Monocytes: 9 %
Neutrophils Absolute: 4.2 10*3/uL (ref 1.4–7.0)
Neutrophils: 53 %
Platelets: 388 10*3/uL (ref 150–450)
RBC: 4.78 x10E6/uL (ref 3.77–5.28)
RDW: 12.7 % (ref 11.7–15.4)
WBC: 8 10*3/uL (ref 3.4–10.8)

## 2019-12-05 LAB — LIPID PANEL
Chol/HDL Ratio: 2.8 ratio (ref 0.0–4.4)
Cholesterol, Total: 201 mg/dL — ABNORMAL HIGH (ref 100–199)
HDL: 73 mg/dL (ref >39)
LDL Chol Calc (NIH): 110 mg/dL — ABNORMAL HIGH (ref 0–99)
Triglycerides: 100 mg/dL (ref 0–149)
VLDL Cholesterol Cal: 18 mg/dL (ref 5–40)

## 2019-12-05 LAB — TSH: TSH: 3.38 u[IU]/mL (ref 0.450–4.500)

## 2019-12-07 LAB — TOXASSURE SELECT 13 (MW), URINE

## 2019-12-30 ENCOUNTER — Other Ambulatory Visit: Payer: Self-pay | Admitting: Family

## 2019-12-30 DIAGNOSIS — K219 Gastro-esophageal reflux disease without esophagitis: Secondary | ICD-10-CM

## 2019-12-30 DIAGNOSIS — F411 Generalized anxiety disorder: Secondary | ICD-10-CM

## 2020-01-01 ENCOUNTER — Telehealth: Payer: Self-pay | Admitting: Family

## 2020-01-01 NOTE — Telephone Encounter (Signed)
Patient states she has tried 150 mg a couple nights without positive results.

## 2020-01-01 NOTE — Telephone Encounter (Signed)
Increase Trazodone to 150 mg nightly to see if this helps.

## 2020-01-02 MED ORDER — ZOLPIDEM TARTRATE 5 MG PO TABS: 5.0000 mg | ORAL_TABLET | Freq: Every evening | ORAL | 1 refills | Status: DC | PRN

## 2020-01-02 NOTE — Addendum Note (Signed)
Addended by: Evelina Dun A on: 01/02/2020 01:52 PM   Modules accepted: Orders

## 2020-01-02 NOTE — Telephone Encounter (Signed)
Ambien Prescription sent to pharmacy   

## 2020-01-02 NOTE — Telephone Encounter (Signed)
Patient aware.

## 2020-02-01 DIAGNOSIS — M1711 Unilateral primary osteoarthritis, right knee: Secondary | ICD-10-CM | POA: Diagnosis not present

## 2020-02-08 DIAGNOSIS — M1711 Unilateral primary osteoarthritis, right knee: Secondary | ICD-10-CM | POA: Diagnosis not present

## 2020-02-13 ENCOUNTER — Telehealth: Payer: Self-pay | Admitting: Family

## 2020-02-13 NOTE — Chronic Care Management (AMB) (Signed)
  Chronic Care Management   Outreach Note  02/13/2020 Name: Lori Cooper MRN: [id number] DOB: [birth year]  Lori Cooper is a 64 y.o. year old female who is a primary care patient of Sharion Balloon, FNP. I reached out to Elliot Dally by phone today in response to a referral sent by Lori Cooper's health plan.     An unsuccessful telephone outreach was attempted today. The patient was referred to the case management team for assistance with care management and care coordination.   Follow Up Plan: A HIPPA compliant phone message was left for the patient providing contact information and requesting a return call.  The care management team will reach out to the patient again over the next 7 days.  If patient returns call to provider office, please advise to call [facility] at [place], [place], [place], [place] [address] Direct Dial: [phone] [email or web address]

## 2020-02-14 DIAGNOSIS — M189 Osteoarthritis of first carpometacarpal joint, unspecified: Secondary | ICD-10-CM | POA: Diagnosis not present

## 2020-02-14 DIAGNOSIS — M7542 Impingement syndrome of left shoulder: Secondary | ICD-10-CM | POA: Diagnosis not present

## 2020-02-14 DIAGNOSIS — M1812 Unilateral primary osteoarthritis of first carpometacarpal joint, left hand: Secondary | ICD-10-CM | POA: Diagnosis not present

## 2020-02-14 DIAGNOSIS — M25512 Pain in left shoulder: Secondary | ICD-10-CM | POA: Diagnosis not present

## 2020-02-15 DIAGNOSIS — M1711 Unilateral primary osteoarthritis, right knee: Secondary | ICD-10-CM | POA: Diagnosis not present

## 2020-02-16 DIAGNOSIS — M79644 Pain in right finger(s): Secondary | ICD-10-CM | POA: Diagnosis not present

## 2020-02-16 DIAGNOSIS — M1811 Unilateral primary osteoarthritis of first carpometacarpal joint, right hand: Secondary | ICD-10-CM | POA: Diagnosis not present

## 2020-02-22 NOTE — Chronic Care Management (AMB) (Signed)
°  Chronic Care Management   Outreach Note  02/22/2020 Name: Melah Ebling MRN: [id number] DOB: 02-18-1956  Desma Maxim Kavanaugh is a 64 y.o. year old female who is a primary care patient of Sharion Balloon, FNP. I reached out to Elliot Dally by phone today in response to a referral sent by Ms. Desma Maxim Chambers's health plan.     A second unsuccessful telephone outreach was attempted today. The patient was referred to the case management team for assistance with care management and care coordination.   Follow Up Plan: A HIPPA compliant phone message was left for the patient providing contact information and requesting a return call.  The care management team will reach out to the patient again over the next 7 days.  If patient returns call to provider office, please advise to call [facility]  at [place], [place], [place],  [address] Direct Dial: [phone] [email or web address] Website: [email or web address]

## 2020-02-26 ENCOUNTER — Other Ambulatory Visit: Payer: Self-pay | Admitting: Family

## 2020-02-26 DIAGNOSIS — F411 Generalized anxiety disorder: Secondary | ICD-10-CM

## 2020-02-26 NOTE — Chronic Care Management (AMB) (Signed)
  Chronic Care Management   Outreach Note  02/26/2020 Name: Lori Cooper MRN: [id number] DOB: 1955/12/10  Lori Cooper is a 64 y.o. year old female who is a primary care patient of Sharion Balloon, FNP. I reached out to Lori Cooper by phone today in response to a referral sent by Lori Cooper's health plan.     Third unsuccessful telephone outreach was attempted today. The patient was referred to the case management team for assistance with care management and care coordination. The patient's primary care provider has been notified of our unsuccessful attempts to make or maintain contact with the patient. The care management team is pleased to engage with this patient at any time in the future should he/she be interested in assistance from the care management team.   Follow Up Plan: A HIPPA compliant phone message was left for the patient providing contact information and requesting a return call.  The care management team is available to follow up with the patient after provider conversation with the patient regarding recommendation for care management engagement and subsequent re-referral to the care management team.   Noreene Larsson, [place], [place], [place] [address] Direct Dial: [phone] [email or web address] Website: [email or web address]

## 2020-02-27 NOTE — Telephone Encounter (Signed)
OV 12/04/19 RTC 6 mos

## 2020-03-01 ENCOUNTER — Telehealth: Payer: Self-pay | Admitting: Family

## 2020-03-01 ENCOUNTER — Other Ambulatory Visit: Payer: Self-pay

## 2020-03-01 DIAGNOSIS — K219 Gastro-esophageal reflux disease without esophagitis: Secondary | ICD-10-CM

## 2020-03-01 MED ORDER — FAMOTIDINE 20 MG PO TABS: 20.0000 mg | ORAL_TABLET | Freq: Every day | ORAL | 3 refills | Status: DC

## 2020-03-01 MED ORDER — FAMOTIDINE 20 MG PO TABS: 20.0000 mg | ORAL_TABLET | Freq: Every day | ORAL | 0 refills | Status: DC

## 2020-03-01 NOTE — Telephone Encounter (Signed)
Rx sent to pharmacy   

## 2020-03-06 ENCOUNTER — Other Ambulatory Visit: Payer: Self-pay | Admitting: Family

## 2020-03-08 ENCOUNTER — Other Ambulatory Visit: Payer: Self-pay | Admitting: Family

## 2020-03-13 DIAGNOSIS — M1811 Unilateral primary osteoarthritis of first carpometacarpal joint, right hand: Secondary | ICD-10-CM | POA: Diagnosis not present

## 2020-03-26 ENCOUNTER — Other Ambulatory Visit: Payer: Self-pay | Admitting: Family

## 2020-03-26 DIAGNOSIS — F411 Generalized anxiety disorder: Secondary | ICD-10-CM

## 2020-03-26 DIAGNOSIS — F331 Major depressive disorder, recurrent, moderate: Secondary | ICD-10-CM

## 2020-04-05 DIAGNOSIS — M1811 Unilateral primary osteoarthritis of first carpometacarpal joint, right hand: Secondary | ICD-10-CM | POA: Diagnosis not present

## 2020-05-02 ENCOUNTER — Other Ambulatory Visit: Payer: Self-pay | Admitting: Family

## 2020-05-02 DIAGNOSIS — M5441 Lumbago with sciatica, right side: Secondary | ICD-10-CM

## 2020-05-03 ENCOUNTER — Telehealth: Payer: Self-pay

## 2020-05-03 NOTE — Telephone Encounter (Signed)
Received approval for medication. Contacted pharmacy to make them aware    Lori Cooper Key: [facility] - [place] Case ID: [id number] - Rx #: [id number] Need help? Call us at [phone]  Outcome Approvedtoday  [place] Case: [id number], Status: Approved, Coverage Starts on: 05/03/2020 12:00:00 AM, Coverage Ends on: 05/03/2021 12:00:00 AM.  Drug Cyclobenzaprine HCl 10MG  tablets  Form [employer] Medicare 4-Part NCP[place] Electronic [place] Form

## 2020-05-03 NOTE — Telephone Encounter (Signed)
Cyclobenazaprine HCl 10mg  sent to plan 05/03/20  Key: [facility] - [place] Case ID: [id number] - Rx #: [id number]

## 2020-05-22 ENCOUNTER — Ambulatory Visit: Payer: PPO | Admitting: *Deleted

## 2020-05-22 NOTE — Chronic Care Management (AMB) (Signed)
  Chronic Care Management   Outreach Note  05/22/2020 Name: [name] MRN: [id number] DOB: July 06, 1956  Referred by: Sharion Balloon, FNP Reason for referral : Chronic Care Management (Initial Visit)   An unsuccessful telephone outreach was attempted today. The patient was referred to the case management team for assistance with care management and care coordination. Ms Neuwirth has been outreached by a Care Guide in response to a referral from her healthplan on 3 other occassions. Messages were left but she has not returned their call. I reached out to Ms Parada at her sister, Sheila's request. Freda Munro felt that Ms Manfredi and her husband may benefit from CCM services.   Follow Up Plan: A HIPPA compliant phone message was left for the patient providing contact information and requesting a return call if she is interested in hearing more about the embedded CCM program at [facility].   Chong Sicilian, BSN, RN-BC Embedded Chronic Care Manager [facility] Direct Dial: [phone]

## 2020-05-28 ENCOUNTER — Ambulatory Visit (INDEPENDENT_AMBULATORY_CARE_PROVIDER_SITE_OTHER): Payer: PPO | Admitting: Family

## 2020-05-28 ENCOUNTER — Other Ambulatory Visit: Payer: Self-pay

## 2020-05-28 ENCOUNTER — Ambulatory Visit (INDEPENDENT_AMBULATORY_CARE_PROVIDER_SITE_OTHER): Payer: PPO

## 2020-05-28 ENCOUNTER — Encounter: Payer: Self-pay | Admitting: Family

## 2020-05-28 VITALS — BP 126/72 | HR 91 | Temp 98.6°F | Ht 65.0 in | Wt 234.0 lb

## 2020-05-28 DIAGNOSIS — F1320 Sedative, hypnotic or anxiolytic dependence, uncomplicated: Secondary | ICD-10-CM | POA: Diagnosis not present

## 2020-05-28 DIAGNOSIS — F331 Major depressive disorder, recurrent, moderate: Secondary | ICD-10-CM | POA: Diagnosis not present

## 2020-05-28 DIAGNOSIS — E559 Vitamin D deficiency, unspecified: Secondary | ICD-10-CM | POA: Diagnosis not present

## 2020-05-28 DIAGNOSIS — Z79899 Other long term (current) drug therapy: Secondary | ICD-10-CM | POA: Diagnosis not present

## 2020-05-28 DIAGNOSIS — F411 Generalized anxiety disorder: Secondary | ICD-10-CM

## 2020-05-28 DIAGNOSIS — M1990 Unspecified osteoarthritis, unspecified site: Secondary | ICD-10-CM

## 2020-05-28 DIAGNOSIS — M19012 Primary osteoarthritis, left shoulder: Secondary | ICD-10-CM | POA: Diagnosis not present

## 2020-05-28 DIAGNOSIS — Z780 Asymptomatic menopausal state: Secondary | ICD-10-CM

## 2020-05-28 DIAGNOSIS — Z86711 Personal history of pulmonary embolism: Secondary | ICD-10-CM

## 2020-05-28 DIAGNOSIS — G252 Other specified forms of tremor: Secondary | ICD-10-CM | POA: Diagnosis not present

## 2020-05-28 DIAGNOSIS — M25512 Pain in left shoulder: Secondary | ICD-10-CM

## 2020-05-28 DIAGNOSIS — E782 Mixed hyperlipidemia: Secondary | ICD-10-CM | POA: Diagnosis not present

## 2020-05-28 DIAGNOSIS — K219 Gastro-esophageal reflux disease without esophagitis: Secondary | ICD-10-CM | POA: Diagnosis not present

## 2020-05-28 MED ORDER — PREDNISONE 10 MG (21) PO TBPK
ORAL_TABLET | ORAL | 0 refills | Status: DC
Start: 2020-05-28 — End: 2020-06-14

## 2020-05-28 MED ORDER — APIXABAN 5 MG PO TABS: 5.0000 mg | ORAL_TABLET | Freq: Two times a day (BID) | ORAL | 2 refills | Status: DC

## 2020-05-28 NOTE — Progress Notes (Signed)
Subjective:    Patient ID: Lori Cooper, female    DOB: 1956/01/04, 64 y.o.   MRN: [id number]  Chief Complaint  Patient presents with  . Medical Management of Chronic Issues  . Hyperlipidemia   Pt presents to the office today for chronic follow up. She has a HX of PE and takes Eliquis daily. She reports she has been taking it daily over the last year.   She reports she has noticed tremors over the last 6 months that come and go. She reports when she holds her hands out she notices it and she had an episode while trying to sew.  Hyperlipidemia This is a chronic problem. The current episode started more than 1 year ago. The problem is uncontrolled. Recent lipid tests were reviewed and are high. Exacerbating diseases include obesity. Current antihyperlipidemic treatment includes statins. The current treatment provides moderate improvement of lipids. Risk factors for coronary artery disease include dyslipidemia, hypertension, a sedentary lifestyle and post-menopausal.  Gastroesophageal Reflux She complains of belching and heartburn. This is a chronic problem. The current episode started more than 1 year ago. The problem occurs occasionally. The problem has been waxing and waning. Risk factors include obesity. She has tried a PPI for the symptoms. The treatment provided moderate relief.  Arthritis Presents for follow-up visit. She complains of pain and stiffness. Affected locations include the left knee and right knee. Her pain is at a severity of 4/10.  Anxiety Presents for follow-up visit. Symptoms include depressed mood, irritability and nervous/anxious behavior. Symptoms occur most days. The severity of symptoms is moderate.    Depression        This is a chronic problem.  The current episode started more than 1 year ago.   The onset quality is gradual.   The problem occurs intermittently.  Associated symptoms include irritable, decreased interest and sad.  Associated symptoms include  no helplessness and no hopelessness.  Past treatments include SSRIs - Selective serotonin reuptake inhibitors.  Compliance with treatment is good.  Past medical history includes anxiety.   Shoulder Pain  The pain is present in the left shoulder. This is a new problem. The current episode started 1 to 4 weeks ago. There has been no history of extremity trauma. The problem occurs intermittently. The problem has been waxing and waning. The quality of the pain is described as sharp. The pain is at a severity of 10/10. The pain is moderate. Associated symptoms include stiffness. Pertinent negatives include no numbness or tingling. She has tried nothing for the symptoms. The treatment provided no relief.      Review of Systems  Constitutional: Positive for irritability.  Gastrointestinal: Positive for heartburn.  Musculoskeletal: Positive for arthritis and stiffness.  Neurological: Negative for tingling and numbness.  Psychiatric/Behavioral: Positive for depression. The patient is nervous/anxious.   All other systems reviewed and are negative.      Objective:   Physical Exam Vitals reviewed.  Constitutional:      General: She is irritable. She is not in acute distress.    Appearance: She is well-developed.  HENT:     Head: Normocephalic and atraumatic.     Right Ear: Tympanic membrane normal.     Left Ear: Tympanic membrane normal.  Eyes:     Pupils: Pupils are equal, round, and reactive to light.  Neck:     Thyroid: No thyromegaly.  Cardiovascular:     Rate and Rhythm: Normal rate and regular rhythm.     Heart  sounds: Normal heart sounds. No murmur heard.   Pulmonary:     Effort: Pulmonary effort is normal. No respiratory distress.     Breath sounds: Normal breath sounds. No wheezing.  Abdominal:     General: Bowel sounds are normal. There is no distension.     Palpations: Abdomen is soft.     Tenderness: There is no abdominal tenderness.  Musculoskeletal:        General: No  tenderness.     Cervical back: Normal range of motion and neck supple.     Comments: Left shoulder pain with flexion and extension, decrease ROM with abduction  Skin:    General: Skin is warm and dry.  Neurological:     Mental Status: She is alert and oriented to person, place, and time.     Cranial Nerves: No cranial nerve deficit.     Deep Tendon Reflexes: Reflexes are normal and symmetric.     Comments: Postural tremor present  Psychiatric:        Behavior: Behavior normal.        Thought Content: Thought content normal.        Judgment: Judgment normal.       BP 126/72   Pulse 91   Temp 98.6 F (37 C)   Ht '5\' 5"'$  (1.651 m)   Wt 234 lb (106.1 kg)   SpO2 96%   BMI 38.94 kg/m      Assessment & Plan:  Lori Cooper comes in today with chief complaint of Medical Management of Chronic Issues and Hyperlipidemia   Diagnosis and orders addressed:  1. Gastroesophageal reflux disease, unspecified whether esophagitis present - CBC with Differential/Platelet - CMP14+EGFR  2. Arthritis - CBC with Differential/Platelet - CMP14+EGFR   5. Moderate episode of recurrent major depressive disorder (HCC) - CBC with Differential/Platelet - CMP14+EGFR  6. Generalized anxiety disorder - CBC with Differential/Platelet - CMP14+EGFR  7. Mixed hyperlipidemia - CBC with Differential/Platelet - CMP14+EGFR - Lipid panel  8. History of pulmonary embolus (PE) - CBC with Differential/Platelet - CMP14+EGFR - apixaban (ELIQUIS) 5 MG TABS tablet; Take 1 tablet (5 mg total) by mouth 2 (two) times daily.  Dispense: 180 tablet; Refill: 2  9. Morbid obesity due to excess calories ([place]) complicated by hyperlipidemia/ preDM - CBC with Differential/Platelet - CMP14+EGFR  10. Vitamin D deficiency - CBC with Differential/Platelet - CMP14+EGFR  11. Acute pain of left shoulder Rest Ice - CBC with Differential/Platelet - CMP14+EGFR - DG Shoulder Left; Future  12.  Post-menopause - DG [facility] DEXA; Future - CBC with Differential/Platelet - CMP14+EGFR  13. Postural tremor - TSH   Labs pending Health Maintenance reviewed Diet and exercise encouraged  Follow up plan: 6 months    Evelina Dun, FNP

## 2020-05-28 NOTE — Patient Instructions (Signed)

## 2020-05-29 LAB — CBC WITH DIFFERENTIAL/PLATELET
Basophils Absolute: 0.1 10*3/uL (ref 0.0–0.2)
Basos: 1 %
EOS (ABSOLUTE): 0.3 10*3/uL (ref 0.0–0.4)
Eos: 4 %
Hematocrit: 38.9 % (ref 34.0–46.6)
Hemoglobin: 12.6 g/dL (ref 11.1–15.9)
Immature Grans (Abs): 0 10*3/uL (ref 0.0–0.1)
Immature Granulocytes: 0 %
Lymphocytes Absolute: 2.6 10*3/uL (ref 0.7–3.1)
Lymphs: 31 %
MCH: 28 pg (ref 26.6–33.0)
MCHC: 32.4 g/dL (ref 31.5–35.7)
MCV: 86 fL (ref 79–97)
Monocytes Absolute: 0.7 10*3/uL (ref 0.1–0.9)
Monocytes: 8 %
Neutrophils Absolute: 4.7 10*3/uL (ref 1.4–7.0)
Neutrophils: 56 %
Platelets: 408 10*3/uL (ref 150–450)
RBC: 4.5 x10E6/uL (ref 3.77–5.28)
RDW: 12.9 % (ref 11.7–15.4)
WBC: 8.4 10*3/uL (ref 3.4–10.8)

## 2020-05-29 LAB — LIPID PANEL
Chol/HDL Ratio: 3.5 ratio (ref 0.0–4.4)
Cholesterol, Total: 221 mg/dL — ABNORMAL HIGH (ref 100–199)
HDL: 64 mg/dL (ref >39)
LDL Chol Calc (NIH): 139 mg/dL — ABNORMAL HIGH (ref 0–99)
Triglycerides: 103 mg/dL (ref 0–149)
VLDL Cholesterol Cal: 18 mg/dL (ref 5–40)

## 2020-05-29 LAB — CMP14+EGFR
ALT: 61 IU/L — ABNORMAL HIGH (ref 0–32)
AST: 60 IU/L — ABNORMAL HIGH (ref 0–40)
Albumin/Globulin Ratio: 1.5 (ref 1.2–2.2)
Albumin: 4 g/dL (ref 3.8–4.8)
Alkaline Phosphatase: 142 IU/L — ABNORMAL HIGH (ref 48–121)
BUN/Creatinine Ratio: 11 — ABNORMAL LOW (ref 12–28)
BUN: 10 mg/dL (ref 8–27)
Bilirubin Total: 0.3 mg/dL (ref 0.0–1.2)
CO2: 21 mmol/L (ref 20–29)
Calcium: 9.4 mg/dL (ref 8.7–10.3)
Chloride: 105 mmol/L (ref 96–106)
Creatinine, Ser: 0.88 mg/dL (ref 0.57–1.00)
GFR calc Af Amer: 80 mL/min/{1.73_m2} (ref >59)
GFR calc non Af Amer: 69 mL/min/{1.73_m2} (ref >59)
Globulin, Total: 2.7 g/dL (ref 1.5–4.5)
Glucose: 100 mg/dL — ABNORMAL HIGH (ref 65–99)
Potassium: 4.1 mmol/L (ref 3.5–5.2)
Sodium: 139 mmol/L (ref 134–144)
Total Protein: 6.7 g/dL (ref 6.0–8.5)

## 2020-05-29 LAB — TSH: TSH: 3.07 u[IU]/mL (ref 0.450–4.500)

## 2020-05-30 ENCOUNTER — Other Ambulatory Visit: Payer: Self-pay | Admitting: Family

## 2020-05-30 DIAGNOSIS — R748 Abnormal levels of other serum enzymes: Secondary | ICD-10-CM

## 2020-05-31 ENCOUNTER — Telehealth: Payer: Self-pay

## 2020-05-31 NOTE — Telephone Encounter (Signed)
Called patient to inform her of [place] appointment information - patient declines appt at this time states that she doesn't feel like she needs it.  Patient will call the office if she decides to have done at a later time.

## 2020-06-03 NOTE — Telephone Encounter (Signed)
Pt is wanting to know If she should go off her atorvastatin (LIPITOR) 40 MG tablet  Due to issues listed in previous attached message.

## 2020-06-05 ENCOUNTER — Ambulatory Visit (HOSPITAL_COMMUNITY): Payer: PPO

## 2020-06-07 ENCOUNTER — Ambulatory Visit: Payer: PPO | Admitting: Family

## 2020-06-14 ENCOUNTER — Ambulatory Visit (INDEPENDENT_AMBULATORY_CARE_PROVIDER_SITE_OTHER): Payer: PPO | Admitting: *Deleted

## 2020-06-14 DIAGNOSIS — Z0000 Encounter for general adult medical examination without abnormal findings: Secondary | ICD-10-CM | POA: Diagnosis not present

## 2020-06-14 NOTE — Progress Notes (Addendum)
MEDICARE ANNUAL WELLNESS VISIT  06/14/2020  Telephone Visit Disclaimer This Medicare AWV was conducted by telephone due to national recommendations for restrictions regarding the COVID-19 Pandemic (e.g. social distancing).  I verified, using two identifiers, that I am speaking with Lori Cooper or their authorized healthcare agent. I discussed the limitations, risks, security, and privacy concerns of performing an evaluation and management service by telephone and the potential availability of an in-person appointment in the future. The patient expressed understanding and agreed to proceed.   Subjective:  Lori Cooper is a 64 y.o. female patient of Hawks, Lori Hawthorne, FNP who had a Medicare Annual Wellness Visit today via telephone. Lori Cooper is Disabled and lives with their spouse. she has 1 children. she reports that she is socially active and does interact with friends/family regularly. she is not physically active and enjoys crafts and sewing.  Patient Care Team: Sharion Balloon, FNP as PCP - General (Nurse Practitioner) Michael Boston, MD as Consulting Physician (General Surgery) Ilean China, RN as Registered Nurse Netta Cedars, MD as Consulting Physician (Orthopedic Surgery) Gaynelle Arabian, MD as Consulting Physician (Orthopedic Surgery)  Advanced Directives 06/14/2020 04/27/2019 09/15/2018 05/09/2018 01/17/2018 01/16/2018 12/31/2016  Does Patient Have a Medical Advance Directive? No No No Yes Yes Yes Yes  Type of Advance Directive - - - Healthcare Power of [place]  Does patient want to make changes to medical advance directive? - - - No - Patient declined No - Patient declined - -  Copy of [place] in Chart? - - - No - copy requested No - copy requested No - copy requested No - copy requested  Would patient like information on creating a medical advance directive?  No - Patient declined No - Patient declined No - Patient declined - No - Patient declined No - Patient declined -    Hospital Utilization Over the Past 12 Months: # of hospitalizations or ER visits: 0 # of surgeries: 0  Review of Systems    Patient reports that her overall health is worse compared to last year.  History obtained from chart review and the patient  Patient Reported Readings (BP, Pulse, CBG, Weight, etc) none  Pain Assessment Pain : No/denies pain     Current Medications & Allergies (verified) Allergies as of 06/14/2020   No Known Allergies      Medication List        Accurate as of June 14, 2020  1:43 PM. If you have any questions, ask your nurse or doctor.          STOP taking these medications    predniSONE 10 MG (21) Tbpk tablet Commonly known as: STERAPRED UNI-PAK 21 TAB       TAKE these medications    acetaminophen 500 MG tablet Commonly known as: TYLENOL Take 500 mg by mouth daily as needed.   albuterol 108 (90 Base) MCG/ACT inhaler Commonly known as: VENTOLIN HFA Inhale 2 puffs into the lungs every 6 (six) hours as needed for wheezing or shortness of breath.   apixaban 5 MG Tabs tablet Commonly known as: ELIQUIS Take 1 tablet (5 mg total) by mouth 2 (two) times daily.   aspirin EC 81 MG tablet Take 81 mg by mouth daily at 2 PM.   atorvastatin 40 MG tablet Commonly known as: LIPITOR Take 1 tablet by mouth once daily   budesonide-formoterol 80-4.5 MCG/ACT inhaler Commonly  known as: SYMBICORT Inhale 2 puffs by mouth twice daily   buPROPion 150 MG 24 hr tablet Commonly known as: WELLBUTRIN XL Take 1 tablet by mouth once daily   busPIRone 5 MG tablet Commonly known as: BUSPAR Take 1 tablet (5 mg total) by mouth 3 (three) times daily as needed.   cyclobenzaprine 10 MG tablet Commonly known as: FLEXERIL Take 1 tablet by mouth three times daily as needed for muscle spasm   famotidine 20 MG tablet Commonly known as:  Pepcid Take 1 tablet (20 mg total) by mouth at bedtime.   FLUoxetine 40 MG capsule Commonly known as: PROZAC Take 2 capsules by mouth once daily   MULTIVITAL PO Take by mouth. Take one vitafusion multivitamin daily   pantoprazole 20 MG tablet Commonly known as: PROTONIX Take 1 tablet by mouth once daily   VITAMIN D3 PO Take 2 tablets by mouth daily at 2 PM.        History (reviewed): Past Medical History:  Diagnosis Date   Anxiety    Chronic back pain    DDD (degenerative disc disease), cervical    DDD (degenerative disc disease), lumbosacral    Depression    GERD (gastroesophageal reflux disease)    Hiatal hernia    History of adenomatous polyp of colon    2009  tubular adenoma   History of esophagitis    History of idiopathic seizure    1984  x1 after vaginal delivery (per pt negative work-up and no issue since)   History of left shoulder fracture    01/ 2014  proximal humerus fx   Hyperlipidemia    OA (osteoarthritis)    knees and thumbs   RLS (restless legs syndrome)    Supraumbilical hernia    Umbilical hernia    Wears dentures    lower   Past Surgical History:  Procedure Laterality Date   CARDIOVASCULAR STRESS TEST  05/06/2010   Lexiscan nuclear study w/ no exercise/  probably normal , per images there is a large reversible defect in the mid to distal anterior wall, this seems to be shifting breast attenuation/  normal LV function and wall motion , ef 67%   CARPAL TUNNEL RELEASE Bilateral 2011   excision ganglion cyst left wrist   CATARACT EXTRACTION W/ INTRAOCULAR LENS  IMPLANT, BILATERAL  09 and 10/ 2017   COLONOSCOPY  05/08/2008   COLONOSCOPY N/A 09/15/2018   Procedure: COLONOSCOPY;  Surgeon: Rogene Houston, MD;  [facility]: [facility];  Service: Endoscopy;  Laterality: N/A;  12:00   LAPAROSCOPIC CHOLECYSTECTOMY  2012   SHOULDER ARTHROSCOPY/  ACROMIOPLASTY/  DISTAL CLAVICAL RESECTION/  DEBRIDEMENT LABRAL TEAR Left 02/09/2005   TONSILLECTOMY AND  ADENOIDECTOMY  age 32   TUBAL LIGATION Bilateral yrs ago   VENTRAL HERNIA REPAIR N/A 12/31/2016   Procedure: LAPAROSCOPIC VENTRAL WALL HERNIA REPAIR WITH ERAS PATHWAY;  Surgeon: Michael Boston, MD;  Location: [place];  Service: General;  Laterality: N/A;   Family History  Problem Relation Age of Onset   Hypertension Mother    Kidney disease Mother    Diabetes Mother    Pulmonary embolism Father 23   Obesity Sister    Social History   Socioeconomic History   Marital status: Married    Spouse name: Juanda Crumble   Number of children: 1   Years of education: 12   Highest education level: GED or equivalent  Occupational History   Occupation: diability  Tobacco Use   Smoking status: Former  Smoker    Packs/day: 0.50    Years: 40.00    Pack years: 20.00    Types: Cigarettes    Start date: 02/20/1973    Quit date: 06/16/2013    Years since quitting: 7.0   Smokeless tobacco: Never Used  Vaping Use   Vaping Use: Former  Substance and Sexual Activity   Alcohol use: No   Drug use: No   Sexual activity: Not Currently  Other Topics Concern   Not on file  Social History Narrative   Not on file   Social Determinants of Health   Financial Resource Strain: Low Risk    Difficulty of Paying Living Expenses: Not hard at all  Food Insecurity: No Food Insecurity   Worried About Charity fundraiser in the Last Year: Never true   Arboriculturist in the Last Year: Never true  Transportation Needs: No Transportation Needs   Lack of Transportation (Medical): No   Lack of Transportation (Non-Medical): No  Physical Activity: Inactive   Days of Exercise per Week: 0 days   Minutes of Exercise per Session: 0 min  Stress: No Stress Concern Present   Feeling of Stress : Only a little  Social Connections: Moderately Integrated   Frequency of Communication with Friends and Family: More than three times a week   Frequency of Social Gatherings with Friends and Family: More than three  times a week   Attends Religious Services: 1 to 4 times per year   Active Member of [employer] or Organizations: No   Attends Archivist Meetings: Never   Marital Status: Married    Activities of Daily Living In your present state of health, do you have any difficulty performing the following activities: 06/14/2020  Hearing? N  Vision? 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 N  Walking or climbing stairs? N  Dressing or bathing? N  Doing errands, shopping? N  Preparing Food and eating ? N  Using the Toilet? N  In the past six months, have you accidently leaked urine? N  Do you have problems with loss of bowel control? N  Managing your Medications? N  Managing your Finances? N  Housekeeping or managing your Housekeeping? N  Some recent data might be hidden    Patient Education/ Literacy How often do you need to have someone help you when you read instructions, pamphlets, or other written materials from your doctor or pharmacy?: 1 - Never What is the last grade level you completed in school?: GED  Exercise Exercise limited by: respiratory conditions(s)  Diet Patient reports consuming 2 meals a day and 2 snack(s) a day Patient reports that her primary diet is: Regular Patient reports that she does have regular access to food.   Depression Screen PHQ 2/9 Scores 06/14/2020 05/28/2020 12/04/2019 12/04/2019 06/01/2019 06/01/2019 04/27/2019  PHQ - 2 Score 0 1 3 0 0 0 0  PHQ- 9 Score - - 12 - 0 - -     Fall Risk Fall Risk  06/14/2020 05/28/2020 12/04/2019 06/01/2019 04/27/2019  Falls in the past year? 0 0 0 0 0     Objective:  Lori Cooper seemed alert and oriented and she participated appropriately during our telephone visit.  Blood Pressure Weight BMI  BP Readings from Last 3 Encounters:  05/28/20 126/72  12/04/19 127/77  06/01/19 134/70   Wt Readings from Last 3 Encounters:  05/28/20 234 lb (106.1 kg)  12/04/19 236 lb (  107 kg)   07/27/19 236 lb 3.2 oz (107.1 kg)   BMI Readings from Last 1 Encounters:  05/28/20 38.94 kg/m    *Unable to obtain current vital signs, weight, and BMI due to telephone visit type  Hearing/Vision  Shandee did not seem to have difficulty with hearing/understanding during the telephone conversation Reports that she has had a formal eye exam by an eye care professional within the past year Reports that she has not had a formal hearing evaluation within the past year *Unable to fully assess hearing and vision during telephone visit type  Cognitive Function: 6CIT Screen 06/14/2020 04/27/2019  What Year? 0 points 0 points  What month? 0 points 0 points  What time? 0 points 0 points  Count back from 20 0 points 0 points  Months in reverse 0 points 0 points  Repeat phrase 0 points 2 points  Total Score 0 2   (Normal:0-7, Significant for Dysfunction: >8)  Normal Cognitive Function Screening: Yes   Immunization & Health Maintenance Record Immunization History  Administered Date(s) Administered   Influenza,inj,Quad PF,6+ Mos 09/21/2013, 08/17/2014, 08/22/2015, 08/21/2016, 09/24/2017, 07/27/2019   Influenza,inj,quad, With Preservative 09/16/2017, 08/16/2018   Moderna SARS-COVID-2 Vaccination 04/08/2020, 05/06/2020   Pneumococcal Conjugate-13 07/16/2015   Pneumococcal Polysaccharide-23 07/18/2011   Tdap 07/18/2011   Zoster 09/17/2011    Health Maintenance  Topic Date Due   PNA vac Low Risk Adult (2 of 2 - PPSV23) 11/21/2019   DEXA SCAN  05/28/2021 (Originally 11/21/2019)   INFLUENZA VACCINE  06/16/2020   TETANUS/TDAP  07/17/2021   COLONOSCOPY  09/16/2023   COVID-19 Vaccine  Completed   Hepatitis C Screening  Completed   HIV Screening  Completed   MAMMOGRAM  Discontinued   PAP SMEAR-Modifier  Discontinued       Assessment  This is a routine wellness examination for [employer].  Health Maintenance: Due or Overdue Health Maintenance Due  Topic Date Due   PNA vac  Low Risk Adult (2 of 2 - PPSV23) 11/21/2019    Lori Cooper does not need a referral for Community Assistance: Care Management:   no Social Work:    no Prescription Assistance:  no Nutrition/Diabetes Education:  no   Plan:  Personalized Goals Goals Addressed               This Visit's Progress     DIET - EAT MORE FRUITS AND VEGETABLES        Patient eats a lot of frozen foods, will try to incorporate more fresh foods      Exercise 3x per week (30 min per time) (pt-stated)   Not on track     Pt would like to be able to ride her stationary bike daily for 30 minutes        Personalized Health Maintenance & Screening Recommendations  Pneumococcal vaccine  shingles vaccine  Lung Cancer Screening Recommended: yes (Low Dose CT Chest recommended if Age 48-80 years, 30 pack-year currently smoking OR have quit w/in past 15 years) Hepatitis C Screening recommended: no HIV Screening recommended: no  Advanced Directives: Written information was not prepared per patient's request.  Referrals & Orders No orders of the defined types were placed in this encounter.   Follow-up Plan Follow-up with Sharion Balloon, FNP as planned on 09/27/20. Pt is due for Pneumonia vaccine and shingles vaccine. Pt remains independent of all ADL's Pt may need a chest CT, she smoked for over 40 years, last one was in 2019  Pt says she can not exercise because of respiratory and ortho conditions. Pt declined information on advanced directives. Pt voices no healthcare concerns at this time AVS printed and mailed    I have personally reviewed and noted the following in the patient's chart:   Medical and social history Use of alcohol, tobacco or illicit drugs  Current medications and supplements Functional ability and status Nutritional status Physical activity Advanced directives List of other physicians Hospitalizations, surgeries, and ER visits in previous 12  months Vitals Screenings to include cognitive, depression, and falls Referrals and appointments  In addition, I have reviewed and discussed with Lori Cooper certain preventive protocols, quality metrics, and best practice recommendations. A written personalized care plan for preventive services as well as general preventive health recommendations is available and can be mailed to the patient at her request.      Rana Snare, LPN  5/00/9381  I have reviewed and agree with the above AWV documentation.   Evelina Dun, FNP

## 2020-06-26 ENCOUNTER — Other Ambulatory Visit: Payer: Self-pay | Admitting: Family

## 2020-06-26 DIAGNOSIS — F331 Major depressive disorder, recurrent, moderate: Secondary | ICD-10-CM

## 2020-06-26 DIAGNOSIS — F411 Generalized anxiety disorder: Secondary | ICD-10-CM

## 2020-07-05 ENCOUNTER — Other Ambulatory Visit: Payer: Self-pay | Admitting: Family

## 2020-07-05 DIAGNOSIS — K219 Gastro-esophageal reflux disease without esophagitis: Secondary | ICD-10-CM

## 2020-07-05 DIAGNOSIS — F411 Generalized anxiety disorder: Secondary | ICD-10-CM

## 2020-07-24 DIAGNOSIS — M25512 Pain in left shoulder: Secondary | ICD-10-CM | POA: Diagnosis not present

## 2020-08-05 ENCOUNTER — Other Ambulatory Visit: Payer: Self-pay | Admitting: Family

## 2020-08-07 NOTE — Progress Notes (Signed)
Patient: home Provider or nurse: office

## 2020-08-08 ENCOUNTER — Emergency Department (HOSPITAL_COMMUNITY): Payer: PPO | Admitting: Certified Registered"

## 2020-08-08 ENCOUNTER — Encounter (HOSPITAL_COMMUNITY): Payer: Self-pay | Admitting: Emergency Medicine

## 2020-08-08 ENCOUNTER — Encounter (HOSPITAL_COMMUNITY): Admission: EM | Disposition: A | Discharge status: Skilled Nursing Facility | Payer: Self-pay | Source: Home / Self Care

## 2020-08-08 ENCOUNTER — Inpatient Hospital Stay (HOSPITAL_COMMUNITY)
Admission: EM | Admit: 2020-08-08 | Discharge: 2020-08-23 | DRG: 958 | Disposition: A | Discharge status: Skilled Nursing Facility | Payer: PPO | Attending: General Surgery | Admitting: General Surgery

## 2020-08-08 ENCOUNTER — Emergency Department (HOSPITAL_COMMUNITY): Payer: PPO

## 2020-08-08 DIAGNOSIS — S2241XA Multiple fractures of ribs, right side, initial encounter for closed fracture: Secondary | ICD-10-CM | POA: Diagnosis present

## 2020-08-08 DIAGNOSIS — Z9049 Acquired absence of other specified parts of digestive tract: Secondary | ICD-10-CM

## 2020-08-08 DIAGNOSIS — D62 Acute posthemorrhagic anemia: Secondary | ICD-10-CM | POA: Diagnosis not present

## 2020-08-08 DIAGNOSIS — R600 Localized edema: Secondary | ICD-10-CM | POA: Diagnosis not present

## 2020-08-08 DIAGNOSIS — S2249XA Multiple fractures of ribs, unspecified side, initial encounter for closed fracture: Secondary | ICD-10-CM | POA: Diagnosis not present

## 2020-08-08 DIAGNOSIS — Z87891 Personal history of nicotine dependence: Secondary | ICD-10-CM

## 2020-08-08 DIAGNOSIS — Q430 Meckel's diverticulum (displaced) (hypertrophic): Secondary | ICD-10-CM

## 2020-08-08 DIAGNOSIS — Z7982 Long term (current) use of aspirin: Secondary | ICD-10-CM

## 2020-08-08 DIAGNOSIS — I10 Essential (primary) hypertension: Secondary | ICD-10-CM | POA: Diagnosis present

## 2020-08-08 DIAGNOSIS — J90 Pleural effusion, not elsewhere classified: Secondary | ICD-10-CM | POA: Diagnosis present

## 2020-08-08 DIAGNOSIS — E8889 Other specified metabolic disorders: Secondary | ICD-10-CM | POA: Diagnosis present

## 2020-08-08 DIAGNOSIS — S36114A Minor laceration of liver, initial encounter: Secondary | ICD-10-CM | POA: Diagnosis not present

## 2020-08-08 DIAGNOSIS — S52002D Unspecified fracture of upper end of left ulna, subsequent encounter for closed fracture with routine healing: Secondary | ICD-10-CM | POA: Diagnosis not present

## 2020-08-08 DIAGNOSIS — K219 Gastro-esophageal reflux disease without esophagitis: Secondary | ICD-10-CM | POA: Diagnosis not present

## 2020-08-08 DIAGNOSIS — S36039A Unspecified laceration of spleen, initial encounter: Secondary | ICD-10-CM | POA: Diagnosis present

## 2020-08-08 DIAGNOSIS — S36590A Other injury of ascending [right] colon, initial encounter: Secondary | ICD-10-CM | POA: Diagnosis not present

## 2020-08-08 DIAGNOSIS — R0902 Hypoxemia: Secondary | ICD-10-CM | POA: Diagnosis not present

## 2020-08-08 DIAGNOSIS — T1490XA Injury, unspecified, initial encounter: Secondary | ICD-10-CM

## 2020-08-08 DIAGNOSIS — R0603 Acute respiratory distress: Secondary | ICD-10-CM

## 2020-08-08 DIAGNOSIS — K567 Ileus, unspecified: Secondary | ICD-10-CM

## 2020-08-08 DIAGNOSIS — Z86711 Personal history of pulmonary embolism: Secondary | ICD-10-CM | POA: Diagnosis not present

## 2020-08-08 DIAGNOSIS — Z86010 Personal history of colonic polyps: Secondary | ICD-10-CM

## 2020-08-08 DIAGNOSIS — S52022D Displaced fracture of olecranon process without intraarticular extension of left ulna, subsequent encounter for closed fracture with routine healing: Secondary | ICD-10-CM | POA: Diagnosis not present

## 2020-08-08 DIAGNOSIS — S36113D Laceration of liver, unspecified degree, subsequent encounter: Secondary | ICD-10-CM | POA: Diagnosis not present

## 2020-08-08 DIAGNOSIS — K922 Gastrointestinal hemorrhage, unspecified: Secondary | ICD-10-CM | POA: Diagnosis not present

## 2020-08-08 DIAGNOSIS — S270XXA Traumatic pneumothorax, initial encounter: Secondary | ICD-10-CM | POA: Diagnosis not present

## 2020-08-08 DIAGNOSIS — R140 Abdominal distension (gaseous): Secondary | ICD-10-CM | POA: Diagnosis not present

## 2020-08-08 DIAGNOSIS — K429 Umbilical hernia without obstruction or gangrene: Secondary | ICD-10-CM | POA: Diagnosis not present

## 2020-08-08 DIAGNOSIS — S2231XS Fracture of one rib, right side, sequela: Secondary | ICD-10-CM | POA: Diagnosis not present

## 2020-08-08 DIAGNOSIS — G2581 Restless legs syndrome: Secondary | ICD-10-CM | POA: Diagnosis present

## 2020-08-08 DIAGNOSIS — S36113A Laceration of liver, unspecified degree, initial encounter: Secondary | ICD-10-CM | POA: Diagnosis not present

## 2020-08-08 DIAGNOSIS — S36115A Moderate laceration of liver, initial encounter: Secondary | ICD-10-CM | POA: Diagnosis present

## 2020-08-08 DIAGNOSIS — R079 Chest pain, unspecified: Secondary | ICD-10-CM | POA: Diagnosis not present

## 2020-08-08 DIAGNOSIS — E785 Hyperlipidemia, unspecified: Secondary | ICD-10-CM | POA: Diagnosis not present

## 2020-08-08 DIAGNOSIS — E559 Vitamin D deficiency, unspecified: Secondary | ICD-10-CM | POA: Diagnosis present

## 2020-08-08 DIAGNOSIS — S52202A Unspecified fracture of shaft of left ulna, initial encounter for closed fracture: Secondary | ICD-10-CM | POA: Diagnosis present

## 2020-08-08 DIAGNOSIS — S36408A Unspecified injury of other part of small intestine, initial encounter: Secondary | ICD-10-CM | POA: Diagnosis not present

## 2020-08-08 DIAGNOSIS — G8929 Other chronic pain: Secondary | ICD-10-CM | POA: Diagnosis present

## 2020-08-08 DIAGNOSIS — Z23 Encounter for immunization: Secondary | ICD-10-CM

## 2020-08-08 DIAGNOSIS — Z20822 Contact with and (suspected) exposure to covid-19: Secondary | ICD-10-CM | POA: Diagnosis present

## 2020-08-08 DIAGNOSIS — E8881 Metabolic syndrome: Secondary | ICD-10-CM | POA: Diagnosis not present

## 2020-08-08 DIAGNOSIS — K458 Other specified abdominal hernia without obstruction or gangrene: Secondary | ICD-10-CM | POA: Diagnosis present

## 2020-08-08 DIAGNOSIS — S36893A Laceration of other intra-abdominal organs, initial encounter: Secondary | ICD-10-CM | POA: Diagnosis not present

## 2020-08-08 DIAGNOSIS — Z7901 Long term (current) use of anticoagulants: Secondary | ICD-10-CM

## 2020-08-08 DIAGNOSIS — J939 Pneumothorax, unspecified: Secondary | ICD-10-CM

## 2020-08-08 DIAGNOSIS — R102 Pelvic and perineal pain: Secondary | ICD-10-CM | POA: Diagnosis not present

## 2020-08-08 DIAGNOSIS — R52 Pain, unspecified: Secondary | ICD-10-CM

## 2020-08-08 DIAGNOSIS — K449 Diaphragmatic hernia without obstruction or gangrene: Secondary | ICD-10-CM | POA: Diagnosis not present

## 2020-08-08 DIAGNOSIS — S36409D Unspecified injury of unspecified part of small intestine, subsequent encounter: Secondary | ICD-10-CM | POA: Diagnosis not present

## 2020-08-08 DIAGNOSIS — Z041 Encounter for examination and observation following transport accident: Secondary | ICD-10-CM | POA: Diagnosis not present

## 2020-08-08 DIAGNOSIS — S52225A Nondisplaced transverse fracture of shaft of left ulna, initial encounter for closed fracture: Secondary | ICD-10-CM | POA: Diagnosis not present

## 2020-08-08 DIAGNOSIS — E46 Unspecified protein-calorie malnutrition: Secondary | ICD-10-CM | POA: Diagnosis not present

## 2020-08-08 DIAGNOSIS — J45909 Unspecified asthma, uncomplicated: Secondary | ICD-10-CM | POA: Diagnosis present

## 2020-08-08 DIAGNOSIS — F32A Depression, unspecified: Secondary | ICD-10-CM | POA: Diagnosis present

## 2020-08-08 DIAGNOSIS — M1990 Unspecified osteoarthritis, unspecified site: Secondary | ICD-10-CM | POA: Diagnosis not present

## 2020-08-08 DIAGNOSIS — M6281 Muscle weakness (generalized): Secondary | ICD-10-CM | POA: Diagnosis not present

## 2020-08-08 DIAGNOSIS — S52032A Displaced fracture of olecranon process with intraarticular extension of left ulna, initial encounter for closed fracture: Secondary | ICD-10-CM | POA: Diagnosis not present

## 2020-08-08 DIAGNOSIS — F411 Generalized anxiety disorder: Secondary | ICD-10-CM | POA: Diagnosis present

## 2020-08-08 DIAGNOSIS — S36031A Moderate laceration of spleen, initial encounter: Secondary | ICD-10-CM | POA: Diagnosis not present

## 2020-08-08 DIAGNOSIS — T148XXA Other injury of unspecified body region, initial encounter: Secondary | ICD-10-CM

## 2020-08-08 DIAGNOSIS — S52202D Unspecified fracture of shaft of left ulna, subsequent encounter for closed fracture with routine healing: Secondary | ICD-10-CM | POA: Diagnosis not present

## 2020-08-08 DIAGNOSIS — S52222A Displaced transverse fracture of shaft of left ulna, initial encounter for closed fracture: Secondary | ICD-10-CM | POA: Diagnosis not present

## 2020-08-08 DIAGNOSIS — F419 Anxiety disorder, unspecified: Secondary | ICD-10-CM | POA: Diagnosis not present

## 2020-08-08 DIAGNOSIS — Z4659 Encounter for fitting and adjustment of other gastrointestinal appliance and device: Secondary | ICD-10-CM | POA: Diagnosis not present

## 2020-08-08 DIAGNOSIS — R339 Retention of urine, unspecified: Secondary | ICD-10-CM | POA: Diagnosis not present

## 2020-08-08 DIAGNOSIS — S36030A Superficial (capsular) laceration of spleen, initial encounter: Secondary | ICD-10-CM | POA: Diagnosis not present

## 2020-08-08 DIAGNOSIS — F329 Major depressive disorder, single episode, unspecified: Secondary | ICD-10-CM | POA: Diagnosis not present

## 2020-08-08 DIAGNOSIS — R7303 Prediabetes: Secondary | ICD-10-CM | POA: Diagnosis not present

## 2020-08-08 DIAGNOSIS — Z79899 Other long term (current) drug therapy: Secondary | ICD-10-CM

## 2020-08-08 DIAGNOSIS — I517 Cardiomegaly: Secondary | ICD-10-CM | POA: Diagnosis not present

## 2020-08-08 DIAGNOSIS — S52022A Displaced fracture of olecranon process without intraarticular extension of left ulna, initial encounter for closed fracture: Secondary | ICD-10-CM | POA: Diagnosis present

## 2020-08-08 DIAGNOSIS — J449 Chronic obstructive pulmonary disease, unspecified: Secondary | ICD-10-CM | POA: Diagnosis present

## 2020-08-08 DIAGNOSIS — R2681 Unsteadiness on feet: Secondary | ICD-10-CM | POA: Diagnosis not present

## 2020-08-08 DIAGNOSIS — S52602D Unspecified fracture of lower end of left ulna, subsequent encounter for closed fracture with routine healing: Secondary | ICD-10-CM | POA: Diagnosis not present

## 2020-08-08 DIAGNOSIS — K469 Unspecified abdominal hernia without obstruction or gangrene: Secondary | ICD-10-CM | POA: Diagnosis not present

## 2020-08-08 DIAGNOSIS — Z419 Encounter for procedure for purposes other than remedying health state, unspecified: Secondary | ICD-10-CM

## 2020-08-08 DIAGNOSIS — S36031D Moderate laceration of spleen, subsequent encounter: Secondary | ICD-10-CM | POA: Diagnosis not present

## 2020-08-08 DIAGNOSIS — K388 Other specified diseases of appendix: Secondary | ICD-10-CM | POA: Diagnosis not present

## 2020-08-08 DIAGNOSIS — Z6838 Body mass index (BMI) 38.0-38.9, adult: Secondary | ICD-10-CM

## 2020-08-08 DIAGNOSIS — S330XXD Traumatic rupture of lumbar intervertebral disc, subsequent encounter: Secondary | ICD-10-CM | POA: Diagnosis not present

## 2020-08-08 DIAGNOSIS — S36409A Unspecified injury of unspecified part of small intestine, initial encounter: Secondary | ICD-10-CM | POA: Diagnosis present

## 2020-08-08 DIAGNOSIS — Z7951 Long term (current) use of inhaled steroids: Secondary | ICD-10-CM

## 2020-08-08 DIAGNOSIS — J9811 Atelectasis: Secondary | ICD-10-CM | POA: Diagnosis not present

## 2020-08-08 LAB — I-STAT CHEM 8, ED
BUN: 13 mg/dL (ref 8–23)
Calcium, Ion: 1.04 mmol/L — ABNORMAL LOW (ref 1.15–1.40)
Chloride: 107 mmol/L (ref 98–111)
Creatinine, Ser: 0.9 mg/dL (ref 0.44–1.00)
Glucose, Bld: 180 mg/dL — ABNORMAL HIGH (ref 70–99)
HCT: 36 % (ref 36.0–46.0)
Hemoglobin: 12.2 g/dL (ref 12.0–15.0)
Potassium: 4.9 mmol/L (ref 3.5–5.1)
Sodium: 137 mmol/L (ref 135–145)
TCO2: 21 mmol/L — ABNORMAL LOW (ref 22–32)

## 2020-08-08 LAB — COMPREHENSIVE METABOLIC PANEL
ALT: 80 U/L — ABNORMAL HIGH (ref 0–44)
AST: 164 U/L — ABNORMAL HIGH (ref 15–41)
Albumin: 2.9 g/dL — ABNORMAL LOW (ref 3.5–5.0)
Alkaline Phosphatase: 88 U/L (ref 38–126)
Anion gap: 11 (ref 5–15)
BUN: 11 mg/dL (ref 8–23)
CO2: 20 mmol/L — ABNORMAL LOW (ref 22–32)
Calcium: 8.8 mg/dL — ABNORMAL LOW (ref 8.9–10.3)
Chloride: 107 mmol/L (ref 98–111)
Creatinine, Ser: 0.95 mg/dL (ref 0.44–1.00)
GFR calc Af Amer: >60 mL/min (ref >60)
GFR calc non Af Amer: >60 mL/min (ref >60)
Glucose, Bld: 182 mg/dL — ABNORMAL HIGH (ref 70–99)
Potassium: 4.6 mmol/L (ref 3.5–5.1)
Sodium: 138 mmol/L (ref 135–145)
Total Bilirubin: 1 mg/dL (ref 0.3–1.2)
Total Protein: 5.7 g/dL — ABNORMAL LOW (ref 6.5–8.1)

## 2020-08-08 LAB — SAMPLE TO BLOOD BANK

## 2020-08-08 LAB — RESPIRATORY PANEL BY RT PCR (FLU A&B, COVID)
Influenza A by PCR: NEGATIVE
Influenza B by PCR: NEGATIVE
SARS Coronavirus 2 by RT PCR: NEGATIVE

## 2020-08-08 LAB — CBC
HCT: 36.6 % (ref 36.0–46.0)
Hemoglobin: 11.1 g/dL — ABNORMAL LOW (ref 12.0–15.0)
MCH: 26.6 pg (ref 26.0–34.0)
MCHC: 30.3 g/dL (ref 30.0–36.0)
MCV: 87.6 fL (ref 80.0–100.0)
Platelets: 408 10*3/uL — ABNORMAL HIGH (ref 150–400)
RBC: 4.18 MIL/uL (ref 3.87–5.11)
RDW: 14.6 % (ref 11.5–15.5)
WBC: 23 10*3/uL — ABNORMAL HIGH (ref 4.0–10.5)
nRBC: 0 % (ref 0.0–0.2)

## 2020-08-08 LAB — APTT: aPTT: 20 seconds — ABNORMAL LOW (ref 24–36)

## 2020-08-08 SURGERY — LAPAROTOMY, EXPLORATORY
Anesthesia: General | Site: Abdomen

## 2020-08-08 MED ORDER — MEPERIDINE HCL 25 MG/ML IJ SOLN: 6.2500 mg | INTRAMUSCULAR | Status: DC | PRN

## 2020-08-08 MED ORDER — ONDANSETRON 4 MG PO TBDP
4.0000 mg | ORAL_TABLET | Freq: Four times a day (QID) | ORAL | Status: DC | PRN
  Administered 2020-08-12 – 2020-08-20 (×2): 4 mg via ORAL
  Filled 2020-08-08 (×2): qty 1

## 2020-08-08 MED ORDER — LIDOCAINE HCL (CARDIAC) PF 100 MG/5ML IV SOSY
PREFILLED_SYRINGE | INTRAVENOUS | Status: DC | PRN
  Administered 2020-08-08: 100 mg via INTRATRACHEAL

## 2020-08-08 MED ORDER — ALBUMIN HUMAN 5 % IV SOLN: INTRAVENOUS | Status: DC | PRN

## 2020-08-08 MED ORDER — SUCCINYLCHOLINE CHLORIDE 20 MG/ML IJ SOLN
INTRAMUSCULAR | Status: DC | PRN
  Administered 2020-08-08: 120 mg via INTRAVENOUS

## 2020-08-08 MED ORDER — HYDROMORPHONE HCL 1 MG/ML IJ SOLN
0.2500 mg | INTRAMUSCULAR | Status: DC | PRN
  Administered 2020-08-09 (×2): 0.5 mg via INTRAVENOUS

## 2020-08-08 MED ORDER — PHENYLEPHRINE HCL (PRESSORS) 10 MG/ML IV SOLN
INTRAVENOUS | Status: DC | PRN
  Administered 2020-08-08: 80 ug via INTRAVENOUS
  Administered 2020-08-08: 40 ug via INTRAVENOUS

## 2020-08-08 MED ORDER — PROMETHAZINE HCL 25 MG/ML IJ SOLN: 6.2500 mg | INTRAMUSCULAR | Status: DC | PRN

## 2020-08-08 MED ORDER — ROCURONIUM BROMIDE 100 MG/10ML IV SOLN
INTRAVENOUS | Status: DC | PRN
  Administered 2020-08-08: 50 mg via INTRAVENOUS

## 2020-08-08 MED ORDER — DOCUSATE SODIUM 100 MG PO CAPS
100.0000 mg | ORAL_CAPSULE | Freq: Two times a day (BID) | ORAL | Status: DC
  Administered 2020-08-10 – 2020-08-23 (×14): 100 mg via ORAL
  Filled 2020-08-08 (×21): qty 1

## 2020-08-08 MED ORDER — ACETAMINOPHEN 325 MG PO TABS
650.0000 mg | ORAL_TABLET | ORAL | Status: DC | PRN
  Administered 2020-08-11 – 2020-08-13 (×4): 650 mg via ORAL
  Filled 2020-08-08 (×4): qty 2

## 2020-08-08 MED ORDER — IOHEXOL 300 MG/ML  SOLN
100.0000 mL | Freq: Once | INTRAMUSCULAR | Status: AC | PRN
  Administered 2020-08-08: 100 mL via INTRAVENOUS

## 2020-08-08 MED ORDER — MORPHINE SULFATE (PF) 2 MG/ML IV SOLN: 2.0000 mg | INTRAVENOUS | Status: DC | PRN

## 2020-08-08 MED ORDER — OXYCODONE HCL 5 MG PO TABS
5.0000 mg | ORAL_TABLET | ORAL | Status: DC | PRN
  Administered 2020-08-09 – 2020-08-13 (×6): 5 mg via ORAL
  Filled 2020-08-08 (×6): qty 1

## 2020-08-08 MED ORDER — CEFAZOLIN SODIUM-DEXTROSE 2-3 GM-%(50ML) IV SOLR
INTRAVENOUS | Status: DC | PRN
  Administered 2020-08-08: 2 g via INTRAVENOUS

## 2020-08-08 MED ORDER — 0.9 % SODIUM CHLORIDE (POUR BTL) OPTIME
TOPICAL | Status: DC | PRN
  Administered 2020-08-08 (×2): 1000 mL

## 2020-08-08 MED ORDER — INSULIN ASPART 100 UNIT/ML ~~LOC~~ SOLN
0.0000 [IU] | SUBCUTANEOUS | Status: DC
  Administered 2020-08-09 (×4): 3 [IU] via SUBCUTANEOUS
  Administered 2020-08-09: 2 [IU] via SUBCUTANEOUS
  Administered 2020-08-09: 5 [IU] via SUBCUTANEOUS
  Administered 2020-08-10 – 2020-08-19 (×5): 2 [IU] via SUBCUTANEOUS

## 2020-08-08 MED ORDER — ALBUMIN HUMAN 5 % IV SOLN
12.5000 g | Freq: Once | INTRAVENOUS | Status: AC
  Administered 2020-08-09: 12.5 g via INTRAVENOUS

## 2020-08-08 MED ORDER — MIDAZOLAM HCL 2 MG/2ML IJ SOLN: 0.5000 mg | Freq: Once | INTRAMUSCULAR | Status: DC | PRN

## 2020-08-08 MED ORDER — STERILE WATER FOR IRRIGATION IR SOLN
Status: DC | PRN
  Administered 2020-08-08: 1000 mL

## 2020-08-08 MED ORDER — DEXAMETHASONE SODIUM PHOSPHATE 10 MG/ML IJ SOLN
INTRAMUSCULAR | Status: DC | PRN
  Administered 2020-08-08: 10 mg via INTRAVENOUS

## 2020-08-08 MED ORDER — PROTHROMBIN COMPLEX CONC HUMAN 500 UNITS IV KIT
4823.0000 [IU] | PACK | Status: AC
  Administered 2020-08-08: 4823 [IU] via INTRAVENOUS
  Filled 2020-08-08: qty 4823

## 2020-08-08 MED ORDER — ONDANSETRON HCL 4 MG/2ML IJ SOLN
4.0000 mg | Freq: Four times a day (QID) | INTRAMUSCULAR | Status: DC | PRN
  Administered 2020-08-09 – 2020-08-15 (×7): 4 mg via INTRAVENOUS
  Filled 2020-08-08 (×8): qty 2

## 2020-08-08 MED ORDER — DEXTROSE-NACL 5-0.45 % IV SOLN: INTRAVENOUS | Status: DC

## 2020-08-08 MED ORDER — LACTATED RINGERS IV SOLN: INTRAVENOUS | Status: DC | PRN

## 2020-08-08 MED ORDER — PROPOFOL 10 MG/ML IV BOLUS
INTRAVENOUS | Status: DC | PRN
  Administered 2020-08-08: 100 mg via INTRAVENOUS

## 2020-08-08 MED ORDER — ONDANSETRON HCL 4 MG/2ML IJ SOLN
INTRAMUSCULAR | Status: DC | PRN
  Administered 2020-08-08: 4 mg via INTRAVENOUS

## 2020-08-08 MED ORDER — ONDANSETRON HCL 4 MG/2ML IJ SOLN
4.0000 mg | Freq: Once | INTRAMUSCULAR | Status: AC
  Administered 2020-08-08: 4 mg via INTRAVENOUS
  Filled 2020-08-08: qty 2

## 2020-08-08 MED ORDER — SUFENTANIL CITRATE 50 MCG/ML IV SOLN
INTRAVENOUS | Status: DC | PRN
Start: 2020-08-08 — End: 2020-08-08
  Administered 2020-08-08: 20 ug via INTRAVENOUS
  Administered 2020-08-08 (×3): 10 ug via INTRAVENOUS

## 2020-08-08 MED ORDER — SUGAMMADEX SODIUM 200 MG/2ML IV SOLN
INTRAVENOUS | Status: DC | PRN
  Administered 2020-08-08: 200 mg via INTRAVENOUS

## 2020-08-08 SURGICAL SUPPLY — 46 items
APL PRP STRL LF DISP 70% ISPRP (MISCELLANEOUS) ×1
CANISTER WOUND CARE 500ML ATS (WOUND CARE) ×2 IMPLANT
CHLORAPREP W/TINT 26 (MISCELLANEOUS) ×2 IMPLANT
COVER SURGICAL LIGHT HANDLE (MISCELLANEOUS) ×2 IMPLANT
COVER WAND RF STERILE (DRAPES) ×2 IMPLANT
DRAPE LAPAROSCOPIC ABDOMINAL (DRAPES) ×2 IMPLANT
DRAPE WARM FLUID 44X44 (DRAPES) ×2 IMPLANT
DRSG VAC ATS LRG SENSATRAC (GAUZE/BANDAGES/DRESSINGS) ×2 IMPLANT
ELECT BLADE 6.5 EXT (BLADE) ×2 IMPLANT
ELECT CAUTERY BLADE 6.4 (BLADE) ×2 IMPLANT
ELECT REM PT RETURN 9FT ADLT (ELECTROSURGICAL) ×2
ELECTRODE REM PT RTRN 9FT ADLT (ELECTROSURGICAL) ×1 IMPLANT
GLOVE BIOGEL M STRL SZ7.5 (GLOVE) ×4 IMPLANT
GLOVE BIOGEL PI IND STRL 7.0 (GLOVE) ×1 IMPLANT
GLOVE BIOGEL PI IND STRL 7.5 (GLOVE) ×1 IMPLANT
GLOVE BIOGEL PI IND STRL 8 (GLOVE) ×1 IMPLANT
GLOVE BIOGEL PI INDICATOR 7.0 (GLOVE) ×1
GLOVE BIOGEL PI INDICATOR 7.5 (GLOVE) ×1
GLOVE BIOGEL PI INDICATOR 8 (GLOVE) ×1
GLOVE SURG SS PI 7.0 STRL IVOR (GLOVE) ×2 IMPLANT
GLOVE SURG SS PI 7.5 STRL IVOR (GLOVE) ×8 IMPLANT
GOWN STRL REUS W/ TWL LRG LVL3 (GOWN DISPOSABLE) ×2 IMPLANT
GOWN STRL REUS W/TWL LRG LVL3 (GOWN DISPOSABLE) ×4
HANDLE SUCTION POOLE (INSTRUMENTS) ×1 IMPLANT
KIT BASIN OR (CUSTOM PROCEDURE TRAY) ×2 IMPLANT
LIGASURE IMPACT 36 18CM CVD LR (INSTRUMENTS) ×2 IMPLANT
NEEDLE 22X1 1/2 (OR ONLY) (NEEDLE) ×2 IMPLANT
PACK GENERAL/GYN (CUSTOM PROCEDURE TRAY) ×2 IMPLANT
RELOAD PROXIMATE 75MM BLUE (ENDOMECHANICALS) ×6 IMPLANT
SPONGE LAP 18X18 RF (DISPOSABLE) ×4 IMPLANT
STAPLER GUN LINEAR PROX 60 (STAPLE) ×2 IMPLANT
STAPLER PROXIMATE 75MM BLUE (STAPLE) ×2 IMPLANT
STAPLER VISISTAT 35W (STAPLE) IMPLANT
SUCTION POOLE HANDLE (INSTRUMENTS) ×2
SUT NOVA NAB GS-21 0 18 T12 DT (SUTURE) ×2 IMPLANT
SUT PDS AB 1 TP1 54 (SUTURE) ×4 IMPLANT
SUT PDS AB 1 TP1 96 (SUTURE) IMPLANT
SUT SILK 2 0 (SUTURE) ×2
SUT SILK 2 0 SH CR/8 (SUTURE) ×2 IMPLANT
SUT SILK 2-0 18XBRD TIE 12 (SUTURE) ×1 IMPLANT
SUT SILK 3 0 (SUTURE) ×2
SUT SILK 3 0 SH CR/8 (SUTURE) ×4 IMPLANT
SUT SILK 3-0 18XBRD TIE 12 (SUTURE) ×1 IMPLANT
TOWEL GREEN STERILE (TOWEL DISPOSABLE) ×2 IMPLANT
TRAY FOLEY MTR SLVR 16FR STAT (SET/KITS/TRAYS/PACK) ×2 IMPLANT
YANKAUER SUCT BULB TIP NO VENT (SUCTIONS) ×2 IMPLANT
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
## 2020-08-08 NOTE — Anesthesia Procedure Notes (Signed)
Procedure Name: Intubation Date/Time: 08/08/2020 9:46 PM Performed by: Claris Che, CRNA Pre-anesthesia Checklist: Patient identified, Emergency Drugs available, Suction available, Patient being monitored and Timeout performed Patient Re-evaluated:Patient Re-evaluated prior to induction Oxygen Delivery Method: Circle system utilized Preoxygenation: Pre-oxygenation with 100% oxygen Induction Type: IV induction and Cricoid Pressure applied Laryngoscope Size: Glidescope, Mac and 4 Grade View: Grade I Number of attempts: 1 Airway Equipment and Method: Stylet and Video-laryngoscopy Placement Confirmation: ETT inserted through vocal cords under direct vision,  positive ETCO2 and breath sounds checked- equal and bilateral Secured at: 22 cm Tube secured with: Tape Dental Injury: Teeth and Oropharynx as per pre-operative assessment

## 2020-08-08 NOTE — Transfer of Care (Signed)
Immediate Anesthesia Transfer of Care Note  Patient: Lori Cooper  Procedure(s) Performed: EXPLORATORY LAPAROTOMY (N/A Abdomen) APPLICATION OF WOUND VAC (N/A Abdomen)  Patient Location: PACU  Anesthesia Type:General  Level of Consciousness: oriented, sedated, drowsy, patient cooperative and responds to stimulation  Airway & Oxygen Therapy: Patient Spontanous Breathing and Patient connected to face mask oxygen  Post-op Assessment: Report given to RN, Post -op Vital signs reviewed and stable and Patient moving all extremities X 4  Post vital signs: Reviewed and stable  Last Vitals:  Vitals Value Taken Time  BP    Temp    Pulse    Resp    SpO2      Last Pain:  Vitals:   08/08/20 1830  TempSrc: Oral  PainSc:          Complications: No complications documented.
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
## 2020-08-08 NOTE — Anesthesia Procedure Notes (Signed)
Arterial Line Insertion Start/End9/23/2021 9:57 PM, 08/08/2020 10:03 PM Performed by: Annye Asa, MD, anesthesiologist  Patient location: OR. Preanesthetic checklist: patient identified, IV checked, risks and benefits discussed, surgical consent, monitors and equipment checked, pre-op evaluation, timeout performed and anesthesia consent Right, radial was placed Catheter size: 20 G Hand hygiene performed , maximum sterile barriers used  and Seldinger technique used Allen's test indicative of satisfactory collateral circulation Attempts: 1 Procedure performed without using ultrasound guided technique. Following insertion, dressing applied and Biopatch. Post procedure assessment: normal  Patient tolerated the procedure well with no immediate complications.
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
## 2020-08-09 ENCOUNTER — Inpatient Hospital Stay (HOSPITAL_COMMUNITY): Payer: PPO

## 2020-08-09 ENCOUNTER — Encounter (HOSPITAL_COMMUNITY): Admission: EM | Disposition: A | Discharge status: Skilled Nursing Facility | Payer: Self-pay | Source: Home / Self Care

## 2020-08-09 ENCOUNTER — Encounter (HOSPITAL_COMMUNITY): Payer: Self-pay | Admitting: General Surgery

## 2020-08-09 LAB — CBC
HCT: 28 % — ABNORMAL LOW (ref 36.0–46.0)
HCT: 26.3 % — ABNORMAL LOW (ref 36.0–46.0)
Hemoglobin: 8.5 g/dL — ABNORMAL LOW (ref 12.0–15.0)
Hemoglobin: 8 g/dL — ABNORMAL LOW (ref 12.0–15.0)
MCH: 27.2 pg (ref 26.0–34.0)
MCH: 27 pg (ref 26.0–34.0)
MCHC: 30.4 g/dL (ref 30.0–36.0)
MCHC: 30.4 g/dL (ref 30.0–36.0)
MCV: 89.5 fL (ref 80.0–100.0)
MCV: 88.9 fL (ref 80.0–100.0)
Platelets: 350 10*3/uL (ref 150–400)
Platelets: 316 10*3/uL (ref 150–400)
RBC: 3.13 MIL/uL — ABNORMAL LOW (ref 3.87–5.11)
RBC: 2.96 MIL/uL — ABNORMAL LOW (ref 3.87–5.11)
RDW: 14.7 % (ref 11.5–15.5)
RDW: 14.8 % (ref 11.5–15.5)
WBC: 14.4 10*3/uL — ABNORMAL HIGH (ref 4.0–10.5)
WBC: 19 10*3/uL — ABNORMAL HIGH (ref 4.0–10.5)
nRBC: 0 % (ref 0.0–0.2)
nRBC: 0 % (ref 0.0–0.2)

## 2020-08-09 LAB — HEMOGLOBIN A1C
Hgb A1c MFr Bld: 6.1 % — ABNORMAL HIGH (ref 4.8–5.6)
Mean Plasma Glucose: 128.37 mg/dL

## 2020-08-09 LAB — BASIC METABOLIC PANEL
Anion gap: 11 (ref 5–15)
BUN: 11 mg/dL (ref 8–23)
CO2: 23 mmol/L (ref 22–32)
Calcium: 8.6 mg/dL — ABNORMAL LOW (ref 8.9–10.3)
Chloride: 105 mmol/L (ref 98–111)
Creatinine, Ser: 1.07 mg/dL — ABNORMAL HIGH (ref 0.44–1.00)
GFR calc Af Amer: >60 mL/min (ref >60)
GFR calc non Af Amer: 54 mL/min — ABNORMAL LOW (ref >60)
Glucose, Bld: 208 mg/dL — ABNORMAL HIGH (ref 70–99)
Potassium: 4 mmol/L (ref 3.5–5.1)
Sodium: 139 mmol/L (ref 135–145)

## 2020-08-09 LAB — SURGICAL PCR SCREEN
MRSA, PCR: NEGATIVE
Staphylococcus aureus: NEGATIVE

## 2020-08-09 LAB — GLUCOSE, CAPILLARY
Glucose-Capillary: 179 mg/dL — ABNORMAL HIGH (ref 70–99)
Glucose-Capillary: 206 mg/dL — ABNORMAL HIGH (ref 70–99)
Glucose-Capillary: 181 mg/dL — ABNORMAL HIGH (ref 70–99)
Glucose-Capillary: 167 mg/dL — ABNORMAL HIGH (ref 70–99)
Glucose-Capillary: 141 mg/dL — ABNORMAL HIGH (ref 70–99)
Glucose-Capillary: 151 mg/dL — ABNORMAL HIGH (ref 70–99)
Glucose-Capillary: 148 mg/dL — ABNORMAL HIGH (ref 70–99)

## 2020-08-09 LAB — URINALYSIS, MICROSCOPIC (REFLEX)

## 2020-08-09 LAB — POCT I-STAT 7, (LYTES, BLD GAS, ICA,H+H)
Acid-base deficit: 2 mmol/L (ref 0.0–2.0)
Bicarbonate: 23.3 mmol/L (ref 20.0–28.0)
Calcium, Ion: 1.15 mmol/L (ref 1.15–1.40)
HCT: 26 % — ABNORMAL LOW (ref 36.0–46.0)
Hemoglobin: 8.8 g/dL — ABNORMAL LOW (ref 12.0–15.0)
O2 Saturation: 96 %
Patient temperature: 35
Potassium: 3.9 mmol/L (ref 3.5–5.1)
Sodium: 140 mmol/L (ref 135–145)
TCO2: 25 mmol/L (ref 22–32)
pCO2 arterial: 38.8 mmHg (ref 32.0–48.0)
pH, Arterial: 7.377 (ref 7.350–7.450)
pO2, Arterial: 75 mmHg — ABNORMAL LOW (ref 83.0–108.0)

## 2020-08-09 LAB — URINALYSIS, ROUTINE W REFLEX MICROSCOPIC
Bilirubin Urine: NEGATIVE
Glucose, UA: 100 mg/dL — AB
Ketones, ur: NEGATIVE mg/dL
Leukocytes,Ua: NEGATIVE
Nitrite: NEGATIVE
Protein, ur: NEGATIVE mg/dL
Specific Gravity, Urine: 1.025 (ref 1.005–1.030)
pH: 5.5 (ref 5.0–8.0)

## 2020-08-09 LAB — PROTIME-INR
INR: 1.1 (ref 0.8–1.2)
Prothrombin Time: 13.6 seconds (ref 11.4–15.2)

## 2020-08-09 LAB — HIV ANTIBODY (ROUTINE TESTING W REFLEX): HIV Screen 4th Generation wRfx: NONREACTIVE

## 2020-08-09 LAB — APTT: aPTT: 24 seconds (ref 24–36)

## 2020-08-09 SURGERY — OPEN REDUCTION INTERNAL FIXATION (ORIF) ELBOW/OLECRANON FRACTURE
Anesthesia: General | Site: Elbow | Laterality: Left

## 2020-08-09 MED ORDER — CEFAZOLIN SODIUM-DEXTROSE 2-4 GM/100ML-% IV SOLN
2.0000 g | INTRAVENOUS | Status: DC
  Filled 2020-08-09: qty 100

## 2020-08-09 MED ORDER — ONDANSETRON HCL 4 MG/2ML IJ SOLN
INTRAMUSCULAR | Status: AC
  Filled 2020-08-09: qty 2

## 2020-08-09 MED ORDER — HYDROMORPHONE HCL 1 MG/ML IJ SOLN
INTRAMUSCULAR | Status: AC
  Filled 2020-08-09: qty 1

## 2020-08-09 MED ORDER — MORPHINE SULFATE (PF) 4 MG/ML IV SOLN
INTRAVENOUS | Status: AC
Start: 2020-08-09 — End: 2020-08-09
  Administered 2020-08-09: 4 mg
  Filled 2020-08-09: qty 1

## 2020-08-09 MED ORDER — CHLORHEXIDINE GLUCONATE CLOTH 2 % EX PADS
6.0000 | MEDICATED_PAD | Freq: Every day | CUTANEOUS | Status: DC
  Administered 2020-08-09 – 2020-08-22 (×13): 6 via TOPICAL

## 2020-08-09 MED ORDER — MORPHINE SULFATE (PF) 2 MG/ML IV SOLN
2.0000 mg | INTRAVENOUS | Status: DC | PRN
  Administered 2020-08-09 – 2020-08-14 (×21): 2 mg via INTRAVENOUS
  Filled 2020-08-09 (×22): qty 1

## 2020-08-09 MED ORDER — ALBUMIN HUMAN 5 % IV SOLN
INTRAVENOUS | Status: AC
  Filled 2020-08-09: qty 250

## 2020-08-09 MED ORDER — INSULIN ASPART 100 UNIT/ML ~~LOC~~ SOLN
SUBCUTANEOUS | Status: AC
  Filled 2020-08-09: qty 1

## 2020-08-09 MED ORDER — SODIUM CHLORIDE 0.9 % IV SOLN: INTRAVENOUS | Status: DC

## 2020-08-09 MED ORDER — SODIUM CHLORIDE 0.9 % IV BOLUS
500.0000 mL | Freq: Once | INTRAVENOUS | Status: AC
  Administered 2020-08-09: 500 mL via INTRAVENOUS

## 2020-08-09 MED ORDER — ORAL CARE MOUTH RINSE
15.0000 mL | Freq: Two times a day (BID) | OROMUCOSAL | Status: DC
  Administered 2020-08-09 – 2020-08-22 (×25): 15 mL via OROMUCOSAL

## 2020-08-09 MED ORDER — POTASSIUM CHLORIDE IN NACL 20-0.9 MEQ/L-% IV SOLN
INTRAVENOUS | Status: DC
  Administered 2020-08-11: 100 mL/h via INTRAVENOUS
  Filled 2020-08-09 (×13): qty 1000
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
## 2020-08-09 NOTE — Progress Notes (Signed)
Orthopedic Tech Progress Note Patient Details:  Lori Cooper [birth year] [id number]  Ortho Devices Type of Ortho Device: Post (long) splint Splint Material: Fiberglass Ortho Device/Splint Location: LUE Ortho Device/Splint Interventions: Application   Post Interventions Patient Tolerated: Well   Linus Salmons Javaun Dimperio 08/09/2020, 12:31 AM
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
## 2020-08-09 NOTE — Progress Notes (Signed)
I have discussed this patient with Dr. Marcelino Scot who has graciously agreed to assume her orthopedic care.

## 2020-08-09 NOTE — Progress Notes (Signed)
Pt having low urine output.  Only 80mL amber urine noted via foley over 4 hrs.  Low urine output also noted by day shift RN.  Dr. Kae Heller, trauma MD on call notified.  551mL bolus ordered.

## 2020-08-09 NOTE — Progress Notes (Signed)
Patient ID: Lori Cooper, female   DOB: 1955/12/25, 64 y.o.   MRN: [id number] I spoke with her sister on the phone and updated her.   Georganna Skeans, MD, MPH, FACS Please use [email or web address] to contact on call provider 08/09/2020
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
## 2020-08-09 NOTE — Progress Notes (Addendum)
Left long arm splint placed at bedside by ortho tech.  Patient premedicated with 0.5mg  IV dilaudid and ice pack placed on elbow.  Tolerated well.  Will continue to monitor.

## 2020-08-09 NOTE — Progress Notes (Signed)
Rehab Admissions Coordinator Note:  Patient was screened by Cleatrice Burke for appropriateness for an Inpatient Acute Rehab Consult per OT recs. Noted for OR.   At this time, we are recommending Inpatient Rehab consult postoperatively as appropriate.  Cleatrice Burke RN MSN 08/09/2020, 10:01 AM  I can be reached at [phone].

## 2020-08-09 NOTE — Progress Notes (Signed)
Per Dr. Kieth Brightly, okay for patient to have ice chips.

## 2020-08-10 ENCOUNTER — Inpatient Hospital Stay (HOSPITAL_COMMUNITY): Payer: PPO | Admitting: Certified Registered Nurse Anesthetist

## 2020-08-10 ENCOUNTER — Inpatient Hospital Stay (HOSPITAL_COMMUNITY): Payer: PPO

## 2020-08-10 ENCOUNTER — Encounter (HOSPITAL_COMMUNITY): Admission: EM | Disposition: A | Discharge status: Skilled Nursing Facility | Payer: Self-pay | Source: Home / Self Care

## 2020-08-10 HISTORY — PX: ORIF ELBOW FRACTURE: SHX5031

## 2020-08-10 LAB — CBC
HCT: 23.6 % — ABNORMAL LOW (ref 36.0–46.0)
Hemoglobin: 7 g/dL — ABNORMAL LOW (ref 12.0–15.0)
MCH: 27.2 pg (ref 26.0–34.0)
MCHC: 29.7 g/dL — ABNORMAL LOW (ref 30.0–36.0)
MCV: 91.8 fL (ref 80.0–100.0)
Platelets: 308 10*3/uL (ref 150–400)
RBC: 2.57 MIL/uL — ABNORMAL LOW (ref 3.87–5.11)
RDW: 15 % (ref 11.5–15.5)
WBC: 18.3 10*3/uL — ABNORMAL HIGH (ref 4.0–10.5)
nRBC: 0 % (ref 0.0–0.2)

## 2020-08-10 LAB — POCT I-STAT, CHEM 8
BUN: 24 mg/dL — ABNORMAL HIGH (ref 8–23)
Calcium, Ion: 1.19 mmol/L (ref 1.15–1.40)
Chloride: 105 mmol/L (ref 98–111)
Creatinine, Ser: 1.3 mg/dL — ABNORMAL HIGH (ref 0.44–1.00)
Glucose, Bld: 126 mg/dL — ABNORMAL HIGH (ref 70–99)
HCT: 22 % — ABNORMAL LOW (ref 36.0–46.0)
Hemoglobin: 7.5 g/dL — ABNORMAL LOW (ref 12.0–15.0)
Potassium: 4.6 mmol/L (ref 3.5–5.1)
Sodium: 139 mmol/L (ref 135–145)
TCO2: 21 mmol/L — ABNORMAL LOW (ref 22–32)

## 2020-08-10 LAB — GLUCOSE, CAPILLARY
Glucose-Capillary: 134 mg/dL — ABNORMAL HIGH (ref 70–99)
Glucose-Capillary: 124 mg/dL — ABNORMAL HIGH (ref 70–99)
Glucose-Capillary: 148 mg/dL — ABNORMAL HIGH (ref 70–99)
Glucose-Capillary: 143 mg/dL — ABNORMAL HIGH (ref 70–99)
Glucose-Capillary: 95 mg/dL (ref 70–99)
Glucose-Capillary: 105 mg/dL — ABNORMAL HIGH (ref 70–99)

## 2020-08-10 LAB — BASIC METABOLIC PANEL
Anion gap: 9 (ref 5–15)
BUN: 23 mg/dL (ref 8–23)
CO2: 21 mmol/L — ABNORMAL LOW (ref 22–32)
Calcium: 8.1 mg/dL — ABNORMAL LOW (ref 8.9–10.3)
Chloride: 108 mmol/L (ref 98–111)
Creatinine, Ser: 1.52 mg/dL — ABNORMAL HIGH (ref 0.44–1.00)
GFR calc Af Amer: 41 mL/min — ABNORMAL LOW (ref >60)
GFR calc non Af Amer: 36 mL/min — ABNORMAL LOW (ref >60)
Glucose, Bld: 140 mg/dL — ABNORMAL HIGH (ref 70–99)
Potassium: 4.5 mmol/L (ref 3.5–5.1)
Sodium: 138 mmol/L (ref 135–145)

## 2020-08-10 LAB — ABO/RH: ABO/RH(D): A POS

## 2020-08-10 SURGERY — OPEN REDUCTION INTERNAL FIXATION (ORIF) ELBOW/OLECRANON FRACTURE
Anesthesia: General | Site: Elbow | Laterality: Left

## 2020-08-10 MED ORDER — MIDAZOLAM HCL 2 MG/2ML IJ SOLN
INTRAMUSCULAR | Status: AC
  Filled 2020-08-10: qty 2

## 2020-08-10 MED ORDER — MIDAZOLAM HCL 5 MG/5ML IJ SOLN
INTRAMUSCULAR | Status: DC | PRN
  Administered 2020-08-10: 2 mg via INTRAVENOUS

## 2020-08-10 MED ORDER — LIDOCAINE HCL (CARDIAC) PF 100 MG/5ML IV SOSY
PREFILLED_SYRINGE | INTRAVENOUS | Status: DC | PRN
  Administered 2020-08-10: 60 mg via INTRATRACHEAL

## 2020-08-10 MED ORDER — CEFAZOLIN SODIUM-DEXTROSE 2-3 GM-%(50ML) IV SOLR
INTRAVENOUS | Status: DC | PRN
  Administered 2020-08-10: 2 g via INTRAVENOUS

## 2020-08-10 MED ORDER — PROPOFOL 10 MG/ML IV BOLUS
INTRAVENOUS | Status: DC | PRN
  Administered 2020-08-10: 140 mg via INTRAVENOUS

## 2020-08-10 MED ORDER — LACTATED RINGERS IV SOLN: INTRAVENOUS | Status: DC | PRN

## 2020-08-10 MED ORDER — BUPIVACAINE HCL (PF) 0.25 % IJ SOLN
INTRAMUSCULAR | Status: DC | PRN
  Administered 2020-08-10: 20 mL

## 2020-08-10 MED ORDER — PHENYLEPHRINE HCL-NACL 10-0.9 MG/250ML-% IV SOLN
INTRAVENOUS | Status: DC | PRN
  Administered 2020-08-10: 40 ug/min via INTRAVENOUS

## 2020-08-10 MED ORDER — VASOPRESSIN 20 UNIT/ML IV SOLN
INTRAVENOUS | Status: AC
  Filled 2020-08-10: qty 1

## 2020-08-10 MED ORDER — FENTANYL CITRATE (PF) 250 MCG/5ML IJ SOLN
INTRAMUSCULAR | Status: DC | PRN
  Administered 2020-08-10: 100 ug via INTRAVENOUS
  Administered 2020-08-10: 50 ug via INTRAVENOUS

## 2020-08-10 MED ORDER — PROPOFOL 10 MG/ML IV BOLUS
INTRAVENOUS | Status: AC
  Filled 2020-08-10: qty 40

## 2020-08-10 MED ORDER — CEFAZOLIN SODIUM-DEXTROSE 2-4 GM/100ML-% IV SOLN
2.0000 g | Freq: Three times a day (TID) | INTRAVENOUS | Status: AC
  Administered 2020-08-10 – 2020-08-11 (×3): 2 g via INTRAVENOUS
  Filled 2020-08-10 (×3): qty 100

## 2020-08-10 MED ORDER — SODIUM CHLORIDE 0.9% IV SOLUTION: Freq: Once | INTRAVENOUS | Status: DC

## 2020-08-10 MED ORDER — SUGAMMADEX SODIUM 200 MG/2ML IV SOLN
INTRAVENOUS | Status: DC | PRN
  Administered 2020-08-10: 200 mg via INTRAVENOUS

## 2020-08-10 MED ORDER — BUPIVACAINE HCL (PF) 0.25 % IJ SOLN
INTRAMUSCULAR | Status: AC
  Filled 2020-08-10: qty 30

## 2020-08-10 MED ORDER — 0.9 % SODIUM CHLORIDE (POUR BTL) OPTIME
TOPICAL | Status: DC | PRN
  Administered 2020-08-10: 1000 mL

## 2020-08-10 MED ORDER — FENTANYL CITRATE (PF) 250 MCG/5ML IJ SOLN
INTRAMUSCULAR | Status: AC
  Filled 2020-08-10: qty 5

## 2020-08-10 MED ORDER — SUCCINYLCHOLINE CHLORIDE 20 MG/ML IJ SOLN
INTRAMUSCULAR | Status: DC | PRN
  Administered 2020-08-10: 100 mg via INTRAVENOUS

## 2020-08-10 MED ORDER — SCOPOLAMINE 1 MG/3DAYS TD PT72
1.0000 | MEDICATED_PATCH | TRANSDERMAL | Status: DC | PRN
  Administered 2020-08-12: 1.5 mg via TRANSDERMAL
  Filled 2020-08-10: qty 1

## 2020-08-10 MED ORDER — ROCURONIUM BROMIDE 100 MG/10ML IV SOLN
INTRAVENOUS | Status: DC | PRN
  Administered 2020-08-10: 10 mg via INTRAVENOUS
  Administered 2020-08-10: 40 mg via INTRAVENOUS

## 2020-08-10 MED ORDER — PHENYLEPHRINE 40 MCG/ML (10ML) SYRINGE FOR IV PUSH (FOR BLOOD PRESSURE SUPPORT)
PREFILLED_SYRINGE | INTRAVENOUS | Status: DC | PRN
  Administered 2020-08-10: 120 ug via INTRAVENOUS

## 2020-08-10 SURGICAL SUPPLY — 87 items
BENZOIN TINCTURE PRP APPL 2/3 (GAUZE/BANDAGES/DRESSINGS) IMPLANT
BIT DRILL 2.0 LNG QUCK RELEASE (BIT) ×1 IMPLANT
BIT DRILL 2.8 QUICK RELEASE (BIT) ×1 IMPLANT
BLADE AVERAGE 25X9 (BLADE) ×2 IMPLANT
BLADE CLIPPER SURG (BLADE) IMPLANT
BLADE SURG 10 STRL SS (BLADE) ×2 IMPLANT
BNDG CMPR 9X4 STRL LF SNTH (GAUZE/BANDAGES/DRESSINGS) ×1
BNDG COHESIVE 4X5 TAN STRL (GAUZE/BANDAGES/DRESSINGS) ×2 IMPLANT
BNDG ELASTIC 3X5.8 VLCR STR LF (GAUZE/BANDAGES/DRESSINGS) ×2 IMPLANT
BNDG ELASTIC 4X5.8 VLCR STR LF (GAUZE/BANDAGES/DRESSINGS) ×2 IMPLANT
BNDG ELASTIC 6X5.8 VLCR STR LF (GAUZE/BANDAGES/DRESSINGS) IMPLANT
BNDG ESMARK 4X9 LF (GAUZE/BANDAGES/DRESSINGS) ×2 IMPLANT
BNDG GAUZE ELAST 4 BULKY (GAUZE/BANDAGES/DRESSINGS) ×2 IMPLANT
BRUSH SCRUB EZ PLAIN DRY (MISCELLANEOUS) IMPLANT
CLEANER TIP ELECTROSURG 2X2 (MISCELLANEOUS) ×2 IMPLANT
COVER SURGICAL LIGHT HANDLE (MISCELLANEOUS) ×4 IMPLANT
CUFF TOURN SGL QUICK 18X4 (TOURNIQUET CUFF) ×2 IMPLANT
CUFF TOURN SGL QUICK 24 (TOURNIQUET CUFF)
CUFF TRNQT CYL 24X4X16.5-23 (TOURNIQUET CUFF) IMPLANT
DECANTER SPIKE VIAL GLASS SM (MISCELLANEOUS) IMPLANT
DRAPE C-ARM 42X72 X-RAY (DRAPES) IMPLANT
DRAPE C-ARMOR (DRAPES) ×2 IMPLANT
DRAPE INCISE IOBAN 66X45 STRL (DRAPES) ×2 IMPLANT
DRAPE U-SHAPE 47X51 STRL (DRAPES) ×2 IMPLANT
DRILL 2.0 LNG QUICK RELEASE (BIT) ×2
DRILL 2.8 QUICK RELEASE (BIT) ×2
DRSG ADAPTIC 3X8 NADH LF (GAUZE/BANDAGES/DRESSINGS) ×2 IMPLANT
DRSG EMULSION OIL 3X3 NADH (GAUZE/BANDAGES/DRESSINGS) IMPLANT
ELECT REM PT RETURN 9FT ADLT (ELECTROSURGICAL) ×2
ELECTRODE REM PT RTRN 9FT ADLT (ELECTROSURGICAL) ×1 IMPLANT
GAUZE SPONGE 4X4 12PLY STRL (GAUZE/BANDAGES/DRESSINGS) ×2 IMPLANT
GAUZE XEROFORM 1X8 LF (GAUZE/BANDAGES/DRESSINGS) IMPLANT
GAUZE XEROFORM 5X9 LF (GAUZE/BANDAGES/DRESSINGS) IMPLANT
GLOVE BIO SURGEON STRL SZ7.5 (GLOVE) ×6 IMPLANT
GLOVE BIO SURGEON STRL SZ8 (GLOVE) ×4 IMPLANT
GLOVE BIOGEL PI IND STRL 7.5 (GLOVE) ×2 IMPLANT
GLOVE BIOGEL PI IND STRL 8 (GLOVE) ×1 IMPLANT
GLOVE BIOGEL PI INDICATOR 7.5 (GLOVE) ×2
GLOVE BIOGEL PI INDICATOR 8 (GLOVE) ×1
GOWN STRL REUS W/ TWL LRG LVL3 (GOWN DISPOSABLE) ×2 IMPLANT
GOWN STRL REUS W/ TWL XL LVL3 (GOWN DISPOSABLE) ×2 IMPLANT
GOWN STRL REUS W/TWL LRG LVL3 (GOWN DISPOSABLE) ×4
GOWN STRL REUS W/TWL XL LVL3 (GOWN DISPOSABLE) ×4
GUIDEWIRE ORTH 6X062XTROC NS (WIRE) ×1 IMPLANT
GUIDEWIRE ORTHO 2.0X9 ST (WIRE) ×2 IMPLANT
K-WIRE .062 (WIRE) ×2
KIT BASIN OR (CUSTOM PROCEDURE TRAY) ×2 IMPLANT
KIT TURNOVER KIT B (KITS) ×2 IMPLANT
NS IRRIG 1000ML POUR BTL (IV SOLUTION) ×2 IMPLANT
PACK ORTHO EXTREMITY (CUSTOM PROCEDURE TRAY) ×2 IMPLANT
PAD ARMBOARD 7.5X6 YLW CONV (MISCELLANEOUS) ×4 IMPLANT
PAD CAST 4YDX4 CTTN HI CHSV (CAST SUPPLIES) IMPLANT
PADDING CAST COTTON 4X4 STRL (CAST SUPPLIES)
PLATE OLECRANON 5HOLE LEFT STD (Plate) ×2 IMPLANT
PLATE ULNA MIDSHAFT 6 HOLE (Plate) ×2 IMPLANT
SCREW HEX LOCK 2.7X16MM (Screw) ×4 IMPLANT
SCREW HEXALOBE LOCKING 3.5X16M (Screw) ×2 IMPLANT
SCREW HEXALOBE NON-LOCK 3.5X14 (Screw) ×4 IMPLANT
SCREW LOCK 12X3.5X HEXALOBE (Screw) ×1 IMPLANT
SCREW LOCK 28X3.5X HEXALOBE (Screw) ×1 IMPLANT
SCREW LOCKING 3.5X12 (Screw) ×2 IMPLANT
SCREW LOCKING 3.5X28 (Screw) ×2 IMPLANT
SCREW NON LOCKING HEX 3.5X18MM (Screw) ×2 IMPLANT
SCREW NON LOCKING HEX 3.5X24 (Screw) ×2 IMPLANT
SCREW NON LOCKING HEX 3.5X50MM (Screw) ×2 IMPLANT
SCREW NONLOCK HEX 3.5X12 (Screw) ×8 IMPLANT
SPONGE LAP 18X18 RF (DISPOSABLE) ×4 IMPLANT
STAPLER VISISTAT 35W (STAPLE) ×2 IMPLANT
STRIP CLOSURE SKIN 1/2X4 (GAUZE/BANDAGES/DRESSINGS) ×2 IMPLANT
SUCTION FRAZIER HANDLE 10FR (MISCELLANEOUS) ×2
SUCTION TUBE FRAZIER 10FR DISP (MISCELLANEOUS) ×1 IMPLANT
SUT ETHILON 2 0 FS 18 (SUTURE) ×6 IMPLANT
SUT PDS AB 2-0 CT1 27 (SUTURE) ×2 IMPLANT
SUT PROLENE 3 0 PS 2 (SUTURE) IMPLANT
SUT VIC AB 0 CT1 27 (SUTURE)
SUT VIC AB 0 CT1 27XBRD ANBCTR (SUTURE) IMPLANT
SUT VIC AB 2-0 CT1 27 (SUTURE) ×6
SUT VIC AB 2-0 CT1 TAPERPNT 27 (SUTURE) ×3 IMPLANT
SUT VIC AB 2-0 CT3 27 (SUTURE) IMPLANT
SYR CONTROL 10ML LL (SYRINGE) ×2 IMPLANT
TOWEL GREEN STERILE (TOWEL DISPOSABLE) ×4 IMPLANT
TOWEL GREEN STERILE FF (TOWEL DISPOSABLE) ×2 IMPLANT
TUBE CONNECTING 12X1/4 (SUCTIONS) ×2 IMPLANT
UNDERPAD 30X36 HEAVY ABSORB (UNDERPADS AND DIAPERS) ×2 IMPLANT
WATER STERILE IRR 1000ML POUR (IV SOLUTION) ×2 IMPLANT
WIRE TACK PLATE PL-PTACK (Wire) ×2 IMPLANT
YANKAUER SUCT BULB TIP NO VENT (SUCTIONS) ×2 IMPLANT
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
## 2020-08-10 NOTE — Anesthesia Procedure Notes (Signed)
Procedure Name: Intubation Date/Time: 08/10/2020 12:30 PM Performed by: Clovis Cao, CRNA Pre-anesthesia Checklist: Patient identified, Emergency Drugs available, Suction available, Patient being monitored and Timeout performed Patient Re-evaluated:Patient Re-evaluated prior to induction Oxygen Delivery Method: Circle system utilized Preoxygenation: Pre-oxygenation with 100% oxygen Induction Type: IV induction, Rapid sequence and Cricoid Pressure applied Laryngoscope Size: 2 Grade View: Grade I Tube type: Oral Tube size: 7.5 mm Number of attempts: 1 Airway Equipment and Method: Stylet Placement Confirmation: ETT inserted through vocal cords under direct vision,  positive ETCO2 and breath sounds checked- equal and bilateral Secured at: 21 cm Tube secured with: Tape Dental Injury: Teeth and Oropharynx as per pre-operative assessment

## 2020-08-10 NOTE — Anesthesia Postprocedure Evaluation (Signed)
Anesthesia Post Note  Patient: Lori Cooper  Procedure(s) Performed: OPEN REDUCTION INTERNAL FIXATION (ORIF) OLECRANON and ULNA FRACTURE (Left Elbow)     Patient location during evaluation: PACU Anesthesia Type: General Level of consciousness: awake and alert Pain management: pain level controlled Vital Signs Assessment: post-procedure vital signs reviewed and stable Respiratory status: spontaneous breathing, nonlabored ventilation, respiratory function stable and patient connected to nasal cannula oxygen Cardiovascular status: blood pressure returned to baseline and stable Postop Assessment: no apparent nausea or vomiting Anesthetic complications: no   No complications documented.  Last Vitals:  Vitals:   08/10/20 1420 08/10/20 1500  BP: (!) 151/74   Pulse: (!) 104 98  Resp: 16 20  Temp: 36.6 C   SpO2: (!) 88% 92%    Last Pain:  Vitals:   08/10/20 1430  TempSrc:   PainSc: 0-No pain                 Craigory Toste DAVID

## 2020-08-10 NOTE — OR Nursing (Signed)
CBG 148
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
## 2020-08-10 NOTE — Anesthesia Preprocedure Evaluation (Addendum)
Anesthesia Evaluation  Patient identified by MRN, date of birth, ID band Patient awake    Reviewed: Allergy & Precautions, NPO status , Patient's Chart, lab work & pertinent test results  Airway Mallampati: I  TM Distance: >3 FB Neck ROM: Full    Dental   Pulmonary former smoker,    Pulmonary exam normal        Cardiovascular METS: Normal cardiovascular exam     Neuro/Psych Anxiety Depression    GI/Hepatic GERD  Medicated and Controlled,  Endo/Other    Renal/GU      Musculoskeletal   Abdominal   Peds  Hematology   Anesthesia Other Findings   Reproductive/Obstetrics                            Anesthesia Physical Anesthesia Plan  ASA: III  Anesthesia Plan: General   Post-op Pain Management:    Induction: Intravenous  PONV Risk Score and Plan: 2 and Ondansetron, Midazolam and Treatment may vary due to age or medical condition  Airway Management Planned: Oral ETT  Additional Equipment:   Intra-op Plan:   Post-operative Plan: Possible Post-op intubation/ventilation  Informed Consent: I have reviewed the patients History and Physical, chart, labs and discussed the procedure including the risks, benefits and alternatives for the proposed anesthesia with the patient or authorized representative who has indicated his/her understanding and acceptance.       Plan Discussed with: Surgeon and CRNA  Anesthesia Plan Comments:        Anesthesia Quick Evaluation

## 2020-08-10 NOTE — Transfer of Care (Signed)
Immediate Anesthesia Transfer of Care Note  Patient: Lori Cooper  Procedure(s) Performed: OPEN REDUCTION INTERNAL FIXATION (ORIF) OLECRANON and ULNA FRACTURE (Left Elbow)  Patient Location: PACU  Anesthesia Type:General  Level of Consciousness: drowsy  Airway & Oxygen Therapy: Patient Spontanous Breathing, Patient connected to face mask oxygen and non-rebreather face mask  Post-op Assessment: Report given to RN and Post -op Vital signs reviewed and stable  Post vital signs: Reviewed and stable  Last Vitals:  Vitals Value Taken Time  BP 151/74 08/10/20 1427  Temp 36.6 C 08/10/20 1420  Pulse 102 08/10/20 1431  Resp 20 08/10/20 1431  SpO2 86 % 08/10/20 1431  Vitals shown include unvalidated device data.  Last Pain:  Vitals:   08/10/20 1420  TempSrc:   PainSc: Asleep      Patients Stated Pain Goal: 4 (12/45/80 9983)  Complications: No complications documented.
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
## 2020-08-10 NOTE — Progress Notes (Signed)
Patient arrived from 4N with RN at bedside. Patient switched to Short Stay monitor on arrival. Oxygen saturations noted to be mid 80's. Dr. Conrad Cushing at bedside. Patient saturations started decreasing into high 70's. Patient encouraged to take deep breaths. Patient reports it is hurting to take deep breaths. Patient switched to face mask with minimal response. Patient repositioned and sit up in bed. Switched O2 to 10 lpm via NRB with oxygen saturation improvement. Almyra Free, CRNA and [place], CRNA at bedside.

## 2020-08-11 ENCOUNTER — Encounter (HOSPITAL_COMMUNITY): Payer: Self-pay

## 2020-08-11 DIAGNOSIS — S52022A Displaced fracture of olecranon process without intraarticular extension of left ulna, initial encounter for closed fracture: Secondary | ICD-10-CM | POA: Diagnosis present

## 2020-08-11 DIAGNOSIS — S52202A Unspecified fracture of shaft of left ulna, initial encounter for closed fracture: Secondary | ICD-10-CM

## 2020-08-11 HISTORY — DX: Unspecified fracture of shaft of left ulna, initial encounter for closed fracture: S52.202A

## 2020-08-11 HISTORY — DX: Displaced fracture of olecranon process without intraarticular extension of left ulna, initial encounter for closed fracture: S52.022A

## 2020-08-11 LAB — CBC
HCT: 28 % — ABNORMAL LOW (ref 36.0–46.0)
Hemoglobin: 8.7 g/dL — ABNORMAL LOW (ref 12.0–15.0)
MCH: 27.6 pg (ref 26.0–34.0)
MCHC: 31.1 g/dL (ref 30.0–36.0)
MCV: 88.9 fL (ref 80.0–100.0)
Platelets: 239 10*3/uL (ref 150–400)
RBC: 3.15 MIL/uL — ABNORMAL LOW (ref 3.87–5.11)
RDW: 15.1 % (ref 11.5–15.5)
WBC: 15.2 10*3/uL — ABNORMAL HIGH (ref 4.0–10.5)
nRBC: 0.1 % (ref 0.0–0.2)

## 2020-08-11 LAB — BASIC METABOLIC PANEL
Anion gap: 9 (ref 5–15)
BUN: 17 mg/dL (ref 8–23)
CO2: 22 mmol/L (ref 22–32)
Calcium: 8.2 mg/dL — ABNORMAL LOW (ref 8.9–10.3)
Chloride: 105 mmol/L (ref 98–111)
Creatinine, Ser: 0.83 mg/dL (ref 0.44–1.00)
GFR calc Af Amer: >60 mL/min (ref >60)
GFR calc non Af Amer: >60 mL/min (ref >60)
Glucose, Bld: 109 mg/dL — ABNORMAL HIGH (ref 70–99)
Potassium: 4.5 mmol/L (ref 3.5–5.1)
Sodium: 136 mmol/L (ref 135–145)

## 2020-08-11 LAB — GLUCOSE, CAPILLARY
Glucose-Capillary: 100 mg/dL — ABNORMAL HIGH (ref 70–99)
Glucose-Capillary: 110 mg/dL — ABNORMAL HIGH (ref 70–99)
Glucose-Capillary: 86 mg/dL (ref 70–99)
Glucose-Capillary: 87 mg/dL (ref 70–99)
Glucose-Capillary: 92 mg/dL (ref 70–99)
Glucose-Capillary: 99 mg/dL (ref 70–99)

## 2020-08-11 LAB — PREPARE RBC (CROSSMATCH)
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
## 2020-08-11 NOTE — Progress Notes (Signed)
Orthopedic Tech Progress Note Patient Details:  Lori Cooper 01-May-1956 [id number]  Ortho Devices Type of Ortho Device: Shoulder immobilizer Splint Material: Fiberglass Ortho Device/Splint Location: LUE Ortho Device/Splint Interventions: Ordered, Application   Post Interventions Patient Tolerated: Well Instructions Provided: Care of device   Braulio Bosch 08/11/2020, 4:47 PM

## 2020-08-12 LAB — BASIC METABOLIC PANEL
Anion gap: 11 (ref 5–15)
BUN: 9 mg/dL (ref 8–23)
CO2: 23 mmol/L (ref 22–32)
Calcium: 8.5 mg/dL — ABNORMAL LOW (ref 8.9–10.3)
Chloride: 101 mmol/L (ref 98–111)
Creatinine, Ser: 0.57 mg/dL (ref 0.44–1.00)
GFR calc Af Amer: >60 mL/min (ref >60)
GFR calc non Af Amer: >60 mL/min (ref >60)
Glucose, Bld: 83 mg/dL (ref 70–99)
Potassium: 4.2 mmol/L (ref 3.5–5.1)
Sodium: 135 mmol/L (ref 135–145)

## 2020-08-12 LAB — GLUCOSE, CAPILLARY
Glucose-Capillary: 67 mg/dL — ABNORMAL LOW (ref 70–99)
Glucose-Capillary: 76 mg/dL (ref 70–99)
Glucose-Capillary: 77 mg/dL (ref 70–99)
Glucose-Capillary: 78 mg/dL (ref 70–99)
Glucose-Capillary: 78 mg/dL (ref 70–99)
Glucose-Capillary: 94 mg/dL (ref 70–99)

## 2020-08-12 LAB — BPAM RBC
Blood Product Expiration Date: 202110182359
Blood Product Expiration Date: 202110222359
ISSUE DATE / TIME: 202109251209
ISSUE DATE / TIME: 202109251209
Unit Type and Rh: 6200
Unit Type and Rh: 6200

## 2020-08-12 LAB — TYPE AND SCREEN
ABO/RH(D): A POS
Antibody Screen: NEGATIVE
Unit division: 0
Unit division: 0

## 2020-08-12 LAB — CBC
HCT: 32.6 % — ABNORMAL LOW (ref 36.0–46.0)
Hemoglobin: 10.4 g/dL — ABNORMAL LOW (ref 12.0–15.0)
MCH: 28.3 pg (ref 26.0–34.0)
MCHC: 31.9 g/dL (ref 30.0–36.0)
MCV: 88.8 fL (ref 80.0–100.0)
Platelets: 255 10*3/uL (ref 150–400)
RBC: 3.67 MIL/uL — ABNORMAL LOW (ref 3.87–5.11)
RDW: 15 % (ref 11.5–15.5)
WBC: 13.6 10*3/uL — ABNORMAL HIGH (ref 4.0–10.5)
nRBC: 0 % (ref 0.0–0.2)

## 2020-08-12 LAB — VITAMIN D 25 HYDROXY (VIT D DEFICIENCY, FRACTURES): Vit D, 25-Hydroxy: 26.45 ng/mL — ABNORMAL LOW (ref 30–100)

## 2020-08-12 LAB — SURGICAL PATHOLOGY

## 2020-08-12 MED ORDER — PROMETHAZINE HCL 25 MG/ML IJ SOLN
12.5000 mg | Freq: Four times a day (QID) | INTRAMUSCULAR | Status: DC | PRN
  Administered 2020-08-12 – 2020-08-18 (×6): 12.5 mg via INTRAVENOUS
  Filled 2020-08-12 (×6): qty 1

## 2020-08-12 MED ORDER — BETHANECHOL CHLORIDE 25 MG PO TABS
25.0000 mg | ORAL_TABLET | Freq: Three times a day (TID) | ORAL | Status: DC
  Administered 2020-08-12 – 2020-08-23 (×29): 25 mg via ORAL
  Filled 2020-08-12: qty 3
  Filled 2020-08-12 (×11): qty 1
  Filled 2020-08-12: qty 3
  Filled 2020-08-12 (×7): qty 1
  Filled 2020-08-12: qty 3
  Filled 2020-08-12 (×12): qty 1

## 2020-08-12 NOTE — TOC CAGE-AID Note (Signed)
Transition of Care [facility]) - CAGE-AID Screening   Patient Details  Name: Lori Cooper MRN: [id number] Date of Birth: [birth year]  [facility]) CM/SW Contact:    Emeterio Reeve, [place] Phone Number: 08/12/2020, 3:40 PM   Clinical Narrative:  CSW met with pt at bedside. CSW introduced self and explained her role at the hospital.  PT denies alcohol use and substance use. Pt did not need any resources at this time.    CAGE-AID Screening:    Have You Ever Felt You Ought to Cut Down on Your Drinking or Drug Use?: No Have People Annoyed You By Critizing Your Drinking Or Drug Use?: No Have You Felt Bad Or Guilty About Your Drinking Or Drug Use?: No Have You Ever Had a Drink or Used Drugs First Thing In The Morning to Steady Your Nerves or to Get Rid of a Hangover?: No CAGE-AID Score: 0  Substance Abuse Education Offered: Yes    Blima Ledger, [place] Social Worker [phone]
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
## 2020-08-13 LAB — BASIC METABOLIC PANEL
Anion gap: 10 (ref 5–15)
BUN: 11 mg/dL (ref 8–23)
CO2: 22 mmol/L (ref 22–32)
Calcium: 8.2 mg/dL — ABNORMAL LOW (ref 8.9–10.3)
Chloride: 105 mmol/L (ref 98–111)
Creatinine, Ser: 0.62 mg/dL (ref 0.44–1.00)
GFR calc Af Amer: >60 mL/min (ref >60)
GFR calc non Af Amer: >60 mL/min (ref >60)
Glucose, Bld: 85 mg/dL (ref 70–99)
Potassium: 4.8 mmol/L (ref 3.5–5.1)
Sodium: 137 mmol/L (ref 135–145)

## 2020-08-13 LAB — CBC
HCT: 34.8 % — ABNORMAL LOW (ref 36.0–46.0)
Hemoglobin: 10.6 g/dL — ABNORMAL LOW (ref 12.0–15.0)
MCH: 27.4 pg (ref 26.0–34.0)
MCHC: 30.5 g/dL (ref 30.0–36.0)
MCV: 89.9 fL (ref 80.0–100.0)
Platelets: 214 10*3/uL (ref 150–400)
RBC: 3.87 MIL/uL (ref 3.87–5.11)
RDW: 15.5 % (ref 11.5–15.5)
WBC: 14.1 10*3/uL — ABNORMAL HIGH (ref 4.0–10.5)
nRBC: 0 % (ref 0.0–0.2)

## 2020-08-13 LAB — GLUCOSE, CAPILLARY
Glucose-Capillary: 110 mg/dL — ABNORMAL HIGH (ref 70–99)
Glucose-Capillary: 66 mg/dL — ABNORMAL LOW (ref 70–99)
Glucose-Capillary: 74 mg/dL (ref 70–99)
Glucose-Capillary: 80 mg/dL (ref 70–99)
Glucose-Capillary: 82 mg/dL (ref 70–99)
Glucose-Capillary: 83 mg/dL (ref 70–99)
Glucose-Capillary: 88 mg/dL (ref 70–99)

## 2020-08-13 MED ORDER — INFLUENZA VAC A&B SA ADJ QUAD 0.5 ML IM PRSY
0.5000 mL | PREFILLED_SYRINGE | INTRAMUSCULAR | Status: AC
  Administered 2020-08-14: 0.5 mL via INTRAMUSCULAR
  Filled 2020-08-13: qty 0.5

## 2020-08-13 MED ORDER — ENOXAPARIN SODIUM 30 MG/0.3ML ~~LOC~~ SOLN
30.0000 mg | Freq: Two times a day (BID) | SUBCUTANEOUS | Status: DC
Start: 2020-08-13 — End: 2020-08-20
  Administered 2020-08-13 – 2020-08-20 (×15): 30 mg via SUBCUTANEOUS
  Filled 2020-08-13 (×14): qty 0.3

## 2020-08-13 MED ORDER — TAMSULOSIN HCL 0.4 MG PO CAPS
0.4000 mg | ORAL_CAPSULE | Freq: Every day | ORAL | Status: DC
  Administered 2020-08-13 – 2020-08-23 (×11): 0.4 mg via ORAL
  Filled 2020-08-13 (×11): qty 1

## 2020-08-13 MED ORDER — DEXTROSE 50 % IV SOLN
12.5000 g | INTRAVENOUS | Status: AC
  Administered 2020-08-13: 12.5 g via INTRAVENOUS

## 2020-08-13 MED ORDER — DEXTROSE 50 % IV SOLN
INTRAVENOUS | Status: AC
  Filled 2020-08-13: qty 50

## 2020-08-13 NOTE — Progress Notes (Signed)
Patient remains NPO except for medication and blood sugar dropped 66 this evening asymptomatic. Hypoglycemia protocol used and 12.5 gm D50 given IV.When blood sugar rechecked up to 110.Will continue to monitor blood sugar every four hours and as needed.
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
## 2020-08-14 ENCOUNTER — Inpatient Hospital Stay (HOSPITAL_COMMUNITY): Payer: PPO

## 2020-08-14 LAB — GLUCOSE, CAPILLARY
Glucose-Capillary: 70 mg/dL (ref 70–99)
Glucose-Capillary: 80 mg/dL (ref 70–99)
Glucose-Capillary: 83 mg/dL (ref 70–99)
Glucose-Capillary: 84 mg/dL (ref 70–99)
Glucose-Capillary: 95 mg/dL (ref 70–99)
Glucose-Capillary: 97 mg/dL (ref 70–99)

## 2020-08-14 LAB — CBC
HCT: 30.8 % — ABNORMAL LOW (ref 36.0–46.0)
Hemoglobin: 9.6 g/dL — ABNORMAL LOW (ref 12.0–15.0)
MCH: 27.7 pg (ref 26.0–34.0)
MCHC: 31.2 g/dL (ref 30.0–36.0)
MCV: 88.8 fL (ref 80.0–100.0)
Platelets: 308 10*3/uL (ref 150–400)
RBC: 3.47 MIL/uL — ABNORMAL LOW (ref 3.87–5.11)
RDW: 15.7 % — ABNORMAL HIGH (ref 11.5–15.5)
WBC: 13.5 10*3/uL — ABNORMAL HIGH (ref 4.0–10.5)
nRBC: 0 % (ref 0.0–0.2)

## 2020-08-14 LAB — BASIC METABOLIC PANEL
Anion gap: 9 (ref 5–15)
BUN: 11 mg/dL (ref 8–23)
CO2: 25 mmol/L (ref 22–32)
Calcium: 8.2 mg/dL — ABNORMAL LOW (ref 8.9–10.3)
Chloride: 102 mmol/L (ref 98–111)
Creatinine, Ser: 0.63 mg/dL (ref 0.44–1.00)
GFR calc Af Amer: >60 mL/min (ref >60)
GFR calc non Af Amer: >60 mL/min (ref >60)
Glucose, Bld: 94 mg/dL (ref 70–99)
Potassium: 3.9 mmol/L (ref 3.5–5.1)
Sodium: 136 mmol/L (ref 135–145)

## 2020-08-14 LAB — MAGNESIUM: Magnesium: 2 mg/dL (ref 1.7–2.4)

## 2020-08-14 LAB — TROPONIN I (HIGH SENSITIVITY): Troponin I (High Sensitivity): 14 ng/L (ref <18)

## 2020-08-14 MED ORDER — FUROSEMIDE 10 MG/ML IJ SOLN
40.0000 mg | Freq: Once | INTRAMUSCULAR | Status: AC
  Administered 2020-08-14: 40 mg via INTRAVENOUS
  Filled 2020-08-14: qty 4

## 2020-08-14 MED ORDER — PANTOPRAZOLE SODIUM 40 MG IV SOLR
40.0000 mg | Freq: Every day | INTRAVENOUS | Status: DC
  Administered 2020-08-14 – 2020-08-15 (×2): 40 mg via INTRAVENOUS
  Filled 2020-08-14 (×3): qty 40

## 2020-08-14 MED ORDER — CALCIUM CARBONATE ANTACID 500 MG PO CHEW
1.0000 | CHEWABLE_TABLET | Freq: Four times a day (QID) | ORAL | Status: DC | PRN
  Administered 2020-08-14 – 2020-08-15 (×2): 200 mg via ORAL
  Filled 2020-08-14 (×3): qty 1

## 2020-08-14 MED ORDER — ACETAMINOPHEN 325 MG PO TABS
650.0000 mg | ORAL_TABLET | Freq: Four times a day (QID) | ORAL | Status: DC
  Administered 2020-08-14 – 2020-08-23 (×31): 650 mg via ORAL
  Filled 2020-08-14 (×35): qty 2

## 2020-08-14 MED ORDER — POLYETHYLENE GLYCOL 3350 17 G PO PACK: 17.0000 g | PACK | Freq: Every day | ORAL | Status: DC | PRN

## 2020-08-14 MED ORDER — MORPHINE SULFATE (PF) 2 MG/ML IV SOLN
2.0000 mg | INTRAVENOUS | Status: DC | PRN
  Administered 2020-08-14 – 2020-08-23 (×5): 2 mg via INTRAVENOUS
  Filled 2020-08-14 (×5): qty 1

## 2020-08-14 MED ORDER — OXYCODONE HCL 5 MG PO TABS
5.0000 mg | ORAL_TABLET | ORAL | Status: DC | PRN
  Administered 2020-08-14 – 2020-08-17 (×5): 10 mg via ORAL
  Administered 2020-08-17 – 2020-08-19 (×3): 5 mg via ORAL
  Administered 2020-08-20 – 2020-08-21 (×3): 10 mg via ORAL
  Administered 2020-08-22: 5 mg via ORAL
  Filled 2020-08-14: qty 1
  Filled 2020-08-14 (×7): qty 2
  Filled 2020-08-14: qty 1
  Filled 2020-08-14: qty 2
  Filled 2020-08-14: qty 1
  Filled 2020-08-14: qty 2
  Filled 2020-08-14: qty 1
  Filled 2020-08-14: qty 2

## 2020-08-14 MED ORDER — METHOCARBAMOL 500 MG PO TABS
500.0000 mg | ORAL_TABLET | Freq: Four times a day (QID) | ORAL | Status: DC
  Administered 2020-08-14 – 2020-08-23 (×33): 500 mg via ORAL
  Filled 2020-08-14 (×36): qty 1
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
## 2020-08-14 NOTE — Progress Notes (Signed)
Pt complaining of 8/10 new onset burning pain in left side of chest. Said that it started after working with therapy and sitting up in bed. PA, Brooke, made aware. Morphine given. See new orders.   Justice Rocher, RN
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
## 2020-08-15 ENCOUNTER — Inpatient Hospital Stay (HOSPITAL_COMMUNITY): Payer: PPO

## 2020-08-15 ENCOUNTER — Encounter (HOSPITAL_COMMUNITY): Payer: Self-pay | Admitting: Orthopedic Surgery

## 2020-08-15 LAB — CBC
HCT: 35.9 % — ABNORMAL LOW (ref 36.0–46.0)
Hemoglobin: 11.3 g/dL — ABNORMAL LOW (ref 12.0–15.0)
MCH: 28 pg (ref 26.0–34.0)
MCHC: 31.5 g/dL (ref 30.0–36.0)
MCV: 88.9 fL (ref 80.0–100.0)
Platelets: 332 10*3/uL (ref 150–400)
RBC: 4.04 MIL/uL (ref 3.87–5.11)
RDW: 16.3 % — ABNORMAL HIGH (ref 11.5–15.5)
WBC: 15.7 10*3/uL — ABNORMAL HIGH (ref 4.0–10.5)
nRBC: 0 % (ref 0.0–0.2)

## 2020-08-15 LAB — BASIC METABOLIC PANEL
Anion gap: 12 (ref 5–15)
BUN: 13 mg/dL (ref 8–23)
CO2: 25 mmol/L (ref 22–32)
Calcium: 8.4 mg/dL — ABNORMAL LOW (ref 8.9–10.3)
Chloride: 100 mmol/L (ref 98–111)
Creatinine, Ser: 0.76 mg/dL (ref 0.44–1.00)
GFR calc Af Amer: >60 mL/min (ref >60)
GFR calc non Af Amer: >60 mL/min (ref >60)
Glucose, Bld: 96 mg/dL (ref 70–99)
Potassium: 4.1 mmol/L (ref 3.5–5.1)
Sodium: 137 mmol/L (ref 135–145)

## 2020-08-15 LAB — GLUCOSE, CAPILLARY
Glucose-Capillary: 93 mg/dL (ref 70–99)
Glucose-Capillary: 99 mg/dL (ref 70–99)
Glucose-Capillary: 92 mg/dL (ref 70–99)
Glucose-Capillary: 94 mg/dL (ref 70–99)
Glucose-Capillary: 87 mg/dL (ref 70–99)

## 2020-08-15 LAB — MAGNESIUM: Magnesium: 1.8 mg/dL (ref 1.7–2.4)

## 2020-08-15 MED ORDER — BOOST / RESOURCE BREEZE PO LIQD CUSTOM
1.0000 | Freq: Three times a day (TID) | ORAL | Status: DC
  Administered 2020-08-18: 1 via ORAL

## 2020-08-15 MED ORDER — IOHEXOL 300 MG/ML  SOLN: 100.0000 mL | Freq: Once | INTRAMUSCULAR | Status: DC | PRN

## 2020-08-15 MED ORDER — IOHEXOL 9 MG/ML PO SOLN
500.0000 mL | ORAL | Status: AC
  Administered 2020-08-15 (×2): 500 mL via ORAL

## 2020-08-15 MED ORDER — IOHEXOL 300 MG/ML  SOLN
100.0000 mL | Freq: Once | INTRAMUSCULAR | Status: AC | PRN
  Administered 2020-08-15: 100 mL via INTRAVENOUS
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
## 2020-08-15 NOTE — Progress Notes (Signed)
Pt to have CT abd/pelvis with contrast. Pt having episodes of nausea. Zofran given this AM. Pt stated "I can't drink this I am going to vomit." Trauma paged. Spoke with PA. Gave PRN phenergan and have Pt attempt to drink contrast again. PRN given. Trauma reached out to this RN again and stated have Pt drink as much as she can.
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
## 2020-08-16 LAB — CBC
HCT: 31.7 % — ABNORMAL LOW (ref 36.0–46.0)
Hemoglobin: 9.6 g/dL — ABNORMAL LOW (ref 12.0–15.0)
MCH: 27.4 pg (ref 26.0–34.0)
MCHC: 30.3 g/dL (ref 30.0–36.0)
MCV: 90.3 fL (ref 80.0–100.0)
Platelets: 344 10*3/uL (ref 150–400)
RBC: 3.51 MIL/uL — ABNORMAL LOW (ref 3.87–5.11)
RDW: 16.6 % — ABNORMAL HIGH (ref 11.5–15.5)
WBC: 13.6 10*3/uL — ABNORMAL HIGH (ref 4.0–10.5)
nRBC: 0 % (ref 0.0–0.2)

## 2020-08-16 LAB — BASIC METABOLIC PANEL
Anion gap: 11 (ref 5–15)
BUN: 12 mg/dL (ref 8–23)
CO2: 25 mmol/L (ref 22–32)
Calcium: 8.2 mg/dL — ABNORMAL LOW (ref 8.9–10.3)
Chloride: 99 mmol/L (ref 98–111)
Creatinine, Ser: 0.62 mg/dL (ref 0.44–1.00)
GFR calc Af Amer: >60 mL/min (ref >60)
GFR calc non Af Amer: >60 mL/min (ref >60)
Glucose, Bld: 101 mg/dL — ABNORMAL HIGH (ref 70–99)
Potassium: 3.3 mmol/L — ABNORMAL LOW (ref 3.5–5.1)
Sodium: 135 mmol/L (ref 135–145)

## 2020-08-16 LAB — GLUCOSE, CAPILLARY
Glucose-Capillary: 76 mg/dL (ref 70–99)
Glucose-Capillary: 84 mg/dL (ref 70–99)
Glucose-Capillary: 88 mg/dL (ref 70–99)
Glucose-Capillary: 89 mg/dL (ref 70–99)
Glucose-Capillary: 95 mg/dL (ref 70–99)
Glucose-Capillary: 98 mg/dL (ref 70–99)

## 2020-08-16 LAB — MAGNESIUM: Magnesium: 1.7 mg/dL (ref 1.7–2.4)

## 2020-08-16 LAB — PREALBUMIN: Prealbumin: 11.8 mg/dL — ABNORMAL LOW (ref 18–38)

## 2020-08-16 MED ORDER — FLUOXETINE HCL 20 MG PO CAPS
80.0000 mg | ORAL_CAPSULE | Freq: Every morning | ORAL | Status: DC
  Administered 2020-08-16 – 2020-08-23 (×7): 80 mg via ORAL
  Filled 2020-08-16 (×10): qty 4

## 2020-08-16 MED ORDER — ENSURE ENLIVE PO LIQD: 237.0000 mL | Freq: Two times a day (BID) | ORAL | Status: DC

## 2020-08-16 MED ORDER — PROSOURCE PLUS PO LIQD
30.0000 mL | Freq: Three times a day (TID) | ORAL | Status: DC
  Administered 2020-08-17 – 2020-08-22 (×6): 30 mL via ORAL
  Filled 2020-08-16 (×5): qty 30

## 2020-08-16 MED ORDER — GLUCERNA SHAKE PO LIQD: 237.0000 mL | Freq: Three times a day (TID) | ORAL | Status: DC

## 2020-08-16 MED ORDER — ALBUTEROL SULFATE HFA 108 (90 BASE) MCG/ACT IN AERS: 2.0000 | INHALATION_SPRAY | Freq: Four times a day (QID) | RESPIRATORY_TRACT | Status: DC | PRN

## 2020-08-16 MED ORDER — MAGNESIUM SULFATE 2 GM/50ML IV SOLN
2.0000 g | Freq: Once | INTRAVENOUS | Status: AC
  Administered 2020-08-16: 2 g via INTRAVENOUS
  Filled 2020-08-16: qty 50

## 2020-08-16 MED ORDER — PANTOPRAZOLE SODIUM 40 MG PO TBEC
40.0000 mg | DELAYED_RELEASE_TABLET | Freq: Every day | ORAL | Status: DC
  Administered 2020-08-16 – 2020-08-23 (×7): 40 mg via ORAL
  Filled 2020-08-16 (×8): qty 1

## 2020-08-16 MED ORDER — POTASSIUM CHLORIDE CRYS ER 10 MEQ PO TBCR
40.0000 meq | EXTENDED_RELEASE_TABLET | Freq: Two times a day (BID) | ORAL | Status: AC
  Administered 2020-08-16: 40 meq via ORAL
  Filled 2020-08-16: qty 4
  Filled 2020-08-16: qty 2
  Filled 2020-08-16: qty 4

## 2020-08-16 MED ORDER — ADULT MULTIVITAMIN W/MINERALS CH
1.0000 | ORAL_TABLET | Freq: Every day | ORAL | Status: DC
  Administered 2020-08-19 – 2020-08-23 (×4): 1 via ORAL
  Filled 2020-08-16 (×7): qty 1

## 2020-08-16 MED ORDER — BUPROPION HCL ER (XL) 150 MG PO TB24
150.0000 mg | ORAL_TABLET | Freq: Every morning | ORAL | Status: DC
  Administered 2020-08-16 – 2020-08-23 (×8): 150 mg via ORAL
  Filled 2020-08-16 (×9): qty 1

## 2020-08-16 MED ORDER — MOMETASONE FURO-FORMOTEROL FUM 100-5 MCG/ACT IN AERO
2.0000 | INHALATION_SPRAY | Freq: Two times a day (BID) | RESPIRATORY_TRACT | Status: DC
  Administered 2020-08-16 – 2020-08-23 (×15): 2 via RESPIRATORY_TRACT
  Filled 2020-08-16: qty 8.8

## 2020-08-16 NOTE — Plan of Care (Signed)
  Problem: Education: Goal: Knowledge of General Education information will improve Description: Including pain rating scale, medication(s)/side effects and non-pharmacologic comfort measures Outcome: Progressing   Problem: Health Behavior/Discharge Planning: Goal: Ability to manage health-related needs will improve Outcome: Progressing   Problem: Clinical Measurements: Goal: Ability to maintain clinical measurements within normal limits will improve Outcome: Progressing Goal: Will remain free from infection Outcome: Progressing Goal: Diagnostic test results will improve Outcome: Progressing Goal: Respiratory complications will improve Outcome: Progressing Goal: Cardiovascular complication will be avoided Outcome: Progressing   Problem: Activity: Goal: Risk for activity intolerance will decrease Outcome: Progressing   Problem: Nutrition: Goal: Adequate nutrition will be maintained Outcome: Progressing   Problem: Coping: Goal: Level of anxiety will decrease Outcome: Progressing   Problem: Elimination: Goal: Will not experience complications related to bowel motility Outcome: Progressing Goal: Will not experience complications related to urinary retention Outcome: Progressing   Problem: Pain Managment: Goal: General experience of comfort will improve Outcome: Progressing   Problem: Safety: Goal: Ability to remain free from injury will improve Outcome: Progressing   Problem: Skin Integrity: Goal: Risk for impaired skin integrity will decrease Outcome: Progressing   Problem: Activity: Goal: Ability to perform activities at highest level will improve Outcome: Progressing Goal: Ability to avoid complications of mobility impairment will improve Outcome: Progressing Goal: Ability to tolerate increased activity will improve Outcome: Progressing Goal: Will remain free from falls Outcome: Progressing   Problem: Respiratory: Goal: Ability to maintain adequate  ventilation will improve Outcome: Progressing   Problem: Tissue Perfusion: Goal: Hemodynamically stable with effective tissue perfusion will improve Outcome: Progressing Goal: Postoperative complications will be avoided or minimized Outcome: Progressing   Problem: Skin Integrity: Goal: Ability to maintain adequate tissue integrity will improve Outcome: Progressing   Problem: Infection: Goal: Risk for infection will decrease (spleen) Outcome: Progressing   Problem: Bowel/Gastric: Goal: Gastrointestinal status for postoperative course will improve Outcome: Progressing Goal: GI tract motility and GI tissue perfusion will improve Outcome: Progressing Goal: Ability to demonstrate the techniques of an individualized bowel program will improve Outcome: Progressing   Problem: Urinary Elimination: Goal: Ability to achieve a regular elimination pattern will improve Outcome: Progressing   Problem: Fluid Volume: Goal: Return to normovolemic state will improve Outcome: Progressing   Problem: Metabolic: Goal: Ability to maintain a metabolic balance will improve Outcome: Progressing   Problem: Education: Goal: Knowledge of the prescribed therapeutic regimen will improve Outcome: Progressing Goal: Knowledge of injury and care (of blunt abdominal trauma) will improve Outcome: Progressing   Problem: Coping: Goal: Exhibits appropriate coping mechanism and reduced anxiety resulting from physical and emotional stress Outcome: Progressing   Problem: Self-Concept: Goal: Ability to acknowledge body changes will improve Outcome: Progressing   

## 2020-08-16 NOTE — Consult Note (Signed)
WOC Nurse Consult Note: Patient receiving care in [facility] 4N06. Reason for Consult: NPWT to abdominal surgical wound Wound type: surgical Pressure Injury POA: Yes/No/NA Measurement: 18.5 cm x 9.2 cm x 4 cm.  There is a 3 cm tunnel at 12 o'clock, a 9.3 cm tunnel at 5 o'clock, and a 14 cm tunnel at 7 o'clock. I discussed the tunnel lengths with Margie Billet, PA via telephone. Wound bed: light pink with adipose tissue Drainage (amount, consistency, odor) serosanginous on existing gauze dressing. Periwound: intact Dressing procedure/placement/frequency: It is not possible to place foam into the extensive tunnels at 5 and 7 o'clock.  I was able to fill the tunnel at 12 with foam, and placed short pieces of black foam into the tunnels at 5 & 7.  Then one solid piece of black foam was placed over the the entire wound bed. An immediate seal was obtained. Patient was given IV morphine early in the NPWT application process.  Val Riles, RN, MSN, CWOCN, CNS-BC, pager [phone]
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
## 2020-08-16 NOTE — Progress Notes (Signed)
Patients foley came out yesterday and she urinated in the bed and with PT today. She also had 100 mL of urine in the suction canister that is amber colored.  Edema is getting worse.
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
## 2020-08-17 ENCOUNTER — Inpatient Hospital Stay (HOSPITAL_COMMUNITY): Payer: PPO

## 2020-08-17 LAB — BASIC METABOLIC PANEL
Anion gap: 12 (ref 5–15)
BUN: 6 mg/dL — ABNORMAL LOW (ref 8–23)
CO2: 25 mmol/L (ref 22–32)
Calcium: 8 mg/dL — ABNORMAL LOW (ref 8.9–10.3)
Chloride: 97 mmol/L — ABNORMAL LOW (ref 98–111)
Creatinine, Ser: 0.64 mg/dL (ref 0.44–1.00)
GFR calc Af Amer: >60 mL/min (ref >60)
GFR calc non Af Amer: >60 mL/min (ref >60)
Glucose, Bld: 94 mg/dL (ref 70–99)
Potassium: 3.2 mmol/L — ABNORMAL LOW (ref 3.5–5.1)
Sodium: 134 mmol/L — ABNORMAL LOW (ref 135–145)

## 2020-08-17 LAB — CBC
HCT: 33.9 % — ABNORMAL LOW (ref 36.0–46.0)
Hemoglobin: 10.9 g/dL — ABNORMAL LOW (ref 12.0–15.0)
MCH: 28.5 pg (ref 26.0–34.0)
MCHC: 32.2 g/dL (ref 30.0–36.0)
MCV: 88.5 fL (ref 80.0–100.0)
Platelets: 407 10*3/uL — ABNORMAL HIGH (ref 150–400)
RBC: 3.83 MIL/uL — ABNORMAL LOW (ref 3.87–5.11)
RDW: 17.2 % — ABNORMAL HIGH (ref 11.5–15.5)
WBC: 16.2 10*3/uL — ABNORMAL HIGH (ref 4.0–10.5)
nRBC: 0 % (ref 0.0–0.2)

## 2020-08-17 LAB — GLUCOSE, CAPILLARY
Glucose-Capillary: 87 mg/dL (ref 70–99)
Glucose-Capillary: 89 mg/dL (ref 70–99)
Glucose-Capillary: 92 mg/dL (ref 70–99)
Glucose-Capillary: 94 mg/dL (ref 70–99)

## 2020-08-17 LAB — MAGNESIUM: Magnesium: 1.8 mg/dL (ref 1.7–2.4)

## 2020-08-17 MED ORDER — POTASSIUM CHLORIDE CRYS ER 20 MEQ PO TBCR
40.0000 meq | EXTENDED_RELEASE_TABLET | Freq: Two times a day (BID) | ORAL | Status: AC
  Administered 2020-08-17 – 2020-08-18 (×3): 40 meq via ORAL
  Filled 2020-08-17 (×3): qty 2

## 2020-08-17 MED ORDER — FUROSEMIDE 20 MG PO TABS
20.0000 mg | ORAL_TABLET | Freq: Once | ORAL | Status: AC
  Administered 2020-08-17: 20 mg via ORAL
  Filled 2020-08-17: qty 1
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
## 2020-08-17 NOTE — [place] FL2 (Signed)
MEDICAID FL2 LEVEL OF CARE SCREENING TOOL     IDENTIFICATION  Patient Name: Lori Cooper Birthdate: October 10, 1956 Sex: female Admission Date (Current Location): 08/08/2020  [facility] and [place] Number:  Herbalist and Address:  [facility]. [facility], [place] [address] [place], [place] [address]      Provider Number: [id number]  Attending Physician Name and Address:  Md, Trauma, MD  Relative Name and Phone Number:  Deidra Spease [phone]    Current Level of Care: Hospital Recommended Level of Care: [place] Prior Approval Number:    Date Approved/Denied:   PASRR Number:    Discharge Plan: Home    Current Diagnoses: Patient Active Problem List   Diagnosis Date Noted  . Left ulnar fracture 08/11/2020  . Closed fracture of left olecranon process 08/11/2020  . Traumatic injury of small intestine 08/08/2020  . History of pulmonary embolus (PE) 08/12/2018  . Controlled substance agreement signed 08/12/2018  . Benzodiazepine dependence ([place]) 08/12/2018  . Positive colorectal cancer screening using Cologuard test 07/13/2018  . DOE (dyspnea on exertion) 03/10/2018  . Upper airway cough syndrome 03/10/2018  . Metabolic syndrome 35/36/1443  . Arthritis 07/16/2015  . Depression 12/21/2014  . Supraumbilical hernia  15/40/0867  . Incisional hernias x 3 s/p laparoscopic repair with mesh 12/31/2016 04/02/2014  . Personal history of colonic polyps 04/02/2014  . Morbid obesity due to excess calories ([place]) complicated by hyperlipidemia/ preDM 02/13/2014  . Prediabetes 09/21/2013  . Vitamin D deficiency 06/23/2013  . HLD (hyperlipidemia) 02/20/2013  . GERD (gastroesophageal reflux disease) 02/20/2013  . Generalized anxiety disorder 02/20/2013  . Restless legs 02/20/2013    Orientation RESPIRATION BLADDER Height & Weight     Self, Time, Situation, Place  O2 Continent Weight: 233 lb 14.5 oz (106.1 kg) Height:  5\' 5"  (165.1  cm)  BEHAVIORAL SYMPTOMS/MOOD NEUROLOGICAL BOWEL NUTRITION STATUS      Continent Diet (liquid)  AMBULATORY STATUS COMMUNICATION OF NEEDS Skin   Limited Assist Verbally Surgical wounds                       Personal Care Assistance Level of Assistance  Bathing, Dressing Bathing Assistance: Limited assistance   Dressing Assistance: Limited assistance     Functional Limitations Info             SPECIAL CARE FACTORS FREQUENCY  PT (By licensed PT), OT (By licensed OT)     PT Frequency: 5x weekly OT Frequency: 5x weekly            Contractures Contractures Info: Not present    Additional Factors Info  Code Status, Allergies Code Status Info: full code Allergies Info: NKDA           Current Medications (08/17/2020):  This is the current hospital active medication list Current Facility-Administered Medications  Medication Dose Route Frequency Provider Last Rate Last Admin  . (feeding supplement) PROSource Plus liquid 30 mL  30 mL Oral TID with meals Georganna Skeans, MD      . 0.9 %  sodium chloride infusion (Manually program via Guardrails IV Fluids)   Intravenous Once Ainsley Spinner, PA-C      . 0.9 % NaCl with KCl 20 mEq/ L  infusion   Intravenous Continuous Meuth, Brooke A, PA-C 10 mL/hr at 08/16/20 1929 Rate Change at 08/16/20 1929  . acetaminophen (TYLENOL) tablet 650 mg  650 mg Oral Q6H Meuth, Brooke A, PA-C   650 mg  at 08/17/20 0555  . albuterol (VENTOLIN HFA) 108 (90 Base) MCG/ACT inhaler 2 puff  2 puff Inhalation Q6H PRN Meuth, Brooke A, PA-C      . bethanechol (URECHOLINE) tablet 25 mg  25 mg Oral TID Georganna Skeans, MD   25 mg at 08/16/20 2143  . buPROPion (WELLBUTRIN XL) 24 hr tablet 150 mg  150 mg Oral q morning - 10a Meuth, Brooke A, PA-C   150 mg at 08/17/20 0915  . calcium carbonate (TUMS - dosed in mg elemental calcium) chewable tablet 200 mg of elemental calcium  1 tablet Oral QID PRN Meuth, Brooke A, PA-C   200 mg of elemental calcium at 08/15/20 0845   . Chlorhexidine Gluconate Cloth 2 % PADS 6 each  6 each Topical Daily Ainsley Spinner, PA-C   6 each at 08/16/20 1031  . docusate sodium (COLACE) capsule 100 mg  100 mg Oral BID Ainsley Spinner, PA-C   100 mg at 08/16/20 2143  . enoxaparin (LOVENOX) injection 30 mg  30 mg Subcutaneous Q12H Georganna Skeans, MD   30 mg at 08/16/20 2144  . feeding supplement (BOOST / RESOURCE BREEZE) liquid 1 Container  1 Container Oral TID BM Georganna Skeans, MD      . FLUoxetine (PROZAC) capsule 80 mg  80 mg Oral q morning - 10a Meuth, Brooke A, PA-C   80 mg at 08/16/20 1122  . insulin aspart (novoLOG) injection 0-15 Units  0-15 Units Subcutaneous Q4H Ainsley Spinner, PA-C   2 Units at 08/10/20 1523  . iohexol (OMNIPAQUE) 300 MG/ML solution 100 mL  100 mL Intravenous Once PRN Meuth, Brooke A, PA-C      . MEDLINE mouth rinse  15 mL Mouth Rinse BID Ainsley Spinner, PA-C   15 mL at 08/16/20 2144  . methocarbamol (ROBAXIN) tablet 500 mg  500 mg Oral Q6H Meuth, Brooke A, PA-C   500 mg at 08/17/20 0914  . mometasone-formoterol (DULERA) 100-5 MCG/ACT inhaler 2 puff  2 puff Inhalation BID Meuth, Brooke A, PA-C   2 puff at 08/17/20 0829  . morphine 2 MG/ML injection 2 mg  2 mg Intravenous Q3H PRN Meuth, Brooke A, PA-C   2 mg at 08/16/20 1028  . multivitamin with minerals tablet 1 tablet  1 tablet Oral Daily Georganna Skeans, MD      . ondansetron (ZOFRAN-ODT) disintegrating tablet 4 mg  4 mg Oral Q6H PRN Ainsley Spinner, PA-C   4 mg at 08/12/20 1105   Or  . ondansetron (ZOFRAN) injection 4 mg  4 mg Intravenous Q6H PRN Ainsley Spinner, PA-C   4 mg at 08/15/20 0845  . oxyCODONE (Oxy IR/ROXICODONE) immediate release tablet 5-10 mg  5-10 mg Oral Q4H PRN Meuth, Brooke A, PA-C   5 mg at 08/17/20 0911  . pantoprazole (PROTONIX) EC tablet 40 mg  40 mg Oral Daily Meuth, Brooke A, PA-C   40 mg at 08/16/20 1122  . polyethylene glycol (MIRALAX / GLYCOLAX) packet 17 g  17 g Oral Daily PRN Meuth, Brooke A, PA-C      . potassium chloride (KLOR-CON) CR tablet 40  mEq  40 mEq Oral BID Rozann Lesches, RPH   40 mEq at 08/16/20 2143  . promethazine (PHENERGAN) injection 12.5 mg  12.5 mg Intravenous Q6H PRN Ileana Roup, MD   12.5 mg at 08/15/20 1114  . scopolamine (TRANSDERM-SCOP) 1 MG/3DAYS 1.5 mg  1 patch Transdermal Q72H PRN Ainsley Spinner, PA-C   1.5 mg at 08/12/20 1930  .  tamsulosin (FLOMAX) capsule 0.4 mg  0.4 mg Oral QPC breakfast Georganna Skeans, MD   0.4 mg at 08/17/20 4656     Discharge Medications: Please see discharge summary for a list of discharge medications.  Relevant Imaging Results:  Relevant Lab Results:   Additional [place], LCSW

## 2020-08-18 ENCOUNTER — Inpatient Hospital Stay (HOSPITAL_COMMUNITY): Payer: PPO

## 2020-08-18 LAB — GLUCOSE, CAPILLARY
Glucose-Capillary: 101 mg/dL — ABNORMAL HIGH (ref 70–99)
Glucose-Capillary: 83 mg/dL (ref 70–99)
Glucose-Capillary: 87 mg/dL (ref 70–99)
Glucose-Capillary: 90 mg/dL (ref 70–99)
Glucose-Capillary: 95 mg/dL (ref 70–99)
Glucose-Capillary: 97 mg/dL (ref 70–99)

## 2020-08-18 LAB — URINALYSIS, ROUTINE W REFLEX MICROSCOPIC
Bilirubin Urine: NEGATIVE
Glucose, UA: NEGATIVE mg/dL
Ketones, ur: 20 mg/dL — AB
Nitrite: NEGATIVE
Protein, ur: NEGATIVE mg/dL
Specific Gravity, Urine: 1.006 (ref 1.005–1.030)
pH: 6 (ref 5.0–8.0)

## 2020-08-18 LAB — BASIC METABOLIC PANEL
Anion gap: 12 (ref 5–15)
BUN: 6 mg/dL — ABNORMAL LOW (ref 8–23)
CO2: 25 mmol/L (ref 22–32)
Calcium: 7.8 mg/dL — ABNORMAL LOW (ref 8.9–10.3)
Chloride: 97 mmol/L — ABNORMAL LOW (ref 98–111)
Creatinine, Ser: 0.64 mg/dL (ref 0.44–1.00)
GFR calc Af Amer: >60 mL/min (ref >60)
GFR calc non Af Amer: >60 mL/min (ref >60)
Glucose, Bld: 90 mg/dL (ref 70–99)
Potassium: 3.3 mmol/L — ABNORMAL LOW (ref 3.5–5.1)
Sodium: 134 mmol/L — ABNORMAL LOW (ref 135–145)

## 2020-08-18 MED ORDER — PANCRELIPASE (LIP-PROT-AMYL) 10440-39150 UNITS PO TABS
20880.0000 [IU] | ORAL_TABLET | Freq: Once | ORAL | Status: AC
  Administered 2020-08-18: 20880 [IU]
  Filled 2020-08-18: qty 2

## 2020-08-18 MED ORDER — OSMOLITE 1.5 CAL PO LIQD: 1000.0000 mL | ORAL | Status: DC

## 2020-08-18 MED ORDER — GUAIFENESIN 100 MG/5ML PO SOLN: 5.0000 mL | ORAL | Status: DC | PRN

## 2020-08-18 MED ORDER — FUROSEMIDE 10 MG/ML IJ SOLN
40.0000 mg | Freq: Once | INTRAMUSCULAR | Status: AC
  Administered 2020-08-18: 40 mg via INTRAVENOUS
  Filled 2020-08-18: qty 4

## 2020-08-18 MED ORDER — GLUCERNA SHAKE PO LIQD
237.0000 mL | Freq: Three times a day (TID) | ORAL | Status: DC
  Administered 2020-08-18 – 2020-08-23 (×13): 237 mL via ORAL

## 2020-08-18 MED ORDER — SODIUM BICARBONATE 650 MG PO TABS
650.0000 mg | ORAL_TABLET | Freq: Once | ORAL | Status: AC
  Administered 2020-08-18: 650 mg
  Filled 2020-08-18: qty 1

## 2020-08-18 NOTE — Progress Notes (Signed)
This nurse was reported from previous nurse that medication were given through small bore NG tube. However, when going to give schedule medication patient stated that she could not take medication by mouth and when trying to flush the small bore NG tube it would not flush.   MD wakefield was made aware of patient not being able to take medication and given orders to hold all P.O. medications until able to assess in the morning.    Sharin Mons, RN
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
## 2020-08-19 ENCOUNTER — Inpatient Hospital Stay (HOSPITAL_COMMUNITY): Payer: PPO

## 2020-08-19 LAB — BASIC METABOLIC PANEL
Anion gap: 12 (ref 5–15)
BUN: 7 mg/dL — ABNORMAL LOW (ref 8–23)
CO2: 26 mmol/L (ref 22–32)
Calcium: 8 mg/dL — ABNORMAL LOW (ref 8.9–10.3)
Chloride: 96 mmol/L — ABNORMAL LOW (ref 98–111)
Creatinine, Ser: 0.62 mg/dL (ref 0.44–1.00)
GFR calc Af Amer: >60 mL/min (ref >60)
GFR calc non Af Amer: >60 mL/min (ref >60)
Glucose, Bld: 97 mg/dL (ref 70–99)
Potassium: 3.5 mmol/L (ref 3.5–5.1)
Sodium: 134 mmol/L — ABNORMAL LOW (ref 135–145)

## 2020-08-19 LAB — MAGNESIUM: Magnesium: 1.8 mg/dL (ref 1.7–2.4)

## 2020-08-19 LAB — CBC
HCT: 31 % — ABNORMAL LOW (ref 36.0–46.0)
Hemoglobin: 10.1 g/dL — ABNORMAL LOW (ref 12.0–15.0)
MCH: 29 pg (ref 26.0–34.0)
MCHC: 32.6 g/dL (ref 30.0–36.0)
MCV: 89.1 fL (ref 80.0–100.0)
Platelets: 393 10*3/uL (ref 150–400)
RBC: 3.48 MIL/uL — ABNORMAL LOW (ref 3.87–5.11)
RDW: 18.1 % — ABNORMAL HIGH (ref 11.5–15.5)
WBC: 15.2 10*3/uL — ABNORMAL HIGH (ref 4.0–10.5)
nRBC: 0 % (ref 0.0–0.2)

## 2020-08-19 LAB — GLUCOSE, CAPILLARY
Glucose-Capillary: 108 mg/dL — ABNORMAL HIGH (ref 70–99)
Glucose-Capillary: 118 mg/dL — ABNORMAL HIGH (ref 70–99)
Glucose-Capillary: 122 mg/dL — ABNORMAL HIGH (ref 70–99)
Glucose-Capillary: 130 mg/dL — ABNORMAL HIGH (ref 70–99)
Glucose-Capillary: 96 mg/dL (ref 70–99)
Glucose-Capillary: 97 mg/dL (ref 70–99)
Glucose-Capillary: 99 mg/dL (ref 70–99)

## 2020-08-19 MED ORDER — POTASSIUM CHLORIDE CRYS ER 20 MEQ PO TBCR
40.0000 meq | EXTENDED_RELEASE_TABLET | Freq: Two times a day (BID) | ORAL | Status: AC
  Administered 2020-08-19 (×2): 40 meq via ORAL
  Filled 2020-08-19 (×2): qty 2

## 2020-08-19 MED ORDER — FUROSEMIDE 10 MG/ML IJ SOLN
20.0000 mg | Freq: Once | INTRAMUSCULAR | Status: AC
  Administered 2020-08-19: 20 mg via INTRAVENOUS
  Filled 2020-08-19: qty 2

## 2020-08-19 NOTE — Consult Note (Signed)
[place] Nurse wound follow up Patient receiving care in [facility] 4N06.  PA, Ferdinand Cava, at bedside for wound assessment. Wound type: abdominal surgical Measurement: Tunnel at 12 o'clock measures 2.1 cm, the tunnel at 5 o'clock measures 3.8 cm, and the tunnel at 7 o'clock measures 6.1 cm. Wound bed:100% red granulation tissue Drainage (amount, consistency, odor) tan in VAC cannister Periwound: intact Dressing procedure/placement/frequency: Each tunnel filled with foam strips, then foam placed over entire wound bed. Immediate seal obtained. Patient medicated prior to initiation of dressing change. Val Riles, RN, MSN, CWOCN, CNS-BC, pager [phone]

## 2020-08-19 NOTE — Progress Notes (Signed)
2230- When administering patients medication pt started gagging on one of the pills. This nurse talked with the patient about crushing the rest of her pills. Patient was in agreement with this medication administration.   5 minutes after giving medication pt vomited.Patient told this nurse that she has a hard time taking pill medication. This nurse also gave patient prn nausea medication (see mar). Will continue to monitor.     Sharin Mons, RN

## 2020-08-19 NOTE — Progress Notes (Signed)
430-Patient was able to take 0400 pill medications whole. This time patient sat at the edge of bed. Pt stated that she needed to sit at the edge of bed sitting up so when she takes pills she can throw head back. This process was successful.  Sharin Mons, RN
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
## 2020-08-20 ENCOUNTER — Inpatient Hospital Stay (HOSPITAL_COMMUNITY): Payer: PPO

## 2020-08-20 LAB — GLUCOSE, CAPILLARY
Glucose-Capillary: 95 mg/dL (ref 70–99)
Glucose-Capillary: 102 mg/dL — ABNORMAL HIGH (ref 70–99)
Glucose-Capillary: 97 mg/dL (ref 70–99)
Glucose-Capillary: 104 mg/dL — ABNORMAL HIGH (ref 70–99)
Glucose-Capillary: 105 mg/dL — ABNORMAL HIGH (ref 70–99)

## 2020-08-20 LAB — BASIC METABOLIC PANEL
Anion gap: 9 (ref 5–15)
BUN: 8 mg/dL (ref 8–23)
CO2: 29 mmol/L (ref 22–32)
Calcium: 8.2 mg/dL — ABNORMAL LOW (ref 8.9–10.3)
Chloride: 98 mmol/L (ref 98–111)
Creatinine, Ser: 0.59 mg/dL (ref 0.44–1.00)
GFR calc Af Amer: >60 mL/min (ref >60)
GFR calc non Af Amer: >60 mL/min (ref >60)
Glucose, Bld: 107 mg/dL — ABNORMAL HIGH (ref 70–99)
Potassium: 3.7 mmol/L (ref 3.5–5.1)
Sodium: 136 mmol/L (ref 135–145)

## 2020-08-20 LAB — CBC
HCT: 30.7 % — ABNORMAL LOW (ref 36.0–46.0)
Hemoglobin: 9.7 g/dL — ABNORMAL LOW (ref 12.0–15.0)
MCH: 27.9 pg (ref 26.0–34.0)
MCHC: 31.6 g/dL (ref 30.0–36.0)
MCV: 88.2 fL (ref 80.0–100.0)
Platelets: 411 10*3/uL — ABNORMAL HIGH (ref 150–400)
RBC: 3.48 MIL/uL — ABNORMAL LOW (ref 3.87–5.11)
RDW: 18.6 % — ABNORMAL HIGH (ref 11.5–15.5)
WBC: 15.4 10*3/uL — ABNORMAL HIGH (ref 4.0–10.5)
nRBC: 0 % (ref 0.0–0.2)

## 2020-08-20 LAB — URINE CULTURE: Culture: >=100000 — AB

## 2020-08-20 MED ORDER — ALPRAZOLAM 0.25 MG PO TABS: 0.2500 mg | ORAL_TABLET | Freq: Two times a day (BID) | ORAL | Status: DC | PRN

## 2020-08-20 MED ORDER — BUSPIRONE HCL 5 MG PO TABS
5.0000 mg | ORAL_TABLET | Freq: Three times a day (TID) | ORAL | Status: DC
  Administered 2020-08-20 – 2020-08-23 (×9): 5 mg via ORAL
  Filled 2020-08-20 (×9): qty 1

## 2020-08-20 MED ORDER — APIXABAN 5 MG PO TABS
5.0000 mg | ORAL_TABLET | Freq: Two times a day (BID) | ORAL | Status: DC
  Administered 2020-08-20 – 2020-08-23 (×7): 5 mg via ORAL
  Filled 2020-08-20 (×7): qty 1
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
## 2020-08-20 NOTE — Care Management Important Message (Signed)
Important Message  Patient Details  Name: Lori Cooper MRN: [id number] Date of Birth: [birth year]   Medicare Important Message Given:  Yes     Orbie Pyo 08/20/2020, 4:36 PM

## 2020-08-20 NOTE — TOC Progression Note (Signed)
Transition of Care [facility]) - Progression Note    Patient Details  Name: Lori Cooper MRN: [id number] Date of Birth: 1956-01-12  [facility] CM/SW Contact  Oren Section Cleta Alberts, RN Phone Number: 08/20/2020, 9:55 AM  Clinical Narrative:  Damaris Schooner with Debbie at [facility]  in admissions; patient has been accepted for admission to facility upon medical stability.  Will need Covid test prior to dc.  Will follow to coordinate transfer     Expected Discharge Plan: [place] Barriers to Discharge: Continued Medical Work up  Expected Discharge Plan and Services Expected Discharge Plan: [place]   Discharge Planning Services: CM Consult   Living arrangements for the past 2 months: Single Family Home                                       Social Determinants of Health (SDOH) Interventions    Readmission Risk Interventions No flowsheet data found.  Reinaldo Raddle, RN, BSN  Trauma/Neuro ICU Case Manager [phone]

## 2020-08-21 LAB — GLUCOSE, CAPILLARY
Glucose-Capillary: 108 mg/dL — ABNORMAL HIGH (ref 70–99)
Glucose-Capillary: 89 mg/dL (ref 70–99)
Glucose-Capillary: 103 mg/dL — ABNORMAL HIGH (ref 70–99)
Glucose-Capillary: 108 mg/dL — ABNORMAL HIGH (ref 70–99)
Glucose-Capillary: 122 mg/dL — ABNORMAL HIGH (ref 70–99)
Glucose-Capillary: 116 mg/dL — ABNORMAL HIGH (ref 70–99)
Glucose-Capillary: 106 mg/dL — ABNORMAL HIGH (ref 70–99)

## 2020-08-21 LAB — SARS CORONAVIRUS 2 BY RT PCR (HOSPITAL ORDER, PERFORMED IN ~~LOC~~ HOSPITAL LAB): SARS Coronavirus 2: NEGATIVE

## 2020-08-21 MED ORDER — CIPROFLOXACIN IN D5W 400 MG/200ML IV SOLN
400.0000 mg | Freq: Two times a day (BID) | INTRAVENOUS | Status: DC
  Administered 2020-08-21 – 2020-08-23 (×4): 400 mg via INTRAVENOUS
  Filled 2020-08-21 (×6): qty 200

## 2020-08-21 MED ORDER — SILVER NITRATE-POT NITRATE 75-25 % EX MISC
5.0000 | CUTANEOUS | Status: AC
  Administered 2020-08-21: 5 via TOPICAL
  Filled 2020-08-21: qty 5
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
## 2020-08-21 NOTE — Op Note (Addendum)
DATE OF PROCEDURE:  08/10/2020  PREOPERATIVE DIAGNOSES:  1.  LEFT OLECRANON FRACTURE. 2.  LEFT ULNAR SHAFT FRACTURE  POSTOPERATIVE DIAGNOSES:   1.  LEFT OLECRANON FRACTURE. 2.  LEFT ULNAR SHAFT FRACTURE  PROCEDURES:  1. OPEN REDUCTION INTERNAL FIXATION OF LEFT OLECRANON FRACTURE. 2. OPEN REDUCTION INTERNAL FIXATION OF LEFT ULNA SHAFT.  SURGEON:  Altamese Campbell, MD  ASSISTANT:  Ainsley Spinner PA-C  ANESTHESIA:  General.  COMPLICATIONS:  None.  TOURNIQUET:  None.  DISPOSITION:  To PACU.  CONDITION:  Stable.  INDICATIONS FOR PROCEDURE:  Quiana Cobaugh is a pleasant 64 y.o. who has sustained an intraarticular fracture of the elbow with disruption of the extensor mechanism. I discussed with patient the risks and benefits of surgical repair, including the potential for failure to obtain an anatomic reconstruction, nerve injury, vessel injury, arthritis, loss of motion, DVT, PE, symptomatic hardware, particularly given the position of the fixation and multiple others. The patient acknowledged these risks and provided consent to proceed.   BRIEF SUMMARY OF PROCEDURE:  After administration of preoperative antibiotics, the patient was taken to the operating room where general anesthesia was induced. The right arm over a bone foam.  No tourniquet was used during the procedure.  A chlorhexidine scrub and then a  Betadine scrub and paint were used.  A timeout was held after standard draping, and then a slightly off midline incision made.  Dissection was carried down through subcutaneous tissue to the fracture site and disrupted extensor. Hematoma was evacuated, and the fracture ends were cleaned thoroughly, and then a stepwise attempt was made at reconstruction.  I was able to elevate the articular pieces and pin these provisionally, followed by provisional fixation of the dorsal or outside  Cortex and provisional application of the plate.  I brought in the C-arm to gain a better look at this,  and proceeded with internal fixation using an Acumed olecranon plate using a combination of standard and locked fixation with 2.7 and 3.5 mm screws. We then turned to the ulna, which was approached through a completely separate incision along the subcutaneous border using an 8 cm incision.  The periosteum was left intact to the extent possible and the fracture site exposed.  The bone ends were delivered and cleaned with a curette and lavage.  I then applied a 6-hole plate using the Acumed system, securing a screw on either side of the fracture site while the fracture was interdigitated and compressed.  A C-arm confirmed alignment and plate position on orthogonal views.  We then proceeded with additional fixation such that we had 6 cortices of purchase on either side of the fracture.  C-arm was once more  brought in, and AP and lateral confirmed appropriate reduction and hardware placement.  Wound closure, which was done with a 0 Vicryl, 2-0 Vicryl and a 2-0 nylon.  A sterile bulky dressing was applied.  The patient awakened from anesthesia and transported  to PACU in stable condition.  PROGNOSIS: No formal range of motion restrictions but will refrain from active extension against resistance.  There is increased risk for arthritis given the intraarticular injury, as well as loss of motion and symptomatic hardware which has been discussed.   Altamese Pioneer, MD [facility], [facility] [phone]

## 2020-08-21 NOTE — Consult Note (Addendum)
[place] Nurse wound follow up Patient receiving care in [facility] 4N[address] PA, Lori Cooper, at bedside. Pt premedicated before initiation of  Wound type: abdominal surgical Measurement: Tunnel at 5 o'clock measures 10.5 cm Tunnel at 7 o'clock measures 10 cm Unable to measure tunnel at 12 o'clock Wound bed: 100% red granulation tissue Drainage (amount, consistency, odor): bloody Dressing procedure/placement/frequency:  4 pieces of foam dressing removed without incident. Once removed bleeding at the 12 o'clock tunnel was noticed and pressure was held but did not stop. PA Claiborne Billings was called into the room. Kelly held pressure at the bleeding area, blood was suctioned from the area and surgicell was applied with gauze. Kelly ordered silver nitrate from the pharmacy and will apply when received. NPWT is being discontinued and wound will be dressed with moistened saline gauze, covered with ABD pads and secured with tape.  [place] team will no longer follow.  Please re-consult if needed.  Cathlean Marseilles Tamala Julian, MSN, RN, [place], Lysle Pearl, [facility] Wound Treatment Associate Pager [phone]

## 2020-08-21 NOTE — TOC Progression Note (Signed)
Transition of Care [facility]) - Progression Note    Patient Details  Name: Lori Cooper MRN: [id number] Date of Birth: 10-31-1956  Transition of Care [facility]) CM/SW Contact  Ella Bodo, RN Phone Number: 08/21/2020, 2:58 PM  Clinical Narrative: Discussed SNF with patient; she is agreeable to dc to [facility] where her husband is currently receiving rehab.  Will initiate insurance authorization, as pt may dc tomorrow.       Expected Discharge Plan: [place] Barriers to Discharge: Continued Medical Work up  Expected Discharge Plan and Services Expected Discharge Plan: [place]   Discharge Planning Services: CM Consult   Living arrangements for the past 2 months: Single Family Home                                       Social Determinants of Health (SDOH) Interventions    Readmission Risk Interventions No flowsheet data found.  Reinaldo Raddle, RN, BSN  Trauma/Neuro ICU Case Manager [phone]
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
## 2020-08-21 NOTE — Plan of Care (Signed)
  Problem: Education: Goal: Knowledge of General Education information will improve Description: Including pain rating scale, medication(s)/side effects and non-pharmacologic comfort measures Outcome: Progressing   Problem: Clinical Measurements: Goal: Ability to maintain clinical measurements within normal limits will improve Outcome: Progressing   Problem: Clinical Measurements: Goal: Diagnostic test results will improve Outcome: Progressing   Problem: Clinical Measurements: Goal: Will remain free from infection Outcome: Progressing   Problem: Clinical Measurements: Goal: Respiratory complications will improve Outcome: Progressing   Problem: Clinical Measurements: Goal: Cardiovascular complication will be avoided Outcome: Progressing

## 2020-08-22 LAB — GLUCOSE, CAPILLARY: Glucose-Capillary: 103 mg/dL — ABNORMAL HIGH (ref 70–99)

## 2020-08-22 MED ORDER — POLYETHYLENE GLYCOL 3350 17 G PO PACK: 17.0000 g | PACK | Freq: Every day | ORAL | 0 refills | Status: DC | PRN

## 2020-08-22 MED ORDER — OXYCODONE HCL 5 MG PO TABS: 5.0000 mg | ORAL_TABLET | ORAL | 0 refills | Status: DC | PRN

## 2020-08-22 MED ORDER — CIPROFLOXACIN HCL 500 MG PO TABS: 500.0000 mg | ORAL_TABLET | Freq: Two times a day (BID) | ORAL | 0 refills | Status: AC

## 2020-08-22 MED ORDER — METHOCARBAMOL 500 MG PO TABS
500.0000 mg | ORAL_TABLET | Freq: Four times a day (QID) | ORAL | 0 refills | Status: DC | PRN
Start: 2020-08-22 — End: 2020-12-03

## 2020-08-22 NOTE — TOC Progression Note (Addendum)
Transition of Care [facility]) - Progression Note    Patient Details  Name: Lori Cooper MRN: [id number] Date of Birth: Mar 21, 1956  Transition of Care [facility]) CM/SW Contact  Ella Bodo, RN Phone Number: 08/22/2020, 4:02 PM  Clinical Narrative: Patient medically stable for discharge to skilled nursing facility today, per medical provider.  Notified [employer] to initiate insurance authorization for SNF stay and nonemergent ambulance transport; spoke with [place].  Will provide updates as available.  Addendum: 1:52 PM called Crystal at [employer] for update on authorization for SNF; she states cases in nurse review, and she does not know when we will have an answer.  Will follow.  Addendum 4:07 PM spoke with Jackelyn Poling at [facility]; she states that patient will need to discharge tomorrow to facility pending insurance authorization, as it is too late in the day for facility to admit.  Notify provider of delay of discharge.  Will follow with updates.  Expected Discharge Plan: Skilled Nursing Facility Barriers to Discharge: Insurance Authorization  Expected Discharge Plan and Services Expected Discharge Plan: [name]   Discharge Planning Services: CM Consult Post Acute Care Choice: [place] Living arrangements for the past 2 months: Single Family Home Expected Discharge Date: 08/22/20                                     Social Determinants of Health (SDOH) Interventions    Readmission Risk Interventions No flowsheet data found.  Reinaldo Raddle, RN, BSN  Trauma/Neuro ICU Case Manager [phone]
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
## 2020-08-22 NOTE — Plan of Care (Signed)
  Problem: Urinary Elimination: Goal: Ability to achieve a regular elimination pattern will improve Outcome: Progressing   Problem: Fluid Volume: Goal: Return to normovolemic state will improve Outcome: Progressing   Problem: Skin Integrity: Goal: Risk for impaired skin integrity will decrease Outcome: Progressing   Problem: Activity: Goal: Ability to perform activities at highest level will improve Outcome: Progressing

## 2020-08-23 ENCOUNTER — Inpatient Hospital Stay (HOSPITAL_COMMUNITY): Payer: PPO

## 2020-08-23 DIAGNOSIS — F329 Major depressive disorder, single episode, unspecified: Secondary | ICD-10-CM | POA: Diagnosis not present

## 2020-08-23 DIAGNOSIS — S2231XA Fracture of one rib, right side, initial encounter for closed fracture: Secondary | ICD-10-CM | POA: Diagnosis not present

## 2020-08-23 DIAGNOSIS — S2231XS Fracture of one rib, right side, sequela: Secondary | ICD-10-CM | POA: Diagnosis not present

## 2020-08-23 DIAGNOSIS — R339 Retention of urine, unspecified: Secondary | ICD-10-CM | POA: Diagnosis not present

## 2020-08-23 DIAGNOSIS — S52032D Displaced fracture of olecranon process with intraarticular extension of left ulna, subsequent encounter for closed fracture with routine healing: Secondary | ICD-10-CM | POA: Diagnosis not present

## 2020-08-23 DIAGNOSIS — F419 Anxiety disorder, unspecified: Secondary | ICD-10-CM | POA: Diagnosis not present

## 2020-08-23 DIAGNOSIS — S330XXD Traumatic rupture of lumbar intervertebral disc, subsequent encounter: Secondary | ICD-10-CM | POA: Diagnosis not present

## 2020-08-23 DIAGNOSIS — S52222D Displaced transverse fracture of shaft of left ulna, subsequent encounter for closed fracture with routine healing: Secondary | ICD-10-CM | POA: Diagnosis not present

## 2020-08-23 DIAGNOSIS — S52602D Unspecified fracture of lower end of left ulna, subsequent encounter for closed fracture with routine healing: Secondary | ICD-10-CM | POA: Diagnosis not present

## 2020-08-23 DIAGNOSIS — S36031D Moderate laceration of spleen, subsequent encounter: Secondary | ICD-10-CM | POA: Diagnosis not present

## 2020-08-23 DIAGNOSIS — S52202D Unspecified fracture of shaft of left ulna, subsequent encounter for closed fracture with routine healing: Secondary | ICD-10-CM | POA: Diagnosis not present

## 2020-08-23 DIAGNOSIS — J45909 Unspecified asthma, uncomplicated: Secondary | ICD-10-CM | POA: Diagnosis not present

## 2020-08-23 DIAGNOSIS — S36409D Unspecified injury of unspecified part of small intestine, subsequent encounter: Secondary | ICD-10-CM | POA: Diagnosis not present

## 2020-08-23 DIAGNOSIS — M6281 Muscle weakness (generalized): Secondary | ICD-10-CM | POA: Diagnosis not present

## 2020-08-23 DIAGNOSIS — R2681 Unsteadiness on feet: Secondary | ICD-10-CM | POA: Diagnosis not present

## 2020-08-23 DIAGNOSIS — G2581 Restless legs syndrome: Secondary | ICD-10-CM | POA: Diagnosis not present

## 2020-08-23 DIAGNOSIS — M1990 Unspecified osteoarthritis, unspecified site: Secondary | ICD-10-CM | POA: Diagnosis not present

## 2020-08-23 DIAGNOSIS — Z86711 Personal history of pulmonary embolism: Secondary | ICD-10-CM | POA: Diagnosis not present

## 2020-08-23 DIAGNOSIS — E785 Hyperlipidemia, unspecified: Secondary | ICD-10-CM | POA: Diagnosis not present

## 2020-08-23 DIAGNOSIS — S52002D Unspecified fracture of upper end of left ulna, subsequent encounter for closed fracture with routine healing: Secondary | ICD-10-CM | POA: Diagnosis not present

## 2020-08-23 DIAGNOSIS — D62 Acute posthemorrhagic anemia: Secondary | ICD-10-CM | POA: Diagnosis not present

## 2020-08-23 DIAGNOSIS — S36113D Laceration of liver, unspecified degree, subsequent encounter: Secondary | ICD-10-CM | POA: Diagnosis not present

## 2020-08-23 DIAGNOSIS — K429 Umbilical hernia without obstruction or gangrene: Secondary | ICD-10-CM | POA: Diagnosis not present

## 2020-08-23 DIAGNOSIS — R7303 Prediabetes: Secondary | ICD-10-CM | POA: Diagnosis not present

## 2020-08-23 DIAGNOSIS — E559 Vitamin D deficiency, unspecified: Secondary | ICD-10-CM | POA: Diagnosis not present

## 2020-08-23 DIAGNOSIS — J90 Pleural effusion, not elsewhere classified: Secondary | ICD-10-CM | POA: Diagnosis not present

## 2020-08-23 DIAGNOSIS — E8881 Metabolic syndrome: Secondary | ICD-10-CM | POA: Diagnosis not present

## 2020-08-23 DIAGNOSIS — E46 Unspecified protein-calorie malnutrition: Secondary | ICD-10-CM | POA: Diagnosis not present

## 2020-08-23 DIAGNOSIS — Z23 Encounter for immunization: Secondary | ICD-10-CM | POA: Diagnosis not present

## 2020-08-23 DIAGNOSIS — K219 Gastro-esophageal reflux disease without esophagitis: Secondary | ICD-10-CM | POA: Diagnosis not present

## 2020-08-23 DIAGNOSIS — S36112A Contusion of liver, initial encounter: Secondary | ICD-10-CM | POA: Diagnosis not present

## 2020-08-23 DIAGNOSIS — S52022D Displaced fracture of olecranon process without intraarticular extension of left ulna, subsequent encounter for closed fracture with routine healing: Secondary | ICD-10-CM | POA: Diagnosis not present

## 2020-08-23 MED ORDER — VITAMIN D 125 MCG (5000 UT) PO CAPS: 1.0000 | ORAL_CAPSULE | Freq: Every day | ORAL | 3 refills | Status: AC

## 2020-08-23 NOTE — TOC Transition Note (Signed)
Transition of Care [facility]) - CM/SW Discharge Note   Patient Details  Name: Lori Cooper MRN: [id number] Date of Birth: 1956-01-16  Transition of [facility]) CM/SW Contact:  Ella Bodo, RN Phone Number: 08/23/2020, 2:07 PM   Clinical Narrative:   Patient medically stable for discharge today to skilled nursing facility.  Insurance authorization has been received from pay your; spoke with [place] and [employer].  Authorization # for nonemergent ambulance transport [id number]; authorization # for skilled nursing facility is [id number].  Notified facility of authorization received, and notified attending.  Patient and her daughter notified;plan transport after lunch.  Discharge summary sent to facility per protocol.  Patient will discharge to room B 24-1; bedside nurse will need to call report to [phone].    Final next level of care: Skilled Nursing Facility Barriers to Discharge: Barriers Resolved   Patient Goals and CMS Choice Patient states their goals for this hospitalization and ongoing recovery are:: to go home CMS Medicare.gov Compare Post Acute Care list provided to:: Patient Choice offered to / list presented to : Patient  Discharge Placement              Patient chooses bed at: Other - please specify in the comment section below: [facility]) Patient to be transferred to facility by: [place] Name of family member notified: daughter, at bedside Patient and family notified of of transfer: 08/23/20  Discharge Plan and Services   Discharge Planning Services: CM Consult Post Acute Care Choice: [place]                               Social Determinants of Health (SDOH) Interventions     Readmission Risk Interventions No flowsheet data found.  Reinaldo Raddle, RN, BSN  Trauma/Neuro ICU Case Manager [phone]

## 2020-08-23 NOTE — Progress Notes (Signed)
PT Cancellation Note  Patient Details Name: Lori Cooper MRN: [id number] DOB: 03/11/1956   Cancelled Treatment:    Reason Eval/Treat Not Completed: Patient declined, no reason specified; reports already up three times this morning and doing better in terms of activity tolerance and less drainage out of her wound.  Reports she understands not to use L UE to push up.  Declined OOB mobility and EOB activities as planned d/c this pm to SNF.  Will defer treatment to SNF.    [name] 08/23/2020, 12:33 PM  Magda Kiel, PT Acute Rehabilitation Services HEKBT:[phone] Office:[phone] 08/23/2020

## 2020-08-23 NOTE — Progress Notes (Signed)
Pt premedicated for pain per her requst and then midline abdominal wound dressing changed, as ordered. Small amount of yellow/ brown purulent drainage present from tunneling areas on top and bottom. Two small tunneling holes present in middle of incision. Incision to be assessed by MD today, will pass on findings to day shift RN. Pt tolerated well with minimal complaints of pain.

## 2020-08-23 NOTE — Progress Notes (Signed)
OT Cancellation Note  Patient Details Name: Lori Cooper MRN: [id number] DOB: 11-May-1956   Cancelled Treatment:    Reason Eval/Treat Not Completed: Other (comment). Pt is suppose to be D/C'd to SNF today and prefers not to do therapy today.  [place], OTR/L Acute Rehab Services Pager [phone] Office [phone]     Almon Register 08/23/2020, 3:35 PM

## 2020-08-23 NOTE — Progress Notes (Signed)
Report given to Nurse Roselyn Reef at [facility].

## 2020-08-23 NOTE — Discharge Instructions (Signed)
Orthopaedic Discharge instructions                      NWB L UEx ok for elbow ROM, active and passive but no elbow extension against resistance             Forearm, wrist and hand ROM as tolerated                          Sling for comfort, does not have to wear  No lifting with L arm  Ice and elevate for swelling and pain              Ok to clean surgical wounds with soap and water only. No lotions or ointments                 CC[facility]      [facility], [place] [phone]  OPEN ABDOMINAL SURGERY: POST OP INSTRUCTIONS  Always review your discharge instruction sheet given to you by the facility where your surgery was performed.  IF YOU HAVE DISABILITY OR FAMILY LEAVE FORMS, YOU MUST BRING THEM TO THE OFFICE FOR PROCESSING.  PLEASE DO NOT GIVE THEM TO YOUR DOCTOR.  1. A prescription for pain medication may be given to you upon discharge.  Take your pain medication as prescribed, if needed.  If narcotic pain medicine is not needed, then you may take acetaminophen (Tylenol) or ibuprofen (Advil) as needed. 2. Take your usually prescribed medications unless otherwise directed. 3. If you need a refill on your pain medication, please contact your pharmacy. They will contact our office to request authorization.  Prescriptions will not be filled after 5pm or on week-ends. 4. You should follow a light diet the first few days after arrival home, such as soup and crackers, pudding, etc.unless your doctor has advised otherwise. A high-fiber, low fat diet can be resumed as tolerated.   Be sure to include lots of fluids daily. Most patients will experience some swelling and bruising on the chest and neck area.  Ice packs will help.  Swelling and bruising can take several days to resolve 5. Most patients will experience some swelling and bruising in the area of the incision. Ice pack will help. Swelling and bruising can take several days to resolve..  6. It is  common to experience some constipation if taking pain medication after surgery.  Increasing fluid intake and taking a stool softener will usually help or prevent this problem from occurring.  A mild laxative (Milk of Magnesia or Miralax) should be taken according to package directions if there are no bowel movements after 48 hours. 7.  You may have steri-strips (small skin tapes) in place directly over the incision.  These strips should be left on the skin for 7-10 days.  If your surgeon used skin glue on the incision, you may shower in 24 hours.  The glue will flake off over the next 2-3 weeks.  Any sutures or staples will be removed at the office during your follow-up visit. You may find that a light gauze bandage over your incision may keep your staples from being rubbed or pulled. You may shower and replace the bandage daily. 8. ACTIVITIES:  You may resume regular (light) daily activities beginning the next day--such as daily self-care, walking, climbing stairs--gradually increasing activities as tolerated.  You may have sexual intercourse when it is comfortable.  Refrain from any heavy lifting or straining  until approved by your doctor. a. You may drive when you no longer are taking prescription pain medication, you can comfortably wear a seatbelt, and you can safely maneuver your car and apply brakes b. Return to Work: ___________________________________ 72. You should see your doctor in the office for a follow-up appointment approximately two weeks after your surgery.  Make sure that you call for this appointment within a day or two after you arrive home to insure a convenient appointment time. OTHER INSTRUCTIONS:  _____________________________________________________________ _____________________________________________________________  WHEN TO CALL YOUR DOCTOR: 1. Fever over 101.0 2. Inability to urinate 3. Nausea and/or vomiting 4. Extreme swelling or bruising 5. Continued bleeding from  incision. 6. Increased pain, redness, or drainage from the incision. 7. Difficulty swallowing or breathing 8. Muscle cramping or spasms. 9. Numbness or tingling in hands or feet or around lips.  The clinic staff is available to answer your questions during regular business hours.  Please dont hesitate to call and ask to speak to one of the nurses if you have concerns.  For further questions, please visit [email or web address]  MIDLINE WOUND CARE: - midline dressing to be changed twice daily - supplies: sterile saline, kerlix, scissors, ABD pads, tape  - remove dressing and all packing carefully, moistening with sterile saline as needed to avoid packing/internal dressing sticking to the wound. - clean edges of skin around the wound with water/gauze, making sure there is no tape debris or leakage left on skin that could cause skin irritation or breakdown. - dampen and clean kerlix with sterile saline and pack wound from wound base to skin level, making sure to take note of any possible areas of wound tracking, tunneling and packing appropriately. Wound can be packed loosely. Trim kerlix to size if a whole kerlix is not required. - cover wound with a dry ABD pad and secure with tape.  - write the date/time on the dry dressing/tape to better track when the last dressing change occurred. - apply any skin protectant/powder recommended by clinician to protect skin/skin folds. - change dressing as needed if leakage occurs, wound gets contaminated, or patient requests to shower. - patient may shower daily with wound open and following the shower the wound should be dried and a clean dressing placed.
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
## 2020-08-26 DIAGNOSIS — R7303 Prediabetes: Secondary | ICD-10-CM | POA: Diagnosis not present

## 2020-08-26 DIAGNOSIS — S36031D Moderate laceration of spleen, subsequent encounter: Secondary | ICD-10-CM | POA: Diagnosis not present

## 2020-08-26 DIAGNOSIS — S52022D Displaced fracture of olecranon process without intraarticular extension of left ulna, subsequent encounter for closed fracture with routine healing: Secondary | ICD-10-CM | POA: Diagnosis not present

## 2020-08-26 DIAGNOSIS — S36409D Unspecified injury of unspecified part of small intestine, subsequent encounter: Secondary | ICD-10-CM | POA: Diagnosis not present

## 2020-08-26 DIAGNOSIS — S52202D Unspecified fracture of shaft of left ulna, subsequent encounter for closed fracture with routine healing: Secondary | ICD-10-CM | POA: Diagnosis not present

## 2020-08-26 DIAGNOSIS — S330XXD Traumatic rupture of lumbar intervertebral disc, subsequent encounter: Secondary | ICD-10-CM | POA: Diagnosis not present

## 2020-08-26 DIAGNOSIS — E559 Vitamin D deficiency, unspecified: Secondary | ICD-10-CM | POA: Diagnosis not present

## 2020-08-26 DIAGNOSIS — S2231XS Fracture of one rib, right side, sequela: Secondary | ICD-10-CM | POA: Diagnosis not present

## 2020-08-27 DIAGNOSIS — S36112A Contusion of liver, initial encounter: Secondary | ICD-10-CM | POA: Diagnosis not present

## 2020-08-27 DIAGNOSIS — S2231XA Fracture of one rib, right side, initial encounter for closed fracture: Secondary | ICD-10-CM | POA: Diagnosis not present

## 2020-09-04 DIAGNOSIS — S52222D Displaced transverse fracture of shaft of left ulna, subsequent encounter for closed fracture with routine healing: Secondary | ICD-10-CM | POA: Diagnosis not present

## 2020-09-04 DIAGNOSIS — S52032D Displaced fracture of olecranon process with intraarticular extension of left ulna, subsequent encounter for closed fracture with routine healing: Secondary | ICD-10-CM | POA: Diagnosis not present

## 2020-09-05 DIAGNOSIS — S52022D Displaced fracture of olecranon process without intraarticular extension of left ulna, subsequent encounter for closed fracture with routine healing: Secondary | ICD-10-CM | POA: Diagnosis not present

## 2020-09-05 DIAGNOSIS — E559 Vitamin D deficiency, unspecified: Secondary | ICD-10-CM | POA: Diagnosis not present

## 2020-09-05 DIAGNOSIS — S2231XA Fracture of one rib, right side, initial encounter for closed fracture: Secondary | ICD-10-CM | POA: Diagnosis not present

## 2020-09-05 DIAGNOSIS — K219 Gastro-esophageal reflux disease without esophagitis: Secondary | ICD-10-CM | POA: Diagnosis not present

## 2020-09-05 DIAGNOSIS — S52202D Unspecified fracture of shaft of left ulna, subsequent encounter for closed fracture with routine healing: Secondary | ICD-10-CM | POA: Diagnosis not present

## 2020-09-05 DIAGNOSIS — S2231XS Fracture of one rib, right side, sequela: Secondary | ICD-10-CM | POA: Diagnosis not present

## 2020-09-05 DIAGNOSIS — S36409D Unspecified injury of unspecified part of small intestine, subsequent encounter: Secondary | ICD-10-CM | POA: Diagnosis not present

## 2020-09-05 DIAGNOSIS — S36112A Contusion of liver, initial encounter: Secondary | ICD-10-CM | POA: Diagnosis not present

## 2020-09-08 DIAGNOSIS — J939 Pneumothorax, unspecified: Secondary | ICD-10-CM | POA: Diagnosis not present

## 2020-09-08 DIAGNOSIS — S52202D Unspecified fracture of shaft of left ulna, subsequent encounter for closed fracture with routine healing: Secondary | ICD-10-CM | POA: Diagnosis not present

## 2020-09-08 DIAGNOSIS — S52022D Displaced fracture of olecranon process without intraarticular extension of left ulna, subsequent encounter for closed fracture with routine healing: Secondary | ICD-10-CM | POA: Diagnosis not present

## 2020-09-08 DIAGNOSIS — S36409D Unspecified injury of unspecified part of small intestine, subsequent encounter: Secondary | ICD-10-CM | POA: Diagnosis not present

## 2020-09-08 DIAGNOSIS — S330XXD Traumatic rupture of lumbar intervertebral disc, subsequent encounter: Secondary | ICD-10-CM | POA: Diagnosis not present

## 2020-09-08 DIAGNOSIS — E8881 Metabolic syndrome: Secondary | ICD-10-CM | POA: Diagnosis not present

## 2020-09-08 DIAGNOSIS — E668 Other obesity: Secondary | ICD-10-CM | POA: Diagnosis not present

## 2020-09-08 DIAGNOSIS — F419 Anxiety disorder, unspecified: Secondary | ICD-10-CM | POA: Diagnosis not present

## 2020-09-08 DIAGNOSIS — S36031D Moderate laceration of spleen, subsequent encounter: Secondary | ICD-10-CM | POA: Diagnosis not present

## 2020-09-08 DIAGNOSIS — S2231XD Fracture of one rib, right side, subsequent encounter for fracture with routine healing: Secondary | ICD-10-CM | POA: Diagnosis not present

## 2020-09-08 DIAGNOSIS — S36112D Contusion of liver, subsequent encounter: Secondary | ICD-10-CM | POA: Diagnosis not present

## 2020-09-08 DIAGNOSIS — F32A Depression, unspecified: Secondary | ICD-10-CM | POA: Diagnosis not present

## 2020-09-12 DIAGNOSIS — Z9889 Other specified postprocedural states: Secondary | ICD-10-CM | POA: Diagnosis not present

## 2020-09-12 DIAGNOSIS — R2681 Unsteadiness on feet: Secondary | ICD-10-CM | POA: Diagnosis not present

## 2020-09-16 ENCOUNTER — Other Ambulatory Visit: Payer: Self-pay | Admitting: Physician Assistant

## 2020-09-16 DIAGNOSIS — S330XXD Traumatic rupture of lumbar intervertebral disc, subsequent encounter: Secondary | ICD-10-CM | POA: Diagnosis not present

## 2020-09-16 DIAGNOSIS — J939 Pneumothorax, unspecified: Secondary | ICD-10-CM | POA: Diagnosis not present

## 2020-09-16 DIAGNOSIS — S36409D Unspecified injury of unspecified part of small intestine, subsequent encounter: Secondary | ICD-10-CM | POA: Diagnosis not present

## 2020-09-16 DIAGNOSIS — F32A Depression, unspecified: Secondary | ICD-10-CM | POA: Diagnosis not present

## 2020-09-16 DIAGNOSIS — S52202D Unspecified fracture of shaft of left ulna, subsequent encounter for closed fracture with routine healing: Secondary | ICD-10-CM | POA: Diagnosis not present

## 2020-09-16 DIAGNOSIS — E8881 Metabolic syndrome: Secondary | ICD-10-CM | POA: Diagnosis not present

## 2020-09-16 DIAGNOSIS — S2231XD Fracture of one rib, right side, subsequent encounter for fracture with routine healing: Secondary | ICD-10-CM | POA: Diagnosis not present

## 2020-09-16 DIAGNOSIS — R1011 Right upper quadrant pain: Secondary | ICD-10-CM

## 2020-09-16 DIAGNOSIS — E668 Other obesity: Secondary | ICD-10-CM | POA: Diagnosis not present

## 2020-09-16 DIAGNOSIS — S52022D Displaced fracture of olecranon process without intraarticular extension of left ulna, subsequent encounter for closed fracture with routine healing: Secondary | ICD-10-CM | POA: Diagnosis not present

## 2020-09-16 DIAGNOSIS — F419 Anxiety disorder, unspecified: Secondary | ICD-10-CM | POA: Diagnosis not present

## 2020-09-16 DIAGNOSIS — S36112D Contusion of liver, subsequent encounter: Secondary | ICD-10-CM | POA: Diagnosis not present

## 2020-09-16 DIAGNOSIS — S36031D Moderate laceration of spleen, subsequent encounter: Secondary | ICD-10-CM | POA: Diagnosis not present

## 2020-09-25 DIAGNOSIS — S52032D Displaced fracture of olecranon process with intraarticular extension of left ulna, subsequent encounter for closed fracture with routine healing: Secondary | ICD-10-CM | POA: Diagnosis not present

## 2020-09-25 DIAGNOSIS — S52222 Displaced transverse fracture of shaft of left ulna: Secondary | ICD-10-CM | POA: Diagnosis not present

## 2020-09-25 DIAGNOSIS — S52032 Displaced fracture of olecranon process with intraarticular extension of left ulna: Secondary | ICD-10-CM | POA: Diagnosis not present

## 2020-09-25 DIAGNOSIS — S52222D Displaced transverse fracture of shaft of left ulna, subsequent encounter for closed fracture with routine healing: Secondary | ICD-10-CM | POA: Diagnosis not present

## 2020-09-27 ENCOUNTER — Other Ambulatory Visit: Payer: PPO

## 2020-09-27 ENCOUNTER — Ambulatory Visit: Payer: PPO | Admitting: Family

## 2020-09-30 ENCOUNTER — Ambulatory Visit
Admission: RE | Admit: 2020-09-30 | Discharge: 2020-09-30 | Disposition: A | Discharge status: Home / Self Care | Payer: PPO | Source: Ambulatory Visit | Attending: Physician Assistant | Admitting: Physician Assistant

## 2020-09-30 DIAGNOSIS — K439 Ventral hernia without obstruction or gangrene: Secondary | ICD-10-CM | POA: Diagnosis not present

## 2020-09-30 DIAGNOSIS — K449 Diaphragmatic hernia without obstruction or gangrene: Secondary | ICD-10-CM | POA: Diagnosis not present

## 2020-09-30 DIAGNOSIS — K6389 Other specified diseases of intestine: Secondary | ICD-10-CM | POA: Diagnosis not present

## 2020-09-30 DIAGNOSIS — K469 Unspecified abdominal hernia without obstruction or gangrene: Secondary | ICD-10-CM | POA: Diagnosis not present

## 2020-09-30 DIAGNOSIS — R1011 Right upper quadrant pain: Secondary | ICD-10-CM

## 2020-09-30 MED ORDER — IOPAMIDOL (ISOVUE-300) INJECTION 61%
100.0000 mL | Freq: Once | INTRAVENOUS | Status: AC | PRN
  Administered 2020-09-30: 100 mL via INTRAVENOUS

## 2020-10-16 DIAGNOSIS — F32A Depression, unspecified: Secondary | ICD-10-CM | POA: Diagnosis not present

## 2020-10-16 DIAGNOSIS — E668 Other obesity: Secondary | ICD-10-CM | POA: Diagnosis not present

## 2020-10-16 DIAGNOSIS — S52202D Unspecified fracture of shaft of left ulna, subsequent encounter for closed fracture with routine healing: Secondary | ICD-10-CM | POA: Diagnosis not present

## 2020-10-16 DIAGNOSIS — S36112D Contusion of liver, subsequent encounter: Secondary | ICD-10-CM | POA: Diagnosis not present

## 2020-10-16 DIAGNOSIS — E8881 Metabolic syndrome: Secondary | ICD-10-CM | POA: Diagnosis not present

## 2020-10-16 DIAGNOSIS — S330XXD Traumatic rupture of lumbar intervertebral disc, subsequent encounter: Secondary | ICD-10-CM | POA: Diagnosis not present

## 2020-10-16 DIAGNOSIS — J939 Pneumothorax, unspecified: Secondary | ICD-10-CM | POA: Diagnosis not present

## 2020-10-16 DIAGNOSIS — F419 Anxiety disorder, unspecified: Secondary | ICD-10-CM | POA: Diagnosis not present

## 2020-10-16 DIAGNOSIS — S36409D Unspecified injury of unspecified part of small intestine, subsequent encounter: Secondary | ICD-10-CM | POA: Diagnosis not present

## 2020-10-16 DIAGNOSIS — S2231XD Fracture of one rib, right side, subsequent encounter for fracture with routine healing: Secondary | ICD-10-CM | POA: Diagnosis not present

## 2020-10-16 DIAGNOSIS — S52022D Displaced fracture of olecranon process without intraarticular extension of left ulna, subsequent encounter for closed fracture with routine healing: Secondary | ICD-10-CM | POA: Diagnosis not present

## 2020-10-16 DIAGNOSIS — S36031D Moderate laceration of spleen, subsequent encounter: Secondary | ICD-10-CM | POA: Diagnosis not present

## 2020-10-23 DIAGNOSIS — S52032D Displaced fracture of olecranon process with intraarticular extension of left ulna, subsequent encounter for closed fracture with routine healing: Secondary | ICD-10-CM | POA: Diagnosis not present

## 2020-10-23 DIAGNOSIS — S52222D Displaced transverse fracture of shaft of left ulna, subsequent encounter for closed fracture with routine healing: Secondary | ICD-10-CM | POA: Diagnosis not present

## 2020-10-29 ENCOUNTER — Other Ambulatory Visit: Payer: Self-pay

## 2020-10-29 ENCOUNTER — Ambulatory Visit (INDEPENDENT_AMBULATORY_CARE_PROVIDER_SITE_OTHER): Payer: PPO

## 2020-10-29 DIAGNOSIS — Z9049 Acquired absence of other specified parts of digestive tract: Secondary | ICD-10-CM

## 2020-10-29 DIAGNOSIS — J939 Pneumothorax, unspecified: Secondary | ICD-10-CM

## 2020-10-29 DIAGNOSIS — S52202D Unspecified fracture of shaft of left ulna, subsequent encounter for closed fracture with routine healing: Secondary | ICD-10-CM

## 2020-10-29 DIAGNOSIS — S36112D Contusion of liver, subsequent encounter: Secondary | ICD-10-CM

## 2020-10-29 DIAGNOSIS — S36409D Unspecified injury of unspecified part of small intestine, subsequent encounter: Secondary | ICD-10-CM

## 2020-10-29 DIAGNOSIS — E8881 Metabolic syndrome: Secondary | ICD-10-CM | POA: Diagnosis not present

## 2020-10-29 DIAGNOSIS — J45909 Unspecified asthma, uncomplicated: Secondary | ICD-10-CM

## 2020-10-29 DIAGNOSIS — S2231XD Fracture of one rib, right side, subsequent encounter for fracture with routine healing: Secondary | ICD-10-CM

## 2020-10-29 DIAGNOSIS — E668 Other obesity: Secondary | ICD-10-CM

## 2020-10-29 DIAGNOSIS — G2581 Restless legs syndrome: Secondary | ICD-10-CM

## 2020-10-29 DIAGNOSIS — R7303 Prediabetes: Secondary | ICD-10-CM

## 2020-10-29 DIAGNOSIS — F419 Anxiety disorder, unspecified: Secondary | ICD-10-CM

## 2020-10-29 DIAGNOSIS — M1990 Unspecified osteoarthritis, unspecified site: Secondary | ICD-10-CM

## 2020-10-29 DIAGNOSIS — F32A Depression, unspecified: Secondary | ICD-10-CM | POA: Diagnosis not present

## 2020-10-29 DIAGNOSIS — E785 Hyperlipidemia, unspecified: Secondary | ICD-10-CM

## 2020-10-29 DIAGNOSIS — S36031D Moderate laceration of spleen, subsequent encounter: Secondary | ICD-10-CM

## 2020-10-29 DIAGNOSIS — K219 Gastro-esophageal reflux disease without esophagitis: Secondary | ICD-10-CM

## 2020-10-29 DIAGNOSIS — S52022D Displaced fracture of olecranon process without intraarticular extension of left ulna, subsequent encounter for closed fracture with routine healing: Secondary | ICD-10-CM | POA: Diagnosis not present

## 2020-10-29 DIAGNOSIS — E559 Vitamin D deficiency, unspecified: Secondary | ICD-10-CM

## 2020-10-29 DIAGNOSIS — S330XXD Traumatic rupture of lumbar intervertebral disc, subsequent encounter: Secondary | ICD-10-CM

## 2020-10-29 DIAGNOSIS — F17210 Nicotine dependence, cigarettes, uncomplicated: Secondary | ICD-10-CM

## 2020-10-29 DIAGNOSIS — Z86711 Personal history of pulmonary embolism: Secondary | ICD-10-CM

## 2020-10-29 DIAGNOSIS — K429 Umbilical hernia without obstruction or gangrene: Secondary | ICD-10-CM

## 2020-12-03 ENCOUNTER — Encounter: Payer: Self-pay | Admitting: Family

## 2020-12-03 ENCOUNTER — Ambulatory Visit (INDEPENDENT_AMBULATORY_CARE_PROVIDER_SITE_OTHER): Payer: PPO | Admitting: Family

## 2020-12-03 ENCOUNTER — Ambulatory Visit: Payer: PPO

## 2020-12-03 DIAGNOSIS — E559 Vitamin D deficiency, unspecified: Secondary | ICD-10-CM

## 2020-12-03 DIAGNOSIS — F411 Generalized anxiety disorder: Secondary | ICD-10-CM

## 2020-12-03 DIAGNOSIS — K219 Gastro-esophageal reflux disease without esophagitis: Secondary | ICD-10-CM | POA: Diagnosis not present

## 2020-12-03 DIAGNOSIS — E782 Mixed hyperlipidemia: Secondary | ICD-10-CM

## 2020-12-03 DIAGNOSIS — F331 Major depressive disorder, recurrent, moderate: Secondary | ICD-10-CM | POA: Diagnosis not present

## 2020-12-03 DIAGNOSIS — R5383 Other fatigue: Secondary | ICD-10-CM

## 2020-12-03 DIAGNOSIS — M1990 Unspecified osteoarthritis, unspecified site: Secondary | ICD-10-CM | POA: Diagnosis not present

## 2020-12-03 DIAGNOSIS — Z86711 Personal history of pulmonary embolism: Secondary | ICD-10-CM | POA: Diagnosis not present

## 2020-12-03 DIAGNOSIS — R7303 Prediabetes: Secondary | ICD-10-CM | POA: Diagnosis not present
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
## 2020-12-04 DIAGNOSIS — F411 Generalized anxiety disorder: Secondary | ICD-10-CM

## 2020-12-04 DIAGNOSIS — F331 Major depressive disorder, recurrent, moderate: Secondary | ICD-10-CM

## 2020-12-05 MED ORDER — FLUOXETINE HCL 40 MG PO CAPS: 80.0000 mg | ORAL_CAPSULE | Freq: Every day | ORAL | 0 refills | Status: AC

## 2020-12-09 ENCOUNTER — Observation Stay (HOSPITAL_COMMUNITY): Payer: PPO

## 2020-12-09 ENCOUNTER — Encounter (HOSPITAL_COMMUNITY): Payer: Self-pay | Admitting: Emergency Medicine

## 2020-12-09 ENCOUNTER — Emergency Department (HOSPITAL_COMMUNITY): Payer: PPO

## 2020-12-09 ENCOUNTER — Other Ambulatory Visit: Payer: Self-pay

## 2020-12-09 ENCOUNTER — Inpatient Hospital Stay (HOSPITAL_COMMUNITY)
Admission: EM | Admit: 2020-12-09 | Discharge: 2021-01-11 | DRG: 432 | Disposition: A | Discharge status: Home Health | Payer: PPO | Attending: Student | Admitting: Student

## 2020-12-09 DIAGNOSIS — Z938 Other artificial opening status: Secondary | ICD-10-CM

## 2020-12-09 DIAGNOSIS — S2239XG Fracture of one rib, unspecified side, subsequent encounter for fracture with delayed healing: Secondary | ICD-10-CM

## 2020-12-09 DIAGNOSIS — J90 Pleural effusion, not elsewhere classified: Secondary | ICD-10-CM | POA: Diagnosis present

## 2020-12-09 DIAGNOSIS — J939 Pneumothorax, unspecified: Secondary | ICD-10-CM

## 2020-12-09 DIAGNOSIS — F418 Other specified anxiety disorders: Secondary | ICD-10-CM | POA: Diagnosis not present

## 2020-12-09 DIAGNOSIS — R1319 Other dysphagia: Secondary | ICD-10-CM | POA: Diagnosis present

## 2020-12-09 DIAGNOSIS — Z4682 Encounter for fitting and adjustment of non-vascular catheter: Secondary | ICD-10-CM | POA: Diagnosis not present

## 2020-12-09 DIAGNOSIS — S31109A Unspecified open wound of abdominal wall, unspecified quadrant without penetration into peritoneal cavity, initial encounter: Secondary | ICD-10-CM | POA: Diagnosis not present

## 2020-12-09 DIAGNOSIS — R1310 Dysphagia, unspecified: Secondary | ICD-10-CM

## 2020-12-09 DIAGNOSIS — R7989 Other specified abnormal findings of blood chemistry: Secondary | ICD-10-CM | POA: Diagnosis not present

## 2020-12-09 DIAGNOSIS — J9811 Atelectasis: Secondary | ICD-10-CM | POA: Diagnosis not present

## 2020-12-09 DIAGNOSIS — K259 Gastric ulcer, unspecified as acute or chronic, without hemorrhage or perforation: Secondary | ICD-10-CM | POA: Diagnosis not present

## 2020-12-09 DIAGNOSIS — Y92230 Patient room in hospital as the place of occurrence of the external cause: Secondary | ICD-10-CM | POA: Diagnosis not present

## 2020-12-09 DIAGNOSIS — I959 Hypotension, unspecified: Secondary | ICD-10-CM | POA: Diagnosis present

## 2020-12-09 DIAGNOSIS — T17320A Food in larynx causing asphyxiation, initial encounter: Secondary | ICD-10-CM

## 2020-12-09 DIAGNOSIS — S2231XK Fracture of one rib, right side, subsequent encounter for fracture with nonunion: Secondary | ICD-10-CM | POA: Diagnosis not present

## 2020-12-09 DIAGNOSIS — Z7951 Long term (current) use of inhaled steroids: Secondary | ICD-10-CM

## 2020-12-09 DIAGNOSIS — Z48813 Encounter for surgical aftercare following surgery on the respiratory system: Secondary | ICD-10-CM | POA: Diagnosis not present

## 2020-12-09 DIAGNOSIS — R7401 Elevation of levels of liver transaminase levels: Secondary | ICD-10-CM | POA: Diagnosis present

## 2020-12-09 DIAGNOSIS — J948 Other specified pleural conditions: Secondary | ICD-10-CM | POA: Diagnosis present

## 2020-12-09 DIAGNOSIS — B3781 Candidal esophagitis: Secondary | ICD-10-CM | POA: Diagnosis present

## 2020-12-09 DIAGNOSIS — S8012XA Contusion of left lower leg, initial encounter: Secondary | ICD-10-CM | POA: Diagnosis not present

## 2020-12-09 DIAGNOSIS — W1830XA Fall on same level, unspecified, initial encounter: Secondary | ICD-10-CM | POA: Diagnosis not present

## 2020-12-09 DIAGNOSIS — E872 Acidosis: Secondary | ICD-10-CM | POA: Diagnosis present

## 2020-12-09 DIAGNOSIS — D638 Anemia in other chronic diseases classified elsewhere: Secondary | ICD-10-CM | POA: Diagnosis present

## 2020-12-09 DIAGNOSIS — K224 Dyskinesia of esophagus: Secondary | ICD-10-CM | POA: Diagnosis present

## 2020-12-09 DIAGNOSIS — D689 Coagulation defect, unspecified: Secondary | ICD-10-CM | POA: Diagnosis present

## 2020-12-09 DIAGNOSIS — E440 Moderate protein-calorie malnutrition: Secondary | ICD-10-CM | POA: Diagnosis present

## 2020-12-09 DIAGNOSIS — J690 Pneumonitis due to inhalation of food and vomit: Secondary | ICD-10-CM | POA: Diagnosis present

## 2020-12-09 DIAGNOSIS — Z452 Encounter for adjustment and management of vascular access device: Secondary | ICD-10-CM | POA: Diagnosis not present

## 2020-12-09 DIAGNOSIS — I700 Atherosclerosis of aorta: Secondary | ICD-10-CM | POA: Diagnosis not present

## 2020-12-09 DIAGNOSIS — K229 Disease of esophagus, unspecified: Secondary | ICD-10-CM | POA: Diagnosis not present

## 2020-12-09 DIAGNOSIS — Z781 Physical restraint status: Secondary | ICD-10-CM

## 2020-12-09 DIAGNOSIS — E875 Hyperkalemia: Secondary | ICD-10-CM | POA: Diagnosis present

## 2020-12-09 DIAGNOSIS — E8809 Other disorders of plasma-protein metabolism, not elsewhere classified: Secondary | ICD-10-CM | POA: Diagnosis not present

## 2020-12-09 DIAGNOSIS — K7200 Acute and subacute hepatic failure without coma: Secondary | ICD-10-CM | POA: Diagnosis not present

## 2020-12-09 DIAGNOSIS — Z79899 Other long term (current) drug therapy: Secondary | ICD-10-CM

## 2020-12-09 DIAGNOSIS — Z6833 Body mass index (BMI) 33.0-33.9, adult: Secondary | ICD-10-CM

## 2020-12-09 DIAGNOSIS — J918 Pleural effusion in other conditions classified elsewhere: Secondary | ICD-10-CM | POA: Diagnosis present

## 2020-12-09 DIAGNOSIS — K319 Disease of stomach and duodenum, unspecified: Secondary | ICD-10-CM | POA: Diagnosis present

## 2020-12-09 DIAGNOSIS — K2970 Gastritis, unspecified, without bleeding: Secondary | ICD-10-CM | POA: Diagnosis present

## 2020-12-09 DIAGNOSIS — R933 Abnormal findings on diagnostic imaging of other parts of digestive tract: Secondary | ICD-10-CM | POA: Diagnosis not present

## 2020-12-09 DIAGNOSIS — Z9841 Cataract extraction status, right eye: Secondary | ICD-10-CM

## 2020-12-09 DIAGNOSIS — E66811 Obesity, class 1: Secondary | ICD-10-CM | POA: Diagnosis present

## 2020-12-09 DIAGNOSIS — R188 Other ascites: Secondary | ICD-10-CM

## 2020-12-09 DIAGNOSIS — M479 Spondylosis, unspecified: Secondary | ICD-10-CM | POA: Diagnosis present

## 2020-12-09 DIAGNOSIS — K449 Diaphragmatic hernia without obstruction or gangrene: Secondary | ICD-10-CM

## 2020-12-09 DIAGNOSIS — Z86010 Personal history of colonic polyps: Secondary | ICD-10-CM

## 2020-12-09 DIAGNOSIS — N179 Acute kidney failure, unspecified: Secondary | ICD-10-CM | POA: Diagnosis not present

## 2020-12-09 DIAGNOSIS — Z7189 Other specified counseling: Secondary | ICD-10-CM | POA: Diagnosis not present

## 2020-12-09 DIAGNOSIS — R9431 Abnormal electrocardiogram [ECG] [EKG]: Secondary | ICD-10-CM | POA: Diagnosis present

## 2020-12-09 DIAGNOSIS — Z961 Presence of intraocular lens: Secondary | ICD-10-CM | POA: Diagnosis present

## 2020-12-09 DIAGNOSIS — K7290 Hepatic failure, unspecified without coma: Secondary | ICD-10-CM | POA: Diagnosis present

## 2020-12-09 DIAGNOSIS — E782 Mixed hyperlipidemia: Secondary | ICD-10-CM | POA: Diagnosis not present

## 2020-12-09 DIAGNOSIS — G8929 Other chronic pain: Secondary | ICD-10-CM | POA: Diagnosis present

## 2020-12-09 DIAGNOSIS — J986 Disorders of diaphragm: Secondary | ICD-10-CM | POA: Diagnosis not present

## 2020-12-09 DIAGNOSIS — R918 Other nonspecific abnormal finding of lung field: Secondary | ICD-10-CM | POA: Diagnosis not present

## 2020-12-09 DIAGNOSIS — X58XXXA Exposure to other specified factors, initial encounter: Secondary | ICD-10-CM | POA: Diagnosis not present

## 2020-12-09 DIAGNOSIS — E785 Hyperlipidemia, unspecified: Secondary | ICD-10-CM | POA: Diagnosis present

## 2020-12-09 DIAGNOSIS — R195 Other fecal abnormalities: Secondary | ICD-10-CM | POA: Diagnosis not present

## 2020-12-09 DIAGNOSIS — K7581 Nonalcoholic steatohepatitis (NASH): Secondary | ICD-10-CM | POA: Diagnosis present

## 2020-12-09 DIAGNOSIS — Z9689 Presence of other specified functional implants: Secondary | ICD-10-CM | POA: Diagnosis not present

## 2020-12-09 DIAGNOSIS — E162 Hypoglycemia, unspecified: Secondary | ICD-10-CM | POA: Diagnosis not present

## 2020-12-09 DIAGNOSIS — Z20822 Contact with and (suspected) exposure to covid-19: Secondary | ICD-10-CM | POA: Diagnosis present

## 2020-12-09 DIAGNOSIS — R945 Abnormal results of liver function studies: Secondary | ICD-10-CM | POA: Diagnosis not present

## 2020-12-09 DIAGNOSIS — D509 Iron deficiency anemia, unspecified: Secondary | ICD-10-CM | POA: Diagnosis present

## 2020-12-09 DIAGNOSIS — K767 Hepatorenal syndrome: Secondary | ICD-10-CM | POA: Diagnosis present

## 2020-12-09 DIAGNOSIS — J9601 Acute respiratory failure with hypoxia: Secondary | ICD-10-CM | POA: Diagnosis present

## 2020-12-09 DIAGNOSIS — I10 Essential (primary) hypertension: Secondary | ICD-10-CM | POA: Diagnosis present

## 2020-12-09 DIAGNOSIS — R0602 Shortness of breath: Secondary | ICD-10-CM | POA: Diagnosis present

## 2020-12-09 DIAGNOSIS — E669 Obesity, unspecified: Secondary | ICD-10-CM | POA: Diagnosis present

## 2020-12-09 DIAGNOSIS — K769 Liver disease, unspecified: Secondary | ICD-10-CM | POA: Diagnosis not present

## 2020-12-09 DIAGNOSIS — M6281 Muscle weakness (generalized): Secondary | ICD-10-CM | POA: Diagnosis not present

## 2020-12-09 DIAGNOSIS — E871 Hypo-osmolality and hyponatremia: Secondary | ICD-10-CM | POA: Diagnosis not present

## 2020-12-09 DIAGNOSIS — E876 Hypokalemia: Secondary | ICD-10-CM | POA: Diagnosis present

## 2020-12-09 DIAGNOSIS — R1314 Dysphagia, pharyngoesophageal phase: Secondary | ICD-10-CM | POA: Diagnosis present

## 2020-12-09 DIAGNOSIS — F419 Anxiety disorder, unspecified: Secondary | ICD-10-CM | POA: Diagnosis present

## 2020-12-09 DIAGNOSIS — F329 Major depressive disorder, single episode, unspecified: Secondary | ICD-10-CM | POA: Diagnosis not present

## 2020-12-09 DIAGNOSIS — M549 Dorsalgia, unspecified: Secondary | ICD-10-CM | POA: Diagnosis present

## 2020-12-09 DIAGNOSIS — Z8249 Family history of ischemic heart disease and other diseases of the circulatory system: Secondary | ICD-10-CM

## 2020-12-09 DIAGNOSIS — K766 Portal hypertension: Secondary | ICD-10-CM

## 2020-12-09 DIAGNOSIS — K759 Inflammatory liver disease, unspecified: Secondary | ICD-10-CM | POA: Diagnosis not present

## 2020-12-09 DIAGNOSIS — Z86711 Personal history of pulmonary embolism: Secondary | ICD-10-CM | POA: Diagnosis present

## 2020-12-09 DIAGNOSIS — K7460 Unspecified cirrhosis of liver: Secondary | ICD-10-CM | POA: Diagnosis present

## 2020-12-09 DIAGNOSIS — Z9842 Cataract extraction status, left eye: Secondary | ICD-10-CM

## 2020-12-09 DIAGNOSIS — E861 Hypovolemia: Secondary | ICD-10-CM | POA: Diagnosis not present

## 2020-12-09 DIAGNOSIS — S8011XA Contusion of right lower leg, initial encounter: Secondary | ICD-10-CM | POA: Diagnosis not present

## 2020-12-09 DIAGNOSIS — F331 Major depressive disorder, recurrent, moderate: Secondary | ICD-10-CM | POA: Diagnosis not present

## 2020-12-09 DIAGNOSIS — I517 Cardiomegaly: Secondary | ICD-10-CM | POA: Diagnosis not present

## 2020-12-09 DIAGNOSIS — E559 Vitamin D deficiency, unspecified: Secondary | ICD-10-CM | POA: Diagnosis not present

## 2020-12-09 DIAGNOSIS — F32A Depression, unspecified: Secondary | ICD-10-CM | POA: Diagnosis present

## 2020-12-09 DIAGNOSIS — Z7901 Long term (current) use of anticoagulants: Secondary | ICD-10-CM

## 2020-12-09 DIAGNOSIS — Z9889 Other specified postprocedural states: Secondary | ICD-10-CM

## 2020-12-09 DIAGNOSIS — K225 Diverticulum of esophagus, acquired: Secondary | ICD-10-CM | POA: Diagnosis not present

## 2020-12-09 DIAGNOSIS — R0902 Hypoxemia: Secondary | ICD-10-CM | POA: Diagnosis not present

## 2020-12-09 DIAGNOSIS — R7303 Prediabetes: Secondary | ICD-10-CM | POA: Diagnosis present

## 2020-12-09 DIAGNOSIS — E222 Syndrome of inappropriate secretion of antidiuretic hormone: Secondary | ICD-10-CM | POA: Diagnosis present

## 2020-12-09 DIAGNOSIS — Z515 Encounter for palliative care: Secondary | ICD-10-CM | POA: Diagnosis not present

## 2020-12-09 DIAGNOSIS — Z978 Presence of other specified devices: Secondary | ICD-10-CM | POA: Diagnosis not present

## 2020-12-09 DIAGNOSIS — K3189 Other diseases of stomach and duodenum: Secondary | ICD-10-CM | POA: Diagnosis not present

## 2020-12-09 DIAGNOSIS — E7439 Other disorders of intestinal carbohydrate absorption: Secondary | ICD-10-CM | POA: Diagnosis present

## 2020-12-09 DIAGNOSIS — R0600 Dyspnea, unspecified: Secondary | ICD-10-CM | POA: Diagnosis not present

## 2020-12-09 DIAGNOSIS — T8189XD Other complications of procedures, not elsewhere classified, subsequent encounter: Secondary | ICD-10-CM | POA: Diagnosis not present

## 2020-12-09 DIAGNOSIS — S2222XD Fracture of body of sternum, subsequent encounter for fracture with routine healing: Secondary | ICD-10-CM | POA: Diagnosis not present

## 2020-12-09 DIAGNOSIS — Z87891 Personal history of nicotine dependence: Secondary | ICD-10-CM

## 2020-12-09 DIAGNOSIS — J811 Chronic pulmonary edema: Secondary | ICD-10-CM | POA: Diagnosis not present

## 2020-12-09 DIAGNOSIS — E8770 Fluid overload, unspecified: Secondary | ICD-10-CM | POA: Diagnosis not present

## 2020-12-09 DIAGNOSIS — G2581 Restless legs syndrome: Secondary | ICD-10-CM | POA: Diagnosis present

## 2020-12-09 DIAGNOSIS — T502X5A Adverse effect of carbonic-anhydrase inhibitors, benzothiadiazides and other diuretics, initial encounter: Secondary | ICD-10-CM | POA: Diagnosis not present

## 2020-12-09 DIAGNOSIS — K219 Gastro-esophageal reflux disease without esophagitis: Secondary | ICD-10-CM | POA: Diagnosis present

## 2020-12-09 DIAGNOSIS — Z9049 Acquired absence of other specified parts of digestive tract: Secondary | ICD-10-CM

## 2020-12-09 LAB — CBC WITH DIFFERENTIAL/PLATELET
Abs Immature Granulocytes: 0.03 10*3/uL (ref 0.00–0.07)
Basophils Absolute: 0.1 10*3/uL (ref 0.0–0.1)
Basophils Relative: 1 %
Eosinophils Absolute: 0.2 10*3/uL (ref 0.0–0.5)
Eosinophils Relative: 2 %
HCT: 37.7 % (ref 36.0–46.0)
Hemoglobin: 12.5 g/dL (ref 12.0–15.0)
Immature Granulocytes: 0 %
Lymphocytes Relative: 18 %
Lymphs Abs: 2 10*3/uL (ref 0.7–4.0)
MCH: 26.5 pg (ref 26.0–34.0)
MCHC: 33.2 g/dL (ref 30.0–36.0)
MCV: 79.9 fL — ABNORMAL LOW (ref 80.0–100.0)
Monocytes Absolute: 0.7 10*3/uL (ref 0.1–1.0)
Monocytes Relative: 6 %
Neutro Abs: 8.2 10*3/uL — ABNORMAL HIGH (ref 1.7–7.7)
Neutrophils Relative %: 73 %
Platelets: 349 10*3/uL (ref 150–400)
RBC: 4.72 MIL/uL (ref 3.87–5.11)
RDW: 20 % — ABNORMAL HIGH (ref 11.5–15.5)
WBC: 11.2 10*3/uL — ABNORMAL HIGH (ref 4.0–10.5)
nRBC: 0 % (ref 0.0–0.2)

## 2020-12-09 LAB — COMPREHENSIVE METABOLIC PANEL
ALT: 116 U/L — ABNORMAL HIGH (ref 0–44)
AST: 274 U/L — ABNORMAL HIGH (ref 15–41)
Albumin: 2.6 g/dL — ABNORMAL LOW (ref 3.5–5.0)
Alkaline Phosphatase: 147 U/L — ABNORMAL HIGH (ref 38–126)
Anion gap: 10 (ref 5–15)
BUN: 9 mg/dL (ref 8–23)
CO2: 23 mmol/L (ref 22–32)
Calcium: 8.4 mg/dL — ABNORMAL LOW (ref 8.9–10.3)
Chloride: 102 mmol/L (ref 98–111)
Creatinine, Ser: 0.59 mg/dL (ref 0.44–1.00)
GFR, Estimated: >60 mL/min (ref >60)
Glucose, Bld: 110 mg/dL — ABNORMAL HIGH (ref 70–99)
Potassium: 3.8 mmol/L (ref 3.5–5.1)
Sodium: 135 mmol/L (ref 135–145)
Total Bilirubin: 2.4 mg/dL — ABNORMAL HIGH (ref 0.3–1.2)
Total Protein: 7.5 g/dL (ref 6.5–8.1)

## 2020-12-09 LAB — TROPONIN I (HIGH SENSITIVITY)
Troponin I (High Sensitivity): 11 ng/L (ref <18)
Troponin I (High Sensitivity): 10 ng/L (ref <18)

## 2020-12-09 LAB — SARS CORONAVIRUS 2 BY RT PCR (HOSPITAL ORDER, PERFORMED IN ~~LOC~~ HOSPITAL LAB): SARS Coronavirus 2: NEGATIVE

## 2020-12-09 LAB — BRAIN NATRIURETIC PEPTIDE: B Natriuretic Peptide: 95 pg/mL (ref 0.0–100.0)

## 2020-12-09 MED ORDER — IOHEXOL 350 MG/ML SOLN
100.0000 mL | Freq: Once | INTRAVENOUS | Status: AC | PRN
  Administered 2020-12-10: 100 mL via INTRAVENOUS

## 2020-12-09 MED ORDER — SODIUM CHLORIDE 0.9 % IV SOLN
1.0000 g | Freq: Once | INTRAVENOUS | Status: AC
  Administered 2020-12-09: 1 g via INTRAVENOUS
  Filled 2020-12-09: qty 10

## 2020-12-09 MED ORDER — ALBUTEROL SULFATE HFA 108 (90 BASE) MCG/ACT IN AERS
2.0000 | INHALATION_SPRAY | RESPIRATORY_TRACT | Status: DC | PRN
  Filled 2020-12-09: qty 6.7

## 2020-12-09 MED ORDER — FUROSEMIDE 10 MG/ML IJ SOLN
20.0000 mg | Freq: Once | INTRAMUSCULAR | Status: AC
  Administered 2020-12-09: 20 mg via INTRAVENOUS
  Filled 2020-12-09: qty 2

## 2020-12-09 NOTE — ED Provider Notes (Signed)
Emergency Department Provider Note   I have reviewed the triage vital signs and the nursing notes.   HISTORY  Chief Complaint Shortness of Breath   HPI Lori Cooper is a 65 y.o. female with past medical history reviewed below but no prior history of hypoxic respiratory failure presents emergency department shortness of breath gradually worsening over the past 7 days.  She describes cough with increased work of breathing.  Her only position of comfort is laying on her left side.  She notes that in the past 24 hours she has been so short of breath she has been mainly staying in bed.  She does not recall having fevers.  She is not having diarrhea or vomiting.  She is not having chest pain.  She has an open wound to her abdomen which is healing well with dressing in place per patient. No appreciated LE edema.    EMS arrived to find the patient hypoxemic with room air sats of 70%.  They transitioned her from nasal cannula initially to nonrebreather.  On arrival to the ED nursing staff transition patient back to 4 L nasal cannula and she is holding her sats there.    Past Medical History:  Diagnosis Date  . Anxiety   . Chronic back pain   . Closed fracture of left olecranon process 08/11/2020  . DDD (degenerative disc disease), cervical   . DDD (degenerative disc disease), lumbosacral   . Depression   . GERD (gastroesophageal reflux disease)   . Hiatal hernia   . History of adenomatous polyp of colon    2009  tubular adenoma  . History of esophagitis   . History of idiopathic seizure    1984  x1 after vaginal delivery (per pt negative work-up and no issue since)  . History of left shoulder fracture    01/ 2014  proximal humerus fx  . Hyperlipidemia   . Left ulnar fracture 08/11/2020  . OA (osteoarthritis)    knees and thumbs  . RLS (restless legs syndrome)   . Supraumbilical hernia   . Umbilical hernia   . Wears dentures    lower    Patient Active Problem List   Diagnosis  Date Noted  . Prolonged QT interval 12/10/2020  . Diaphragmatic hernia 12/10/2020  . Aortic atherosclerosis ([place]) 12/10/2020  . Pleural effusion on right 12/09/2020  . Class 1 obesity 12/09/2020  . Left ulnar fracture 08/11/2020  . Closed fracture of left olecranon process 08/11/2020  . Traumatic injury of small intestine 08/08/2020  . History of pulmonary embolus (PE) 08/12/2018  . Controlled substance agreement signed 08/12/2018  . Benzodiazepine dependence ([place]) 08/12/2018  . Positive colorectal cancer screening using Cologuard test 07/13/2018  . DOE (dyspnea on exertion) 03/10/2018  . Upper airway cough syndrome 03/10/2018  . Metabolic syndrome [id number]  . Arthritis 07/16/2015  . Depression 12/21/2014  . Supraumbilical hernia  [id number]  . Incisional hernias x 3 s/p laparoscopic repair with mesh 12/31/2016 04/02/2014  . Personal history of colonic polyps 04/02/2014  . Morbid obesity due to excess calories ([place]) complicated by hyperlipidemia/ preDM 02/13/2014  . Prediabetes 09/21/2013  . Vitamin D deficiency 06/23/2013  . HLD (hyperlipidemia) 02/20/2013  . GERD (gastroesophageal reflux disease) 02/20/2013  . Generalized anxiety disorder 02/20/2013  . Restless legs 02/20/2013    Past Surgical History:  Procedure Laterality Date  . APPLICATION OF WOUND VAC N/A 08/08/2020   Procedure: APPLICATION OF WOUND VAC;  Surgeon: Kinsinger, Arta Bruce, MD;  Location: [facility]  OR;  Service: General;  Laterality: N/A;  . CARDIOVASCULAR STRESS TEST  05/06/2010   Lexiscan nuclear study w/ no exercise/  probably normal , per images there is a large reversible defect in the mid to distal anterior wall, this seems to be shifting breast attenuation/  normal LV function and wall motion , ef 67%  . CARPAL TUNNEL RELEASE Bilateral 2011   excision ganglion cyst left wrist  . CATARACT EXTRACTION W/ INTRAOCULAR LENS  IMPLANT, BILATERAL  09 and 10/ 2017  . COLONOSCOPY  05/08/2008  . COLONOSCOPY N/A  09/15/2018   Procedure: COLONOSCOPY;  Surgeon: Rogene Houston, MD;  [facility];  Service: Endoscopy;  Laterality: N/A;  12:00  . LAPAROSCOPIC CHOLECYSTECTOMY  2012  . LAPAROTOMY N/A 08/08/2020   Procedure: EXPLORATORY LAPAROTOMY, ILEOCECECTOMY WITH ANASTOMOSIS, MECKELS RESSECTION;  Surgeon: Kinsinger, Arta Bruce, MD;  Location: [place];  Service: General;  Laterality: N/A;  . ORIF ELBOW FRACTURE Left 08/10/2020   Procedure: OPEN REDUCTION INTERNAL FIXATION (ORIF) OLECRANON and ULNA FRACTURE;  Surgeon: Altamese Centerville, MD;  Location: [place];  Service: Orthopedics;  Laterality: Left;  . SHOULDER ARTHROSCOPY/  ACROMIOPLASTY/  DISTAL CLAVICAL RESECTION/  DEBRIDEMENT LABRAL TEAR Left 02/09/2005  . TONSILLECTOMY AND ADENOIDECTOMY  age 63  . TUBAL LIGATION Bilateral yrs ago  . VENTRAL HERNIA REPAIR N/A 12/31/2016   Procedure: LAPAROSCOPIC VENTRAL WALL HERNIA REPAIR WITH ERAS PATHWAY;  Surgeon: Michael Boston, MD;  Location: [place];  Service: General;  Laterality: N/A;    Allergies Patient has no known allergies.  Family History  Problem Relation Age of Onset  . Hypertension Mother   . Kidney disease Mother   . Diabetes Mother   . Pulmonary embolism Father 86  . Obesity Sister     Social History Social History   Tobacco Use  . Smoking status: Former Smoker    Packs/day: 0.50    Years: 40.00    Pack years: 20.00    Types: Cigarettes    Start date: 02/20/1973    Quit date: 06/16/2013    Years since quitting: 7.4  . Smokeless tobacco: Never Used  Vaping Use  . Vaping Use: Former  Substance Use Topics  . Alcohol use: No  . Drug use: No    Review of Systems  Constitutional: No fever/chills Eyes: No visual changes. ENT: No sore throat. Cardiovascular: Denies chest pain. Respiratory: Positive shortness of breath. Gastrointestinal: No abdominal pain.  No nausea, no vomiting.  No diarrhea.  No constipation. Genitourinary: Negative for  dysuria. Musculoskeletal: Negative for back pain. Skin: Negative for rash. Neurological: Negative for headaches, focal weakness or numbness.  10-point ROS otherwise negative.  ____________________________________________   PHYSICAL EXAM:  VITAL SIGNS: ED Triage Vitals  Enc Vitals Group     BP 12/09/20 2022 127/62     Pulse Rate 12/09/20 2022 89     Resp 12/09/20 2022 (!) 22     Temp 12/09/20 2027 98.6 F (37 C)     Temp Source 12/09/20 2022 Oral     SpO2 12/09/20 2022 100 %     Weight 12/09/20 2023 200 lb (90.7 kg)     Height 12/09/20 2023 '5\' 5"'$  (1.651 m)   Constitutional: Alert and oriented.  Patient with some increased work of breathing noted on my initial assessment. Able to provide a full history.  Eyes: Conjunctivae are normal.  Head: Atraumatic. Nose: No congestion/rhinnorhea. Mouth/Throat: Mucous membranes are moist. Neck: No stridor.  Cardiovascular: Normal rate, regular rhythm.  Good peripheral circulation. Grossly normal heart sounds.   Respiratory: Normal respiratory effort.  No retractions. Lungs diminished at the right base. No wheezing. No rhonchi.  Gastrointestinal: Soft and nontender. No distention.  Musculoskeletal: No lower extremity tenderness nor edema. No gross deformities of extremities. Neurologic:  Normal speech and language.  Skin:  Skin is warm, dry and intact. No rash noted.  ____________________________________________   LABS (all labs ordered are listed, but only abnormal results are displayed)  Labs Reviewed  CBC WITH DIFFERENTIAL/PLATELET - Abnormal; Notable for the following components:      Result Value   WBC 11.2 (*)    MCV 79.9 (*)    RDW 20.0 (*)    Neutro Abs 8.2 (*)    All other components within normal limits  COMPREHENSIVE METABOLIC PANEL - Abnormal; Notable for the following components:   Glucose, Bld 110 (*)    Calcium 8.4 (*)    Albumin 2.6 (*)    AST 274 (*)    ALT 116 (*)    Alkaline Phosphatase 147 (*)    Total  Bilirubin 2.4 (*)    All other components within normal limits  CBC WITH DIFFERENTIAL/PLATELET - Abnormal; Notable for the following components:   WBC 11.4 (*)    Hemoglobin 11.1 (*)    HCT 33.1 (*)    MCV 79.6 (*)    RDW 19.9 (*)    Neutro Abs 8.2 (*)    All other components within normal limits  COMPREHENSIVE METABOLIC PANEL - Abnormal; Notable for the following components:   Potassium 3.4 (*)    Calcium 8.1 (*)    Total Protein 6.4 (*)    Albumin 2.3 (*)    AST 226 (*)    ALT 96 (*)    Alkaline Phosphatase 127 (*)    Total Bilirubin 1.7 (*)    All other components within normal limits  ALBUMIN - Abnormal; Notable for the following components:   Albumin 2.4 (*)    All other components within normal limits  LACTATE DEHYDROGENASE - Abnormal; Notable for the following components:   LDH 432 (*)    All other components within normal limits  SARS CORONAVIRUS 2 BY RT PCR (HOSPITAL ORDER, [facility])  BRAIN NATRIURETIC PEPTIDE  MAGNESIUM  PHOSPHORUS  PROCALCITONIN  PROCALCITONIN  MAGNESIUM  LACTATE DEHYDROGENASE, PLEURAL OR PERITONEAL FLUID  HEMOGLOBIN A1C  CBG MONITORING, ED  TROPONIN I (HIGH SENSITIVITY)  TROPONIN I (HIGH SENSITIVITY)   ____________________________________________  EKG   EKG Interpretation  Date/Time:  Monday December 09 2020 20:29:24 EST Ventricular Rate:  88 PR Interval:    QRS Duration: 76 QT Interval:  465 QTC Calculation: 563 R Axis:   82 Text Interpretation: Sinus rhythm Borderline right axis deviation Low voltage, precordial leads Nonspecific T abnormalities, anterior leads Prolonged QT interval No STEMI Confirmed by Nanda Quinton [phone]) on 12/09/2020 8:58:30 PM       ____________________________________________  RADIOLOGY  CT Angio Chest PE W and/or Wo Contrast  Result Date: 12/10/2020 CLINICAL DATA:  PE suspected.  Shortness of breath. EXAM: CT ANGIOGRAPHY CHEST WITH CONTRAST TECHNIQUE: Multidetector CT  imaging of the chest was performed using the standard protocol during bolus administration of intravenous contrast. Multiplanar CT image reconstructions and MIPs were obtained to evaluate the vascular anatomy. CONTRAST:  152m OMNIPAQUE IOHEXOL 350 MG/ML SOLN COMPARISON:  08/08/2020 FINDINGS: Cardiovascular: Contrast injection is sufficient to demonstrate satisfactory opacification of the pulmonary arteries to the segmental level. There is no  pulmonary embolus or evidence of right heart strain. The size of the main pulmonary artery is normal. Heart size is normal, with no pericardial effusion. There are atherosclerotic changes of the thoracic aorta without evidence for dissection or aneurysm. Mediastinum/Nodes: -- No mediastinal lymphadenopathy. -- No hilar lymphadenopathy. -- No axillary lymphadenopathy. -- No supraclavicular lymphadenopathy. -- Normal thyroid gland where visualized. -  Unremarkable esophagus. Lungs/Pleura: There is a large right-sided pleural effusion with near complete collapse of the right lower lobe. There is no pneumothorax. There is shift of the mediastinum to the patient's left. There is a trace left-sided pleural effusion. There is atelectasis at the left lung base. There is an apparent large defect in the diaphragm laterally on the right (axial series 6, image 81). There is fluid tracking into the patient's right flank and upper abdomen. Upper Abdomen: Contrast bolus timing is not optimized for evaluation of the abdominal organs. There is a small volume of free fluid in the patient's upper abdomen. Musculoskeletal: There is a healing sternal body fracture. There is a fracture of the posterior eighth rib on the right with evidence for nonunion. Review of the MIP images confirms the above findings. IMPRESSION: 1. Large right-sided pleural effusion with near complete collapse of the right lower lobe and shift of the mediastinum to the patient's left. 2. Apparent large defect in the diaphragm  laterally on the right. Surgical consultation is recommended. 3. Small volume free fluid in the patient's upper abdomen. 4. Healing sternal body fracture. 5. Subacute fracture of the posterior eighth rib on the right with evidence for nonunion. Aortic Atherosclerosis (ICD10-I70.0). Electronically Signed   By: Constance Holster M.D.   On: 12/10/2020 00:52   DG Chest Portable 1 View  Result Date: 12/09/2020 CLINICAL DATA:  Shortness of breath EXAM: PORTABLE CHEST 1 VIEW COMPARISON:  08/19/2020 FINDINGS: Cardiomegaly with aortic atherosclerosis. Probable right pleural effusion. Dense airspace disease at the right middle lobe and right base. Vascular congestion. No pneumothorax. IMPRESSION: 1. Cardiomegaly with vascular congestion. 2. Probable right pleural effusion with increased dense airspace disease at the right middle lobe and right base, atelectasis versus pneumonia. Electronically Signed   By: Donavan Foil M.D.   On: 12/09/2020 21:06    ____________________________________________   PROCEDURES  Procedure(s) performed:   Procedures  CRITICAL CARE Performed by: Margette Fast Total critical care time: 35 minutes Critical care time was exclusive of separately billable procedures and treating other patients. Critical care was necessary to treat or prevent imminent or life-threatening deterioration. Critical care was time spent personally by me on the following activities: development of treatment plan with patient and/or surrogate as well as nursing, discussions with consultants, evaluation of patient's response to treatment, examination of patient, obtaining history from patient or surrogate, ordering and performing treatments and interventions, ordering and review of laboratory studies, ordering and review of radiographic studies, pulse oximetry and re-evaluation of patient's condition.  Nanda Quinton, MD Emergency Medicine  ____________________________________________   INITIAL  IMPRESSION / ASSESSMENT AND PLAN / ED COURSE  Pertinent labs & imaging results that were available during my care of the patient were reviewed by me and considered in my medical decision making (see chart for details).   Patient presents emergency department for evaluation of shortness of breath with diminished lung sounds on the right and hypoxemia.  Pleural effusion versus consolidative process/pneumonia are higher on my differential.  Lower suspicion for PE/ACS.  Covid also a consideration.  PCR test has been ordered.  Patient comfortable on  4 L nasal cannula but will continue to monitor closely. CXR pending.   Labs reviewed. Troponin negative. COVID negative. Discussed case with Dr. Olevia Bowens. Wound like to add CTA chest for [facility] to follow. Patient comfortable.   Discussed patient's case with [facility], Dr. Olevia Bowens to request admission. Patient and family (if present) updated with plan. Care transferred to [facility] service.  I reviewed all nursing notes, vitals, pertinent old records, EKGs, labs, imaging (as available).  ____________________________________________  FINAL CLINICAL IMPRESSION(S) / ED DIAGNOSES  Final diagnoses:  Acute respiratory failure with hypoxia (HCC)  Prolonged Q-T interval on ECG  Pleural effusion     MEDICATIONS GIVEN DURING THIS VISIT:  Medications  albuterol (VENTOLIN HFA) 108 (90 Base) MCG/ACT inhaler 2 puff (has no administration in time range)  acetaminophen (TYLENOL) tablet 650 mg (has no administration in time range)    Or  acetaminophen (TYLENOL) suppository 650 mg (has no administration in time range)  prochlorperazine (COMPAZINE) injection 5 mg (has no administration in time range)  oxyCODONE (Oxy IR/ROXICODONE) immediate release tablet 5 mg (has no administration in time range)  cefTRIAXone (ROCEPHIN) 1 g in sodium chloride 0.9 % 100 mL IVPB (0 g Intravenous Stopped 12/10/20 1110)  doxycycline (VIBRA-TABS) tablet 100 mg (100 mg Oral Given 12/10/20 1036)  potassium  chloride 10 mEq in 100 mL IVPB (has no administration in time range)  atorvastatin (LIPITOR) tablet 40 mg (has no administration in time range)  FLUoxetine (PROZAC) capsule 80 mg (has no administration in time range)  famotidine (PEPCID) tablet 20 mg (has no administration in time range)  pantoprazole (PROTONIX) EC tablet 40 mg (has no administration in time range)  insulin aspart (novoLOG) injection 0-9 Units (0 Units Subcutaneous Not Given 12/10/20 1148)  cefTRIAXone (ROCEPHIN) 1 g in sodium chloride 0.9 % 100 mL IVPB (0 g Intravenous Stopped 12/09/20 2302)  furosemide (LASIX) injection 20 mg (20 mg Intravenous Given 12/09/20 2213)  iohexol (OMNIPAQUE) 350 MG/ML injection 100 mL (100 mLs Intravenous Contrast Given 12/10/20 0010)  magnesium sulfate IVPB 2 g 50 mL (0 g Intravenous Stopped 12/10/20 0146)    Note:  This document was prepared using Dragon voice recognition software and may include unintentional dictation errors.  Nanda Quinton, MD, [facility] Emergency Medicine    Johnmatthew Solorio, [facility], MD 12/10/20 1200

## 2020-12-09 NOTE — H&P (Addendum)
History and Physical    Lori Cooper [id number] DOB: 1956-06-20 DOA: 12/09/2020  PCP: Sharion Balloon, FNP  Patient coming from: Home.  I have personally briefly reviewed patient's old medical records in [place]  Chief Complaint: Shortness of breath.  HPI: Lori Cooper is a 65 y.o. female with medical history significant of anxiety, depression, chronic back pain, postlaminectomy pain of cervical and lumbar spine, closed fracture history of left shoulder and left ulnar (olecranon process) fracture, history of esophagitis, GERD/hiatal hernia, adenomatous colon polyp, history of idiopathic seizure, hyperlipidemia, hyperlipidemia, osteoarthritis, restless syndrome, supraumbilical hernia, umbilical hernia who is coming to the emergency department due to progressively worse dyspnea for the past week associated with nonproductive cough, decreased appetite, fatigue for the past week.  She denies fever, chills, sore throat, rhinorrhea, wheezing or hemoptysis.  No chest pain, palpitations, dizziness, diaphoresis, PND or lower extremity edema.  The patient states that she has trouble lying down on her right side and her only comfortable positions are semiseated position or lying down on her left side.  She denies emesis, nausea, diarrhea, constipation, melena or hematochezia.  No dysuria, frequency or hematuria.  No polyuria, polydipsia, polyphagia or blurred vision.  ED Course: Initial vital signs were temperature 98.6 F, pulse was 89, respirations 22, BP 127/62 mmHg and O2 sat 100% on NRB oxygen, but this was subsequently tapered down to 4 LPM via [place].  Labwork: CBC showed a white count of 11.2, hemoglobin 12.5 g/dL and platelets 349.  BNP, troponin x2, and phosphorus were normal.  Although magnesium was suboptimal in the presence of prolonged QT at 1.8 mg/dL.  CMP showed normal electrolytes when calcium was corrected to albumin.  Renal function was normal.  Glucose was 110 and total  bilirubin was 2.4 mg/dL.  Total protein 7.5, albumin 2.6, AST 274, ALT 116 and alkaline phosphatase 147 units/L.  Imaging: A portable chest radiograph showed cardiomegaly with vascular congestion.  There were probably right pleural effusion with increased dense airspace disease in the right middle lobe and right base, atelectasis versus pneumonia.  CTA chest showed a large right-sided pleural effusion with near complete collapse of the right lower lobe and shift to the left of the patient's mediastinum.  There is an apparent large defect in the diaphragm laterally on the right and surgical consultation is recommended.  Small volume free fluid in the patient's upper abdomen.  Healing sternal body fracture.  Superacute fracture of the posterior a rib on the right with evidence for nonunion.  There was aortic atherosclerosis.  Review of Systems: As per HPI otherwise all other systems reviewed and are negative.  Past Medical History:  Diagnosis Date  . Anxiety   . Chronic back pain   . Closed fracture of left olecranon process 08/11/2020  . DDD (degenerative disc disease), cervical   . DDD (degenerative disc disease), lumbosacral   . Depression   . GERD (gastroesophageal reflux disease)   . Hiatal hernia   . History of adenomatous polyp of colon    2009  tubular adenoma  . History of esophagitis   . History of idiopathic seizure    1984  x1 after vaginal delivery (per pt negative work-up and no issue since)  . History of left shoulder fracture    01/ 2014  proximal humerus fx  . Hyperlipidemia   . Left ulnar fracture 08/11/2020  . OA (osteoarthritis)    knees and thumbs  . RLS (restless legs syndrome)   .  Supraumbilical hernia   . Umbilical hernia   . Wears dentures    lower    Past Surgical History:  Procedure Laterality Date  . APPLICATION OF WOUND VAC N/A 08/08/2020   Procedure: APPLICATION OF WOUND VAC;  Surgeon: Mickeal Skinner, MD;  Location: [facility];  Service: General;   Laterality: N/A;  . CARDIOVASCULAR STRESS TEST  05/06/2010   Lexiscan nuclear study w/ no exercise/  probably normal , per images there is a large reversible defect in the mid to distal anterior wall, this seems to be shifting breast attenuation/  normal LV function and wall motion , ef 67%  . CARPAL TUNNEL RELEASE Bilateral 2011   excision ganglion cyst left wrist  . CATARACT EXTRACTION W/ INTRAOCULAR LENS  IMPLANT, BILATERAL  09 and 10/ 2017  . COLONOSCOPY  05/08/2008  . COLONOSCOPY N/A 09/15/2018   Procedure: COLONOSCOPY;  Surgeon: Rogene Houston, MD;  [facility];  Service: Endoscopy;  Laterality: N/A;  12:00  . LAPAROSCOPIC CHOLECYSTECTOMY  2012  . LAPAROTOMY N/A 08/08/2020   Procedure: EXPLORATORY LAPAROTOMY, ILEOCECECTOMY WITH ANASTOMOSIS, MECKELS RESSECTION;  Surgeon: Kinsinger, Arta Bruce, MD;  Location: [facility];  Service: General;  Laterality: N/A;  . ORIF ELBOW FRACTURE Left 08/10/2020   Procedure: OPEN REDUCTION INTERNAL FIXATION (ORIF) OLECRANON and ULNA FRACTURE;  Surgeon: Altamese Petersburg, MD;  Location: [facility];  Service: Orthopedics;  Laterality: Left;  . SHOULDER ARTHROSCOPY/  ACROMIOPLASTY/  DISTAL CLAVICAL RESECTION/  DEBRIDEMENT LABRAL TEAR Left 02/09/2005  . TONSILLECTOMY AND ADENOIDECTOMY  age 57  . TUBAL LIGATION Bilateral yrs ago  . VENTRAL HERNIA REPAIR N/A 12/31/2016   Procedure: LAPAROSCOPIC VENTRAL WALL HERNIA REPAIR WITH ERAS PATHWAY;  Surgeon: Michael Boston, MD;  Location: [facility];  Service: General;  Laterality: N/A;    Social History  reports that she quit smoking about 7 years ago. Her smoking use included cigarettes. She started smoking about 47 years ago. She has a 20.00 pack-year smoking history. She has never used smokeless tobacco. She reports that she does not drink alcohol and does not use drugs.  No Known Allergies  Family History  Problem Relation Age of Onset  . Hypertension Mother   . Kidney disease Mother   . Diabetes  Mother   . Pulmonary embolism Father 21  . Obesity Sister    Prior to Admission medications   Medication Sig Start Date End Date Taking? Authorizing Provider  albuterol (PROVENTIL HFA;VENTOLIN HFA) 108 (90 Base) MCG/ACT inhaler Inhale 2 puffs into the lungs every 6 (six) hours as needed for wheezing or shortness of breath.    [provider]  apixaban (ELIQUIS) 5 MG TABS tablet Take 1 tablet (5 mg total) by mouth 2 (two) times daily. Patient taking differently: Take 5 mg by mouth every morning. 05/28/20   Evelina Dun A, FNP  aspirin EC 81 MG tablet Take 81 mg by mouth every morning.  Patient not taking: Reported on 12/03/2020    [provider]  atorvastatin (LIPITOR) 40 MG tablet Take 1 tablet by mouth once daily 08/05/20   Evelina Dun A, FNP  budesonide-formoterol (SYMBICORT) 80-4.5 MCG/ACT inhaler Inhale 2 puffs by mouth twice daily Patient taking differently: Inhale 2 puffs into the lungs 2 (two) times daily. 08/01/19   Sharion Balloon, FNP  buPROPion (WELLBUTRIN XL) 150 MG 24 hr tablet Take 1 tablet by mouth once daily Patient taking differently: Take 150 mg by mouth every morning. 07/08/20   Sharion Balloon,  FNP  busPIRone (BUSPAR) 5 MG tablet Take 1 tablet (5 mg total) by mouth 3 (three) times daily as needed. 12/04/19   Evelina Dun A, FNP  Cholecalciferol (VITAMIN D) 125 MCG (5000 UT) CAPS Take 1 capsule by mouth daily. 08/23/20   Ainsley Spinner, PA-C  cyclobenzaprine (FLEXERIL) 10 MG tablet Take 1 tablet by mouth three times daily as needed for muscle spasm Patient taking differently: Take 10 mg by mouth at bedtime as needed for muscle spasms. 05/02/20   Evelina Dun A, FNP  diclofenac Sodium (VOLTAREN) 1 % GEL Apply 1 application topically 2 (two) times daily as needed (pain).    [provider]  famotidine (PEPCID) 20 MG tablet Take 1 tablet (20 mg total) by mouth at bedtime. 03/01/20   Sharion Balloon, FNP  FLUoxetine (PROZAC) 40 MG capsule Take 2  capsules (80 mg total) by mouth daily. 12/05/20   Sharion Balloon, FNP  Multiple Vitamin (MULTIVITAMIN WITH MINERALS) TABS tablet Take 1 tablet by mouth every morning.    [provider]  oxyCODONE (OXY IR/ROXICODONE) 5 MG immediate release tablet Take 1-2 tablets (5-10 mg total) by mouth every 4 (four) hours as needed for moderate pain or severe pain ('5mg'$  moderate, '10mg'$  severe). 08/22/20   Saverio Danker, PA-C  pantoprazole (PROTONIX) 20 MG tablet Take 1 tablet by mouth once daily Patient taking differently: Take 20 mg by mouth every morning. 07/08/20   Sharion Balloon, FNP    Physical Exam: Vitals:   12/09/20 2130 12/09/20 2200 12/09/20 2230 12/09/20 2300  BP: (!) 119/58 (!) 125/54 (!) 122/52 (!) 121/52  Pulse: 82 85  86  Resp: '19 20 20 20  '$ Temp:      TempSrc:      SpO2: 93% 95%  99%  Weight:      Height:        Constitutional: Looks chronically ill. Eyes: PERRL, lids and conjunctivae normal ENMT: Mucous membranes and lips are dry. Posterior pharynx clear of any exudate or lesions. Neck: normal, supple, no masses, no thyromegaly Respiratory: clear to auscultation bilaterally, no wheezing, no crackles. Normal respiratory effort. No accessory muscle use.  Cardiovascular: Regular rate and rhythm, no murmurs / rubs / gallops. No extremity edema. 2+ pedal pulses. No carotid bruits.  Abdomen: Obese, healing midline surgical wound with no significant erythema or discharge, bowel sounds positive.  Soft, no tenderness, but describes a pressure sensation on palpation of the RUQ, no masses palpated. No hepatosplenomegaly. Musculoskeletal: Mild generalized weakness.  No clubbing / cyanosis.  Good ROM, no contractures. Normal muscle tone.  Skin: Healing abdominal midline surgical wound.  Please see picture below. Neurologic: CN 2-12 grossly intact. Sensation intact, DTR normal. Strength 5/5 in all 4.  Psychiatric: Normal judgment and insight. Alert and oriented x 3. Normal mood.       Labs on Admission: I have personally reviewed following labs and imaging studies  CBC: Recent Labs  Lab 12/09/20 2037  WBC 11.2*  NEUTROABS 8.2*  HGB 12.5  HCT 37.7  MCV 79.9*  PLT 0000000    Basic Metabolic Panel: Recent Labs  Lab 12/09/20 2037  NA 135  K 3.8  CL 102  CO2 23  GLUCOSE 110*  BUN 9  CREATININE 0.59  CALCIUM 8.4*    GFR: Estimated Creatinine Clearance: 77 mL/min (by C-G formula based on SCr of 0.59 mg/dL).  Liver Function Tests: Recent Labs  Lab 12/09/20 2037  AST 274*  ALT 116*  ALKPHOS 147*  BILITOT  2.4*  PROT 7.5  ALBUMIN 2.6*   Radiological Exams on Admission: DG Chest Portable 1 View  Result Date: 12/09/2020 CLINICAL DATA:  Shortness of breath EXAM: PORTABLE CHEST 1 VIEW COMPARISON:  08/19/2020 FINDINGS: Cardiomegaly with aortic atherosclerosis. Probable right pleural effusion. Dense airspace disease at the right middle lobe and right base. Vascular congestion. No pneumothorax. IMPRESSION: 1. Cardiomegaly with vascular congestion. 2. Probable right pleural effusion with increased dense airspace disease at the right middle lobe and right base, atelectasis versus pneumonia. Electronically Signed   By: Donavan Foil M.D.   On: 12/09/2020 21:06    EKG: Independently reviewed.  Vent. rate 88 BPM PR interval * ms QRS duration 76 ms QT/QTc 465/563 ms P-R-T axes 12 82 * Sinus rhythm Borderline right axis deviation Low voltage, precordial leads Nonspecific T abnormalities, anterior leads Prolonged QT interval  Assessment/Plan Principal Problem:   Pleural effusion on right Observation/medical telemetry. Keep n.p.o. for now. Continue supplemental oxygen. Incentive spirometry as tolerated. Hold apixaban pending surgical evaluation. May need IR for therapeutic thoracentesis. Hold diuretics as the patient looks volume depleted. No fever and no significant leukocytosis Procalcitonin is only 0.20 ng/mL so will defer further  ABX.  Active Problems:   Diaphragmatic hernia As above. Consult General and thoracic surgery.    Prolonged QT interval Magnesium was supplemented. Keep electrolytes optimized. Avoid QT interval prolonging meds.    HLD (hyperlipidemia) Continue atorvastatin 40 mg p.o. daily.    GERD (gastroesophageal reflux disease) Continue famotidine 20 mg p.o. bedtime. Continue pantoprazole 20 mg p.o. daily.    Prediabetes  Currently NPO. Hemoglobin A 166.1% 4 months ago. CBG monitoring every 6 hours.    Class 1 obesity Has lost 35 pounds. Continue lifestyle modifications. Follow-up with PCP.    History of pulmonary embolus (PE) No PE on CTA. Will hold Eliquis pending surgical evaluation.    Depression Pending meds conciliation: Continue Wellbutrin 150 mg p.o. daily. Continue fluoxetine 40 mg p.o. daily.    DVT prophylaxis: On apixaban. Code Status:   Full code. Family Communication: Disposition Plan:   Patient is from:  Home.  Anticipated DC to:  Home.  Anticipated DC date:  12/12/2027 or 12/13/2027.  Anticipated DC barriers: Clinical status.  Consults called: Admission status:  Observation/telemetry.  High due to new right pleural effusion  Severity of Illness:  Reubin Milan MD [facility]  How to contact the [facility] Attending or Consulting provider [place] or covering provider during after hours [place], for this patient?   1. Check the care team in [facility] and look for a) attending/consulting [facility] provider listed and b) the [facility] team listed 2. Log into [email or web address] and use [place]'s universal password to access. If you do not have the password, please contact the hospital operator. 3. Locate the [facility] provider you are looking for under [facility] and page to a number that you can be directly reached. 4. If you still have difficulty reaching the provider, please page the [facility] (Director on Call) for the Hospitalists listed on amion for  assistance.  12/09/2020, 11:38 PM   This document was prepared using Dragon voice recognition software and may contain some unintended transcription errors.

## 2020-12-09 NOTE — ED Triage Notes (Signed)
Pt with increased SOB "since last weekend". Per EMS, sats on arrival were in 70's and they placed pt on O2 @ 2L [place] and sats improved but en route, sats decreased again and pt was having increased work of breathing so pt was placed on 100% NRB.

## 2020-12-09 NOTE — H&P (Incomplete)
History and Physical    Lori Cooper [id number] DOB: [birth year] DOA: 12/09/2020  PCP: Sharion Balloon, FNP  Patient coming from: Home.  I have personally briefly reviewed patient's old medical records in [place]  Chief Complaint: Shortness of breath.  HPI: Lori Cooper is a 65 y.o. 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  Past Medical History:  Diagnosis Date  . Anxiety   . Chronic back pain   . Closed fracture of left olecranon process 08/11/2020  . DDD (degenerative disc disease), cervical   . DDD (degenerative disc disease), lumbosacral   . Depression   . GERD (gastroesophageal reflux disease)   . Hiatal hernia   . History of adenomatous polyp of colon    2009  tubular adenoma  . History of esophagitis   . History of idiopathic seizure    1984  x1 after vaginal delivery (per pt negative work-up and no issue since)  . History of left shoulder fracture    01/ 2014  proximal humerus fx  . Hyperlipidemia   . Left ulnar fracture 08/11/2020  . OA (osteoarthritis)    knees and thumbs  . RLS (restless legs syndrome)   . Supraumbilical hernia   . Umbilical hernia   . Wears dentures    lower    Past Surgical History:  Procedure Laterality Date  . APPLICATION OF WOUND VAC N/A 08/08/2020   Procedure: APPLICATION OF WOUND VAC;  Surgeon: Mickeal Skinner, MD;  Location: [place];  Service: General;  Laterality: N/A;  . CARDIOVASCULAR STRESS TEST  05/06/2010   Lexiscan nuclear study w/ no exercise/  probably normal , per images there is a large reversible defect in the mid to distal anterior wall, this seems to be shifting breast attenuation/  normal LV function and wall motion , ef 67%  . CARPAL  TUNNEL RELEASE Bilateral 2011   excision ganglion cyst left wrist  . CATARACT EXTRACTION W/ INTRAOCULAR LENS  IMPLANT, BILATERAL  09 and 10/ 2017  . COLONOSCOPY  05/08/2008  . COLONOSCOPY N/A 09/15/2018   Procedure: COLONOSCOPY;  Surgeon: Rogene Houston, MD;  [facility];  Service: Endoscopy;  Laterality: N/A;  12:00  . LAPAROSCOPIC CHOLECYSTECTOMY  2012  . LAPAROTOMY N/A 08/08/2020   Procedure: EXPLORATORY LAPAROTOMY, ILEOCECECTOMY WITH ANASTOMOSIS, MECKELS RESSECTION;  Surgeon: Kinsinger, Arta Bruce, MD;  Location: [place];  Service: General;  Laterality: N/A;  . ORIF ELBOW FRACTURE Left 08/10/2020   Procedure: OPEN REDUCTION INTERNAL FIXATION (ORIF) OLECRANON and ULNA FRACTURE;  Surgeon: Altamese Pataskala, MD;  Location: [place];  Service: Orthopedics;  Laterality: Left;  . SHOULDER ARTHROSCOPY/  ACROMIOPLASTY/  DISTAL CLAVICAL RESECTION/  DEBRIDEMENT LABRAL TEAR Left 02/09/2005  . TONSILLECTOMY AND ADENOIDECTOMY  age 24  . TUBAL LIGATION Bilateral yrs ago  . VENTRAL HERNIA REPAIR N/A 12/31/2016   Procedure: LAPAROSCOPIC VENTRAL WALL HERNIA REPAIR WITH ERAS PATHWAY;  Surgeon: Michael Boston, MD;  Location: [place];  Service: General;  Laterality: N/A;    Social History  reports that she quit smoking about 7 years ago. Her smoking use included cigarettes. She started smoking about 47 years ago. She has a 20.00 pack-year smoking history. She has never used smokeless tobacco. She reports that she  does not drink alcohol and does not use drugs.  No Known Allergies  Family History  Problem Relation Age of Onset  . Hypertension Mother   . Kidney disease Mother   . Diabetes Mother   . Pulmonary embolism Father 70  . Obesity Sister    *** Unacceptable: Noncontributory, unremarkable, or negative. Acceptable: (example)Family history negative for heart disease  Prior to Admission medications   Medication Sig Start Date End Date Taking? Authorizing Provider  albuterol  (PROVENTIL HFA;VENTOLIN HFA) 108 (90 Base) MCG/ACT inhaler Inhale 2 puffs into the lungs every 6 (six) hours as needed for wheezing or shortness of breath.    [provider]  apixaban (ELIQUIS) 5 MG TABS tablet Take 1 tablet (5 mg total) by mouth 2 (two) times daily. Patient taking differently: Take 5 mg by mouth every morning. 05/28/20   Evelina Dun A, FNP  aspirin EC 81 MG tablet Take 81 mg by mouth every morning.  Patient not taking: Reported on 12/03/2020    [provider]  atorvastatin (LIPITOR) 40 MG tablet Take 1 tablet by mouth once daily 08/05/20   Evelina Dun A, FNP  budesonide-formoterol (SYMBICORT) 80-4.5 MCG/ACT inhaler Inhale 2 puffs by mouth twice daily Patient taking differently: Inhale 2 puffs into the lungs 2 (two) times daily. 08/01/19   Sharion Balloon, FNP  buPROPion (WELLBUTRIN XL) 150 MG 24 hr tablet Take 1 tablet by mouth once daily Patient taking differently: Take 150 mg by mouth every morning. 07/08/20   Sharion Balloon, FNP  busPIRone (BUSPAR) 5 MG tablet Take 1 tablet (5 mg total) by mouth 3 (three) times daily as needed. 12/04/19   Evelina Dun A, FNP  Cholecalciferol (VITAMIN D) 125 MCG (5000 UT) CAPS Take 1 capsule by mouth daily. 08/23/20   Ainsley Spinner, PA-C  cyclobenzaprine (FLEXERIL) 10 MG tablet Take 1 tablet by mouth three times daily as needed for muscle spasm Patient taking differently: Take 10 mg by mouth at bedtime as needed for muscle spasms. 05/02/20   Evelina Dun A, FNP  diclofenac Sodium (VOLTAREN) 1 % GEL Apply 1 application topically 2 (two) times daily as needed (pain).    [provider]  famotidine (PEPCID) 20 MG tablet Take 1 tablet (20 mg total) by mouth at bedtime. 03/01/20   Sharion Balloon, FNP  FLUoxetine (PROZAC) 40 MG capsule Take 2 capsules (80 mg total) by mouth daily. 12/05/20   Sharion Balloon, FNP  Multiple Vitamin (MULTIVITAMIN WITH MINERALS) TABS tablet Take 1 tablet by mouth every morning.    [provider]  oxyCODONE (OXY IR/ROXICODONE) 5 MG immediate release tablet Take 1-2 tablets (5-10 mg total) by mouth every 4 (four) hours as needed for moderate pain or severe pain ('5mg'$  moderate, '10mg'$  severe). 08/22/20   Saverio Danker, PA-C  pantoprazole (PROTONIX) 20 MG tablet Take 1 tablet by mouth once daily Patient taking differently: Take 20 mg by mouth every morning. 07/08/20   Sharion Balloon, FNP    Physical Exam: Vitals:   12/09/20 2130 12/09/20 2200 12/09/20 2230 12/09/20 2300  BP: (!) 119/58 (!) 125/54 (!) 122/52 (!) 121/52  Pulse: 82 85  86  Resp: '19 20 20 20  '$ Temp:      TempSrc:      SpO2: 93% 95%  99%  Weight:      Height:        Constitutional: NAD, calm, comfortable Vitals:   12/09/20 2130 12/09/20 2200 12/09/20 2230 12/09/20 2300  BP: Marland Kitchen)  119/58 (!) 125/54 (!) 122/52 (!) 121/52  Pulse: 82 85  86  Resp: '19 20 20 20  '$ Temp:      TempSrc:      SpO2: 93% 95%  99%  Weight:      Height:       Eyes: PERRL, lids and conjunctivae normal ENMT: Mucous membranes are moist. Posterior pharynx clear of any exudate or lesions.Normal dentition.  Neck: normal, supple, no masses, no thyromegaly Respiratory: clear to auscultation bilaterally, no wheezing, no crackles. Normal respiratory effort. No accessory muscle use.  Cardiovascular: Regular rate and rhythm, no murmurs / rubs / gallops. No extremity edema. 2+ pedal pulses. No carotid bruits.  Abdomen: no tenderness, no masses palpated. No hepatosplenomegaly. Bowel sounds positive.  Musculoskeletal: no clubbing / cyanosis. No joint deformity upper and lower extremities. Good ROM, no contractures. Normal muscle tone.  Skin: no rashes, lesions, ulcers. No induration Neurologic: CN 2-12 grossly intact. Sensation intact, DTR normal. Strength 5/5 in all 4.  Psychiatric: Normal judgment and insight. Alert and oriented x 3. Normal mood.   (Anything < 9 systems with 2 bullets each down codes to level 1) (If patient refuses exam can't  bill higher level) (Make sure to document decubitus ulcers present on admission -- if possible -- and whether patient has chronic indwelling catheter at time of admission)  Labs on Admission: I have personally reviewed following labs and imaging studies  CBC: Recent Labs  Lab 12/09/20 2037  WBC 11.2*  NEUTROABS 8.2*  HGB 12.5  HCT 37.7  MCV 79.9*  PLT 0000000    Basic Metabolic Panel: Recent Labs  Lab 12/09/20 2037  NA 135  K 3.8  CL 102  CO2 23  GLUCOSE 110*  BUN 9  CREATININE 0.59  CALCIUM 8.4*    GFR: Estimated Creatinine Clearance: 77 mL/min (by C-G formula based on SCr of 0.59 mg/dL).  Liver Function Tests: Recent Labs  Lab 12/09/20 2037  AST 274*  ALT 116*  ALKPHOS 147*  BILITOT 2.4*  PROT 7.5  ALBUMIN 2.6*   Radiological Exams on Admission: DG Chest Portable 1 View  Result Date: 12/09/2020 CLINICAL DATA:  Shortness of breath EXAM: PORTABLE CHEST 1 VIEW COMPARISON:  08/19/2020 FINDINGS: Cardiomegaly with aortic atherosclerosis. Probable right pleural effusion. Dense airspace disease at the right middle lobe and right base. Vascular congestion. No pneumothorax. IMPRESSION: 1. Cardiomegaly with vascular congestion. 2. Probable right pleural effusion with increased dense airspace disease at the right middle lobe and right base, atelectasis versus pneumonia. Electronically Signed   By: Donavan Foil M.D.   On: 12/09/2020 21:06    EKG: Independently reviewed.  Vent. rate 88 BPM PR interval * ms QRS duration 76 ms QT/QTc 465/563 ms P-R-T axes 12 82 * Sinus rhythm Borderline right axis deviation Low voltage, precordial leads Nonspecific T abnormalities, anterior leads Prolonged QT interval  Assessment/Plan Principal Problem:   Pleural effusion on right Active Problems:   HLD (hyperlipidemia)   GERD (gastroesophageal reflux disease)   Prediabetes   Depression   History of pulmonary embolus (PE)   Class 1 obesity    DVT prophylaxis: On  apixaban. Code Status:   Full code. Family Communication: Disposition Plan:   Patient is from:  Home.  Anticipated DC to:  Home.  Anticipated DC date:  12/12/2027 or 12/13/2027.  Anticipated DC barriers: Clinical status.  Consults called: Admission status:  Observation/telemetry.  High due to new right pleural effusion  Severity of Illness:  Reubin Milan MD  [facility]  How to contact the [facility] Attending or Consulting provider [place] or covering provider during after hours [place], for this patient?   1. Check the care team in [facility] and look for a) attending/consulting [facility] provider listed and b) the [facility] team listed 2. Log into [email or web address] and use [place]'s universal password to access. If you do not have the password, please contact the hospital operator. 3. Locate the [facility] provider you are looking for under [facility] and page to a number that you can be directly reached. 4. If you still have difficulty reaching the provider, please page the [facility] (Director on Call) for the Hospitalists listed on amion for assistance.  12/09/2020, 11:38 PM   This document was prepared using Dragon voice recognition software and may contain some unintended transcription errors.

## 2020-12-10 ENCOUNTER — Inpatient Hospital Stay (HOSPITAL_COMMUNITY): Payer: PPO

## 2020-12-10 ENCOUNTER — Encounter (HOSPITAL_COMMUNITY): Payer: Self-pay | Admitting: Family Medicine

## 2020-12-10 DIAGNOSIS — R7989 Other specified abnormal findings of blood chemistry: Secondary | ICD-10-CM | POA: Diagnosis not present

## 2020-12-10 DIAGNOSIS — D689 Coagulation defect, unspecified: Secondary | ICD-10-CM | POA: Diagnosis not present

## 2020-12-10 DIAGNOSIS — R188 Other ascites: Secondary | ICD-10-CM | POA: Diagnosis not present

## 2020-12-10 DIAGNOSIS — I700 Atherosclerosis of aorta: Secondary | ICD-10-CM | POA: Diagnosis not present

## 2020-12-10 DIAGNOSIS — J918 Pleural effusion in other conditions classified elsewhere: Secondary | ICD-10-CM | POA: Diagnosis not present

## 2020-12-10 DIAGNOSIS — E8809 Other disorders of plasma-protein metabolism, not elsewhere classified: Secondary | ICD-10-CM | POA: Diagnosis not present

## 2020-12-10 DIAGNOSIS — E875 Hyperkalemia: Secondary | ICD-10-CM | POA: Diagnosis present

## 2020-12-10 DIAGNOSIS — Z9689 Presence of other specified functional implants: Secondary | ICD-10-CM | POA: Diagnosis not present

## 2020-12-10 DIAGNOSIS — K225 Diverticulum of esophagus, acquired: Secondary | ICD-10-CM | POA: Diagnosis not present

## 2020-12-10 DIAGNOSIS — E871 Hypo-osmolality and hyponatremia: Secondary | ICD-10-CM | POA: Diagnosis not present

## 2020-12-10 DIAGNOSIS — K449 Diaphragmatic hernia without obstruction or gangrene: Secondary | ICD-10-CM

## 2020-12-10 DIAGNOSIS — W1830XA Fall on same level, unspecified, initial encounter: Secondary | ICD-10-CM | POA: Diagnosis not present

## 2020-12-10 DIAGNOSIS — Y92230 Patient room in hospital as the place of occurrence of the external cause: Secondary | ICD-10-CM | POA: Diagnosis not present

## 2020-12-10 DIAGNOSIS — X58XXXA Exposure to other specified factors, initial encounter: Secondary | ICD-10-CM | POA: Diagnosis not present

## 2020-12-10 DIAGNOSIS — K759 Inflammatory liver disease, unspecified: Secondary | ICD-10-CM | POA: Diagnosis not present

## 2020-12-10 DIAGNOSIS — R7401 Elevation of levels of liver transaminase levels: Secondary | ICD-10-CM | POA: Diagnosis not present

## 2020-12-10 DIAGNOSIS — K229 Disease of esophagus, unspecified: Secondary | ICD-10-CM | POA: Diagnosis not present

## 2020-12-10 DIAGNOSIS — I959 Hypotension, unspecified: Secondary | ICD-10-CM | POA: Diagnosis present

## 2020-12-10 DIAGNOSIS — N179 Acute kidney failure, unspecified: Secondary | ICD-10-CM | POA: Diagnosis not present

## 2020-12-10 DIAGNOSIS — R9431 Abnormal electrocardiogram [ECG] [EKG]: Secondary | ICD-10-CM | POA: Diagnosis not present

## 2020-12-10 DIAGNOSIS — Z452 Encounter for adjustment and management of vascular access device: Secondary | ICD-10-CM | POA: Diagnosis not present

## 2020-12-10 DIAGNOSIS — B3781 Candidal esophagitis: Secondary | ICD-10-CM | POA: Diagnosis not present

## 2020-12-10 DIAGNOSIS — J939 Pneumothorax, unspecified: Secondary | ICD-10-CM | POA: Diagnosis not present

## 2020-12-10 DIAGNOSIS — R933 Abnormal findings on diagnostic imaging of other parts of digestive tract: Secondary | ICD-10-CM | POA: Diagnosis not present

## 2020-12-10 DIAGNOSIS — K2970 Gastritis, unspecified, without bleeding: Secondary | ICD-10-CM | POA: Diagnosis not present

## 2020-12-10 DIAGNOSIS — E785 Hyperlipidemia, unspecified: Secondary | ICD-10-CM | POA: Diagnosis not present

## 2020-12-10 DIAGNOSIS — J90 Pleural effusion, not elsewhere classified: Secondary | ICD-10-CM | POA: Diagnosis not present

## 2020-12-10 DIAGNOSIS — R945 Abnormal results of liver function studies: Secondary | ICD-10-CM | POA: Diagnosis not present

## 2020-12-10 DIAGNOSIS — K767 Hepatorenal syndrome: Secondary | ICD-10-CM | POA: Diagnosis not present

## 2020-12-10 DIAGNOSIS — R0600 Dyspnea, unspecified: Secondary | ICD-10-CM | POA: Diagnosis not present

## 2020-12-10 DIAGNOSIS — K224 Dyskinesia of esophagus: Secondary | ICD-10-CM | POA: Diagnosis present

## 2020-12-10 DIAGNOSIS — Z20822 Contact with and (suspected) exposure to covid-19: Secondary | ICD-10-CM | POA: Diagnosis not present

## 2020-12-10 DIAGNOSIS — K766 Portal hypertension: Secondary | ICD-10-CM | POA: Diagnosis not present

## 2020-12-10 DIAGNOSIS — Z7189 Other specified counseling: Secondary | ICD-10-CM | POA: Diagnosis not present

## 2020-12-10 DIAGNOSIS — J690 Pneumonitis due to inhalation of food and vomit: Secondary | ICD-10-CM | POA: Diagnosis not present

## 2020-12-10 DIAGNOSIS — K219 Gastro-esophageal reflux disease without esophagitis: Secondary | ICD-10-CM | POA: Diagnosis not present

## 2020-12-10 DIAGNOSIS — E872 Acidosis: Secondary | ICD-10-CM | POA: Diagnosis not present

## 2020-12-10 DIAGNOSIS — Z86711 Personal history of pulmonary embolism: Secondary | ICD-10-CM | POA: Diagnosis not present

## 2020-12-10 DIAGNOSIS — R1319 Other dysphagia: Secondary | ICD-10-CM | POA: Diagnosis not present

## 2020-12-10 DIAGNOSIS — E222 Syndrome of inappropriate secretion of antidiuretic hormone: Secondary | ICD-10-CM | POA: Diagnosis present

## 2020-12-10 DIAGNOSIS — E669 Obesity, unspecified: Secondary | ICD-10-CM | POA: Diagnosis present

## 2020-12-10 DIAGNOSIS — J986 Disorders of diaphragm: Secondary | ICD-10-CM | POA: Diagnosis not present

## 2020-12-10 DIAGNOSIS — R0602 Shortness of breath: Secondary | ICD-10-CM | POA: Diagnosis not present

## 2020-12-10 DIAGNOSIS — E440 Moderate protein-calorie malnutrition: Secondary | ICD-10-CM | POA: Diagnosis not present

## 2020-12-10 DIAGNOSIS — Z4682 Encounter for fitting and adjustment of non-vascular catheter: Secondary | ICD-10-CM | POA: Diagnosis not present

## 2020-12-10 DIAGNOSIS — R1310 Dysphagia, unspecified: Secondary | ICD-10-CM | POA: Diagnosis not present

## 2020-12-10 DIAGNOSIS — F331 Major depressive disorder, recurrent, moderate: Secondary | ICD-10-CM | POA: Diagnosis not present

## 2020-12-10 DIAGNOSIS — J9811 Atelectasis: Secondary | ICD-10-CM | POA: Diagnosis not present

## 2020-12-10 DIAGNOSIS — F418 Other specified anxiety disorders: Secondary | ICD-10-CM | POA: Diagnosis not present

## 2020-12-10 DIAGNOSIS — Z6833 Body mass index (BMI) 33.0-33.9, adult: Secondary | ICD-10-CM | POA: Diagnosis not present

## 2020-12-10 DIAGNOSIS — K7290 Hepatic failure, unspecified without coma: Secondary | ICD-10-CM | POA: Diagnosis present

## 2020-12-10 DIAGNOSIS — Z515 Encounter for palliative care: Secondary | ICD-10-CM | POA: Diagnosis not present

## 2020-12-10 DIAGNOSIS — J948 Other specified pleural conditions: Secondary | ICD-10-CM | POA: Diagnosis present

## 2020-12-10 DIAGNOSIS — E782 Mixed hyperlipidemia: Secondary | ICD-10-CM | POA: Diagnosis not present

## 2020-12-10 DIAGNOSIS — E559 Vitamin D deficiency, unspecified: Secondary | ICD-10-CM | POA: Diagnosis not present

## 2020-12-10 DIAGNOSIS — K7581 Nonalcoholic steatohepatitis (NASH): Secondary | ICD-10-CM | POA: Diagnosis present

## 2020-12-10 DIAGNOSIS — J811 Chronic pulmonary edema: Secondary | ICD-10-CM | POA: Diagnosis not present

## 2020-12-10 DIAGNOSIS — R1314 Dysphagia, pharyngoesophageal phase: Secondary | ICD-10-CM | POA: Diagnosis present

## 2020-12-10 DIAGNOSIS — R918 Other nonspecific abnormal finding of lung field: Secondary | ICD-10-CM | POA: Diagnosis not present

## 2020-12-10 DIAGNOSIS — J9601 Acute respiratory failure with hypoxia: Secondary | ICD-10-CM | POA: Diagnosis not present

## 2020-12-10 DIAGNOSIS — K7460 Unspecified cirrhosis of liver: Secondary | ICD-10-CM | POA: Diagnosis present

## 2020-12-10 DIAGNOSIS — K769 Liver disease, unspecified: Secondary | ICD-10-CM | POA: Diagnosis not present

## 2020-12-10 LAB — CBC WITH DIFFERENTIAL/PLATELET
Abs Immature Granulocytes: 0.06 10*3/uL (ref 0.00–0.07)
Basophils Absolute: 0.1 10*3/uL (ref 0.0–0.1)
Basophils Relative: 1 %
Eosinophils Absolute: 0.2 10*3/uL (ref 0.0–0.5)
Eosinophils Relative: 2 %
HCT: 33.1 % — ABNORMAL LOW (ref 36.0–46.0)
Hemoglobin: 11.1 g/dL — ABNORMAL LOW (ref 12.0–15.0)
Immature Granulocytes: 1 %
Lymphocytes Relative: 17 %
Lymphs Abs: 1.9 10*3/uL (ref 0.7–4.0)
MCH: 26.7 pg (ref 26.0–34.0)
MCHC: 33.5 g/dL (ref 30.0–36.0)
MCV: 79.6 fL — ABNORMAL LOW (ref 80.0–100.0)
Monocytes Absolute: 0.9 10*3/uL (ref 0.1–1.0)
Monocytes Relative: 8 %
Neutro Abs: 8.2 10*3/uL — ABNORMAL HIGH (ref 1.7–7.7)
Neutrophils Relative %: 71 %
Platelets: 318 10*3/uL (ref 150–400)
RBC: 4.16 MIL/uL (ref 3.87–5.11)
RDW: 19.9 % — ABNORMAL HIGH (ref 11.5–15.5)
WBC: 11.4 10*3/uL — ABNORMAL HIGH (ref 4.0–10.5)
nRBC: 0 % (ref 0.0–0.2)

## 2020-12-10 LAB — PROTEIN, PLEURAL OR PERITONEAL FLUID: Total protein, fluid: <3 g/dL

## 2020-12-10 LAB — COMPREHENSIVE METABOLIC PANEL
ALT: 96 U/L — ABNORMAL HIGH (ref 0–44)
AST: 226 U/L — ABNORMAL HIGH (ref 15–41)
Albumin: 2.3 g/dL — ABNORMAL LOW (ref 3.5–5.0)
Alkaline Phosphatase: 127 U/L — ABNORMAL HIGH (ref 38–126)
Anion gap: 9 (ref 5–15)
BUN: 9 mg/dL (ref 8–23)
CO2: 24 mmol/L (ref 22–32)
Calcium: 8.1 mg/dL — ABNORMAL LOW (ref 8.9–10.3)
Chloride: 103 mmol/L (ref 98–111)
Creatinine, Ser: 0.62 mg/dL (ref 0.44–1.00)
GFR, Estimated: >60 mL/min (ref >60)
Glucose, Bld: 89 mg/dL (ref 70–99)
Potassium: 3.4 mmol/L — ABNORMAL LOW (ref 3.5–5.1)
Sodium: 136 mmol/L (ref 135–145)
Total Bilirubin: 1.7 mg/dL — ABNORMAL HIGH (ref 0.3–1.2)
Total Protein: 6.4 g/dL — ABNORMAL LOW (ref 6.5–8.1)

## 2020-12-10 LAB — BODY FLUID CELL COUNT WITH DIFFERENTIAL
Eos, Fluid: 0 %
Lymphs, Fluid: 70 %
Monocyte-Macrophage-Serous Fluid: 15 % — ABNORMAL LOW (ref 50–90)
Neutrophil Count, Fluid: 15 % (ref 0–25)
Other Cells, Fluid: REACTIVE %
Total Nucleated Cell Count, Fluid: 280 cu mm (ref 0–1000)

## 2020-12-10 LAB — GRAM STAIN: Gram Stain: NONE SEEN

## 2020-12-10 LAB — GLUCOSE, CAPILLARY
Glucose-Capillary: 62 mg/dL — ABNORMAL LOW (ref 70–99)
Glucose-Capillary: 76 mg/dL (ref 70–99)

## 2020-12-10 LAB — MAGNESIUM
Magnesium: 1.8 mg/dL (ref 1.7–2.4)
Magnesium: 2.2 mg/dL (ref 1.7–2.4)

## 2020-12-10 LAB — ALBUMIN: Albumin: 2.4 g/dL — ABNORMAL LOW (ref 3.5–5.0)

## 2020-12-10 LAB — GLUCOSE, PLEURAL OR PERITONEAL FLUID: Glucose, Fluid: 96 mg/dL

## 2020-12-10 LAB — HEMOGLOBIN A1C
Hgb A1c MFr Bld: 5.6 % (ref 4.8–5.6)
Mean Plasma Glucose: 114.02 mg/dL

## 2020-12-10 LAB — PROCALCITONIN
Procalcitonin: 0.2 ng/mL
Procalcitonin: 0.23 ng/mL

## 2020-12-10 LAB — PHOSPHORUS: Phosphorus: 3.1 mg/dL (ref 2.5–4.6)

## 2020-12-10 LAB — ALBUMIN, PLEURAL OR PERITONEAL FLUID: Albumin, Fluid: <1 g/dL

## 2020-12-10 LAB — CBG MONITORING, ED
Glucose-Capillary: 98 mg/dL (ref 70–99)
Glucose-Capillary: 88 mg/dL (ref 70–99)

## 2020-12-10 LAB — LACTATE DEHYDROGENASE: LDH: 432 U/L — ABNORMAL HIGH (ref 98–192)

## 2020-12-10 MED ORDER — INSULIN ASPART 100 UNIT/ML ~~LOC~~ SOLN: 0.0000 [IU] | SUBCUTANEOUS | Status: DC

## 2020-12-10 MED ORDER — POTASSIUM CHLORIDE 10 MEQ/100ML IV SOLN
10.0000 meq | INTRAVENOUS | Status: AC
  Administered 2020-12-10 (×6): 10 meq via INTRAVENOUS
  Filled 2020-12-10 (×6): qty 100

## 2020-12-10 MED ORDER — OXYCODONE HCL 5 MG PO TABS
5.0000 mg | ORAL_TABLET | ORAL | Status: DC | PRN
  Administered 2020-12-13 – 2021-01-09 (×14): 5 mg via ORAL
  Filled 2020-12-10 (×15): qty 1

## 2020-12-10 MED ORDER — ACETAMINOPHEN 325 MG PO TABS
650.0000 mg | ORAL_TABLET | Freq: Four times a day (QID) | ORAL | Status: DC | PRN
  Administered 2020-12-22 – 2021-01-03 (×2): 650 mg via ORAL
  Filled 2020-12-10 (×3): qty 2

## 2020-12-10 MED ORDER — FAMOTIDINE 20 MG PO TABS
20.0000 mg | ORAL_TABLET | Freq: Every day | ORAL | Status: DC
  Administered 2020-12-10 – 2021-01-10 (×32): 20 mg via ORAL
  Filled 2020-12-10 (×32): qty 1

## 2020-12-10 MED ORDER — SODIUM CHLORIDE 0.9 % IV SOLN
1.0000 g | INTRAVENOUS | Status: DC
  Administered 2020-12-10 – 2020-12-12 (×3): 1 g via INTRAVENOUS
  Filled 2020-12-10 (×3): qty 10

## 2020-12-10 MED ORDER — MAGNESIUM SULFATE 2 GM/50ML IV SOLN
2.0000 g | Freq: Once | INTRAVENOUS | Status: AC
  Administered 2020-12-10: 2 g via INTRAVENOUS
  Filled 2020-12-10: qty 50

## 2020-12-10 MED ORDER — PANTOPRAZOLE SODIUM 40 MG PO TBEC
40.0000 mg | DELAYED_RELEASE_TABLET | Freq: Every morning | ORAL | Status: DC
  Administered 2020-12-10 – 2021-01-10 (×26): 40 mg via ORAL
  Filled 2020-12-10 (×30): qty 1

## 2020-12-10 MED ORDER — PROCHLORPERAZINE EDISYLATE 10 MG/2ML IJ SOLN
5.0000 mg | INTRAMUSCULAR | Status: DC | PRN
  Administered 2020-12-10 – 2021-01-07 (×7): 5 mg via INTRAVENOUS
  Filled 2020-12-10 (×7): qty 1
  Filled 2020-12-10: qty 2
  Filled 2020-12-10 (×2): qty 1

## 2020-12-10 MED ORDER — ATORVASTATIN CALCIUM 40 MG PO TABS
40.0000 mg | ORAL_TABLET | Freq: Every day | ORAL | Status: DC
  Administered 2020-12-10 – 2020-12-14 (×4): 40 mg via ORAL
  Filled 2020-12-10 (×4): qty 1

## 2020-12-10 MED ORDER — DOXYCYCLINE HYCLATE 100 MG PO TABS
100.0000 mg | ORAL_TABLET | Freq: Two times a day (BID) | ORAL | Status: DC
  Administered 2020-12-10 – 2020-12-14 (×8): 100 mg via ORAL
  Filled 2020-12-10 (×9): qty 1

## 2020-12-10 MED ORDER — ALBUTEROL SULFATE (2.5 MG/3ML) 0.083% IN NEBU
2.5000 mg | INHALATION_SOLUTION | RESPIRATORY_TRACT | Status: DC | PRN
  Administered 2020-12-12: 2.5 mg via RESPIRATORY_TRACT
  Filled 2020-12-10: qty 3

## 2020-12-10 MED ORDER — FLUOXETINE HCL 20 MG PO CAPS
80.0000 mg | ORAL_CAPSULE | Freq: Every day | ORAL | Status: DC
  Administered 2020-12-10 – 2021-01-04 (×23): 80 mg via ORAL
  Filled 2020-12-10 (×25): qty 4

## 2020-12-10 MED ORDER — ACETAMINOPHEN 650 MG RE SUPP: 650.0000 mg | Freq: Four times a day (QID) | RECTAL | Status: DC | PRN

## 2020-12-10 NOTE — Progress Notes (Signed)
PROGRESS NOTE    Lori Cooper  [id number] DOB: Jan 03, 1956 DOA: 12/09/2020 PCP: Sharion Balloon, FNP   Brief Narrative:  HPI: Lori Cooper is a 65 y.o. female with medical history significant of anxiety, depression, chronic back pain, postlaminectomy pain of cervical and lumbar spine, closed fracture history of left shoulder and left ulnar (olecranon process) fracture, history of esophagitis, GERD/hiatal hernia, adenomatous colon polyp, history of idiopathic seizure, hyperlipidemia, hyperlipidemia, osteoarthritis, restless syndrome, supraumbilical hernia, umbilical hernia who is coming to the emergency department due to progressively worse dyspnea for the past week associated with nonproductive cough, decreased appetite, fatigue for the past week.  She denies fever, chills, sore throat, rhinorrhea, wheezing or hemoptysis.  No chest pain, palpitations, dizziness, diaphoresis, PND or lower extremity edema.  The patient states that she has trouble lying down on her right side and her only comfortable positions are semiseated position or lying down on her left side.  She denies emesis, nausea, diarrhea, constipation, melena or hematochezia.  No dysuria, frequency or hematuria.  No polyuria, polydipsia, polyphagia or blurred vision.  ED Course: Initial vital signs were temperature 98.6 F, pulse was 89, respirations 22, BP 127/62 mmHg and O2 sat 100% on NRB oxygen, but this was subsequently tapered down to 4 LPM via [place].  Labwork: CBC showed a white count of 11.2, hemoglobin 12.5 g/dL and platelets 349.  BNP, troponin x2, and phosphorus were normal.  Although magnesium was suboptimal in the presence of prolonged QT at 1.8 mg/dL.  CMP showed normal electrolytes when calcium was corrected to albumin.  Renal function was normal.  Glucose was 110 and total bilirubin was 2.4 mg/dL.  Total protein 7.5, albumin 2.6, AST 274, ALT 116 and alkaline phosphatase 147 units/L.  Imaging: A portable chest  radiograph showed cardiomegaly with vascular congestion.  There were probably right pleural effusion with increased dense airspace disease in the right middle lobe and right base, atelectasis versus pneumonia.  CTA chest showed a large right-sided pleural effusion with near complete collapse of the right lower lobe and shift to the left of the patient's mediastinum.  There is an apparent large defect in the diaphragm laterally on the right and surgical consultation is recommended.  Small volume free fluid in the patient's upper abdomen.  Healing sternal body fracture.  Superacute fracture of the posterior a rib on the right with evidence for nonunion.  There was aortic atherosclerosis.  Assessment & Plan:   Principal Problem:   Pleural effusion on right Active Problems:   HLD (hyperlipidemia)   GERD (gastroesophageal reflux disease)   Prediabetes   Depression   History of pulmonary embolus (PE)   Class 1 obesity   Prolonged QT interval   Diaphragmatic hernia   Aortic atherosclerosis (HCC)  Acute large pleural effusion on right/diaphragmatic hernia: Patient is pending transfer to [facility] to seek consultation with general surgery as well as cardiothoracic surgery since she has large pleural effusion to the point that it is pushing the mediastinum to the left along with collapse of the right lung and possibly community-acquired pneumonia.  I have been told that it may be several hours or may be a day before she gets the bed.  Patient was requiring nonrebreather initially but now she is down to 4 L and feels much better but I do believe that patient will benefit from at least thoracentesis which can be done here.  I have ordered diagnostic as well as thoracentesis and labs are ordered as  well.  I am not quite so sure if cardiothoracic surgery or general surgery has been notified about the transfer by admitting physician.  No documentation of such conversation.  Nonetheless, they prefer to be  notified once patient arrives at the [facility].  Continue supplemental oxygen.  Holding Eliquis for possible surgical need. Patient received Rocephin in the ED.  Procalcitonin 0.23.  Although borderline but clinically I do think that she has pneumonia component so I will resume Rocephin as well as doxycycline.   Prolonged QT interval/hypokalemia: QTC 563 during this admission which was normal in September. Magnesium was supplemented. Keep electrolytes optimized.  Try to keep magnesium over 2 and potassium over 4.  Potassium 3.4 today.  Will replace.  Repeat EKG in the morning. Avoid QT interval prolonging meds.    HLD (hyperlipidemia) Continue atorvastatin 40 mg p.o. daily.    GERD (gastroesophageal reflux disease) Continue famotidine 20 mg p.o. bedtime. Continue pantoprazole 20 mg p.o. daily.    Prediabetes  Currently NPO. Hemoglobin A 166.1% 4 months ago. Will start on SSI.    Class 1 obesity Has lost 35 pounds. Continue lifestyle modifications. Follow-up with PCP.    History of pulmonary embolus (PE) No PE on CTA. Will continue to hold Eliquis pending surgical evaluation.    Depression Resume fluoxetine.  Hold restarting medications for now due to risk of prolonged QT interval.   DVT prophylaxis:    Code Status: Full Code  Family Communication: None present at bedside.  Plan of care discussed with patient in length and he verbalized understanding and agreed with it.  Status is: Observation  The patient will require care spanning > 2 midnights and should be moved to inpatient because: Inpatient level of care appropriate due to severity of illness  Dispo: The patient is from: Home              Anticipated d/c is to: SNF              Anticipated d/c date is: 3 days              Patient currently is not medically stable to d/c.   Difficult to place patient No        Estimated body mass index is 33.28 kg/m as calculated from the following:    Height as of this encounter: '5\' 5"'$  (1.651 m).   Weight as of this encounter: 90.7 kg.      Nutritional status:               Consultants:   None  Procedures:   None  Antimicrobials:  Anti-infectives (From admission, onward)   Start     Dose/Rate Route Frequency Ordered Stop   12/10/20 1000  doxycycline (VIBRA-TABS) tablet 100 mg        100 mg Oral Every 12 hours 12/10/20 0930     12/10/20 0945  cefTRIAXone (ROCEPHIN) 1 g in sodium chloride 0.9 % 100 mL IVPB        1 g 200 mL/hr over 30 Minutes Intravenous Every 24 hours 12/10/20 0930     12/09/20 2200  cefTRIAXone (ROCEPHIN) 1 g in sodium chloride 0.9 % 100 mL IVPB        1 g 200 mL/hr over 30 Minutes Intravenous  Once 12/09/20 2159 12/09/20 2302         Subjective: Patient seen and examined, still in the ED.  She tells me that she feels much better as far as  her breathing goes.  Just feels weak.  Does not have any other complaint.  Objective: Vitals:   12/09/20 2300 12/09/20 2330 12/10/20 0509 12/10/20 1015  BP: (!) 121/52 131/67 97/76 (!) 115/52  Pulse: 86 88 88 88  Resp: 20 (!) '24 16 17  '$ Temp:      TempSrc:      SpO2: 99% 97% 97% 93%  Weight:      Height:        Intake/Output Summary (Last 24 hours) at 12/10/2020 1056 Last data filed at 12/10/2020 0146 Gross per 24 hour  Intake 150 ml  Output --  Net 150 ml   Filed Weights   12/09/20 2023  Weight: 90.7 kg    Examination:  General exam: Appears calm and comfortable, morbidly obese Respiratory system: Rhonchi at the left middle and lower lobe and diminished breath sounds at the right middle and lower lobe. Respiratory effort normal. Cardiovascular system: S1 & S2 heard, RRR. No JVD, murmurs, rubs, gallops or clicks. No pedal edema. Gastrointestinal system: Abdomen is nondistended, soft and nontender. No organomegaly or masses felt. Normal bowel sounds heard.  Healing surgical longitudinal closed wound with dressing on it.  No signs of active  infection or bleeding. Central nervous system: Alert and oriented. No focal neurological deficits. Extremities: Symmetric 5 x 5 power. Skin: No rashes, lesions or ulcers Psychiatry: Judgement and insight appear normal. Mood & affect appropriate.      Data Reviewed: I have personally reviewed following labs and imaging studies  CBC: Recent Labs  Lab 12/09/20 2037 12/10/20 0703  WBC 11.2* 11.4*  NEUTROABS 8.2* 8.2*  HGB 12.5 11.1*  HCT 37.7 33.1*  MCV 79.9* 79.6*  PLT 349 0000000   Basic Metabolic Panel: Recent Labs  Lab 12/09/20 2037 12/10/20 0703  NA 135 136  K 3.8 3.4*  CL 102 103  CO2 23 24  GLUCOSE 110* 89  BUN 9 9  CREATININE 0.59 0.62  CALCIUM 8.4* 8.1*  MG 1.8  --   PHOS 3.1  --    GFR: Estimated Creatinine Clearance: 77 mL/min (by C-G formula based on SCr of 0.62 mg/dL). Liver Function Tests: Recent Labs  Lab 12/09/20 2037 12/10/20 0703  AST 274* 226*  ALT 116* 96*  ALKPHOS 147* 127*  BILITOT 2.4* 1.7*  PROT 7.5 6.4*  ALBUMIN 2.6* 2.3*   No results for input(s): LIPASE, AMYLASE in the last 168 hours. No results for input(s): AMMONIA in the last 168 hours. Coagulation Profile: No results for input(s): INR, PROTIME in the last 168 hours. Cardiac Enzymes: No results for input(s): CKTOTAL, CKMB, CKMBINDEX, TROPONINI in the last 168 hours. BNP (last 3 results) No results for input(s): PROBNP in the last 8760 hours. HbA1C: No results for input(s): HGBA1C in the last 72 hours. CBG: No results for input(s): GLUCAP in the last 168 hours. Lipid Profile: No results for input(s): CHOL, HDL, LDLCALC, TRIG, CHOLHDL, LDLDIRECT in the last 72 hours. Thyroid Function Tests: No results for input(s): TSH, T4TOTAL, FREET4, T3FREE, THYROIDAB in the last 72 hours. Anemia Panel: No results for input(s): VITAMINB12, FOLATE, FERRITIN, TIBC, IRON, RETICCTPCT in the last 72 hours. Sepsis Labs: Recent Labs  Lab 12/09/20 2037 12/10/20 0703  PROCALCITON 0.20 0.23     Recent Results (from the past 240 hour(s))  SARS Coronavirus 2 by RT PCR (hospital order, performed in [facility] hospital lab) Nasopharyngeal Nasopharyngeal Swab     Status: None   Collection Time: 12/09/20  8:35 PM  Specimen: Nasopharyngeal Swab  Result Value Ref Range Status   SARS Coronavirus 2 NEGATIVE NEGATIVE Final    Comment: (NOTE) SARS-CoV-2 target nucleic acids are NOT DETECTED.  The SARS-CoV-2 RNA is generally detectable in upper and lower respiratory specimens during the acute phase of infection. The lowest concentration of SARS-CoV-2 viral copies this assay can detect is 250 copies / mL. A negative result does not preclude SARS-CoV-2 infection and should not be used as the sole basis for treatment or other patient management decisions.  A negative result may occur with improper specimen collection / handling, submission of specimen other than nasopharyngeal swab, presence of viral mutation(s) within the areas targeted by this assay, and inadequate number of viral copies (<250 copies / mL). A negative result must be combined with clinical observations, patient history, and epidemiological information.  Fact Sheet for Patients:   StrictlyIdeas.no  Fact Sheet for Healthcare Providers: BankingDealers.co.za  This test is not yet approved or  cleared by the [place] FDA and has been authorized for detection and/or diagnosis of SARS-CoV-2 by FDA under an Emergency Use Authorization (EUA).  This EUA will remain in effect (meaning this test can be used) for the duration of the COVID-19 declaration under Section 564(b)(1) of the Act, 21 U.S.C. section 360bbb-3(b)(1), unless the authorization is terminated or revoked sooner.  Performed at [facility], [address] [place], [place] [address]       Radiology Studies: CT Angio Chest PE W and/or Wo Contrast  Result Date: 12/10/2020 CLINICAL DATA:  PE suspected.   Shortness of breath. EXAM: CT ANGIOGRAPHY CHEST WITH CONTRAST TECHNIQUE: Multidetector CT imaging of the chest was performed using the standard protocol during bolus administration of intravenous contrast. Multiplanar CT image reconstructions and MIPs were obtained to evaluate the vascular anatomy. CONTRAST:  173m OMNIPAQUE IOHEXOL 350 MG/ML SOLN COMPARISON:  08/08/2020 FINDINGS: Cardiovascular: Contrast injection is sufficient to demonstrate satisfactory opacification of the pulmonary arteries to the segmental level. There is no pulmonary embolus or evidence of right heart strain. The size of the main pulmonary artery is normal. Heart size is normal, with no pericardial effusion. There are atherosclerotic changes of the thoracic aorta without evidence for dissection or aneurysm. Mediastinum/Nodes: -- No mediastinal lymphadenopathy. -- No hilar lymphadenopathy. -- No axillary lymphadenopathy. -- No supraclavicular lymphadenopathy. -- Normal thyroid gland where visualized. -  Unremarkable esophagus. Lungs/Pleura: There is a large right-sided pleural effusion with near complete collapse of the right lower lobe. There is no pneumothorax. There is shift of the mediastinum to the patient's left. There is a trace left-sided pleural effusion. There is atelectasis at the left lung base. There is an apparent large defect in the diaphragm laterally on the right (axial series 6, image 81). There is fluid tracking into the patient's right flank and upper abdomen. Upper Abdomen: Contrast bolus timing is not optimized for evaluation of the abdominal organs. There is a small volume of free fluid in the patient's upper abdomen. Musculoskeletal: There is a healing sternal body fracture. There is a fracture of the posterior eighth rib on the right with evidence for nonunion. Review of the MIP images confirms the above findings. IMPRESSION: 1. Large right-sided pleural effusion with near complete collapse of the right lower lobe and  shift of the mediastinum to the patient's left. 2. Apparent large defect in the diaphragm laterally on the right. Surgical consultation is recommended. 3. Small volume free fluid in the patient's upper abdomen. 4. Healing sternal body fracture. 5. Subacute fracture  of the posterior eighth rib on the right with evidence for nonunion. Aortic Atherosclerosis (ICD10-I70.0). Electronically Signed   By: Constance Holster M.D.   On: 12/10/2020 00:52   DG Chest Portable 1 View  Result Date: 12/09/2020 CLINICAL DATA:  Shortness of breath EXAM: PORTABLE CHEST 1 VIEW COMPARISON:  08/19/2020 FINDINGS: Cardiomegaly with aortic atherosclerosis. Probable right pleural effusion. Dense airspace disease at the right middle lobe and right base. Vascular congestion. No pneumothorax. IMPRESSION: 1. Cardiomegaly with vascular congestion. 2. Probable right pleural effusion with increased dense airspace disease at the right middle lobe and right base, atelectasis versus pneumonia. Electronically Signed   By: Donavan Foil M.D.   On: 12/09/2020 21:06    Scheduled Meds: . doxycycline  100 mg Oral Q12H   Continuous Infusions: . cefTRIAXone (ROCEPHIN)  IV 1 g (12/10/20 1036)     LOS: 0 days   Time spent: 40 minutes   Darliss Cheney, MD [facility]  12/10/2020, 10:56 AM   To contact the attending provider between 7A-7P or the covering provider during after hours 7P-7A, please log into the web site [email or web address].

## 2020-12-10 NOTE — ED Notes (Addendum)
Dressing changed to mid abdomen.  Well healing incision noted, pink edges with scar to center of wound. No drainage noted.  Non-adhesive dressing applied with Hydroflex.

## 2020-12-10 NOTE — ED Notes (Signed)
Dressing to mid abdomen dry and intact.

## 2020-12-10 NOTE — Procedures (Signed)
PreOperative Dx: RIGHT pleural effusion Postoperative Dx: RIGHT pleural effusion Procedure:   US guided RIGHT thoracentesis Radiologist:  Thornton Papas Anesthesia:  10 ml of 1% lidocaine Specimen:  1.7 L of clear yellow colored fluid EBL:   < 1 ml Complications: None

## 2020-12-10 NOTE — ED Notes (Signed)
Patient transported to Ultrasound 

## 2020-12-10 NOTE — Sedation Documentation (Signed)
Patient tolerated right sided thoracentesis procedure well today and 1.7 Liters of clear yellow fluid removed with labs collected and sent for processing. PT taken to xray department for post thoracentesis chest xray at this time. ED nurse Daphyne, RN given report at this time.

## 2020-12-11 ENCOUNTER — Inpatient Hospital Stay (HOSPITAL_COMMUNITY): Payer: PPO

## 2020-12-11 DIAGNOSIS — J9601 Acute respiratory failure with hypoxia: Secondary | ICD-10-CM | POA: Diagnosis not present

## 2020-12-11 DIAGNOSIS — J90 Pleural effusion, not elsewhere classified: Secondary | ICD-10-CM | POA: Diagnosis not present

## 2020-12-11 LAB — GLUCOSE, CAPILLARY
Glucose-Capillary: 69 mg/dL — ABNORMAL LOW (ref 70–99)
Glucose-Capillary: 63 mg/dL — ABNORMAL LOW (ref 70–99)
Glucose-Capillary: 74 mg/dL (ref 70–99)
Glucose-Capillary: 117 mg/dL — ABNORMAL HIGH (ref 70–99)
Glucose-Capillary: 126 mg/dL — ABNORMAL HIGH (ref 70–99)
Glucose-Capillary: 111 mg/dL — ABNORMAL HIGH (ref 70–99)
Glucose-Capillary: 109 mg/dL — ABNORMAL HIGH (ref 70–99)

## 2020-12-11 LAB — COMPREHENSIVE METABOLIC PANEL
ALT: 90 U/L — ABNORMAL HIGH (ref 0–44)
AST: 241 U/L — ABNORMAL HIGH (ref 15–41)
Albumin: 1.9 g/dL — ABNORMAL LOW (ref 3.5–5.0)
Alkaline Phosphatase: 114 U/L (ref 38–126)
Anion gap: 10 (ref 5–15)
BUN: 10 mg/dL (ref 8–23)
CO2: 21 mmol/L — ABNORMAL LOW (ref 22–32)
Calcium: 8 mg/dL — ABNORMAL LOW (ref 8.9–10.3)
Chloride: 105 mmol/L (ref 98–111)
Creatinine, Ser: 0.69 mg/dL (ref 0.44–1.00)
GFR, Estimated: >60 mL/min (ref >60)
Glucose, Bld: 74 mg/dL (ref 70–99)
Potassium: 4.4 mmol/L (ref 3.5–5.1)
Sodium: 136 mmol/L (ref 135–145)
Total Bilirubin: 2.1 mg/dL — ABNORMAL HIGH (ref 0.3–1.2)
Total Protein: 5.7 g/dL — ABNORMAL LOW (ref 6.5–8.1)

## 2020-12-11 LAB — CBC WITH DIFFERENTIAL/PLATELET
Abs Immature Granulocytes: 0.05 10*3/uL (ref 0.00–0.07)
Basophils Absolute: 0.1 10*3/uL (ref 0.0–0.1)
Basophils Relative: 1 %
Eosinophils Absolute: 0.1 10*3/uL (ref 0.0–0.5)
Eosinophils Relative: 1 %
HCT: 31 % — ABNORMAL LOW (ref 36.0–46.0)
Hemoglobin: 10.4 g/dL — ABNORMAL LOW (ref 12.0–15.0)
Immature Granulocytes: 0 %
Lymphocytes Relative: 15 %
Lymphs Abs: 2 10*3/uL (ref 0.7–4.0)
MCH: 26.1 pg (ref 26.0–34.0)
MCHC: 33.5 g/dL (ref 30.0–36.0)
MCV: 77.7 fL — ABNORMAL LOW (ref 80.0–100.0)
Monocytes Absolute: 0.9 10*3/uL (ref 0.1–1.0)
Monocytes Relative: 7 %
Neutro Abs: 10 10*3/uL — ABNORMAL HIGH (ref 1.7–7.7)
Neutrophils Relative %: 76 %
Platelets: 304 10*3/uL (ref 150–400)
RBC: 3.99 MIL/uL (ref 3.87–5.11)
RDW: 19.6 % — ABNORMAL HIGH (ref 11.5–15.5)
WBC: 13 10*3/uL — ABNORMAL HIGH (ref 4.0–10.5)
nRBC: 0 % (ref 0.0–0.2)

## 2020-12-11 LAB — AMYLASE, BODY FLUID (OTHER): Amylase, Body Fluid: 8 U/L

## 2020-12-11 LAB — PROCALCITONIN: Procalcitonin: 0.28 ng/mL

## 2020-12-11 LAB — PATHOLOGIST SMEAR REVIEW

## 2020-12-11 MED ORDER — AZITHROMYCIN 500 MG PO TABS: 500.0000 mg | ORAL_TABLET | Freq: Every day | ORAL | 0 refills | Status: DC

## 2020-12-11 MED ORDER — INSULIN ASPART 100 UNIT/ML ~~LOC~~ SOLN
0.0000 [IU] | Freq: Three times a day (TID) | SUBCUTANEOUS | Status: DC
  Administered 2020-12-11 – 2020-12-17 (×3): 1 [IU] via SUBCUTANEOUS
  Administered 2020-12-18 – 2020-12-26 (×2): 2 [IU] via SUBCUTANEOUS
  Administered 2020-12-26 – 2020-12-27 (×2): 1 [IU] via SUBCUTANEOUS
  Administered 2020-12-27: 2 [IU] via SUBCUTANEOUS
  Administered 2020-12-28: 1 [IU] via SUBCUTANEOUS
  Administered 2020-12-28: 0 [IU] via SUBCUTANEOUS
  Administered 2020-12-28 – 2021-01-01 (×5): 1 [IU] via SUBCUTANEOUS
  Administered 2021-01-04: 2 [IU] via SUBCUTANEOUS
  Administered 2021-01-04 – 2021-01-10 (×3): 1 [IU] via SUBCUTANEOUS

## 2020-12-11 MED ORDER — CEFUROXIME AXETIL 500 MG PO TABS: 500.0000 mg | ORAL_TABLET | Freq: Two times a day (BID) | ORAL | 0 refills | Status: DC

## 2020-12-11 NOTE — Discharge Summary (Incomplete)
Physician Discharge Summary  Lori Cooper [id number] DOB: [birth year] DOA: 12/09/2020  PCP: Sharion Balloon, FNP  Admit date: 12/09/2020 Discharge date: 12/11/2020  Admitted From: ***  Disposition:  ***   Recommendations for Outpatient Follow-up:  1. ***  Home Health:  ***  Discharge Condition:  ***   CODE STATUS:  ***   Diet recommendation:  *** Consultations:  ***  Procedures/Studies: . 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  Hospital Course:  Right pleural effusion -    Discharge Exam: Vitals:   12/11/20 0336 12/11/20 0747  BP: (!) 109/48 (!) 110/50  Pulse: 89 87  Resp: 18 (!) 22  Temp: 98.9 F (37.2 C) 98.1 F (36.7 C)  SpO2: 92% 96%   Vitals:   12/10/20 2122 12/10/20 2303 12/11/20 0336 12/11/20 0747  BP:  (!) 104/46 (!) 109/48 (!) 110/50  Pulse: 88  89 87  Resp: '20 18 18 '$ (!) 22  Temp: 98 F (36.7 C) 99.3 F (37.4 C) 98.9 F (37.2 C) 98.1 F (36.7 C)  TempSrc: Oral Oral Oral Oral  SpO2: 94%  92% 96%  Weight:      Height:         General: Pt is alert, awake, not in acute distress Cardiovascular: RRR, S1/S2 +, no rubs, no gallops Respiratory: CTA bilaterally, no wheezing, no rhonchi Abdominal: Soft, NT, ND, bowel sounds + Extremities: no edema, no cyanosis   Discharge Instructions   Allergies as of 12/11/2020   No Known Allergies   Med Rec must be completed prior to using this [facility]***       Follow-up Information    Sharion Balloon, FNP Follow up on 12/17/2020.   Specialty: Family Medicine Why: 11:30 Tuesday Contact information: [place] [address] [phone]              No Known Allergies    DG Chest 1 View  Result Date: 12/10/2020 CLINICAL DATA:  Status post thoracentesis EXAM: CHEST  1 VIEW COMPARISON:  Chest radiograph and chest CT December 09, 2020 FINDINGS: No pneumothorax. Right pleural effusion significantly smaller after thoracentesis. Fairly small right pleural effusion remains. There is mild atelectatic change in the right base. There is no edema or airspace opacity. Heart size and pulmonary vascularity are normal. There is aortic atherosclerosis. There is a focal hiatal type hernia. No bone lesions. IMPRESSION: No pneumothorax. Fairly small residual right pleural effusion. Right base atelectasis. Stable cardiac silhouette.  Hiatal type hernia present. Aortic Atherosclerosis (ICD10-I70.0). Electronically Signed   By: Lowella Grip III M.D.   On: 12/10/2020 15:08   CT Angio Chest PE W and/or Wo Contrast  Result Date: 12/10/2020 CLINICAL DATA:  PE suspected.  Shortness of breath. EXAM: CT ANGIOGRAPHY CHEST WITH CONTRAST TECHNIQUE: Multidetector CT imaging of the chest was performed using the standard protocol during bolus administration of intravenous contrast. Multiplanar CT image reconstructions and MIPs were obtained to evaluate the vascular anatomy. CONTRAST:  16m OMNIPAQUE IOHEXOL 350 MG/ML SOLN COMPARISON:  08/08/2020 FINDINGS: Cardiovascular:  Contrast injection is sufficient to demonstrate satisfactory opacification of the pulmonary arteries to the segmental level. There is no pulmonary embolus or evidence of right heart strain. The size of the main pulmonary artery is normal. Heart size is normal, with no pericardial effusion. There are atherosclerotic changes of the thoracic aorta without evidence for dissection or aneurysm. Mediastinum/Nodes: -- No mediastinal lymphadenopathy. -- No hilar lymphadenopathy. -- No axillary lymphadenopathy. -- No supraclavicular lymphadenopathy. -- Normal thyroid gland where visualized. -  Unremarkable esophagus. Lungs/Pleura: There is a large right-sided pleural effusion with near complete collapse of the right lower lobe. There is no pneumothorax. There is shift of the mediastinum to the patient's left. There is a trace left-sided pleural effusion. There is atelectasis at the left lung base. There is an apparent large defect in the diaphragm laterally on the right (axial series 6, image 81). There is fluid tracking into the patient's right flank and upper abdomen. Upper Abdomen: Contrast bolus timing is not optimized for evaluation of the abdominal organs. There is a small volume of free fluid in the patient's upper abdomen. Musculoskeletal: There is a healing sternal body fracture. There is a fracture of the posterior eighth rib on the right with evidence for nonunion. Review of the MIP images confirms the above findings. IMPRESSION: 1. Large right-sided pleural effusion with near complete collapse of the right lower lobe and shift of the mediastinum to the patient's left. 2. Apparent large defect in the diaphragm laterally on the right. Surgical consultation is recommended. 3. Small volume free fluid in the patient's upper abdomen. 4. Healing sternal body fracture. 5. Subacute fracture of the posterior eighth rib on the right with evidence for nonunion. Aortic Atherosclerosis (ICD10-I70.0). Electronically Signed   By:  CConstance HolsterM.D.   On: 12/10/2020 00:52   DG Chest Portable 1 View  Result Date: 12/09/2020 CLINICAL DATA:  Shortness of breath EXAM: PORTABLE CHEST 1 VIEW COMPARISON:  08/19/2020 FINDINGS: Cardiomegaly with aortic atherosclerosis. Probable right pleural effusion. Dense airspace disease at the right middle lobe and right base. Vascular congestion. No pneumothorax. IMPRESSION: 1. Cardiomegaly with vascular congestion. 2. Probable right pleural effusion with increased dense airspace disease at the right middle lobe and right base, atelectasis versus pneumonia. Electronically Signed   By: KDonavan FoilM.D.   On: 12/09/2020 21:06   [place]ORACENTESIS ASP PLEURAL SPACE W/IMG GUIDE  Result Date: 12/10/2020 INDICATION: RIGHT pleural effusion and shortness of breath EXAM: ULTRASOUND GUIDED DIAGNOSTIC AND THERAPEUTIC RIGHT THORACENTESIS MEDICATIONS: None. COMPLICATIONS: None immediate. PROCEDURE: An ultrasound guided thoracentesis was thoroughly discussed with the patient and questions answered. The benefits, risks, alternatives and complications were also discussed. The patient understands and wishes to proceed with the procedure. Written consent was obtained. Ultrasound was performed to localize and mark an adequate pocket of fluid in the RIGHT chest. The area was then prepped and draped in the normal sterile fashion. 1% Lidocaine was used for local anesthesia. Under ultrasound guidance a 8 French thoracentesis catheter was introduced. Thoracentesis was performed. The catheter was removed and a dressing applied. FINDINGS: A total of approximately 1.7 L of clear yellow RIGHT pleural fluid was removed. Samples were  sent to the laboratory as requested by the clinical team. IMPRESSION: Successful ultrasound guided RIGHT thoracentesis yielding 1.7 L of pleural fluid. Electronically Signed   By: Lavonia Dana M.D.   On: 12/10/2020 15:20      The results of significant diagnostics from this hospitalization  (including imaging, microbiology, ancillary and laboratory) are listed below for reference.     Microbiology: Recent Results (from the past 240 hour(s))  SARS Coronavirus 2 by RT PCR (hospital order, performed in [facility] hospital lab) Nasopharyngeal Nasopharyngeal Swab     Status: None   Collection Time: 12/09/20  8:35 PM   Specimen: Nasopharyngeal Swab  Result Value Ref Range Status   SARS Coronavirus 2 NEGATIVE NEGATIVE Final    Comment: (NOTE) SARS-CoV-2 target nucleic acids are NOT DETECTED.  The SARS-CoV-2 RNA is generally detectable in upper and lower respiratory specimens during the acute phase of infection. The lowest concentration of SARS-CoV-2 viral copies this assay can detect is 250 copies / mL. A negative result does not preclude SARS-CoV-2 infection and should not be used as the sole basis for treatment or other patient management decisions.  A negative result may occur with improper specimen collection / handling, submission of specimen other than nasopharyngeal swab, presence of viral mutation(s) within the areas targeted by this assay, and inadequate number of viral copies (<250 copies / mL). A negative result must be combined with clinical observations, patient history, and epidemiological information.  Fact Sheet for Patients:   StrictlyIdeas.no  Fact Sheet for Healthcare Providers: BankingDealers.co.za  This test is not yet approved or  cleared by the [place] FDA and has been authorized for detection and/or diagnosis of SARS-CoV-2 by FDA under an Emergency Use Authorization (EUA).  This EUA will remain in effect (meaning this test can be used) for the duration of the COVID-19 declaration under Section 564(b)(1) of the Act, 21 U.S.C. section 360bbb-3(b)(1), unless the authorization is terminated or revoked sooner.  Performed at [facility], [address] [place], [place] [address]   Gram stain      Status: None   Collection Time: 12/10/20  2:45 PM   Specimen: Pleura; Body Fluid  Result Value Ref Range Status   Specimen Description PLEURAL  Final   Special Requests NONE  Final   Gram Stain   Final    NO ORGANISMS SEEN WBC PRESENT, PREDOMINANTLY MONONUCLEAR CYTOSPIN SMEAR Performed at [facility], [address], [place], [place] [address]    Report Status 12/10/2020 FINAL  Final  Culture, body fluid-bottle     Status: None (Preliminary result)   Collection Time: 12/10/20  2:45 PM   Specimen: Pleura  Result Value Ref Range Status   Specimen Description PLEURAL  Final   Special Requests BOTTLES DRAWN AEROBIC AND ANAEROBIC 10CC  Final   Culture   Final    NO GROWTH < 24 HOURS Performed at [facility], [address] [place], [place] [address]    Report Status PENDING  Incomplete     Labs: BNP (last 3 results) Recent Labs    12/09/20 2037  BNP 0000000   Basic Metabolic Panel: Recent Labs  Lab 12/09/20 2037 12/10/20 0703 12/11/20 0220  NA 135 136 136  K 3.8 3.4* 4.4  CL 102 103 105  CO2 23 24 21*  GLUCOSE 110* 89 74  BUN '9 9 10  '$ CREATININE 0.59 0.62 0.69  CALCIUM 8.4* 8.1* 8.0*  MG 1.8 2.2  --   PHOS 3.1  --   --  Liver Function Tests: Recent Labs  Lab 12/09/20 2037 12/10/20 0703 12/11/20 0220  AST 274* 226* 241*  ALT 116* 96* 90*  ALKPHOS 147* 127* 114  BILITOT 2.4* 1.7* 2.1*  PROT 7.5 6.4* 5.7*  ALBUMIN 2.6* 2.4*  2.3* 1.9*   No results for input(s): LIPASE, AMYLASE in the last 168 hours. No results for input(s): AMMONIA in the last 168 hours. CBC: Recent Labs  Lab 12/09/20 2037 12/10/20 0703 12/11/20 0220  WBC 11.2* 11.4* 13.0*  NEUTROABS 8.2* 8.2* 10.0*  HGB 12.5 11.1* 10.4*  HCT 37.7 33.1* 31.0*  MCV 79.9* 79.6* 77.7*  PLT 349 318 304   Cardiac Enzymes: No results for input(s): CKTOTAL, CKMB, CKMBINDEX, TROPONINI in the last 168 hours. BNP: Invalid input(s): POCBNP CBG: Recent Labs  Lab 12/11/20 0337 12/11/20 0747  12/11/20 0840 12/11/20 1141 12/11/20 1626  GLUCAP 69* 63* 74 117* 126*   D-Dimer No results for input(s): DDIMER in the last 72 hours. Hgb A1c Recent Labs    12/10/20 0703  HGBA1C 5.6   Lipid Profile No results for input(s): CHOL, HDL, LDLCALC, TRIG, CHOLHDL, LDLDIRECT in the last 72 hours. Thyroid function studies No results for input(s): TSH, T4TOTAL, T3FREE, THYROIDAB in the last 72 hours.  Invalid input(s): FREET3 Anemia work up No results for input(s): VITAMINB12, FOLATE, FERRITIN, TIBC, IRON, RETICCTPCT in the last 72 hours. Urinalysis    Component Value Date/Time   COLORURINE YELLOW 08/18/2020 1322   APPEARANCEUR CLEAR 08/18/2020 1322   APPEARANCEUR Clear 03/23/2017 1006   LABSPEC 1.006 08/18/2020 1322   PHURINE 6.0 08/18/2020 1322   GLUCOSEU NEGATIVE 08/18/2020 1322   HGBUR SMALL (A) 08/18/2020 1322   BILIRUBINUR NEGATIVE 08/18/2020 1322   BILIRUBINUR Negative 03/23/2017 1006   KETONESUR 20 (A) 08/18/2020 1322   PROTEINUR NEGATIVE 08/18/2020 1322   NITRITE NEGATIVE 08/18/2020 1322   LEUKOCYTESUR TRACE (A) 08/18/2020 1322   Sepsis Labs Invalid input(s): PROCALCITONIN,  WBC,  LACTICIDVEN Microbiology Recent Results (from the past 240 hour(s))  SARS Coronavirus 2 by RT PCR (hospital order, performed in [place] hospital lab) Nasopharyngeal Nasopharyngeal Swab     Status: None   Collection Time: 12/09/20  8:35 PM   Specimen: Nasopharyngeal Swab  Result Value Ref Range Status   SARS Coronavirus 2 NEGATIVE NEGATIVE Final    Comment: (NOTE) SARS-CoV-2 target nucleic acids are NOT DETECTED.  The SARS-CoV-2 RNA is generally detectable in upper and lower respiratory specimens during the acute phase of infection. The lowest concentration of SARS-CoV-2 viral copies this assay can detect is 250 copies / mL. A negative result does not preclude SARS-CoV-2 infection and should not be used as the sole basis for treatment or other patient management decisions.  A  negative result may occur with improper specimen collection / handling, submission of specimen other than nasopharyngeal swab, presence of viral mutation(s) within the areas targeted by this assay, and inadequate number of viral copies (<250 copies / mL). A negative result must be combined with clinical observations, patient history, and epidemiological information.  Fact Sheet for Patients:   StrictlyIdeas.no  Fact Sheet for Healthcare Providers: BankingDealers.co.za  This test is not yet approved or  cleared by the [place] FDA and has been authorized for detection and/or diagnosis of SARS-CoV-2 by FDA under an Emergency Use Authorization (EUA).  This EUA will remain in effect (meaning this test can be used) for the duration of the COVID-19 declaration under Section 564(b)(1) of the Act, 21 U.S.C. section 360bbb-3(b)(1), unless the authorization  is terminated or revoked sooner.  Performed at [facility], [address] [place], [place] [address]   Gram stain     Status: None   Collection Time: 12/10/20  2:45 PM   Specimen: Pleura; Body Fluid  Result Value Ref Range Status   Specimen Description PLEURAL  Final   Special Requests NONE  Final   Gram Stain   Final    NO ORGANISMS SEEN WBC PRESENT, PREDOMINANTLY MONONUCLEAR CYTOSPIN SMEAR Performed at [facility], [address], [place], [place] [address]    Report Status 12/10/2020 FINAL  Final  Culture, body fluid-bottle     Status: None (Preliminary result)   Collection Time: 12/10/20  2:45 PM   Specimen: Pleura  Result Value Ref Range Status   Specimen Description PLEURAL  Final   Special Requests BOTTLES DRAWN AEROBIC AND ANAEROBIC 10CC  Final   Culture   Final    NO GROWTH < 24 HOURS Performed at [facility], [address] [place],  [address]    Report Status PENDING  Incomplete     Time coordinating discharge in minutes: ***  SIGNED:   Debbe Odea, MD  [facility] 12/11/2020, 4:28 PM
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
## 2020-12-12 ENCOUNTER — Inpatient Hospital Stay (HOSPITAL_COMMUNITY): Payer: PPO

## 2020-12-12 DIAGNOSIS — J90 Pleural effusion, not elsewhere classified: Secondary | ICD-10-CM

## 2020-12-12 LAB — GLUCOSE, CAPILLARY
Glucose-Capillary: 108 mg/dL — ABNORMAL HIGH (ref 70–99)
Glucose-Capillary: 111 mg/dL — ABNORMAL HIGH (ref 70–99)
Glucose-Capillary: 116 mg/dL — ABNORMAL HIGH (ref 70–99)
Glucose-Capillary: 116 mg/dL — ABNORMAL HIGH (ref 70–99)
Glucose-Capillary: 130 mg/dL — ABNORMAL HIGH (ref 70–99)
Glucose-Capillary: 148 mg/dL — ABNORMAL HIGH (ref 70–99)
Glucose-Capillary: 88 mg/dL (ref 70–99)

## 2020-12-12 LAB — CBC WITH DIFFERENTIAL/PLATELET
Abs Immature Granulocytes: 0.03 10*3/uL (ref 0.00–0.07)
Basophils Absolute: 0.1 10*3/uL (ref 0.0–0.1)
Basophils Relative: 1 %
Eosinophils Absolute: 0.3 10*3/uL (ref 0.0–0.5)
Eosinophils Relative: 3 %
HCT: 29.4 % — ABNORMAL LOW (ref 36.0–46.0)
Hemoglobin: 10.4 g/dL — ABNORMAL LOW (ref 12.0–15.0)
Immature Granulocytes: 0 %
Lymphocytes Relative: 22 %
Lymphs Abs: 2.3 10*3/uL (ref 0.7–4.0)
MCH: 27 pg (ref 26.0–34.0)
MCHC: 35.4 g/dL (ref 30.0–36.0)
MCV: 76.4 fL — ABNORMAL LOW (ref 80.0–100.0)
Monocytes Absolute: 0.9 10*3/uL (ref 0.1–1.0)
Monocytes Relative: 9 %
Neutro Abs: 6.7 10*3/uL (ref 1.7–7.7)
Neutrophils Relative %: 65 %
Platelets: 310 10*3/uL (ref 150–400)
RBC: 3.85 MIL/uL — ABNORMAL LOW (ref 3.87–5.11)
RDW: 19.1 % — ABNORMAL HIGH (ref 11.5–15.5)
WBC: 10.3 10*3/uL (ref 4.0–10.5)
nRBC: 0 % (ref 0.0–0.2)

## 2020-12-12 MED ORDER — APIXABAN 5 MG PO TABS
5.0000 mg | ORAL_TABLET | Freq: Two times a day (BID) | ORAL | Status: DC
  Administered 2020-12-12: 5 mg via ORAL
  Filled 2020-12-12: qty 1

## 2020-12-12 MED ORDER — CEFDINIR 300 MG PO CAPS
600.0000 mg | ORAL_CAPSULE | Freq: Every day | ORAL | Status: DC
  Administered 2020-12-14: 600 mg via ORAL
  Filled 2020-12-12 (×2): qty 2

## 2020-12-12 MED ORDER — ALBUTEROL SULFATE HFA 108 (90 BASE) MCG/ACT IN AERS
2.0000 | INHALATION_SPRAY | RESPIRATORY_TRACT | Status: DC | PRN
  Filled 2020-12-12: qty 6.7

## 2020-12-12 MED ORDER — DOXYCYCLINE MONOHYDRATE 100 MG PO TABS: 100.0000 mg | ORAL_TABLET | Freq: Two times a day (BID) | ORAL | 0 refills | Status: DC

## 2020-12-12 NOTE — Progress Notes (Signed)
Pt given PRN tx and placed on 2L [place] for comfort. RT will continue to monitor.
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
## 2020-12-12 NOTE — Progress Notes (Signed)
CSW met with pt regarding readmission risk.  Pt has own vehicle and drives self to medical appts.  PCP in place.  No other needs.  CSW unable to see any notes in epic past 12/10/20 5:05pm.  CSW spoke with RN and pt does not have any discharge needs. Lurline Idol, MSW, LCSW 1/27/20229:46 AM
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
## 2020-12-13 ENCOUNTER — Inpatient Hospital Stay (HOSPITAL_COMMUNITY): Payer: PPO

## 2020-12-13 ENCOUNTER — Encounter (HOSPITAL_COMMUNITY): Payer: Self-pay | Admitting: Family Medicine

## 2020-12-13 DIAGNOSIS — R0600 Dyspnea, unspecified: Secondary | ICD-10-CM | POA: Diagnosis not present

## 2020-12-13 DIAGNOSIS — J90 Pleural effusion, not elsewhere classified: Secondary | ICD-10-CM

## 2020-12-13 HISTORY — PX: IR CHEST FLUORO: IMG2383

## 2020-12-13 HISTORY — PX: IR PERC PLEURAL DRAIN W/INDWELL CATH W/IMG GUIDE: IMG5383

## 2020-12-13 LAB — CBC WITH DIFFERENTIAL/PLATELET
Abs Immature Granulocytes: 0.04 10*3/uL (ref 0.00–0.07)
Basophils Absolute: 0.1 10*3/uL (ref 0.0–0.1)
Basophils Relative: 1 %
Eosinophils Absolute: 0.4 10*3/uL (ref 0.0–0.5)
Eosinophils Relative: 3 %
HCT: 30.7 % — ABNORMAL LOW (ref 36.0–46.0)
Hemoglobin: 10.3 g/dL — ABNORMAL LOW (ref 12.0–15.0)
Immature Granulocytes: 0 %
Lymphocytes Relative: 18 %
Lymphs Abs: 2 10*3/uL (ref 0.7–4.0)
MCH: 25.9 pg — ABNORMAL LOW (ref 26.0–34.0)
MCHC: 33.6 g/dL (ref 30.0–36.0)
MCV: 77.1 fL — ABNORMAL LOW (ref 80.0–100.0)
Monocytes Absolute: 0.8 10*3/uL (ref 0.1–1.0)
Monocytes Relative: 7 %
Neutro Abs: 7.8 10*3/uL — ABNORMAL HIGH (ref 1.7–7.7)
Neutrophils Relative %: 71 %
Platelets: 270 10*3/uL (ref 150–400)
RBC: 3.98 MIL/uL (ref 3.87–5.11)
RDW: 19.7 % — ABNORMAL HIGH (ref 11.5–15.5)
WBC: 11.1 10*3/uL — ABNORMAL HIGH (ref 4.0–10.5)
nRBC: 0 % (ref 0.0–0.2)

## 2020-12-13 LAB — ECHOCARDIOGRAM COMPLETE
AR max vel: 2.35 cm2
AV Area VTI: 2.28 cm2
AV Area mean vel: 2.28 cm2
AV Mean grad: 6 mmHg
AV Peak grad: 12.4 mmHg
Ao pk vel: 1.76 m/s
Area-P 1/2: 3.91 cm2
Height: 65 in
S' Lateral: 3.1 cm
Weight: 3200 oz

## 2020-12-13 LAB — GLUCOSE, CAPILLARY
Glucose-Capillary: 96 mg/dL (ref 70–99)
Glucose-Capillary: 87 mg/dL (ref 70–99)
Glucose-Capillary: 85 mg/dL (ref 70–99)
Glucose-Capillary: 71 mg/dL (ref 70–99)
Glucose-Capillary: 99 mg/dL (ref 70–99)

## 2020-12-13 LAB — PROTIME-INR
INR: 1.6 — ABNORMAL HIGH (ref 0.8–1.2)
Prothrombin Time: 18.4 seconds — ABNORMAL HIGH (ref 11.4–15.2)

## 2020-12-13 MED ORDER — LIDOCAINE HCL 1 % IJ SOLN
INTRAMUSCULAR | Status: AC
  Filled 2020-12-13: qty 20

## 2020-12-13 MED ORDER — FENTANYL CITRATE (PF) 100 MCG/2ML IJ SOLN
INTRAMUSCULAR | Status: AC | PRN
  Administered 2020-12-13: 25 ug via INTRAVENOUS

## 2020-12-13 MED ORDER — FENTANYL CITRATE (PF) 100 MCG/2ML IJ SOLN
INTRAMUSCULAR | Status: AC
  Filled 2020-12-13: qty 2

## 2020-12-13 MED ORDER — MIDAZOLAM HCL 2 MG/2ML IJ SOLN
INTRAMUSCULAR | Status: AC
  Filled 2020-12-13: qty 2

## 2020-12-13 MED ORDER — MIDAZOLAM HCL 2 MG/2ML IJ SOLN
INTRAMUSCULAR | Status: AC | PRN
  Administered 2020-12-13: 0.5 mg via INTRAVENOUS

## 2020-12-13 MED ORDER — LIDOCAINE HCL 1 % IJ SOLN
INTRAMUSCULAR | Status: AC | PRN
Start: 2020-12-13 — End: 2020-12-13
  Administered 2020-12-13: 10 mL
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
## 2020-12-13 NOTE — Progress Notes (Signed)
PT back to floor from chest tube placement Set up to suction. Chest tube out put 2029m. MD WEarleen Newportpaged.

## 2020-12-13 NOTE — Progress Notes (Signed)
Chief Complaint: Patient was seen in consultation today for right chest tube  Referring Physician(s): Dr. Servando Snare  Supervising Physician: Corrie Mckusick  Patient Status: [facility] - In-pt  History of Present Illness: Lori Cooper is a 65 y.o. female with recent hx of trauma requiring open abd surgery and injurines including right rib fractures and hepatic laceration. She had an extended hospital stay and recovery but had been doing well. She then developed some more recent SOB and CT scan showed large right pleural effusion with large defect in the diaphragm. She underwent large volume thoracentesis on 1/25. The effusion has recurred and CT surgery was consulted. They have recommended IR place perc chest tube. PMHx, meds, labs, imaging, allergies reviewed. Has been NPO today as directed.    Past Medical History:  Diagnosis Date  . Anxiety   . Chronic back pain   . Closed fracture of left olecranon process 08/11/2020  . DDD (degenerative disc disease), cervical   . DDD (degenerative disc disease), lumbosacral   . Depression   . GERD (gastroesophageal reflux disease)   . Hiatal hernia   . History of adenomatous polyp of colon    2009  tubular adenoma  . History of esophagitis   . History of idiopathic seizure    1984  x1 after vaginal delivery (per pt negative work-up and no issue since)  . History of left shoulder fracture    01/ 2014  proximal humerus fx  . Hyperlipidemia   . Left ulnar fracture 08/11/2020  . OA (osteoarthritis)    knees and thumbs  . RLS (restless legs syndrome)   . Supraumbilical hernia   . Umbilical hernia   . Wears dentures    lower    Past Surgical History:  Procedure Laterality Date  . APPLICATION OF WOUND VAC N/A 08/08/2020   Procedure: APPLICATION OF WOUND VAC;  Surgeon: Mickeal Skinner, MD;  Location: [place];  Service: General;  Laterality: N/A;  . CARDIOVASCULAR STRESS TEST  05/06/2010   Lexiscan nuclear study w/ no exercise/   probably normal , per images there is a large reversible defect in the mid to distal anterior wall, this seems to be shifting breast attenuation/  normal LV function and wall motion , ef 67%  . CARPAL TUNNEL RELEASE Bilateral 2011   excision ganglion cyst left wrist  . CATARACT EXTRACTION W/ INTRAOCULAR LENS  IMPLANT, BILATERAL  09 and 10/ 2017  . COLONOSCOPY  05/08/2008  . COLONOSCOPY N/A 09/15/2018   Procedure: COLONOSCOPY;  Surgeon: Rogene Houston, MD;  [facility];  Service: Endoscopy;  Laterality: N/A;  12:00  . LAPAROSCOPIC CHOLECYSTECTOMY  2012  . LAPAROTOMY N/A 08/08/2020   Procedure: EXPLORATORY LAPAROTOMY, ILEOCECECTOMY WITH ANASTOMOSIS, MECKELS RESSECTION;  Surgeon: Kinsinger, Arta Bruce, MD;  Location: [place];  Service: General;  Laterality: N/A;  . ORIF ELBOW FRACTURE Left 08/10/2020   Procedure: OPEN REDUCTION INTERNAL FIXATION (ORIF) OLECRANON and ULNA FRACTURE;  Surgeon: Altamese Keego Harbor, MD;  Location: [place];  Service: Orthopedics;  Laterality: Left;  . SHOULDER ARTHROSCOPY/  ACROMIOPLASTY/  DISTAL CLAVICAL RESECTION/  DEBRIDEMENT LABRAL TEAR Left 02/09/2005  . TONSILLECTOMY AND ADENOIDECTOMY  age 47  . TUBAL LIGATION Bilateral yrs ago  . VENTRAL HERNIA REPAIR N/A 12/31/2016   Procedure: LAPAROSCOPIC VENTRAL WALL HERNIA REPAIR WITH ERAS PATHWAY;  Surgeon: Michael Boston, MD;  Location: [place];  Service: General;  Laterality: N/A;    Allergies: Patient has no known allergies.  Medications:  Current  Facility-Administered Medications:  .  acetaminophen (TYLENOL) tablet 650 mg, 650 mg, Oral, Q6H PRN **OR** acetaminophen (TYLENOL) suppository 650 mg, 650 mg, Rectal, Q6H PRN, Reubin Milan, MD .  albuterol (PROVENTIL) (2.5 MG/3ML) 0.083% nebulizer solution 2.5 mg, 2.5 mg, Nebulization, Q2H PRN, Darliss Cheney, MD, 2.5 mg at 12/12/20 1949 .  albuterol (VENTOLIN HFA) 108 (90 Base) MCG/ACT inhaler 2 puff, 2 puff, Inhalation, Q4H PRN, Rizwan, Saima,  MD .  atorvastatin (LIPITOR) tablet 40 mg, 40 mg, Oral, Daily, Pahwani, Ravi, MD, 40 mg at 12/12/20 1005 .  cefdinir (OMNICEF) capsule 600 mg, 600 mg, Oral, Daily, Rizwan, Saima, MD .  doxycycline (VIBRA-TABS) tablet 100 mg, 100 mg, Oral, Q12H, Pahwani, Ravi, MD, 100 mg at 12/12/20 2015 .  famotidine (PEPCID) tablet 20 mg, 20 mg, Oral, QHS, Pahwani, Ravi, MD, 20 mg at 12/12/20 2015 .  FLUoxetine (PROZAC) capsule 80 mg, 80 mg, Oral, Daily, Pahwani, Ravi, MD, 80 mg at 12/12/20 1002 .  insulin aspart (novoLOG) injection 0-9 Units, 0-9 Units, Subcutaneous, TID WC, Debbe Odea, MD, 1 Units at 12/12/20 1753 .  oxyCODONE (Oxy IR/ROXICODONE) immediate release tablet 5 mg, 5 mg, Oral, Q4H PRN, Reubin Milan, MD .  pantoprazole (PROTONIX) EC tablet 40 mg, 40 mg, Oral, q morning - 10a, Pahwani, Ravi, MD, 40 mg at 12/12/20 1005 .  prochlorperazine (COMPAZINE) injection 5 mg, 5 mg, Intravenous, Q4H PRN, Reubin Milan, MD, 5 mg at 12/10/20 1218    Family History  Problem Relation Age of Onset  . Hypertension Mother   . Kidney disease Mother   . Diabetes Mother   . Pulmonary embolism Father 41  . Obesity Sister     Social History   Socioeconomic History  . Marital status: Married    Spouse name: Juanda Crumble  . Number of children: 1  . Years of education: 16  . Highest education level: GED or equivalent  Occupational History  . Occupation: diability  Tobacco Use  . Smoking status: Former Smoker    Packs/day: 0.50    Years: 40.00    Pack years: 20.00    Types: Cigarettes    Start date: 02/20/1973    Quit date: 06/16/2013    Years since quitting: 7.4  . Smokeless tobacco: Never Used  Vaping Use  . Vaping Use: Former  Substance and Sexual Activity  . Alcohol use: No  . Drug use: No  . Sexual activity: Not Currently  Other Topics Concern  . Not on file  Social History Narrative  . Not on file   Social Determinants of Health   Financial Resource Strain: Low Risk   . Difficulty  of Paying Living Expenses: Not hard at all  Food Insecurity: No Food Insecurity  . Worried About Charity fundraiser in the Last Year: Never true  . Ran Out of Food in the Last Year: Never true  Transportation Needs: No Transportation Needs  . Lack of Transportation (Medical): No  . Lack of Transportation (Non-Medical): No  Physical Activity: Inactive  . Days of Exercise per Week: 0 days  . Minutes of Exercise per Session: 0 min  Stress: No Stress Concern Present  . Feeling of Stress : Only a little  Social Connections: Moderately Integrated  . Frequency of Communication with Friends and Family: More than three times a week  . Frequency of Social Gatherings with Friends and Family: More than three times a week  . Attends Religious Services: 1 to 4 times per year  .  Active Member of Clubs or Organizations: No  . Attends Archivist Meetings: Never  . Marital Status: Married     Review of Systems: A 12 point ROS discussed and pertinent positives are indicated in the HPI above.  All other systems are negative.  Review of Systems  Vital Signs: BP (!) 103/47 (BP Location: Right Arm)   Pulse 86   Temp 98.4 F (36.9 C) (Oral)   Resp 18   Ht '5\' 5"'$  (1.651 m)   Wt 90.7 kg   SpO2 94%   BMI 33.28 kg/m   Physical Exam Constitutional:      General: She is not in acute distress.    Appearance: Normal appearance. She is not ill-appearing.  HENT:     Mouth/Throat:     Mouth: Mucous membranes are moist.     Pharynx: Oropharynx is clear.  Cardiovascular:     Rate and Rhythm: Normal rate and regular rhythm.     Heart sounds: Normal heart sounds.  Pulmonary:     Effort: Pulmonary effort is normal. No respiratory distress.     Comments: Diminished right basilar BS Skin:    General: Skin is warm and dry.  Neurological:     General: No focal deficit present.     Mental Status: She is alert and oriented to person, place, and time.  Psychiatric:        Mood and Affect: Mood  normal.        Thought Content: Thought content normal.        Judgment: Judgment normal.       Imaging: DG Chest 1 View  Result Date: 12/10/2020 CLINICAL DATA:  Status post thoracentesis EXAM: CHEST  1 VIEW COMPARISON:  Chest radiograph and chest CT December 09, 2020 FINDINGS: No pneumothorax. Right pleural effusion significantly smaller after thoracentesis. Fairly small right pleural effusion remains. There is mild atelectatic change in the right base. There is no edema or airspace opacity. Heart size and pulmonary vascularity are normal. There is aortic atherosclerosis. There is a focal hiatal type hernia. No bone lesions. IMPRESSION: No pneumothorax. Fairly small residual right pleural effusion. Right base atelectasis. Stable cardiac silhouette.  Hiatal type hernia present. Aortic Atherosclerosis (ICD10-I70.0). Electronically Signed   By: Lowella Grip III M.D.   On: 12/10/2020 15:08   DG Chest 2 View  Result Date: 12/11/2020 CLINICAL DATA:  65 year old female with hypoxia. EXAM: CHEST - 2 VIEW COMPARISON:  Chest radiograph dated 12/10/2020. FINDINGS: There is cardiomegaly with vascular congestion. There is a small right pleural effusion and right lung base atelectasis, increased since the prior radiograph. Pneumonia is not excluded clinical correlation is recommended. No pneumothorax. Atherosclerotic calcification of the aorta. No acute osseous pathology. IMPRESSION: 1. Cardiomegaly with mild vascular congestion. 2. Small right pleural effusion and right lung base atelectasis, increased since the prior radiograph. Electronically Signed   By: Anner Crete M.D.   On: 12/11/2020 17:08   CT Angio Chest PE W and/or Wo Contrast  Result Date: 12/10/2020 CLINICAL DATA:  PE suspected.  Shortness of breath. EXAM: CT ANGIOGRAPHY CHEST WITH CONTRAST TECHNIQUE: Multidetector CT imaging of the chest was performed using the standard protocol during bolus administration of intravenous contrast.  Multiplanar CT image reconstructions and MIPs were obtained to evaluate the vascular anatomy. CONTRAST:  125m OMNIPAQUE IOHEXOL 350 MG/ML SOLN COMPARISON:  08/08/2020 FINDINGS: Cardiovascular: Contrast injection is sufficient to demonstrate satisfactory opacification of the pulmonary arteries to the segmental level. There is no pulmonary embolus or  evidence of right heart strain. The size of the main pulmonary artery is normal. Heart size is normal, with no pericardial effusion. There are atherosclerotic changes of the thoracic aorta without evidence for dissection or aneurysm. Mediastinum/Nodes: -- No mediastinal lymphadenopathy. -- No hilar lymphadenopathy. -- No axillary lymphadenopathy. -- No supraclavicular lymphadenopathy. -- Normal thyroid gland where visualized. -  Unremarkable esophagus. Lungs/Pleura: There is a large right-sided pleural effusion with near complete collapse of the right lower lobe. There is no pneumothorax. There is shift of the mediastinum to the patient's left. There is a trace left-sided pleural effusion. There is atelectasis at the left lung base. There is an apparent large defect in the diaphragm laterally on the right (axial series 6, image 81). There is fluid tracking into the patient's right flank and upper abdomen. Upper Abdomen: Contrast bolus timing is not optimized for evaluation of the abdominal organs. There is a small volume of free fluid in the patient's upper abdomen. Musculoskeletal: There is a healing sternal body fracture. There is a fracture of the posterior eighth rib on the right with evidence for nonunion. Review of the MIP images confirms the above findings. IMPRESSION: 1. Large right-sided pleural effusion with near complete collapse of the right lower lobe and shift of the mediastinum to the patient's left. 2. Apparent large defect in the diaphragm laterally on the right. Surgical consultation is recommended. 3. Small volume free fluid in the patient's upper  abdomen. 4. Healing sternal body fracture. 5. Subacute fracture of the posterior eighth rib on the right with evidence for nonunion. Aortic Atherosclerosis (ICD10-I70.0). Electronically Signed   By: Constance Holster M.D.   On: 12/10/2020 00:52   US Abdomen Complete  Result Date: 12/12/2020 CLINICAL DATA:  Elevated liver function tests, history of cholecystectomy EXAM: ABDOMEN ULTRASOUND COMPLETE COMPARISON:  09/30/2020 FINDINGS: Gallbladder: Surgically absent Common bile duct: Diameter: 4 mm Liver: Heterogeneous increased liver echotexture without focal abnormality. No biliary dilation. Portal vein is patent on color Doppler imaging with normal direction of blood flow towards the liver. IVC: Not well visualized. Pancreas: Visualized portion unremarkable. Spleen: Size and appearance within normal limits. Right Kidney: Length: 8.9 cm. Echogenicity within normal limits. No mass or hydronephrosis visualized. Left Kidney: Length: 10.3 cm. Echogenicity within normal limits. No mass or hydronephrosis visualized. Abdominal aorta: No aneurysm visualized. Other findings: Trace ascites in the right upper quadrant. Bilateral pleural effusions are incidentally noted. IMPRESSION: 1. Heterogeneous increased echotexture of the liver without focal abnormality, which could reflect mild hepatic steatosis or early cirrhosis. No focal liver abnormality. 2. Trace right upper quadrant ascites. 3. Bilateral pleural effusions. Electronically Signed   By: Randa Ngo M.D.   On: 12/12/2020 17:31   DG CHEST PORT 1 VIEW  Result Date: 12/13/2020 CLINICAL DATA:  Shortness of breath, pleural effusion. EXAM: PORTABLE CHEST 1 VIEW COMPARISON:  12/11/2020 and prior. FINDINGS: No pneumothorax. Decreased right pleural effusion. Partially obscured cardiomediastinal silhouette. Interval increase in basilar predominant patchy opacities. IMPRESSION: Decreased right pleural effusion.  No pneumothorax. Increased basilar predominant opacities.  Electronically Signed   By: Primitivo Gauze M.D.   On: 12/13/2020 08:23   DG Chest Portable 1 View  Result Date: 12/09/2020 CLINICAL DATA:  Shortness of breath EXAM: PORTABLE CHEST 1 VIEW COMPARISON:  08/19/2020 FINDINGS: Cardiomegaly with aortic atherosclerosis. Probable right pleural effusion. Dense airspace disease at the right middle lobe and right base. Vascular congestion. No pneumothorax. IMPRESSION: 1. Cardiomegaly with vascular congestion. 2. Probable right pleural effusion with increased dense airspace disease  at the right middle lobe and right base, atelectasis versus pneumonia. Electronically Signed   By: Donavan Foil M.D.   On: 12/09/2020 21:06   US THORACENTESIS ASP PLEURAL SPACE W/IMG GUIDE  Result Date: 12/10/2020 INDICATION: RIGHT pleural effusion and shortness of breath EXAM: ULTRASOUND GUIDED DIAGNOSTIC AND THERAPEUTIC RIGHT THORACENTESIS MEDICATIONS: None. COMPLICATIONS: None immediate. PROCEDURE: An ultrasound guided thoracentesis was thoroughly discussed with the patient and questions answered. The benefits, risks, alternatives and complications were also discussed. The patient understands and wishes to proceed with the procedure. Written consent was obtained. Ultrasound was performed to localize and mark an adequate pocket of fluid in the RIGHT chest. The area was then prepped and draped in the normal sterile fashion. 1% Lidocaine was used for local anesthesia. Under ultrasound guidance a 8 French thoracentesis catheter was introduced. Thoracentesis was performed. The catheter was removed and a dressing applied. FINDINGS: A total of approximately 1.7 L of clear yellow RIGHT pleural fluid was removed. Samples were sent to the laboratory as requested by the clinical team. IMPRESSION: Successful ultrasound guided RIGHT thoracentesis yielding 1.7 L of pleural fluid. Electronically Signed   By: Lavonia Dana M.D.   On: 12/10/2020 15:20    Labs:  CBC: Recent Labs    12/10/20 0703  12/11/20 0220 12/12/20 0434 12/13/20 0453  WBC 11.4* 13.0* 10.3 11.1*  HGB 11.1* 10.4* 10.4* 10.3*  HCT 33.1* 31.0* 29.4* 30.7*  PLT 318 304 310 270    COAGS: Recent Labs    08/08/20 2100 08/09/20 0435  INR  --  1.1  APTT 20* 24    BMP: Recent Labs    08/17/20 1403 08/18/20 0245 08/19/20 0751 08/20/20 0406 12/09/20 2037 12/10/20 0703 12/11/20 0220  NA 134* 134* 134* 136 135 136 136  K 3.2* 3.3* 3.5 3.7 3.8 3.4* 4.4  CL 97* 97* 96* 98 102 103 105  CO2 '25 25 26 29 23 24 '$ 21*  GLUCOSE 94 90 97 107* 110* 89 74  BUN 6* 6* 7* '8 9 9 10  '$ CALCIUM 8.0* 7.8* 8.0* 8.2* 8.4* 8.1* 8.0*  CREATININE 0.64 0.64 0.62 0.59 0.59 0.62 0.69  GFRNONAA >60 >60 >60 >60 >60 >60 >60  GFRAA >60 >60 >60 >60  --   --   --     LIVER FUNCTION TESTS: Recent Labs    08/08/20 1806 12/09/20 2037 12/10/20 0703 12/11/20 0220  BILITOT 1.0 2.4* 1.7* 2.1*  AST 164* 274* 226* 241*  ALT 80* 116* 96* 90*  ALKPHOS 88 147* 127* 114  PROT 5.7* 7.5 6.4* 5.7*  ALBUMIN 2.9* 2.6* 2.4*  2.3* 1.9*    TUMOR MARKERS: No results for input(s): AFPTM, CEA, CA199, CHROMGRNA in the last 8760 hours.  Assessment and Plan: Recurrent right pleural effusion Plan for image guided chest tube placement Labs reviewed. Risks and benefits discussed with the patient including bleeding, infection, damage to adjacent structures, bowel perforation/fistula connection, and sepsis.  All of the patient's questions were answered, patient is agreeable to proceed. Consent signed and in chart.    Thank you for this interesting consult.  I greatly enjoyed meeting THOMASENE KURZAWA and look forward to participating in their care.  A copy of this report was sent to the requesting provider on this date.  Electronically Signed: Ascencion Dike, PA-C 12/13/2020, 10:10 AM   I spent a total of 20 minutes in face to face in clinical consultation, greater than 50% of which was counseling/coordinating care for chest tube

## 2020-12-13 NOTE — Progress Notes (Signed)
  Echocardiogram 2D Echocardiogram has been performed.  Marybelle Killings 12/13/2020, 2:47 PM

## 2020-12-13 NOTE — TOC CAGE-AID Note (Addendum)
In room for Cage aid assessment, pt not in room at this time
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
## 2020-12-13 NOTE — TOC CAGE-AID Note (Signed)
To room for Cage aid assessment, pt not in room at this time  Malachi Paradise, TRN

## 2020-12-13 NOTE — Procedures (Signed)
Interventional Radiology Procedure Note  Procedure: Image guided drain placement, right pleural space.  103F pigtail drain.  Complications: None  EBL: None   Recommendations: - Routine chest tube care  - routine wound care  Signed,  Dulcy Fanny. Earleen Newport, DO

## 2020-12-14 ENCOUNTER — Inpatient Hospital Stay (HOSPITAL_COMMUNITY): Payer: PPO

## 2020-12-14 DIAGNOSIS — R9431 Abnormal electrocardiogram [ECG] [EKG]: Secondary | ICD-10-CM

## 2020-12-14 DIAGNOSIS — I700 Atherosclerosis of aorta: Secondary | ICD-10-CM | POA: Diagnosis not present

## 2020-12-14 DIAGNOSIS — J90 Pleural effusion, not elsewhere classified: Secondary | ICD-10-CM | POA: Diagnosis not present

## 2020-12-14 DIAGNOSIS — Z86711 Personal history of pulmonary embolism: Secondary | ICD-10-CM

## 2020-12-14 DIAGNOSIS — E669 Obesity, unspecified: Secondary | ICD-10-CM

## 2020-12-14 DIAGNOSIS — F331 Major depressive disorder, recurrent, moderate: Secondary | ICD-10-CM | POA: Diagnosis not present

## 2020-12-14 DIAGNOSIS — K449 Diaphragmatic hernia without obstruction or gangrene: Secondary | ICD-10-CM

## 2020-12-14 LAB — COMPREHENSIVE METABOLIC PANEL
ALT: 119 U/L — ABNORMAL HIGH (ref 0–44)
AST: 340 U/L — ABNORMAL HIGH (ref 15–41)
Albumin: 1.9 g/dL — ABNORMAL LOW (ref 3.5–5.0)
Alkaline Phosphatase: 145 U/L — ABNORMAL HIGH (ref 38–126)
Anion gap: 12 (ref 5–15)
BUN: 15 mg/dL (ref 8–23)
CO2: 19 mmol/L — ABNORMAL LOW (ref 22–32)
Calcium: 8.2 mg/dL — ABNORMAL LOW (ref 8.9–10.3)
Chloride: 104 mmol/L (ref 98–111)
Creatinine, Ser: 0.65 mg/dL (ref 0.44–1.00)
GFR, Estimated: >60 mL/min (ref >60)
Glucose, Bld: 68 mg/dL — ABNORMAL LOW (ref 70–99)
Potassium: 4.5 mmol/L (ref 3.5–5.1)
Sodium: 135 mmol/L (ref 135–145)
Total Bilirubin: 1.9 mg/dL — ABNORMAL HIGH (ref 0.3–1.2)
Total Protein: 6 g/dL — ABNORMAL LOW (ref 6.5–8.1)

## 2020-12-14 LAB — CBC WITH DIFFERENTIAL/PLATELET
Abs Immature Granulocytes: 0.05 10*3/uL (ref 0.00–0.07)
Basophils Absolute: 0.1 10*3/uL (ref 0.0–0.1)
Basophils Relative: 1 %
Eosinophils Absolute: 0.2 10*3/uL (ref 0.0–0.5)
Eosinophils Relative: 1 %
HCT: 31.4 % — ABNORMAL LOW (ref 36.0–46.0)
Hemoglobin: 11.2 g/dL — ABNORMAL LOW (ref 12.0–15.0)
Immature Granulocytes: 0 %
Lymphocytes Relative: 15 %
Lymphs Abs: 2.3 10*3/uL (ref 0.7–4.0)
MCH: 27.8 pg (ref 26.0–34.0)
MCHC: 35.7 g/dL (ref 30.0–36.0)
MCV: 77.9 fL — ABNORMAL LOW (ref 80.0–100.0)
Monocytes Absolute: 0.9 10*3/uL (ref 0.1–1.0)
Monocytes Relative: 6 %
Neutro Abs: 11.4 10*3/uL — ABNORMAL HIGH (ref 1.7–7.7)
Neutrophils Relative %: 77 %
Platelets: 280 10*3/uL (ref 150–400)
RBC: 4.03 MIL/uL (ref 3.87–5.11)
RDW: 20.2 % — ABNORMAL HIGH (ref 11.5–15.5)
WBC: 14.9 10*3/uL — ABNORMAL HIGH (ref 4.0–10.5)
nRBC: 0 % (ref 0.0–0.2)

## 2020-12-14 LAB — GLUCOSE, CAPILLARY
Glucose-Capillary: 101 mg/dL — ABNORMAL HIGH (ref 70–99)
Glucose-Capillary: 103 mg/dL — ABNORMAL HIGH (ref 70–99)
Glucose-Capillary: 104 mg/dL — ABNORMAL HIGH (ref 70–99)
Glucose-Capillary: 77 mg/dL (ref 70–99)
Glucose-Capillary: 80 mg/dL (ref 70–99)
Glucose-Capillary: 81 mg/dL (ref 70–99)

## 2020-12-14 MED ORDER — SODIUM CHLORIDE 0.9 % IV BOLUS
500.0000 mL | Freq: Once | INTRAVENOUS | Status: AC
  Administered 2020-12-14: 500 mL via INTRAVENOUS

## 2020-12-14 MED ORDER — ALUM & MAG HYDROXIDE-SIMETH 200-200-20 MG/5 ML NICU TOPICAL: 1.0000 "application " | TOPICAL | Status: DC | PRN

## 2020-12-14 MED ORDER — ALUM & MAG HYDROXIDE-SIMETH 200-200-20 MG/5ML PO SUSP: 30.0000 mL | ORAL | Status: DC | PRN
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
## 2020-12-14 NOTE — Progress Notes (Signed)
Pt has not voided this shift, bladder scanned pt 152m in bladder. MD made aware new orders received.

## 2020-12-14 NOTE — Progress Notes (Signed)
Chest tube out put 1041m. MD Shaloub paged.

## 2020-12-14 NOTE — Consult Note (Signed)
WOC Nurse Consult Note: Reason for Consult:Midline wound, chronic, healing Wound type:Surgical Pressure Injury POA: N/A Measurement: To be obtained by Bedside RN today prior to placement of first dressing and documented on Nursing Flow Sheet (LxWxD in cm) Wound bed: Red, moist with evidence of hypergranulation Drainage (amount, consistency, odor) serous, moderate Periwound:intact with evidence of previous wound healing, contraction, scarring  Dressing procedure/placement/frequency: Surgery is following, and photo taken on admission on 1/25 is appreciated.  I will provide guidance for Nursing for topical care of this wound using a silver hydrofiber (Aquacel Advantage) as it will both absorb exudate and donate its antimicrobial properties which can contribute to effective management of hypergranulation tissue.  This will be changed daily by Nursing.  I have additionally provided guidance for routine pressure injury prevention by way of a sacral silicone foam dressing and floatation of the patient's heels while in bed.   [facility] nursing team will not follow, but will remain available to this patient, the nursing and medical teams.  Please re-consult if needed. Thanks, Maudie Flakes, MSN, RN, [place], Arther Abbott  Pager# [phone]

## 2020-12-14 NOTE — Progress Notes (Addendum)
      [address]       [place] [address]             [phone]      Subjective:  Patient having pain at chest tube site, overall feels like she is breathing better.    Objective: Vital signs in last 24 hours: Temp:  [98.1 F (36.7 C)-99.2 F (37.3 C)] 98.1 F (36.7 C) (01/29 0329) Pulse Rate:  [72-89] 81 (01/29 0329) Cardiac Rhythm: Normal sinus rhythm (01/28 1640) Resp:  [15-20] 17 (01/29 0329) BP: (94-117)/(44-82) 110/58 (01/29 0329) SpO2:  [86 %-98 %] 98 % (01/29 0329)  Intake/Output from previous day: 01/28 0701 - 01/29 0700 In: -  Out: 3400 [Chest Tube:3400] Intake/Output this shift: Total I/O In: -  Out: 50 [Chest Tube:50]  General appearance: alert, cooperative and no distress Heart: regular rate and rhythm Lungs: clear to auscultation bilaterally Wound: clean and dry  Lab Results: Recent Labs    12/13/20 0453 12/14/20 0107  WBC 11.1* 14.9*  HGB 10.3* 11.2*  HCT 30.7* 31.4*  PLT 270 280   BMET:  Recent Labs    12/14/20 0829  NA 135  K 4.5  CL 104  CO2 19*  GLUCOSE 68*  BUN 15  CREATININE 0.65  CALCIUM 8.2*    PT/INR:  Recent Labs    12/13/20 0941  LABPROT 18.4*  INR 1.6*   ABG    Component Value Date/Time   PHART 7.377 08/08/2020 2303   HCO3 23.3 08/08/2020 2303   TCO2 21 (L) 08/10/2020 1118   ACIDBASEDEF 2.0 08/08/2020 2303   O2SAT 96.0 08/08/2020 2303   CBG (last 3)  Recent Labs    12/13/20 2309 12/14/20 0326 12/14/20 0805  GLUCAP 99 81 77    Assessment/Plan:  1. Chest tube- evidently dumped 3L overnight, currently in pleurovac there is 1850 ml of yellow serous fluid, patient doesn't appear to be in any distress.  CT is on water seal w/o evidence of air leak... CXR wasn't ordered for today, will obtain. Also no documentation in regards to what time that pleurovac was initiated 2. ?Diaphragmatic injury- General surgery has been consulted and is following 3. Dispo- patient is currently stable, will  get CXR to assess fluid collection, depending on what time pleurovac was changed the patient has had close to 5L of fluid removed since pigtail was placed yesterday afternoon, seems like a large amount to be all pleural in origin... care per primary, gen surgery   LOS: 4 days    Ellwood Handler, PA-C 12/14/2020    Chart reviewed, patient examined, agree with above. Large amount of drainage from pigtail catheter. CXR looks good. I reviewed her CT abd and pelvis from 09/30/20. There is a lateral abdominal wall hernia just at the inferior border of liver that could be at junction of diaphragm with the abd/chest wall. Unclear if this led to right pleural effusion but if pigtail drainage remains high then I think we would have to consider if ascites could be leaking up through this defect.

## 2020-12-14 NOTE — Progress Notes (Addendum)
HOSPITAL MEDICINE OVERNIGHT EVENT NOTE    Notified by nursing that patient has put out a total of over 3 L of yellow turbid chest tube output since the procedure was performed earlier in the afternoon on 1/28.  Patient denies weakness, lightheadedness or shortness of breath.  She does complain of some incisional site pain.  Nursing reports that chest tube is currently set to -20cm of suction as directed by interventional radiology upon initial tube insertion.  Considering the extensive drainage, I am concerned that the patient may develop reexpansion pulmonary edema.  Alternatively, due to the suspected diaphragmatic defect mentioned in the notes patient may be pulling additional fluid from the abdominal cavity.  Case discussed with Dr. Earleen Newport with interventional radiology he agrees that suction should be changed to waterseal.  He also agrees that if patient develops any evidence of respiratory distress then a stat chest x-ray should be obtained.  We will continue to monitor patient closely.  Trauma and CT surgery are both currently following and will likely see patient in the morning and can reevaluate the chest tube then.  Lori Emerald  MD [facility]

## 2020-12-15 ENCOUNTER — Inpatient Hospital Stay (HOSPITAL_COMMUNITY): Payer: PPO

## 2020-12-15 DIAGNOSIS — I700 Atherosclerosis of aorta: Secondary | ICD-10-CM | POA: Diagnosis not present

## 2020-12-15 DIAGNOSIS — J90 Pleural effusion, not elsewhere classified: Secondary | ICD-10-CM | POA: Diagnosis not present

## 2020-12-15 DIAGNOSIS — E669 Obesity, unspecified: Secondary | ICD-10-CM | POA: Diagnosis not present

## 2020-12-15 DIAGNOSIS — F331 Major depressive disorder, recurrent, moderate: Secondary | ICD-10-CM | POA: Diagnosis not present

## 2020-12-15 LAB — COMPREHENSIVE METABOLIC PANEL
ALT: 132 U/L — ABNORMAL HIGH (ref 0–44)
AST: 404 U/L — ABNORMAL HIGH (ref 15–41)
Albumin: 1.7 g/dL — ABNORMAL LOW (ref 3.5–5.0)
Alkaline Phosphatase: 149 U/L — ABNORMAL HIGH (ref 38–126)
Anion gap: 9 (ref 5–15)
BUN: 15 mg/dL (ref 8–23)
CO2: 21 mmol/L — ABNORMAL LOW (ref 22–32)
Calcium: 7.9 mg/dL — ABNORMAL LOW (ref 8.9–10.3)
Chloride: 99 mmol/L (ref 98–111)
Creatinine, Ser: 0.77 mg/dL (ref 0.44–1.00)
GFR, Estimated: >60 mL/min (ref >60)
Glucose, Bld: 92 mg/dL (ref 70–99)
Potassium: 3.9 mmol/L (ref 3.5–5.1)
Sodium: 129 mmol/L — ABNORMAL LOW (ref 135–145)
Total Bilirubin: 2.4 mg/dL — ABNORMAL HIGH (ref 0.3–1.2)
Total Protein: 5.4 g/dL — ABNORMAL LOW (ref 6.5–8.1)

## 2020-12-15 LAB — CBC WITH DIFFERENTIAL/PLATELET
Abs Immature Granulocytes: 0.04 10*3/uL (ref 0.00–0.07)
Basophils Absolute: 0.1 10*3/uL (ref 0.0–0.1)
Basophils Relative: 1 %
Eosinophils Absolute: 0.2 10*3/uL (ref 0.0–0.5)
Eosinophils Relative: 2 %
HCT: 32 % — ABNORMAL LOW (ref 36.0–46.0)
Hemoglobin: 11.1 g/dL — ABNORMAL LOW (ref 12.0–15.0)
Immature Granulocytes: 0 %
Lymphocytes Relative: 13 %
Lymphs Abs: 1.6 10*3/uL (ref 0.7–4.0)
MCH: 26.7 pg (ref 26.0–34.0)
MCHC: 34.7 g/dL (ref 30.0–36.0)
MCV: 76.9 fL — ABNORMAL LOW (ref 80.0–100.0)
Monocytes Absolute: 0.9 10*3/uL (ref 0.1–1.0)
Monocytes Relative: 8 %
Neutro Abs: 9.2 10*3/uL — ABNORMAL HIGH (ref 1.7–7.7)
Neutrophils Relative %: 76 %
Platelets: 300 10*3/uL (ref 150–400)
RBC: 4.16 MIL/uL (ref 3.87–5.11)
RDW: 20.4 % — ABNORMAL HIGH (ref 11.5–15.5)
WBC: 12 10*3/uL — ABNORMAL HIGH (ref 4.0–10.5)
nRBC: 0.2 % (ref 0.0–0.2)

## 2020-12-15 LAB — GLUCOSE, CAPILLARY
Glucose-Capillary: 126 mg/dL — ABNORMAL HIGH (ref 70–99)
Glucose-Capillary: 82 mg/dL (ref 70–99)
Glucose-Capillary: 85 mg/dL (ref 70–99)
Glucose-Capillary: 95 mg/dL (ref 70–99)
Glucose-Capillary: 99 mg/dL (ref 70–99)
Glucose-Capillary: 99 mg/dL (ref 70–99)

## 2020-12-15 LAB — OSMOLALITY: Osmolality: 290 mOsm/kg (ref 275–295)

## 2020-12-15 LAB — CULTURE, BODY FLUID W GRAM STAIN -BOTTLE: Culture: NO GROWTH

## 2020-12-15 NOTE — Progress Notes (Signed)
Pt w/ brown, cloudy, sediment urine. Dr. Cyd Silence informed. I plan to collect next urine output d/t this collection in [facility] than an hr.
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
## 2020-12-15 NOTE — Progress Notes (Addendum)
      [address]       [place] [address]             [phone]      Subjective:  No new complaints.  Trying to take a nap  Objective: Vital signs in last 24 hours: Temp:  [98 F (36.7 C)-98.6 F (37 C)] 98.6 F (37 C) (01/30 0417) Pulse Rate:  [80-85] 80 (01/30 0417) Resp:  [18-20] 18 (01/30 0417) BP: (112-117)/(44-48) 115/48 (01/30 0417) SpO2:  [90 %-96 %] 90 % (01/30 0417)  Intake/Output from previous day: 01/29 0701 - 01/30 0700 In: 740 [P.O.:240; IV Piggyback:500] Out: 945 [Urine:200; Chest Tube:745] Intake/Output this shift: Total I/O In: 240 [P.O.:240] Out: -   General appearance: alert, cooperative and no distress Heart: regular rate and rhythm Lungs: clear to auscultation bilaterally Wound: clean and dry  Lab Results: Recent Labs    12/14/20 0107 12/15/20 0034  WBC 14.9* 12.0*  HGB 11.2* 11.1*  HCT 31.4* 32.0*  PLT 280 300   BMET:  Recent Labs    12/14/20 0829 12/15/20 0034  NA 135 129*  K 4.5 3.9  CL 104 99  CO2 19* 21*  GLUCOSE 68* 92  BUN 15 15  CREATININE 0.65 0.77  CALCIUM 8.2* 7.9*    PT/INR:  Recent Labs    12/13/20 0941  LABPROT 18.4*  INR 1.6*   ABG    Component Value Date/Time   PHART 7.377 08/08/2020 2303   HCO3 23.3 08/08/2020 2303   TCO2 21 (L) 08/10/2020 1118   ACIDBASEDEF 2.0 08/08/2020 2303   O2SAT 96.0 08/08/2020 2303   CBG (last 3)  Recent Labs    12/14/20 2353 12/15/20 0457 12/15/20 0733  GLUCAP 101* 126* 82    Assessment/Plan:  1. Chest tube- 400 cc output since I saw patient yesterday.. Pleurovac has been replaced with current level at 250 mark.   Fluid remains serous 2. Dispo- patient with drainage of pleural effusion, in the last 24 hours 400 cc was output, leave chest tube to water seal, monitor output closely, care per general surgery, primary   LOS: 5 days    Ellwood Handler, PA-C 12/15/2020   Chart reviewed, patient examined, agree with above. She has more haziness at  right base on CXR today that may be atelectasis or some undrained effusion. Continue tube to water seal. CXR in am. Dr. Servando Snare will reevaluate tomorrow.

## 2020-12-15 NOTE — Progress Notes (Signed)
Pt's daughter Desma Maxim called at this time for update. Pt aware and ok with [name] recieving updates.

## 2020-12-16 ENCOUNTER — Inpatient Hospital Stay (HOSPITAL_COMMUNITY): Payer: PPO

## 2020-12-16 DIAGNOSIS — J986 Disorders of diaphragm: Secondary | ICD-10-CM

## 2020-12-16 DIAGNOSIS — F331 Major depressive disorder, recurrent, moderate: Secondary | ICD-10-CM | POA: Diagnosis not present

## 2020-12-16 DIAGNOSIS — E669 Obesity, unspecified: Secondary | ICD-10-CM | POA: Diagnosis not present

## 2020-12-16 DIAGNOSIS — R945 Abnormal results of liver function studies: Secondary | ICD-10-CM | POA: Diagnosis not present

## 2020-12-16 DIAGNOSIS — J90 Pleural effusion, not elsewhere classified: Secondary | ICD-10-CM

## 2020-12-16 DIAGNOSIS — I700 Atherosclerosis of aorta: Secondary | ICD-10-CM | POA: Diagnosis not present

## 2020-12-16 LAB — HEPATITIS PANEL, ACUTE
HCV Ab: NONREACTIVE
Hep A IgM: NONREACTIVE
Hep B C IgM: NONREACTIVE
Hepatitis B Surface Ag: NONREACTIVE

## 2020-12-16 LAB — COMPREHENSIVE METABOLIC PANEL
ALT: 140 U/L — ABNORMAL HIGH (ref 0–44)
AST: 406 U/L — ABNORMAL HIGH (ref 15–41)
Albumin: 1.7 g/dL — ABNORMAL LOW (ref 3.5–5.0)
Alkaline Phosphatase: 158 U/L — ABNORMAL HIGH (ref 38–126)
Anion gap: 9 (ref 5–15)
BUN: 18 mg/dL (ref 8–23)
CO2: 22 mmol/L (ref 22–32)
Calcium: 8.2 mg/dL — ABNORMAL LOW (ref 8.9–10.3)
Chloride: 100 mmol/L (ref 98–111)
Creatinine, Ser: 0.78 mg/dL (ref 0.44–1.00)
GFR, Estimated: >60 mL/min (ref >60)
Glucose, Bld: 85 mg/dL (ref 70–99)
Potassium: 3.6 mmol/L (ref 3.5–5.1)
Sodium: 131 mmol/L — ABNORMAL LOW (ref 135–145)
Total Bilirubin: 2 mg/dL — ABNORMAL HIGH (ref 0.3–1.2)
Total Protein: 5.9 g/dL — ABNORMAL LOW (ref 6.5–8.1)

## 2020-12-16 LAB — CBC WITH DIFFERENTIAL/PLATELET
Abs Immature Granulocytes: 0.04 10*3/uL (ref 0.00–0.07)
Basophils Absolute: 0.1 10*3/uL (ref 0.0–0.1)
Basophils Relative: 1 %
Eosinophils Absolute: 0.3 10*3/uL (ref 0.0–0.5)
Eosinophils Relative: 2 %
HCT: 32.7 % — ABNORMAL LOW (ref 36.0–46.0)
Hemoglobin: 11.8 g/dL — ABNORMAL LOW (ref 12.0–15.0)
Immature Granulocytes: 0 %
Lymphocytes Relative: 16 %
Lymphs Abs: 2 10*3/uL (ref 0.7–4.0)
MCH: 27.2 pg (ref 26.0–34.0)
MCHC: 36.1 g/dL — ABNORMAL HIGH (ref 30.0–36.0)
MCV: 75.3 fL — ABNORMAL LOW (ref 80.0–100.0)
Monocytes Absolute: 1 10*3/uL (ref 0.1–1.0)
Monocytes Relative: 8 %
Neutro Abs: 9.3 10*3/uL — ABNORMAL HIGH (ref 1.7–7.7)
Neutrophils Relative %: 73 %
Platelets: 293 10*3/uL (ref 150–400)
RBC: 4.34 MIL/uL (ref 3.87–5.11)
RDW: 20.3 % — ABNORMAL HIGH (ref 11.5–15.5)
WBC: 12.7 10*3/uL — ABNORMAL HIGH (ref 4.0–10.5)
nRBC: 0 % (ref 0.0–0.2)

## 2020-12-16 LAB — GLUCOSE, CAPILLARY
Glucose-Capillary: 78 mg/dL (ref 70–99)
Glucose-Capillary: 72 mg/dL (ref 70–99)
Glucose-Capillary: 113 mg/dL — ABNORMAL HIGH (ref 70–99)
Glucose-Capillary: 117 mg/dL — ABNORMAL HIGH (ref 70–99)
Glucose-Capillary: 121 mg/dL — ABNORMAL HIGH (ref 70–99)
Glucose-Capillary: 108 mg/dL — ABNORMAL HIGH (ref 70–99)

## 2020-12-16 LAB — HEPARIN LEVEL (UNFRACTIONATED): Heparin Unfractionated: <0.1 IU/mL — ABNORMAL LOW (ref 0.30–0.70)

## 2020-12-16 LAB — APTT: aPTT: 38 seconds — ABNORMAL HIGH (ref 24–36)

## 2020-12-16 MED ORDER — HEPARIN (PORCINE) 25000 UT/250ML-% IV SOLN
2100.0000 [IU]/h | INTRAVENOUS | Status: DC
  Administered 2020-12-16: 1450 [IU]/h via INTRAVENOUS
  Administered 2020-12-17: 1600 [IU]/h via INTRAVENOUS
  Administered 2020-12-17: 2000 [IU]/h via INTRAVENOUS
  Administered 2020-12-18: 2100 [IU]/h via INTRAVENOUS
  Filled 2020-12-16 (×4): qty 250

## 2020-12-16 MED ORDER — LACTATED RINGERS IV BOLUS
500.0000 mL | Freq: Once | INTRAVENOUS | Status: AC
  Administered 2020-12-16: 500 mL via INTRAVENOUS

## 2020-12-16 MED ORDER — HEPARIN (PORCINE) 25000 UT/250ML-% IV SOLN
1450.0000 [IU]/h | INTRAVENOUS | Status: DC
  Filled 2020-12-16: qty 250

## 2020-12-16 NOTE — Progress Notes (Addendum)
[address]       [place] [address]             [phone]         Subjective: Awake and alert, having some discomfort at the pigtail catheter insertion site.  Denies abdominal pain, had a bowel movement this morning. No increased work of breathing on RA.   Objective: Vital signs in last 24 hours: Temp:  [97.9 F (36.6 C)-98.6 F (37 C)] 98.6 F (37 C) (01/31 0748) Pulse Rate:  [79-84] 79 (01/31 0748) Resp:  [18-20] 18 (01/31 0748) BP: (99-110)/(46-47) 103/47 (01/31 0748) SpO2:  [93 %-98 %] 97 % (01/31 0748)    Intake/Output from previous day: 01/30 0701 - 01/31 0700 In: 240 [P.O.:240] Out: 1800 [Chest Tube:1800] Intake/Output this shift: Total I/O In: 358 [P.O.:358] Out: -   General appearance: alert, cooperative and no distress Neurologic: intact Heart: regular rate and rhythm and soft systolic murmur unchanged.  Lungs: Breath sounds are clear. CXR reviewed, report below. Abdomen: soft, non-tender. palpable abdominal wll hernis right sied unchanged. Dressing to the midline incisional wound.  Wound: the pigtail catheter insertion site is covered with a dry dressing. All connections secure. The pigtail catheter drained ~1800 ml fluid over the past 24 hours. Ms. [employer] said the PleurEvac was changed early this morning to a new device. Currently has 31m clear, yellow serous fluid in the new PleurEvac.   Lab Results: Recent Labs    12/15/20 0034 12/16/20 0049  WBC 12.0* 12.7*  HGB 11.1* 11.8*  HCT 32.0* 32.7*  PLT 300 293   BMET:  Recent Labs    12/15/20 0034 12/16/20 0049  NA 129* 131*  K 3.9 3.6  CL 99 100  CO2 21* 22  GLUCOSE 92 85  BUN 15 18  CREATININE 0.77 0.78  CALCIUM 7.9* 8.2*    PT/INR: No results for input(s): LABPROT, INR in the last 72 hours. ABG    Component Value Date/Time   PHART 7.377 08/08/2020 2303   HCO3 23.3 08/08/2020 2303   TCO2 21 (L) 08/10/2020 1118   ACIDBASEDEF 2.0 08/08/2020 2303   O2SAT 96.0  08/08/2020 2303   CBG (last 3)  Recent Labs    12/15/20 2336 12/16/20 0340 12/16/20 0744  GLUCAP 85 78 72   EXAM: PORTABLE CHEST 1 VIEW  COMPARISON:  12/15/2020  FINDINGS: The heart size and mediastinal contours are within normal limits. No further visualized pneumothorax. No further visualized pleural fluid. Improved aeration of the right lung. There is no evidence of pulmonary edema or focal airspace disease. The visualized skeletal structures are unremarkable.  IMPRESSION: No further visualized right pleural fluid with improved aeration of the right lung. Resolved right pneumothorax.   Electronically Signed   By: GAletta EdouardM.D.   On: 12/16/2020 08:18  Assessment/Plan:  -Persistent drainage of pleural pigtail catheter despite resolution of the right pleural effusion on CXR. This remains suspicious for ascites draining through a diaphragmatic defect. Source of ascites is not clear. LFT's remain elevated but hepatitis panel all non-reactive. Echo showed normal LV and RV function, no significant valvular disease. The abdominal U/S suggested early hepatic steatosis or cirrhosis.  Source of fluid remains unclear.  Continue drainage and will discuss further workup.     LOS: 6 days    MAntony Odea PHershal [id number]/2022 Chest xray appears clear- but still drainage significant fluid- too much to remove chest tube yet I have seen and examined  Glo Herring and agree with the above assessment  and plan.  [name] MD Beeper [phone] Office [phone] 12/16/2020 5:54 PM
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
## 2020-12-16 NOTE — Progress Notes (Signed)
ANTICOAGULATION CONSULT NOTE - Initial Consult  Pharmacy Consult for heparin Indication: pulmonary embolus  No Known Allergies  Patient Measurements: Height: '5\' 5"'$  (165.1 cm) Weight: 90.7 kg (200 lb) IBW/kg (Calculated) : 57 Heparin Dosing Weight: 77.1 kg  Vital Signs: Temp: 98.5 F (36.9 C) (01/31 1358) Temp Source: Oral (01/31 1358) BP: 100/48 (01/31 1358) Pulse Rate: 81 (01/31 1358)  Labs: Recent Labs    12/14/20 0107 12/14/20 0829 12/15/20 0034 12/16/20 0049  HGB 11.2*  --  11.1* 11.8*  HCT 31.4*  --  32.0* 32.7*  PLT 280  --  300 293  CREATININE  --  0.65 0.77 0.78    Estimated Creatinine Clearance: 77 mL/min (by C-G formula based on SCr of 0.78 mg/dL).   Medical History: Past Medical History:  Diagnosis Date  . Anxiety   . Chronic back pain   . Closed fracture of left olecranon process 08/11/2020  . DDD (degenerative disc disease), cervical   . DDD (degenerative disc disease), lumbosacral   . Depression   . GERD (gastroesophageal reflux disease)   . Hiatal hernia   . History of adenomatous polyp of colon    2009  tubular adenoma  . History of esophagitis   . History of idiopathic seizure    1984  x1 after vaginal delivery (per pt negative work-up and no issue since)  . History of left shoulder fracture    01/ 2014  proximal humerus fx  . Hyperlipidemia   . Left ulnar fracture 08/11/2020  . OA (osteoarthritis)    knees and thumbs  . RLS (restless legs syndrome)   . Supraumbilical hernia   . Umbilical hernia   . Wears dentures    lower    Medications:  Medications Prior to Admission  Medication Sig Dispense Refill Last Dose  . albuterol (PROVENTIL HFA;VENTOLIN HFA) 108 (90 Base) MCG/ACT inhaler Inhale 2 puffs into the lungs every 6 (six) hours as needed for wheezing or shortness of breath.   12/09/2020 at Unknown time  . apixaban (ELIQUIS) 5 MG TABS tablet Take 1 tablet (5 mg total) by mouth 2 (two) times daily. (Patient taking differently: Take 5  mg by mouth every morning.) 180 tablet 2 12/09/2020 at 1000  . atorvastatin (LIPITOR) 40 MG tablet Take 1 tablet by mouth once daily 90 tablet 0 12/09/2020 at Unknown time  . budesonide-formoterol (SYMBICORT) 80-4.5 MCG/ACT inhaler Inhale 2 puffs by mouth twice daily (Patient taking differently: Inhale 2 puffs into the lungs 2 (two) times daily.) 11 g 1 12/09/2020 at Unknown time  . buPROPion (WELLBUTRIN XL) 150 MG 24 hr tablet Take 1 tablet by mouth once daily (Patient taking differently: Take 150 mg by mouth every morning.) 90 tablet 0 12/09/2020 at Unknown time  . busPIRone (BUSPAR) 5 MG tablet Take 1 tablet (5 mg total) by mouth 3 (three) times daily as needed. 90 tablet 1 12/09/2020 at Unknown time  . Cholecalciferol (VITAMIN D) 125 MCG (5000 UT) CAPS Take 1 capsule by mouth daily. 30 capsule 3 12/09/2020 at Unknown time  . cyclobenzaprine (FLEXERIL) 10 MG tablet Take 1 tablet by mouth three times daily as needed for muscle spasm (Patient taking differently: Take 10 mg by mouth at bedtime as needed for muscle spasms.) 60 tablet 0   . diclofenac Sodium (VOLTAREN) 1 % GEL Apply 1 application topically 2 (two) times daily as needed (pain).     . famotidine (PEPCID) 20 MG tablet Take 1 tablet (20 mg total) by mouth at bedtime.  90 tablet 0 12/09/2020 at Unknown time  . FLUoxetine (PROZAC) 40 MG capsule Take 2 capsules (80 mg total) by mouth daily. 180 capsule 0 12/09/2020 at Unknown time  . Multiple Vitamin (MULTIVITAMIN WITH MINERALS) TABS tablet Take 1 tablet by mouth every morning.   12/09/2020 at Unknown time  . oxyCODONE (OXY IR/ROXICODONE) 5 MG immediate release tablet Take 1-2 tablets (5-10 mg total) by mouth every 4 (four) hours as needed for moderate pain or severe pain ('5mg'$  moderate, '10mg'$  severe). 30 tablet 0   . pantoprazole (PROTONIX) 20 MG tablet Take 1 tablet by mouth once daily (Patient taking differently: Take 20 mg by mouth every morning.) 90 tablet 1 12/09/2020 at Unknown time     Assessment: Patient with history of PE in 2019 on once daily Eliquis PTA. Per Dr. Melvyn Novas 05/05/18: continue half dose for life but see a hematologist to consider discontinuation in the future. CT surgery is unsure if she will need surgery again and would prefer heparin for now. Pharmacy consulted to start heparin infusion for history of PE. Last dose of apixaban was on 1/27.  Goal of Therapy:  Heparin level 0.3-0.7 units/ml aPTT 66-102 seconds Monitor platelets by anticoagulation protocol: Yes   Plan:  Check baseline aPTT and heparin level Start heparin infusion at 1450 units/hr once levels drawn Check heparin level in 6 hours and daily while on heparin Continue to monitor H&H and platelets   Thank you for allowing [place] to participate in this patients care.   Jens Som, PharmD Please see amion for complete clinical pharmacist phone list. 12/16/2020 4:16 PM

## 2020-12-17 ENCOUNTER — Ambulatory Visit: Payer: PPO | Admitting: Family

## 2020-12-17 ENCOUNTER — Inpatient Hospital Stay (HOSPITAL_COMMUNITY): Payer: PPO

## 2020-12-17 ENCOUNTER — Encounter (HOSPITAL_COMMUNITY): Payer: Self-pay

## 2020-12-17 DIAGNOSIS — J90 Pleural effusion, not elsewhere classified: Secondary | ICD-10-CM

## 2020-12-17 DIAGNOSIS — K449 Diaphragmatic hernia without obstruction or gangrene: Secondary | ICD-10-CM | POA: Diagnosis not present

## 2020-12-17 DIAGNOSIS — Z86711 Personal history of pulmonary embolism: Secondary | ICD-10-CM | POA: Diagnosis not present

## 2020-12-17 DIAGNOSIS — R945 Abnormal results of liver function studies: Secondary | ICD-10-CM | POA: Diagnosis not present

## 2020-12-17 LAB — CBC WITH DIFFERENTIAL/PLATELET
Abs Immature Granulocytes: 0.05 10*3/uL (ref 0.00–0.07)
Basophils Absolute: 0.1 10*3/uL (ref 0.0–0.1)
Basophils Relative: 1 %
Eosinophils Absolute: 0.3 10*3/uL (ref 0.0–0.5)
Eosinophils Relative: 2 %
HCT: 32.6 % — ABNORMAL LOW (ref 36.0–46.0)
Hemoglobin: 11.3 g/dL — ABNORMAL LOW (ref 12.0–15.0)
Immature Granulocytes: 0 %
Lymphocytes Relative: 16 %
Lymphs Abs: 2.1 10*3/uL (ref 0.7–4.0)
MCH: 25.9 pg — ABNORMAL LOW (ref 26.0–34.0)
MCHC: 34.7 g/dL (ref 30.0–36.0)
MCV: 74.6 fL — ABNORMAL LOW (ref 80.0–100.0)
Monocytes Absolute: 1.1 10*3/uL — ABNORMAL HIGH (ref 0.1–1.0)
Monocytes Relative: 8 %
Neutro Abs: 9.7 10*3/uL — ABNORMAL HIGH (ref 1.7–7.7)
Neutrophils Relative %: 73 %
Platelets: 299 10*3/uL (ref 150–400)
RBC: 4.37 MIL/uL (ref 3.87–5.11)
RDW: 20.3 % — ABNORMAL HIGH (ref 11.5–15.5)
WBC: 13.4 10*3/uL — ABNORMAL HIGH (ref 4.0–10.5)
nRBC: 0 % (ref 0.0–0.2)

## 2020-12-17 LAB — HEPATITIS A ANTIBODY, TOTAL: hep A Total Ab: NONREACTIVE

## 2020-12-17 LAB — CK: Total CK: 35 U/L — ABNORMAL LOW (ref 38–234)

## 2020-12-17 LAB — GLUCOSE, CAPILLARY
Glucose-Capillary: 97 mg/dL (ref 70–99)
Glucose-Capillary: 86 mg/dL (ref 70–99)
Glucose-Capillary: 149 mg/dL — ABNORMAL HIGH (ref 70–99)
Glucose-Capillary: 90 mg/dL (ref 70–99)
Glucose-Capillary: 114 mg/dL — ABNORMAL HIGH (ref 70–99)
Glucose-Capillary: 121 mg/dL — ABNORMAL HIGH (ref 70–99)

## 2020-12-17 LAB — COMPREHENSIVE METABOLIC PANEL
ALT: 137 U/L — ABNORMAL HIGH (ref 0–44)
AST: 396 U/L — ABNORMAL HIGH (ref 15–41)
Albumin: 1.7 g/dL — ABNORMAL LOW (ref 3.5–5.0)
Alkaline Phosphatase: 158 U/L — ABNORMAL HIGH (ref 38–126)
Anion gap: 10 (ref 5–15)
BUN: 23 mg/dL (ref 8–23)
CO2: 19 mmol/L — ABNORMAL LOW (ref 22–32)
Calcium: 8.1 mg/dL — ABNORMAL LOW (ref 8.9–10.3)
Chloride: 99 mmol/L (ref 98–111)
Creatinine, Ser: 0.9 mg/dL (ref 0.44–1.00)
GFR, Estimated: >60 mL/min (ref >60)
Glucose, Bld: 94 mg/dL (ref 70–99)
Potassium: 3.6 mmol/L (ref 3.5–5.1)
Sodium: 128 mmol/L — ABNORMAL LOW (ref 135–145)
Total Bilirubin: 2.4 mg/dL — ABNORMAL HIGH (ref 0.3–1.2)
Total Protein: 5.8 g/dL — ABNORMAL LOW (ref 6.5–8.1)

## 2020-12-17 LAB — HEPARIN LEVEL (UNFRACTIONATED)
Heparin Unfractionated: <0.1 IU/mL — ABNORMAL LOW (ref 0.30–0.70)
Heparin Unfractionated: <0.1 IU/mL — ABNORMAL LOW (ref 0.30–0.70)
Heparin Unfractionated: 0.17 IU/mL — ABNORMAL LOW (ref 0.30–0.70)

## 2020-12-17 LAB — HEPATITIS B CORE ANTIBODY, TOTAL: Hep B Core Total Ab: REACTIVE — AB

## 2020-12-17 LAB — APTT: aPTT: 41 seconds — ABNORMAL HIGH (ref 24–36)

## 2020-12-17 LAB — BILIRUBIN, DIRECT: Bilirubin, Direct: 0.8 mg/dL — ABNORMAL HIGH (ref 0.0–0.2)

## 2020-12-17 MED ORDER — ALBUMIN HUMAN 25 % IV SOLN
12.5000 g | Freq: Once | INTRAVENOUS | Status: AC
  Administered 2020-12-17: 12.5 g via INTRAVENOUS
  Filled 2020-12-17: qty 50

## 2020-12-17 MED ORDER — FUROSEMIDE 20 MG PO TABS: 20.0000 mg | ORAL_TABLET | Freq: Every day | ORAL | Status: DC

## 2020-12-17 MED ORDER — SPIRONOLACTONE 25 MG PO TABS: 50.0000 mg | ORAL_TABLET | Freq: Every day | ORAL | Status: DC

## 2020-12-17 MED ORDER — SPIRONOLACTONE 25 MG PO TABS
50.0000 mg | ORAL_TABLET | Freq: Every day | ORAL | Status: DC
  Administered 2020-12-17 – 2020-12-19 (×3): 50 mg via ORAL
  Filled 2020-12-17 (×3): qty 2

## 2020-12-17 MED ORDER — FUROSEMIDE 20 MG PO TABS
20.0000 mg | ORAL_TABLET | Freq: Every day | ORAL | Status: DC
  Administered 2020-12-17 – 2020-12-20 (×4): 20 mg via ORAL
  Filled 2020-12-17 (×4): qty 1

## 2020-12-17 NOTE — Progress Notes (Signed)
ANTICOAGULATION CONSULT NOTE   Pharmacy Consult for Heparin Indication: pulmonary embolus  No Known Allergies  Patient Measurements: Height: '5\' 5"'$  (165.1 cm) Weight: 90.7 kg (200 lb) IBW/kg (Calculated) : 57 Heparin Dosing Weight: 77.1 kg  Vital Signs: Temp: 98.2 F (36.8 C) (01/31 2148) Temp Source: Oral (01/31 2148) BP: 104/40 (01/31 2148) Pulse Rate: 78 (01/31 2148)  Labs: Recent Labs    12/15/20 0034 12/16/20 0049 12/16/20 1649 12/17/20 0025  HGB 11.1* 11.8*  --  11.3*  HCT 32.0* 32.7*  --  32.6*  PLT 300 293  --  299  APTT  --   --  38*  --   HEPARINUNFRC  --   --  <0.10* <0.10*  CREATININE 0.77 0.78  --  0.90    Estimated Creatinine Clearance: 68.4 mL/min (by C-G formula based on SCr of 0.9 mg/dL).   Medical History: Past Medical History:  Diagnosis Date  . Anxiety   . Chronic back pain   . Closed fracture of left olecranon process 08/11/2020  . DDD (degenerative disc disease), cervical   . DDD (degenerative disc disease), lumbosacral   . Depression   . GERD (gastroesophageal reflux disease)   . Hiatal hernia   . History of adenomatous polyp of colon    2009  tubular adenoma  . History of esophagitis   . History of idiopathic seizure    1984  x1 after vaginal delivery (per pt negative work-up and no issue since)  . History of left shoulder fracture    01/ 2014  proximal humerus fx  . Hyperlipidemia   . Left ulnar fracture 08/11/2020  . OA (osteoarthritis)    knees and thumbs  . RLS (restless legs syndrome)   . Supraumbilical hernia   . Umbilical hernia   . Wears dentures    lower    Medications:  Medications Prior to Admission  Medication Sig Dispense Refill Last Dose  . albuterol (PROVENTIL HFA;VENTOLIN HFA) 108 (90 Base) MCG/ACT inhaler Inhale 2 puffs into the lungs every 6 (six) hours as needed for wheezing or shortness of breath.   12/09/2020 at Unknown time  . apixaban (ELIQUIS) 5 MG TABS tablet Take 1 tablet (5 mg total) by mouth 2 (two)  times daily. (Patient taking differently: Take 5 mg by mouth every morning.) 180 tablet 2 12/09/2020 at 1000  . atorvastatin (LIPITOR) 40 MG tablet Take 1 tablet by mouth once daily 90 tablet 0 12/09/2020 at Unknown time  . budesonide-formoterol (SYMBICORT) 80-4.5 MCG/ACT inhaler Inhale 2 puffs by mouth twice daily (Patient taking differently: Inhale 2 puffs into the lungs 2 (two) times daily.) 11 g 1 12/09/2020 at Unknown time  . buPROPion (WELLBUTRIN XL) 150 MG 24 hr tablet Take 1 tablet by mouth once daily (Patient taking differently: Take 150 mg by mouth every morning.) 90 tablet 0 12/09/2020 at Unknown time  . busPIRone (BUSPAR) 5 MG tablet Take 1 tablet (5 mg total) by mouth 3 (three) times daily as needed. 90 tablet 1 12/09/2020 at Unknown time  . Cholecalciferol (VITAMIN D) 125 MCG (5000 UT) CAPS Take 1 capsule by mouth daily. 30 capsule 3 12/09/2020 at Unknown time  . cyclobenzaprine (FLEXERIL) 10 MG tablet Take 1 tablet by mouth three times daily as needed for muscle spasm (Patient taking differently: Take 10 mg by mouth at bedtime as needed for muscle spasms.) 60 tablet 0   . diclofenac Sodium (VOLTAREN) 1 % GEL Apply 1 application topically 2 (two) times daily as needed (pain).     Marland Kitchen  famotidine (PEPCID) 20 MG tablet Take 1 tablet (20 mg total) by mouth at bedtime. 90 tablet 0 12/09/2020 at Unknown time  . FLUoxetine (PROZAC) 40 MG capsule Take 2 capsules (80 mg total) by mouth daily. 180 capsule 0 12/09/2020 at Unknown time  . Multiple Vitamin (MULTIVITAMIN WITH MINERALS) TABS tablet Take 1 tablet by mouth every morning.   12/09/2020 at Unknown time  . oxyCODONE (OXY IR/ROXICODONE) 5 MG immediate release tablet Take 1-2 tablets (5-10 mg total) by mouth every 4 (four) hours as needed for moderate pain or severe pain ('5mg'$  moderate, '10mg'$  severe). 30 tablet 0   . pantoprazole (PROTONIX) 20 MG tablet Take 1 tablet by mouth once daily (Patient taking differently: Take 20 mg by mouth every morning.) 90  tablet 1 12/09/2020 at Unknown time    Assessment: Patient with history of PE in 2019 on once daily Eliquis PTA. Per Dr. Melvyn Novas 05/05/18: continue half dose for life but see a hematologist to consider discontinuation in the future. CT surgery is unsure if she will need surgery again and would prefer heparin for now. Pharmacy consulted to start heparin infusion for history of PE. Last dose of apixaban was on 1/27.  2/1 AM update:  Heparin level undetectable No need for further aPTT monitoring   Goal of Therapy:  Heparin level 0.3-0.7 units/mL Monitor platelets by anticoagulation protocol: Yes   Plan:  Inc heparin to 1600 units/hr Check heparin level in 6-8 hours and daily while on heparin Continue to monitor H&H and platelets  Narda Bonds, PharmD, [place] Pharmacist Phone: [phone]

## 2020-12-17 NOTE — Progress Notes (Signed)
ANTICOAGULATION CONSULT NOTE   Pharmacy Consult for Heparin Indication: pulmonary embolus  No Known Allergies  Patient Measurements: Height: '5\' 5"'$  (165.1 cm) Weight: 90.7 kg (200 lb) IBW/kg (Calculated) : 57 Heparin Dosing Weight: 77.1 kg  Vital Signs: Temp: 98.2 F (36.8 C) (01/31 2148) Temp Source: Oral (01/31 2148) BP: 104/40 (01/31 2148) Pulse Rate: 78 (01/31 2148)  Labs: Recent Labs    12/15/20 0034 12/16/20 0049 12/16/20 1649 12/17/20 0025  HGB 11.1* 11.8*  --  11.3*  HCT 32.0* 32.7*  --  32.6*  PLT 300 293  --  299  APTT  --   --  38* 41*  HEPARINUNFRC  --   --  <0.10* <0.10*  CREATININE 0.77 0.78  --  0.90    Estimated Creatinine Clearance: 68.4 mL/min (by C-G formula based on SCr of 0.9 mg/dL).  Assessment: Patient with history of PE in 2019 on once daily Eliquis PTA. Per Dr. Melvyn Novas 05/05/18: continue half dose for life but see a hematologist to consider discontinuation in the future. CT surgery is unsure if she will need surgery again and would prefer heparin for now. Pharmacy consulted to start heparin infusion for history of PE. Last dose of apixaban was on 1/27.  Heparin level this afternoon is still SUBtherapeutic and undetectable desipte a rate increase earlier today (HL <0.1, goal of 0.3-0.7). CBC stable, no bleeding noted. RN double checked infusion site and running appropriately. If the next level is still undetectable - may need to ask the RN to switch sites.   Goal of Therapy:  Heparin level 0.3-0.7 units/mL Monitor platelets by anticoagulation protocol: Yes   Plan:  - Increase Heparin to 1800 units/hr (18 ml/hr) - Will continue to monitor for any signs/symptoms of bleeding and will follow up with heparin level in 6 hours   Thank you for allowing pharmacy to be a part of this patient's care.  Alycia Rossetti, PharmD, BCPS Clinical Pharmacist Clinical phone for 12/17/2020: [phone] 12/17/2020 8:05 AM   **Pharmacist phone directory can now be found  on [email or web address] (PW [email or web address]).  Listed under [place].

## 2020-12-17 NOTE — Progress Notes (Signed)
Referring Physician(s): Darlyn Read)  Supervising Physician: Daryll Brod  Patient Status:  [facility] - In-pt  Chief Complaint: Chest tube follow-up  Subjective:  History of recurrent right pleural effusion s/p right chest tube placement in IR 12/13/2020. Patient awake and alert laying in bed watching TV, no complaints. On RA. Right chest tube site c/d/i.  CXR 12/16/2020: 1. No further visualized right pleural fluid with improved aeration of the right lung. Resolved right pneumothorax.   Allergies: Patient has no known allergies.  Medications: Prior to Admission medications   Medication Sig Start Date End Date Taking? Authorizing Provider  albuterol (PROVENTIL HFA;VENTOLIN HFA) 108 (90 Base) MCG/ACT inhaler Inhale 2 puffs into the lungs every 6 (six) hours as needed for wheezing or shortness of breath.   Yes [provider]  apixaban (ELIQUIS) 5 MG TABS tablet Take 1 tablet (5 mg total) by mouth 2 (two) times daily. Patient taking differently: Take 5 mg by mouth every morning. 05/28/20  Yes Hawks, Alyse Low A, FNP  atorvastatin (LIPITOR) 40 MG tablet Take 1 tablet by mouth once daily 08/05/20  Yes Hawks, Colcord A, FNP  budesonide-formoterol (SYMBICORT) 80-4.5 MCG/ACT inhaler Inhale 2 puffs by mouth twice daily Patient taking differently: Inhale 2 puffs into the lungs 2 (two) times daily. 08/01/19  Yes Hawks, Christy A, FNP  buPROPion (WELLBUTRIN XL) 150 MG 24 hr tablet Take 1 tablet by mouth once daily Patient taking differently: Take 150 mg by mouth every morning. 07/08/20  Yes Hawks, Christy A, FNP  busPIRone (BUSPAR) 5 MG tablet Take 1 tablet (5 mg total) by mouth 3 (three) times daily as needed. 12/04/19  Yes Hawks, Christy A, FNP  Cholecalciferol (VITAMIN D) 125 MCG (5000 UT) CAPS Take 1 capsule by mouth daily. 08/23/20  Yes Ainsley Spinner, PA-C  cyclobenzaprine (FLEXERIL) 10 MG tablet Take 1 tablet by mouth three times daily as needed for muscle spasm Patient  taking differently: Take 10 mg by mouth at bedtime as needed for muscle spasms. 05/02/20  Yes Hawks, Christy A, FNP  diclofenac Sodium (VOLTAREN) 1 % GEL Apply 1 application topically 2 (two) times daily as needed (pain).   Yes [provider]  doxycycline (ADOXA) 100 MG tablet Take 1 tablet (100 mg total) by mouth 2 (two) times daily. 12/12/20  Yes Debbe Odea, MD  famotidine (PEPCID) 20 MG tablet Take 1 tablet (20 mg total) by mouth at bedtime. 03/01/20  Yes Hawks, Christy A, FNP  FLUoxetine (PROZAC) 40 MG capsule Take 2 capsules (80 mg total) by mouth daily. 12/05/20  Yes Hawks, Christy A, FNP  Multiple Vitamin (MULTIVITAMIN WITH MINERALS) TABS tablet Take 1 tablet by mouth every morning.   Yes [provider]  oxyCODONE (OXY IR/ROXICODONE) 5 MG immediate release tablet Take 1-2 tablets (5-10 mg total) by mouth every 4 (four) hours as needed for moderate pain or severe pain ('5mg'$  moderate, '10mg'$  severe). 08/22/20  Yes Saverio Danker, PA-C  pantoprazole (PROTONIX) 20 MG tablet Take 1 tablet by mouth once daily Patient taking differently: Take 20 mg by mouth every morning. 07/08/20  Yes Hawks, Christy A, FNP     Vital Signs: BP (!) 104/40 (BP Location: Right Arm)   Pulse 78   Temp 98.2 F (36.8 C) (Oral)   Resp 20   Ht '5\' 5"'$  (1.651 m)   Wt 200 lb (90.7 kg)   SpO2 100%   BMI 33.28 kg/m   Physical Exam Vitals and nursing note reviewed.  Constitutional:  General: She is not in acute distress.    Appearance: Normal appearance.  Pulmonary:     Effort: Pulmonary effort is normal. No respiratory distress.     Comments: On RA. Right chest tube site without tenderness, erythema, drainage, or active bleeding; approximately 1450 cc clear yellow fluid in pleure-vac, tube to water seal. No fluid in air leak window. Skin:    General: Skin is warm and dry.  Neurological:     Mental Status: She is alert and oriented to person, place, and time.     Imaging: US Abdomen  Limited  Result Date: 12/17/2020 CLINICAL DATA:  Ascites check EXAM: LIMITED ABDOMEN ULTRASOUND FOR ASCITES TECHNIQUE: Limited ultrasound survey for ascites was performed in all four abdominal quadrants. COMPARISON:  Ultrasound abdomen 12/12/2020. FINDINGS: Trace perisplenic free fluid. Limited by large overlying abdominal bandage. IMPRESSION: Trace perisplenic ascites. Limited by large overlying abdominal bandage. Electronically Signed   By: Iven Finn M.D.   On: 12/17/2020 05:25   DG CHEST PORT 1 VIEW  Result Date: 12/16/2020 CLINICAL DATA:  Status post right-sided pleural drainage catheter placement for pleural effusion on 12/13/2020 EXAM: PORTABLE CHEST 1 VIEW COMPARISON:  12/15/2020 FINDINGS: The heart size and mediastinal contours are within normal limits. No further visualized pneumothorax. No further visualized pleural fluid. Improved aeration of the right lung. There is no evidence of pulmonary edema or focal airspace disease. The visualized skeletal structures are unremarkable. IMPRESSION: No further visualized right pleural fluid with improved aeration of the right lung. Resolved right pneumothorax. Electronically Signed   By: Aletta Edouard M.D.   On: 12/16/2020 08:18   DG CHEST PORT 1 VIEW  Result Date: 12/15/2020 CLINICAL DATA:  , Shortness of breath, RIGHT apex pneumothorax post thoracostomy tube placement, hiatal hernia EXAM: PORTABLE CHEST 1 VIEW COMPARISON:  Portable exam 0656 hours compared to 12/14/2020 FINDINGS: Pigtail RIGHT thoracostomy tube again identified. Tiny RIGHT apex pneumothorax, slightly decreased from previous exam. Normal heart size and pulmonary vascularity. Density at the medial inferior LEFT hemithorax corresponds to hiatal hernia on a prior CT. Atherosclerotic calcification aorta. Pleural effusion and atelectasis at RIGHT lung base. Remaining lungs clear. No pneumothorax. Bones demineralized. IMPRESSION: Hiatal hernia. Tiny RIGHT apex pneumothorax, decreased from  previous exam. Persistent mild RIGHT basilar atelectasis and effusion. Electronically Signed   By: Lavonia Dana M.D.   On: 12/15/2020 09:22   DG CHEST PORT 1 VIEW  Result Date: 12/14/2020 CLINICAL DATA:  Recurrent right pleural effusion. EXAM: PORTABLE CHEST 1 VIEW COMPARISON:  12/13/2020 FINDINGS: Interval right pleural pigtail catheter. Mild residual ill-defined opacity at the right lung base. Approximately 5% right apical pneumothorax. Moderately large hiatal hernia. Borderline enlarged cardiac silhouette. Clear left lung. Cholecystectomy clips. No acute bony abnormality. IMPRESSION: 1. Approximately 5% right apical pneumothorax with a right pleural pigtail catheter in place. 2. Mild residual right basilar pleural fluid/atelectasis. 3. Moderately large hiatal hernia. Electronically Signed   By: Claudie Revering M.D.   On: 12/14/2020 12:17   ECHOCARDIOGRAM COMPLETE  Result Date: 12/13/2020    ECHOCARDIOGRAM REPORT   Patient Name:   Lori Cooper Date of Exam: 12/13/2020 Medical Rec #:  [id number]      Height:       65.0 in Accession #:    [id number]     Weight:       200.0 lb Date of Birth:  1956-02-12       BSA:          1.978 m Patient Age:  65 years       BP:           103/47 mmHg Patient Gender: F              HR:           82 bpm. Exam Location:  Inpatient Procedure: 2D Echo, Cardiac Doppler and Color Doppler Indications:    Dyspnea  History:        Patient has prior history of Echocardiogram examinations, most                 recent 03/11/2018. Risk Factors:Dyslipidemia. GERD.  Sonographer:    Clayton Lefort RDCS (AE) Referring Phys: [id number] [facility]  Sonographer Comments: Image acquisition challenging due to respiratory motion. IMPRESSIONS  1. Left ventricular ejection fraction, by estimation, is 60 to 65%. The left ventricle has normal function. The left ventricle has no regional wall motion abnormalities. There is mild left ventricular hypertrophy. Left ventricular diastolic parameters are consistent with  Grade I diastolic dysfunction (impaired relaxation).  2. Right ventricular systolic function is normal. The right ventricular size is normal. There is normal pulmonary artery systolic pressure. The estimated right ventricular systolic pressure is 99991111 mmHg.  3. The mitral valve is grossly normal. Trivial mitral valve regurgitation.  4. The aortic valve is tricuspid. Aortic valve regurgitation is not visualized. Mild aortic valve sclerosis is present, with no evidence of aortic valve stenosis.  5. The inferior vena cava is normal in size with greater than 50% respiratory variability, suggesting right atrial pressure of 3 mmHg. Comparison(s): Prior images unable to be directly viewed, comparison made by report only. Changes from prior study are noted. 03/11/2018: LVEF 60-65%. FINDINGS  Left Ventricle: Left ventricular ejection fraction, by estimation, is 60 to 65%. The left ventricle has normal function. The left ventricle has no regional wall motion abnormalities. The left ventricular internal cavity size was normal in size. There is  mild left ventricular hypertrophy. Left ventricular diastolic parameters are consistent with Grade I diastolic dysfunction (impaired relaxation). Indeterminate filling pressures. Right Ventricle: The right ventricular size is normal. No increase in right ventricular wall thickness. Right ventricular systolic function is normal. There is normal pulmonary artery systolic pressure. The tricuspid regurgitant velocity is 2.80 m/s, and  with an assumed right atrial pressure of 3 mmHg, the estimated right ventricular systolic pressure is 99991111 mmHg. Left Atrium: Left atrial size was normal in size. Right Atrium: Right atrial size was normal in size. Pericardium: There is no evidence of pericardial effusion. Mitral Valve: The mitral valve is grossly normal. Trivial mitral valve regurgitation. Tricuspid Valve: The tricuspid valve is grossly normal. Tricuspid valve regurgitation is trivial. Aortic  Valve: The aortic valve is tricuspid. Aortic valve regurgitation is not visualized. Mild aortic valve sclerosis is present, with no evidence of aortic valve stenosis. Aortic valve mean gradient measures 6.0 mmHg. Aortic valve peak gradient measures 12.4 mmHg. Aortic valve area, by VTI measures 2.28 cm. Pulmonic Valve: The pulmonic valve was normal in structure. Pulmonic valve regurgitation is not visualized. Aorta: The aortic root and ascending aorta are structurally normal, with no evidence of dilitation. Venous: The inferior vena cava is normal in size with greater than 50% respiratory variability, suggesting right atrial pressure of 3 mmHg. IAS/Shunts: No atrial level shunt detected by color flow Doppler.  LEFT VENTRICLE PLAX 2D LVIDd:         4.80 cm  Diastology LVIDs:         3.10 cm  LV  e' medial:    7.18 cm/s LV PW:         1.40 cm  LV E/e' medial:  15.7 LV IVS:        1.20 cm  LV e' lateral:   10.80 cm/s LVOT diam:     1.90 cm  LV E/e' lateral: 10.5 LV SV:         85 LV SV Index:   43 LVOT Area:     2.84 cm  RIGHT VENTRICLE             IVC RV Basal diam:  3.10 cm     IVC diam: 1.50 cm RV S prime:     14.10 cm/s TAPSE (M-mode): 2.3 cm LEFT ATRIUM           Index       RIGHT ATRIUM           Index LA diam:      3.70 cm 1.87 cm/m  RA Area:     15.00 cm LA Vol (A2C): 54.6 ml 27.60 ml/m RA Volume:   36.30 ml  18.35 ml/m LA Vol (A4C): 41.3 ml 20.88 ml/m  AORTIC VALVE AV Area (Vmax):    2.35 cm AV Area (Vmean):   2.28 cm AV Area (VTI):     2.28 cm AV Vmax:           176.00 cm/s AV Vmean:          119.000 cm/s AV VTI:            0.375 m AV Peak Grad:      12.4 mmHg AV Mean Grad:      6.0 mmHg LVOT Vmax:         146.00 cm/s LVOT Vmean:        95.500 cm/s LVOT VTI:          0.301 m LVOT/AV VTI ratio: 0.80  AORTA Ao Root diam: 3.10 cm Ao Asc diam:  3.20 cm MITRAL VALVE                TRICUSPID VALVE MV Area (PHT): 3.91 cm     TR Peak grad:   31.4 mmHg MV Decel Time: 194 msec     TR Vmax:        280.00 cm/s  MV E velocity: 113.00 cm/s MV A velocity: 109.00 cm/s  SHUNTS MV E/A ratio:  1.04         Systemic VTI:  0.30 m                             Systemic Diam: 1.90 cm Lyman Bishop MD Electronically signed by Lyman Bishop MD Signature Date/Time: 12/13/2020/3:16:40 PM    Final    IR PERC PLEURAL DRAIN W/INDWELL CATH W/IMG GUIDE  Result Date: 12/17/2020 INDICATION: 65 year old female with a history right-sided pleural effusion.  EXAM: IMAGE GUIDED PLACEMENT OF RIGHT-SIDED PLEURAL DRAIN  MEDICATIONS: The patient is currently admitted to the hospital and receiving intravenous antibiotics. The antibiotics were administered within an appropriate time frame prior to the initiation of the procedure.  ANESTHESIA/SEDATION: Fentanyl 25 mcg IV; Versed 0.5 mg IV  Moderate Sedation Time:  11 minutes  The patient was continuously monitored during the procedure by the interventional radiology nurse under my direct supervision.  COMPLICATIONS: None  PROCEDURE: The procedure, risks, benefits, and alternatives were explained to the patient/patient's family, who provided informed consent on the patient's behalf.  Specific risks that were addressed included bleeding, infection, ongoing pneumothorax, need for further procedure/surgery, chance of hemorrhage, hemoptysis, cardiopulmonary collapse, death. Questions regarding the procedure were encouraged and answered. The patient understands and consents to the procedure.  Patient was positioned in the right anterior oblique position on the IR table and scout image of the chest was performed for planning purposes.  The right mid axillary line at the level of the nipple was identified, and prepped and draped in the usual sterile fashion. The skin and subcutaneous tissues were generously infiltrated 1% lidocaine for local anesthesia.  A trocar needle was then used to enter the pleural space using ultrasound guidance. The inner trocar introducer was removed and an 035 guidewire was  advanced to the apex of the lung under fluoroscopy. Dilation of the skin tract was performed over the wire, and then modified Seldinger technique was used to place a 10 French pigtail catheter into the pleural space.  Catheter was attached to water seal chamber and suction was applied confirming a operational chest tube.  Retention suture was placed.  Sterile dressing was placed.  Patient tolerated the procedure well and remained hemodynamically stable throughout.  No complications were encountered and no significant blood loss was encounter  IMPRESSION: Status post image guided right-sided pleural drainage catheter.  Signed,  Dulcy Fanny. Dellia Nims, RPVI  [facility]  [facility]   Electronically Signed   By: Corrie Mckusick D.O.   On: 12/13/2020 18:04   Labs:  CBC: Recent Labs    12/14/20 0107 12/15/20 0034 12/16/20 0049 12/17/20 0025  WBC 14.9* 12.0* 12.7* 13.4*  HGB 11.2* 11.1* 11.8* 11.3*  HCT 31.4* 32.0* 32.7* 32.6*  PLT 280 300 293 299    COAGS: Recent Labs    08/08/20 2100 08/09/20 0435 12/13/20 0941 12/16/20 1649 12/17/20 0025  INR  --  1.1 1.6*  --   --   APTT 20* 24  --  38* 41*    BMP: Recent Labs    08/17/20 1403 08/18/20 0245 08/19/20 0751 08/20/20 0406 12/09/20 2037 12/14/20 0829 12/15/20 0034 12/16/20 0049 12/17/20 0025  NA 134* 134* 134* 136   < > 135 129* 131* 128*  K 3.2* 3.3* 3.5 3.7   < > 4.5 3.9 3.6 3.6  CL 97* 97* 96* 98   < > 104 99 100 99  CO2 '25 25 26 29   '$ < > 19* 21* 22 19*  GLUCOSE 94 90 97 107*   < > 68* 92 85 94  BUN 6* 6* 7* 8   < > '15 15 18 23  '$ CALCIUM 8.0* 7.8* 8.0* 8.2*   < > 8.2* 7.9* 8.2* 8.1*  CREATININE 0.64 0.64 0.62 0.59   < > 0.65 0.77 0.78 0.90  GFRNONAA >60 >60 >60 >60   < > >60 >60 >60 >60  GFRAA >60 >60 >60 >60  --   --   --   --   --    < > = values in this interval not displayed.    LIVER FUNCTION TESTS: Recent Labs    12/14/20 0829 12/15/20 0034  12/16/20 0049 12/17/20 0025  BILITOT 1.9* 2.4* 2.0* 2.4*  AST 340* 404* 406* 396*  ALT 119* 132* 140* 137*  ALKPHOS 145* 149* 158* 158*  PROT 6.0* 5.4* 5.9* 5.8*  ALBUMIN 1.9* 1.7* 1.7* 1.7*    Assessment and Plan:  History of recurrent right pleural effusion s/p right chest tube placement in IR 12/13/2020.  Right chest tube stable with approximately 1450 cc clear yellow fluid in pleure-vac, tube to water seal. No fluid in air leak window- RN to order and place new pleure-vac. Continue current chest tube management- chest tube to water seal, continue with serial CXR, further management of chest tube per TCTS. Further plans per TRH/TCTS- appreciate and agree with management. IR will continue to follow along peripherally, please call IR with questions/concerns.   Electronically Signed: Earley Abide, PA-C 12/17/2020, 10:34 AM   I spent a total of 25 Minutes at the the patient's bedside AND on the patient's hospital floor or unit, greater than 50% of which was counseling/coordinating care for right pleural effusion s/p right chest tube placement.

## 2020-12-17 NOTE — Care Management Important Message (Signed)
Important Message  Patient Details  Name: Lori Cooper MRN: [id number] Date of Birth: [birth year]   Medicare Important Message Given:  Yes     Orbie Pyo 12/17/2020, 2:04 PM
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
## 2020-12-17 NOTE — Progress Notes (Addendum)
      [address]       [place] [address]             [phone]         Subjective: Awake and alert. No new concerns today.    Objective: Vital signs in last 24 hours: Temp:  [98.2 F (36.8 C)-98.6 F (37 C)] 98.2 F (36.8 C) (01/31 2148) Pulse Rate:  [78-81] 78 (01/31 2148) Cardiac Rhythm: Normal sinus rhythm (01/31 0830) Resp:  [18-20] 20 (01/31 2148) BP: (100-104)/(40-48) 104/40 (01/31 2148) SpO2:  [97 %-100 %] 100 % (01/31 2148)    Intake/Output from previous day: 01/31 0701 - 02/01 0700 In: 867.1 [P.O.:358; IV Piggyback:509.1] Out: 780 [Chest Tube:780] Intake/Output this shift: No intake/output data recorded.  General appearance: alert, cooperative and no distress Neurologic: intact Heart: regular rate and rhythm Lungs: Breath sounds are clear bilaterally. Abdomen: soft, non-tender. palpable abdominal wll hernia right side unchanged.   Wound: the pigtail catheter insertion site is covered with a dry dressing. All connections secure. The pigtail catheter drained ~800 ml thin, clear, yellow fluid fluid over the past 24 hours.   Lab Results: Recent Labs    12/16/20 0049 12/17/20 0025  WBC 12.7* 13.4*  HGB 11.8* 11.3*  HCT 32.7* 32.6*  PLT 293 299   BMET:  Recent Labs    12/16/20 0049 12/17/20 0025  NA 131* 128*  K 3.6 3.6  CL 100 99  CO2 22 19*  GLUCOSE 85 94  BUN 18 23  CREATININE 0.78 0.90  CALCIUM 8.2* 8.1*    PT/INR: No results for input(s): LABPROT, INR in the last 72 hours. ABG    Component Value Date/Time   PHART 7.377 08/08/2020 2303   HCO3 23.3 08/08/2020 2303   TCO2 21 (L) 08/10/2020 1118   ACIDBASEDEF 2.0 08/08/2020 2303   O2SAT 96.0 08/08/2020 2303   CBG (last 3)  Recent Labs    12/16/20 2310 12/17/20 0358 12/17/20 0732  GLUCAP 108* 97 86   EXAM:   Assessment/Plan:  -Persistent drainage of pleural pigtail catheter despite resolution of the right pleural effusion on yesterday's CXR. Repeat abd U/S  yesterday did not show any significant ascites. Evaluation by the GI service is underway. Continue drainage of the left pleural space with the pigtail catheter.  Recheck the CXR in AM.    LOS: 7 days    Antony Odea, PA-C [phone] 12/17/2020  Ct tube output decreasing , but need to leave drainage tube  until less  I have seen and examined Lori Cooper and agree with the above assessment  and plan.  [name] MD Beeper [phone] Office [phone] 12/17/2020 5:35 PM
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
## 2020-12-17 NOTE — Plan of Care (Signed)
  Problem: Education: Goal: Knowledge of General Education information will improve Description Including pain rating scale, medication(s)/side effects and non-pharmacologic comfort measures Outcome: Progressing   

## 2020-12-17 NOTE — Progress Notes (Signed)
ANTICOAGULATION CONSULT NOTE   Pharmacy Consult for Heparin Indication: pulmonary embolus  No Known Allergies  Patient Measurements: Height: '5\' 5"'$  (165.1 cm) Weight: 90.7 kg (200 lb) IBW/kg (Calculated) : 57 Heparin Dosing Weight: 77.1 kg  Vital Signs: Temp: 97.9 F (36.6 C) (02/01 2055) Temp Source: Oral (02/01 2055) BP: 111/42 (02/01 2055) Pulse Rate: 83 (02/01 2055)  Labs: Recent Labs    12/15/20 0034 12/16/20 0049 12/16/20 1649 12/16/20 1649 12/17/20 0025 12/17/20 0613 12/17/20 1326 12/17/20 2143  HGB 11.1* 11.8*  --   --  11.3*  --   --   --   HCT 32.0* 32.7*  --   --  32.6*  --   --   --   PLT 300 293  --   --  299  --   --   --   APTT  --   --  38*  --  41*  --   --   --   HEPARINUNFRC  --   --  <0.10*   < > <0.10*  --  <0.10* 0.17*  CREATININE 0.77 0.78  --   --  0.90  --   --   --   CKTOTAL  --   --   --   --   --  35*  --   --    < > = values in this interval not displayed.    Estimated Creatinine Clearance: 68.4 mL/min (by C-G formula based on SCr of 0.9 mg/dL).  Assessment: Patient with history of PE in 2019 on once daily Eliquis PTA (Last dose of apixaban was on 1/27). Per Dr. Melvyn Novas 05/05/18: continue half dose for life but see a hematologist to consider discontinuation in the future. CT surgery is unsure if she will need surgery again and would prefer heparin for now. -heparin level up to 0.17 on 1800 units/hr  -chest tub in place 780 ml out   Goal of Therapy:  Heparin level 0.3-0.7 units/mL Monitor platelets by anticoagulation protocol: Yes   Plan:  -Increase heparin to 2000 units/hr -Heparin level in 6 hours and daily wth CBC daily  Hildred Laser, PharmD Clinical Pharmacist **Pharmacist phone directory can now be found on [email or web address] ([email or web address]).  Listed under [place].

## 2020-12-18 ENCOUNTER — Inpatient Hospital Stay (HOSPITAL_COMMUNITY): Payer: PPO

## 2020-12-18 DIAGNOSIS — E8809 Other disorders of plasma-protein metabolism, not elsewhere classified: Secondary | ICD-10-CM

## 2020-12-18 DIAGNOSIS — I700 Atherosclerosis of aorta: Secondary | ICD-10-CM | POA: Diagnosis not present

## 2020-12-18 DIAGNOSIS — R1319 Other dysphagia: Secondary | ICD-10-CM

## 2020-12-18 DIAGNOSIS — J90 Pleural effusion, not elsewhere classified: Secondary | ICD-10-CM | POA: Diagnosis not present

## 2020-12-18 DIAGNOSIS — R945 Abnormal results of liver function studies: Secondary | ICD-10-CM | POA: Diagnosis not present

## 2020-12-18 DIAGNOSIS — E440 Moderate protein-calorie malnutrition: Secondary | ICD-10-CM | POA: Diagnosis present

## 2020-12-18 DIAGNOSIS — R188 Other ascites: Secondary | ICD-10-CM

## 2020-12-18 DIAGNOSIS — S2239XG Fracture of one rib, unspecified side, subsequent encounter for fracture with delayed healing: Secondary | ICD-10-CM

## 2020-12-18 DIAGNOSIS — R7989 Other specified abnormal findings of blood chemistry: Secondary | ICD-10-CM

## 2020-12-18 DIAGNOSIS — R1310 Dysphagia, unspecified: Secondary | ICD-10-CM | POA: Diagnosis not present

## 2020-12-18 DIAGNOSIS — S31109A Unspecified open wound of abdominal wall, unspecified quadrant without penetration into peritoneal cavity, initial encounter: Secondary | ICD-10-CM | POA: Diagnosis present

## 2020-12-18 DIAGNOSIS — I959 Hypotension, unspecified: Secondary | ICD-10-CM | POA: Diagnosis present

## 2020-12-18 DIAGNOSIS — J9601 Acute respiratory failure with hypoxia: Secondary | ICD-10-CM | POA: Diagnosis not present

## 2020-12-18 DIAGNOSIS — E871 Hypo-osmolality and hyponatremia: Secondary | ICD-10-CM | POA: Diagnosis present

## 2020-12-18 DIAGNOSIS — R7401 Elevation of levels of liver transaminase levels: Secondary | ICD-10-CM | POA: Diagnosis present

## 2020-12-18 LAB — GLUCOSE, CAPILLARY
Glucose-Capillary: 115 mg/dL — ABNORMAL HIGH (ref 70–99)
Glucose-Capillary: 109 mg/dL — ABNORMAL HIGH (ref 70–99)
Glucose-Capillary: 111 mg/dL — ABNORMAL HIGH (ref 70–99)
Glucose-Capillary: 158 mg/dL — ABNORMAL HIGH (ref 70–99)
Glucose-Capillary: 117 mg/dL — ABNORMAL HIGH (ref 70–99)
Glucose-Capillary: 121 mg/dL — ABNORMAL HIGH (ref 70–99)

## 2020-12-18 LAB — COMPREHENSIVE METABOLIC PANEL
ALT: 130 U/L — ABNORMAL HIGH (ref 0–44)
AST: 412 U/L — ABNORMAL HIGH (ref 15–41)
Albumin: 1.8 g/dL — ABNORMAL LOW (ref 3.5–5.0)
Alkaline Phosphatase: 164 U/L — ABNORMAL HIGH (ref 38–126)
Anion gap: 8 (ref 5–15)
BUN: 21 mg/dL (ref 8–23)
CO2: 22 mmol/L (ref 22–32)
Calcium: 8.2 mg/dL — ABNORMAL LOW (ref 8.9–10.3)
Chloride: 100 mmol/L (ref 98–111)
Creatinine, Ser: 1.04 mg/dL — ABNORMAL HIGH (ref 0.44–1.00)
GFR, Estimated: 59 mL/min — ABNORMAL LOW (ref >60)
Glucose, Bld: 103 mg/dL — ABNORMAL HIGH (ref 70–99)
Potassium: 3.5 mmol/L (ref 3.5–5.1)
Sodium: 130 mmol/L — ABNORMAL LOW (ref 135–145)
Total Bilirubin: 2.4 mg/dL — ABNORMAL HIGH (ref 0.3–1.2)
Total Protein: 5.8 g/dL — ABNORMAL LOW (ref 6.5–8.1)

## 2020-12-18 LAB — HEPARIN LEVEL (UNFRACTIONATED)
Heparin Unfractionated: 0.28 IU/mL — ABNORMAL LOW (ref 0.30–0.70)
Heparin Unfractionated: 0.65 IU/mL (ref 0.30–0.70)
Heparin Unfractionated: 1.9 IU/mL — ABNORMAL HIGH (ref 0.30–0.70)

## 2020-12-18 LAB — ANA W/REFLEX IF POSITIVE: Anti Nuclear Antibody (ANA): NEGATIVE

## 2020-12-18 LAB — CBC WITH DIFFERENTIAL/PLATELET
Abs Immature Granulocytes: 0.05 10*3/uL (ref 0.00–0.07)
Basophils Absolute: 0.1 10*3/uL (ref 0.0–0.1)
Basophils Relative: 1 %
Eosinophils Absolute: 0.5 10*3/uL (ref 0.0–0.5)
Eosinophils Relative: 4 %
HCT: 30.8 % — ABNORMAL LOW (ref 36.0–46.0)
Hemoglobin: 11.2 g/dL — ABNORMAL LOW (ref 12.0–15.0)
Immature Granulocytes: 0 %
Lymphocytes Relative: 21 %
Lymphs Abs: 2.6 10*3/uL (ref 0.7–4.0)
MCH: 27.2 pg (ref 26.0–34.0)
MCHC: 36.4 g/dL — ABNORMAL HIGH (ref 30.0–36.0)
MCV: 74.8 fL — ABNORMAL LOW (ref 80.0–100.0)
Monocytes Absolute: 1.2 10*3/uL — ABNORMAL HIGH (ref 0.1–1.0)
Monocytes Relative: 10 %
Neutro Abs: 7.8 10*3/uL — ABNORMAL HIGH (ref 1.7–7.7)
Neutrophils Relative %: 64 %
Platelets: 287 10*3/uL (ref 150–400)
RBC: 4.12 MIL/uL (ref 3.87–5.11)
RDW: 20.5 % — ABNORMAL HIGH (ref 11.5–15.5)
WBC: 12.2 10*3/uL — ABNORMAL HIGH (ref 4.0–10.5)
nRBC: 0 % (ref 0.0–0.2)

## 2020-12-18 LAB — ANTI-SMOOTH MUSCLE ANTIBODY, IGG: F-Actin IgG: 15 Units (ref 0–19)

## 2020-12-18 LAB — MITOCHONDRIAL ANTIBODIES: Mitochondrial M2 Ab, IgG: <20 Units (ref 0.0–20.0)

## 2020-12-18 LAB — PROTIME-INR
INR: 1.7 — ABNORMAL HIGH (ref 0.8–1.2)
Prothrombin Time: 19 seconds — ABNORMAL HIGH (ref 11.4–15.2)

## 2020-12-18 MED ORDER — HEPARIN (PORCINE) 25000 UT/250ML-% IV SOLN
1800.0000 [IU]/h | INTRAVENOUS | Status: DC
  Filled 2020-12-18: qty 250

## 2020-12-18 MED ORDER — HEPARIN (PORCINE) 25000 UT/250ML-% IV SOLN
1800.0000 [IU]/h | INTRAVENOUS | Status: DC
  Administered 2020-12-18: 1800 [IU]/h via INTRAVENOUS
  Filled 2020-12-18 (×2): qty 250

## 2020-12-18 NOTE — Progress Notes (Signed)
ANTICOAGULATION CONSULT NOTE   Pharmacy Consult for Heparin Indication: pulmonary embolus  No Known Allergies  Patient Measurements: Height: '5\' 5"'$  (165.1 cm) Weight: 90.7 kg (200 lb) IBW/kg (Calculated) : 57 Heparin Dosing Weight: 77.1 kg  Vital Signs: Temp: 97.8 F (36.6 C) (02/02 0504) Temp Source: Oral (02/02 0504) BP: 110/70 (02/02 0504) Pulse Rate: 84 (02/02 0504)  Labs: Recent Labs    12/16/20 0049 12/16/20 1649 12/16/20 1649 12/17/20 0025 12/17/20 0613 12/17/20 1326 12/17/20 2143 12/18/20 0408  HGB 11.8*  --   --  11.3*  --   --   --  11.2*  HCT 32.7*  --   --  32.6*  --   --   --  30.8*  PLT 293  --   --  299  --   --   --  287  APTT  --  38*  --  41*  --   --   --   --   LABPROT  --   --   --   --   --   --   --  19.0*  INR  --   --   --   --   --   --   --  1.7*  HEPARINUNFRC  --  <0.10*   < > <0.10*  --  <0.10* 0.17* 0.28*  CREATININE 0.78  --   --  0.90  --   --   --  1.04*  CKTOTAL  --   --   --   --  35*  --   --   --    < > = values in this interval not displayed.    Estimated Creatinine Clearance: 59.2 mL/min (A) (by C-G formula based on SCr of 1.04 mg/dL (H)).   Medical History: Past Medical History:  Diagnosis Date  . Anxiety   . Chronic back pain   . Closed fracture of left olecranon process 08/11/2020  . DDD (degenerative disc disease), cervical   . DDD (degenerative disc disease), lumbosacral   . Depression   . GERD (gastroesophageal reflux disease)   . Hiatal hernia   . History of adenomatous polyp of colon    2009  tubular adenoma  . History of esophagitis   . History of idiopathic seizure    1984  x1 after vaginal delivery (per pt negative work-up and no issue since)  . History of left shoulder fracture    01/ 2014  proximal humerus fx  . Hyperlipidemia   . Left ulnar fracture 08/11/2020  . OA (osteoarthritis)    knees and thumbs  . RLS (restless legs syndrome)   . Supraumbilical hernia   . Umbilical hernia   . Wears  dentures    lower    Medications:  Medications Prior to Admission  Medication Sig Dispense Refill Last Dose  . albuterol (PROVENTIL HFA;VENTOLIN HFA) 108 (90 Base) MCG/ACT inhaler Inhale 2 puffs into the lungs every 6 (six) hours as needed for wheezing or shortness of breath.   12/09/2020 at Unknown time  . apixaban (ELIQUIS) 5 MG TABS tablet Take 1 tablet (5 mg total) by mouth 2 (two) times daily. (Patient taking differently: Take 5 mg by mouth every morning.) 180 tablet 2 12/09/2020 at 1000  . atorvastatin (LIPITOR) 40 MG tablet Take 1 tablet by mouth once daily 90 tablet 0 12/09/2020 at Unknown time  . budesonide-formoterol (SYMBICORT) 80-4.5 MCG/ACT inhaler Inhale 2 puffs by mouth twice daily (Patient taking differently: Inhale 2  puffs into the lungs 2 (two) times daily.) 11 g 1 12/09/2020 at Unknown time  . buPROPion (WELLBUTRIN XL) 150 MG 24 hr tablet Take 1 tablet by mouth once daily (Patient taking differently: Take 150 mg by mouth every morning.) 90 tablet 0 12/09/2020 at Unknown time  . busPIRone (BUSPAR) 5 MG tablet Take 1 tablet (5 mg total) by mouth 3 (three) times daily as needed. 90 tablet 1 12/09/2020 at Unknown time  . Cholecalciferol (VITAMIN D) 125 MCG (5000 UT) CAPS Take 1 capsule by mouth daily. 30 capsule 3 12/09/2020 at Unknown time  . cyclobenzaprine (FLEXERIL) 10 MG tablet Take 1 tablet by mouth three times daily as needed for muscle spasm (Patient taking differently: Take 10 mg by mouth at bedtime as needed for muscle spasms.) 60 tablet 0   . diclofenac Sodium (VOLTAREN) 1 % GEL Apply 1 application topically 2 (two) times daily as needed (pain).     . famotidine (PEPCID) 20 MG tablet Take 1 tablet (20 mg total) by mouth at bedtime. 90 tablet 0 12/09/2020 at Unknown time  . FLUoxetine (PROZAC) 40 MG capsule Take 2 capsules (80 mg total) by mouth daily. 180 capsule 0 12/09/2020 at Unknown time  . Multiple Vitamin (MULTIVITAMIN WITH MINERALS) TABS tablet Take 1 tablet by mouth every  morning.   12/09/2020 at Unknown time  . oxyCODONE (OXY IR/ROXICODONE) 5 MG immediate release tablet Take 1-2 tablets (5-10 mg total) by mouth every 4 (four) hours as needed for moderate pain or severe pain ('5mg'$  moderate, '10mg'$  severe). 30 tablet 0   . pantoprazole (PROTONIX) 20 MG tablet Take 1 tablet by mouth once daily (Patient taking differently: Take 20 mg by mouth every morning.) 90 tablet 1 12/09/2020 at Unknown time    Assessment: Patient with history of PE in 2019 on once daily Eliquis PTA. Per Dr. Melvyn Novas 05/05/18: continue half dose for life but see a hematologist to consider discontinuation in the future. CT surgery is unsure if she will need surgery again and would prefer heparin for now. Pharmacy consulted to start heparin infusion for history of PE. Last dose of apixaban was on 1/27.   2/2 AM update:  Heparin level just below goal  Goal of Therapy:  Heparin level 0.3-0.7 units/mL Monitor platelets by anticoagulation protocol: Yes   Plan:  Inc heparin to 2100 units/hr Check heparin level in 6-8 hours and daily while on heparin Continue to monitor H&H and platelets  Narda Bonds, PharmD, BCPS Clinical Pharmacist Phone: [phone]

## 2020-12-18 NOTE — Plan of Care (Signed)
  Problem: Education: Goal: Knowledge of General Education information will improve Description Including pain rating scale, medication(s)/side effects and non-pharmacologic comfort measures Outcome: Progressing   
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
## 2020-12-18 NOTE — Progress Notes (Addendum)
[place] for IV Heparin Indication: pulmonary embolus  No Known Allergies  Patient Measurements: Height: '5\' 5"'$  (165.1 cm) Weight: 90.7 kg (200 lb) IBW/kg (Calculated) : 57 Heparin Dosing Weight: 77.1 kg  Vital Signs: Temp: 97.8 F (36.6 C) (02/02 0504) Temp Source: Oral (02/02 0504) BP: 110/70 (02/02 0504) Pulse Rate: 84 (02/02 0504)  Labs: Recent Labs    12/16/20 0049 12/16/20 1649 12/16/20 1649 12/17/20 0025 12/17/20 0613 12/17/20 1326 12/18/20 0408 12/18/20 1433 12/18/20 1837  HGB 11.8*  --   --  11.3*  --   --  11.2*  --   --   HCT 32.7*  --   --  32.6*  --   --  30.8*  --   --   PLT 293  --   --  299  --   --  287  --   --   APTT  --  38*  --  41*  --   --   --   --   --   LABPROT  --   --   --   --   --   --  19.0*  --   --   INR  --   --   --   --   --   --  1.7*  --   --   HEPARINUNFRC  --  <0.10*   < > <0.10*  --    < > 0.28* 0.65 1.90*  CREATININE 0.78  --   --  0.90  --   --  1.04*  --   --   CKTOTAL  --   --   --   --  35*  --   --   --   --    < > = values in this interval not displayed.    Estimated Creatinine Clearance: 59.2 mL/min (A) (by C-G formula based on SCr of 1.04 mg/dL (H)).   Medical History: Past Medical History:  Diagnosis Date  . Anxiety   . Chronic back pain   . Closed fracture of left olecranon process 08/11/2020  . DDD (degenerative disc disease), cervical   . DDD (degenerative disc disease), lumbosacral   . Depression   . GERD (gastroesophageal reflux disease)   . Hiatal hernia   . History of adenomatous polyp of colon    2009  tubular adenoma  . History of esophagitis   . History of idiopathic seizure    1984  x1 after vaginal delivery (per pt negative work-up and no issue since)  . History of left shoulder fracture    01/ 2014  proximal humerus fx  . Hyperlipidemia   . Left ulnar fracture 08/11/2020  . OA (osteoarthritis)    knees and thumbs  . RLS (restless legs syndrome)   .  Supraumbilical hernia   . Umbilical hernia   . Wears dentures    lower   Assessment: 65 yr old female with history of PE in 2019 on once daily apixaban  PTA (last dose on 12/12/20). Per Dr. Melvyn Novas 05/05/18: continue half dose for life, but see a hematologist to consider discontinuation in the future. CT surgery is unsure if she will need surgery again and would prefer heparin for now.  Pharmacy consulted to start heparin infusion for history of PE.  Heparin level ~7.5 hrs after heparin infusion was increased to 2100 units/hr was 0.65 units/ml, which is within the goal range for this pt. H/H, plt  stable. Per RN, no issues with bleeding observed. However, RN stated that pt's IV infiltrated today and heparin infusion was off for ~2 hrs. Will check heparin level 6 hrs after heparin infusion restarted at 1300 PM.  Goal of Therapy:  Heparin level 0.3-0.7 units/mL Monitor platelets by anticoagulation protocol: Yes   Plan:  Continue heparin infusion at 2100 units/hr Check heparin level ~6 hrs after heparin infusion restarted Monitor daily heparin level, CBC Monitor for bleeding F/U transition back to apixaban when appropriate  Gillermina Hu, PharmD, BCPS, [facility] Clinical Pharmacist 12/18/20, 15:30 PM   ADDENDUM Heparin level ~6 hrs after heparin infusion was restarted at 1300 PM (new IV after old heparin IV infiltrated earlier today) was 1.90 units/ml, which is above the goal range for this pt. RN said current IV working well, no bleeding observed. Will hold hold heparin IV infusion X 1 hr, then restart at 1800 units/hr. Check 6-hr heparin level after reducing infusion rate.

## 2020-12-18 NOTE — Progress Notes (Addendum)
      [address]       ,[place] [address]             [phone]         Subjective: Awake and alert. Sitting up eating breakfast. No new concerns today. Denies pain or shortness of breath.  Remains on RA with good O2 sats.    Objective: Vital signs in last 24 hours: Temp:  [97.8 F (36.6 C)-98.9 F (37.2 C)] 97.8 F (36.6 C) (02/02 0504) Pulse Rate:  [81-84] 84 (02/02 0504) Resp:  [20] 20 (02/02 0504) BP: (86-111)/(42-73) 110/70 (02/02 0504) SpO2:  [92 %-96 %] 96 % (02/02 0504)    Intake/Output from previous day: 02/01 0701 - 02/02 0700 In: 311 [I.V.:272.7; IV Piggyback:38.3] Out: 800 [Urine:50; Chest Tube:750] Intake/Output this shift: No intake/output data recorded.  General appearance: alert, cooperative and no distress Neurologic: intact Heart: regular rate and rhythm Lungs: Breath sounds are clear bilaterally. CXR shows increasing hazy density over right lower lung zone.  Abdomen: soft, non-tender. Wound: the pigtail catheter insertion site is covered with a dry dressing. All connections secure. The pigtail catheter drained ~750 ml thin, clear, yellow fluid fluid over the past 24 hours.   Lab Results: Recent Labs    12/17/20 0025 12/18/20 0408  WBC 13.4* 12.2*  HGB 11.3* 11.2*  HCT 32.6* 30.8*  PLT 299 287   BMET:  Recent Labs    12/17/20 0025 12/18/20 0408  NA 128* 130*  K 3.6 3.5  CL 99 100  CO2 19* 22  GLUCOSE 94 103*  BUN 23 21  CREATININE 0.90 1.04*  CALCIUM 8.1* 8.2*    PT/INR:  Recent Labs    12/18/20 0408  LABPROT 19.0*  INR 1.7*   ABG    Component Value Date/Time   PHART 7.377 08/08/2020 2303   HCO3 23.3 08/08/2020 2303   TCO2 21 (L) 08/10/2020 1118   ACIDBASEDEF 2.0 08/08/2020 2303   O2SAT 96.0 08/08/2020 2303   CBG (last 3)  Recent Labs    12/17/20 2311 12/18/20 0259 12/18/20 0738  GLUCAP 121* 115* 109*   EXAM:   Assessment/Plan:  -Persistent drainage of pleural pigtail catheter. CXR this  AM suggests some re-accumulation of the right effusion. Repeat abdominal U/S on 2/1 showed only trace parasplenic ascites.  Evaluation by the GI service is underway for persistently elevated LFT's. Continue drainage of the right pleural space with the pigtail catheter.     LOS: 8 days    Antony Odea, PA-C [phone] 12/18/2020  Chest tube not secured in place well - becoming dislodged , secured  In place  Still with 700-800 ml clear yellow fluid - considering placement of pleurix cath Friday if drainage not decreasing further , discussed pleurix with patient  So far not inclined to do exploratory right thoracotomy for potential of traumatic diagram  tear    I have seen and examined Lori Cooper and agree with the above assessment  and plan.  [name] MD Beeper [phone] Office [phone] 12/18/2020 10:11 AM
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
## 2020-12-19 ENCOUNTER — Inpatient Hospital Stay (HOSPITAL_COMMUNITY): Payer: PPO

## 2020-12-19 DIAGNOSIS — K2970 Gastritis, unspecified, without bleeding: Secondary | ICD-10-CM | POA: Diagnosis not present

## 2020-12-19 DIAGNOSIS — R945 Abnormal results of liver function studies: Secondary | ICD-10-CM | POA: Diagnosis not present

## 2020-12-19 DIAGNOSIS — J90 Pleural effusion, not elsewhere classified: Secondary | ICD-10-CM | POA: Diagnosis not present

## 2020-12-19 DIAGNOSIS — R933 Abnormal findings on diagnostic imaging of other parts of digestive tract: Secondary | ICD-10-CM

## 2020-12-19 LAB — COMPREHENSIVE METABOLIC PANEL
ALT: 131 U/L — ABNORMAL HIGH (ref 0–44)
AST: 419 U/L — ABNORMAL HIGH (ref 15–41)
Albumin: 1.7 g/dL — ABNORMAL LOW (ref 3.5–5.0)
Alkaline Phosphatase: 177 U/L — ABNORMAL HIGH (ref 38–126)
Anion gap: 12 (ref 5–15)
BUN: 22 mg/dL (ref 8–23)
CO2: 18 mmol/L — ABNORMAL LOW (ref 22–32)
Calcium: 8 mg/dL — ABNORMAL LOW (ref 8.9–10.3)
Chloride: 97 mmol/L — ABNORMAL LOW (ref 98–111)
Creatinine, Ser: 1.4 mg/dL — ABNORMAL HIGH (ref 0.44–1.00)
GFR, Estimated: 41 mL/min — ABNORMAL LOW (ref >60)
Glucose, Bld: 131 mg/dL — ABNORMAL HIGH (ref 70–99)
Potassium: 3.4 mmol/L — ABNORMAL LOW (ref 3.5–5.1)
Sodium: 127 mmol/L — ABNORMAL LOW (ref 135–145)
Total Bilirubin: 2.6 mg/dL — ABNORMAL HIGH (ref 0.3–1.2)
Total Protein: 5.7 g/dL — ABNORMAL LOW (ref 6.5–8.1)

## 2020-12-19 LAB — APTT: aPTT: >200 seconds (ref 24–36)

## 2020-12-19 LAB — CBC
HCT: 34 % — ABNORMAL LOW (ref 36.0–46.0)
Hemoglobin: 12 g/dL (ref 12.0–15.0)
MCH: 26.7 pg (ref 26.0–34.0)
MCHC: 35.3 g/dL (ref 30.0–36.0)
MCV: 75.6 fL — ABNORMAL LOW (ref 80.0–100.0)
Platelets: 300 10*3/uL (ref 150–400)
RBC: 4.5 MIL/uL (ref 3.87–5.11)
RDW: 21.2 % — ABNORMAL HIGH (ref 11.5–15.5)
WBC: 15.7 10*3/uL — ABNORMAL HIGH (ref 4.0–10.5)
nRBC: 0 % (ref 0.0–0.2)

## 2020-12-19 LAB — CBC WITH DIFFERENTIAL/PLATELET
Abs Immature Granulocytes: 0.06 10*3/uL (ref 0.00–0.07)
Basophils Absolute: 0.1 10*3/uL (ref 0.0–0.1)
Basophils Relative: 1 %
Eosinophils Absolute: 0.6 10*3/uL — ABNORMAL HIGH (ref 0.0–0.5)
Eosinophils Relative: 4 %
HCT: 30.9 % — ABNORMAL LOW (ref 36.0–46.0)
Hemoglobin: 11 g/dL — ABNORMAL LOW (ref 12.0–15.0)
Immature Granulocytes: 0 %
Lymphocytes Relative: 20 %
Lymphs Abs: 2.9 10*3/uL (ref 0.7–4.0)
MCH: 26.5 pg (ref 26.0–34.0)
MCHC: 35.6 g/dL (ref 30.0–36.0)
MCV: 74.5 fL — ABNORMAL LOW (ref 80.0–100.0)
Monocytes Absolute: 1.5 10*3/uL — ABNORMAL HIGH (ref 0.1–1.0)
Monocytes Relative: 10 %
Neutro Abs: 9.1 10*3/uL — ABNORMAL HIGH (ref 1.7–7.7)
Neutrophils Relative %: 65 %
Platelets: 284 10*3/uL (ref 150–400)
RBC: 4.15 MIL/uL (ref 3.87–5.11)
RDW: 20.7 % — ABNORMAL HIGH (ref 11.5–15.5)
WBC: 14.2 10*3/uL — ABNORMAL HIGH (ref 4.0–10.5)
nRBC: 0 % (ref 0.0–0.2)

## 2020-12-19 LAB — PROTIME-INR
INR: 2 — ABNORMAL HIGH (ref 0.8–1.2)
Prothrombin Time: 22.1 seconds — ABNORMAL HIGH (ref 11.4–15.2)

## 2020-12-19 LAB — HEPARIN LEVEL (UNFRACTIONATED)
Heparin Unfractionated: 2 IU/mL — ABNORMAL HIGH (ref 0.30–0.70)
Heparin Unfractionated: >2.2 IU/mL — ABNORMAL HIGH (ref 0.30–0.70)
Heparin Unfractionated: 1.88 IU/mL — ABNORMAL HIGH (ref 0.30–0.70)
Heparin Unfractionated: 0.99 IU/mL — ABNORMAL HIGH (ref 0.30–0.70)

## 2020-12-19 LAB — GLUCOSE, CAPILLARY
Glucose-Capillary: 113 mg/dL — ABNORMAL HIGH (ref 70–99)
Glucose-Capillary: 116 mg/dL — ABNORMAL HIGH (ref 70–99)
Glucose-Capillary: 101 mg/dL — ABNORMAL HIGH (ref 70–99)
Glucose-Capillary: 102 mg/dL — ABNORMAL HIGH (ref 70–99)
Glucose-Capillary: 120 mg/dL — ABNORMAL HIGH (ref 70–99)

## 2020-12-19 MED ORDER — HEPARIN (PORCINE) 25000 UT/250ML-% IV SOLN
900.0000 [IU]/h | INTRAVENOUS | Status: DC
  Filled 2020-12-19: qty 250

## 2020-12-19 MED ORDER — HEPARIN (PORCINE) 25000 UT/250ML-% IV SOLN
1050.0000 [IU]/h | INTRAVENOUS | Status: DC
  Administered 2020-12-19: 1050 [IU]/h via INTRAVENOUS
  Filled 2020-12-19: qty 250

## 2020-12-19 MED ORDER — HEPARIN (PORCINE) 25000 UT/250ML-% IV SOLN
1300.0000 [IU]/h | INTRAVENOUS | Status: DC
  Administered 2020-12-19: 1300 [IU]/h via INTRAVENOUS
  Filled 2020-12-19: qty 250

## 2020-12-19 MED ORDER — SPIRONOLACTONE 25 MG PO TABS
25.0000 mg | ORAL_TABLET | Freq: Every day | ORAL | Status: DC
  Administered 2020-12-20: 25 mg via ORAL
  Filled 2020-12-19: qty 1

## 2020-12-19 MED ORDER — CEFAZOLIN SODIUM-DEXTROSE 2-4 GM/100ML-% IV SOLN
2.0000 g | INTRAVENOUS | Status: AC
  Administered 2020-12-20: 2 g via INTRAVENOUS
  Filled 2020-12-19 (×2): qty 100

## 2020-12-19 NOTE — Progress Notes (Addendum)
      [address]       [place] [address]             [phone]         Subjective: Awake and alert. Having some intermittent "crampy" pain along right costal angle Remains on RA with good O2 sats.    Objective: Vital signs in last 24 hours: Temp:  [98.3 F (36.8 C)-98.4 F (36.9 C)] 98.4 F (36.9 C) (02/03 0536) Pulse Rate:  [78-83] 78 (02/03 0536) Resp:  [16-17] 16 (02/03 0536) BP: (97-98)/(45-46) 97/46 (02/03 0536) SpO2:  [95 %-98 %] 95 % (02/03 0536)    Intake/Output from previous day: 02/02 0701 - 02/03 0700 In: -  Out: 275 [Chest Tube:275] Intake/Output this shift: No intake/output data recorded.  General appearance: alert, cooperative and no distress Neurologic: intact Heart: regular rate and rhythm Lungs: Breath sounds are clear bilaterally. CXR showed increasing hazy density over right lower lung zone.  Abdomen: soft, non-tender. Wound: the pigtail catheter insertion site is covered with a dry dressing. All connections secure but the tubing was rotated and completely kinked off above the retainer device. This was re-configured. The pigtail catheter drained ~275 ml thin, clear, yellow fluid fluid over the past 24 hours per nursing documentation.   Lab Results: Recent Labs    12/18/20 0408 12/19/20 0309  WBC 12.2* 14.2*  HGB 11.2* 11.0*  HCT 30.8* 30.9*  PLT 287 284   BMET:  Recent Labs    12/18/20 0408 12/19/20 0309  NA 130* 127*  K 3.5 3.4*  CL 100 97*  CO2 22 18*  GLUCOSE 103* 131*  BUN 21 22  CREATININE 1.04* 1.40*  CALCIUM 8.2* 8.0*    PT/INR:  Recent Labs    12/18/20 0408  LABPROT 19.0*  INR 1.7*   ABG    Component Value Date/Time   PHART 7.377 08/08/2020 2303   HCO3 23.3 08/08/2020 2303   TCO2 21 (L) 08/10/2020 1118   ACIDBASEDEF 2.0 08/08/2020 2303   O2SAT 96.0 08/08/2020 2303   CBG (last 3)  Recent Labs    12/18/20 2254 12/19/20 0316 12/19/20 0725  GLUCAP 121* 113* 116*    EXAM:   Assessment/Plan:  -Tapering drainage from the pleural pigtail catheter but it was kinkied off this morning so this may not be accurate.  CXR yesterday suggested some re-accumulation of the right effusion. GI work up continues, barium esophagram planned for today. Continue drainage of the right pleural space with the pigtail catheter for now. Dr. Servando Snare considering right PleurX catheter tomorrow depending on the volume of drainage she has today.   Will repeat the CXR now to see if the effusion has re-accumulated further.    LOS: 9 days    Antony Odea, [place] [phone] 12/19/2020  Monitor chest tube output - poss pleurix tomorrow depending on output    [name] MD Beeper [phone] Office [phone] 12/19/2020 10:16 AM
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
## 2020-12-19 NOTE — Progress Notes (Signed)
CRITICAL VALUE ALERT  Critical Value:  PTT >200  Date & Time Notied:  12/19/2020 19:49  Provider Notified: Marlowe Sax  Orders Received/Actions taken:

## 2020-12-19 NOTE — Progress Notes (Signed)
ANTICOAGULATION CONSULT NOTE   Pharmacy Consult for Heparin Indication: pulmonary embolus  No Known Allergies  Patient Measurements: Height: '5\' 5"'$  (165.1 cm) Weight: 90.7 kg (200 lb) IBW/kg (Calculated) : 57 Heparin Dosing Weight: 77.1 kg  Vital Signs: Temp: 98.4 F (36.9 C) (02/03 0536) Temp Source: Oral (02/03 0536) BP: 97/46 (02/03 0536) Pulse Rate: 78 (02/03 0536)  Labs: Recent Labs    12/16/20 1649 12/16/20 1649 12/17/20 0025 12/17/20 [id number] 12/17/20 1326 12/18/20 0408 12/18/20 1433 12/18/20 1837 12/19/20 0309 12/19/20 0535  HGB  --    < > 11.3*  --   --  11.2*  --   --  11.0*  --   HCT  --   --  32.6*  --   --  30.8*  --   --  30.9*  --   PLT  --   --  299  --   --  287  --   --  284  --   APTT 38*  --  41*  --   --   --   --   --   --   --   LABPROT  --   --   --   --   --  19.0*  --   --   --   --   INR  --   --   --   --   --  1.7*  --   --   --   --   HEPARINUNFRC <0.10*  --  <0.10*  --    < > 0.28*   < > 1.90* 2.00* >2.20*  CREATININE  --   --  0.90  --   --  1.04*  --   --  1.40*  --   CKTOTAL  --   --   --  35*  --   --   --   --   --   --    < > = values in this interval not displayed.    Estimated Creatinine Clearance: 44 mL/min (A) (by C-G formula based on SCr of 1.4 mg/dL (H)).  Assessment: Patient with history of PE in 2019 on once daily Eliquis PTA. Per Dr. Melvyn Novas 05/05/18: continue half dose for life but see a hematologist to consider discontinuation in the future. CT surgery is unsure if she will need surgery again and would prefer heparin for now. Pharmacy consulted to start heparin infusion for history of PE. Last dose of apixaban was on 1/27.  Heparin level this afternoon remains SUPRAtherapeutic after a rate decrease earlier today (HL 1.88 << 2, goal of 0.3-0.7).  Noted IV infiltrated yesterday evening and Heparin was switched to new IV site - levels have been high since. Heparin running in left arm PIV and level was drawn from the patient's  R-AC. Will hold for 1 hour and reduce again. CBC stable - no bleeding noted.   Goal of Therapy:  Heparin level 0.3-0.7 units/mL Monitor platelets by anticoagulation protocol: Yes   Plan:  - Hold Heparin for 1 hour - Restart Heparin at a lower rate of 1050 units/hr starting at 1700 - Will continue to monitor for any signs/symptoms of bleeding and will follow up with heparin level in 6 hours   Thank you for allowing pharmacy to be a part of this patient's care.  Alycia Rossetti, PharmD, BCPS Clinical Pharmacist Clinical phone for 12/19/2020: [phone] 12/19/2020 11:06 AM   **Pharmacist phone directory can now be found on [email or web address] (PW  [email or web address]).  Listed under [place].

## 2020-12-19 NOTE — Progress Notes (Addendum)
Daily Rounding Note  12/19/2020, 12:26 PM  LOS: 9 days   SUBJECTIVE:   Chief complaint: dysphagia.  Elevated LFTs.     No complaints.  Still w solid dysphagia at region EUS.   275 mL of output recorded from the chest tube but apparently the chest tube may have been kinked so the output might not be entirely reliable  OBJECTIVE:         Vital signs in last 24 hours:    Temp:  [98.3 F (36.8 C)-98.4 F (36.9 C)] 98.4 F (36.9 C) (02/03 0536) Pulse Rate:  [78-83] 78 (02/03 0536) Resp:  [16-17] 16 (02/03 0536) BP: (97-104)/(39-46) 104/39 (02/03 1103) SpO2:  [95 %-98 %] 95 % (02/03 0536) Last BM Date: 12/18/20 Filed Weights   12/09/20 2023  Weight: 90.7 kg   General: pleasant, comfortable.  Looks moderately ill.  Not toxic   Heart: RRR Chest: no labored breathing or cough.  CT still in place w straw colored fluid Abdomen: soft, NT, ND.  Active BS  Extremities: no CCE Neuro/Psych:  Pleasant, calm.  Fluid speech.  No confusion  Intake/Output from previous day: 02/02 0701 - 02/03 0700 In: -  Out: 275 [Chest Tube:275]  Intake/Output this shift: No intake/output data recorded.  Lab Results: Recent Labs    12/17/20 0025 12/18/20 0408 12/19/20 0309  WBC 13.4* 12.2* 14.2*  HGB 11.3* 11.2* 11.0*  HCT 32.6* 30.8* 30.9*  PLT 299 287 284   BMET Recent Labs    12/17/20 0025 12/18/20 0408 12/19/20 0309  NA 128* 130* 127*  K 3.6 3.5 3.4*  CL 99 100 97*  CO2 19* 22 18*  GLUCOSE 94 103* 131*  BUN '23 21 22  '$ CREATININE 0.90 1.04* 1.40*  CALCIUM 8.1* 8.2* 8.0*   LFT Recent Labs    12/17/20 0025 12/17/20 0613 12/18/20 0408 12/19/20 0309  PROT 5.8*  --  5.8* 5.7*  ALBUMIN 1.7*  --  1.8* 1.7*  AST 396*  --  412* 419*  ALT 137*  --  130* 131*  ALKPHOS 158*  --  164* 177*  BILITOT 2.4*  --  2.4* 2.6*  BILIDIR  --  0.8*  --   --    PT/INR Recent Labs    12/18/20 0408  LABPROT 19.0*  INR 1.7*    Hepatitis Panel No results for input(s): HEPBSAG, HCVAB, HEPAIGM, HEPBIGM in the last 72 hours.  Studies/Results: DG CHEST PORT 1 VIEW  Result Date: 12/19/2020 CLINICAL DATA:  Pleural effusion EXAM: PORTABLE CHEST 1 VIEW COMPARISON:  December 19, 2020, December 18, 2020, December 09, 2020 FINDINGS: The cardiomediastinal silhouette is unchanged in contour.Small RIGHT pleural effusion with RIGHT-sided chest tube in place. No significant pneumothorax. Persistent homogeneous opacification of the RIGHT lung base. Moderate hiatal hernia delineated by enteric contrast. No acute osseous abnormality. IMPRESSION: Small RIGHT pleural effusion with RIGHT-sided chest tube in place. No significant pneumothorax. Electronically Signed   By: Valentino Saxon MD   On: 12/19/2020 09:37   DG CHEST PORT 1 VIEW  Result Date: 12/18/2020 CLINICAL DATA:  Follow-up pleural effusion and drain, initial encounter EXAM: PORTABLE CHEST 1 VIEW COMPARISON:  12/16/2020 FINDINGS: Cardiac shadow is stable. Aortic calcifications are again seen. Mild vascular congestion is noted new from the prior study. Right pigtail catheter is noted in place without evidence of pneumothorax. Slight increased density is noted likely related to posteriorly layering effusion. No bony abnormality is seen. IMPRESSION: Likely small  posterior fusion on the right. Mild central vascular congestion is noted. Electronically Signed   By: Inez Catalina M.D.   On: 12/18/2020 03:26   DG ESOPHAGUS W SINGLE CM (SOL OR THIN BA)  Result Date: 12/19/2020 CLINICAL DATA:  Esophageal dysphagia.  Food getting stuck. EXAM: ESOPHOGRAM/BARIUM SWALLOW TECHNIQUE: Single contrast examination was performed using  thin barium. FLUOROSCOPY TIME:  Fluoroscopy Time:  2 minutes and 6 seconds Radiation Exposure Index (if provided by the fluoroscopic device): 22.40 mGy Number of Acquired Spot Images: 0 COMPARISON:  Chest CT 12/09/2020 FINDINGS: Esophageal dysmotility with disruption of the  primary peristaltic wave, tertiary contractions and intermittent esophageal spasm. There is a moderate to large hiatal hernia noted with a strictured narrowing at the GE junction. The 13 mm barium pill would not pass through this area. IMPRESSION: 1. Moderate to large hiatal hernia. 2. 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 The 13 mm barium pill would not pass through this area. Recommend endoscopic evaluation and potential treatment. 3. Esophageal dysmotility and moderate stasis. Electronically Signed   By: Marijo Sanes M.D.   On: 12/19/2020 08:55   IMPRESSION:   *   Right pleural effusion. Tapering drainage to chest tube. However the pigtail catheter was kinked so output may not be accurate. Chest x-ray yesterday and today w small effusion.  *    Dysphagia. Esophagram this morning shows moderate to large hiatal hernia. Stricture at GE junction. Barium tablet did not pass. Esophageal dysmotility and stasis. Thus she has several reasons for her dysphagia.  *    Elevated LFTs. Generally on upward trend. Abdominal ultrasound suggesting early hepatic steatosis vs cirrhosis, minor perisplenic fluid/ascites. Hep B coretotal ispositive.  Hep B surface Ag,hep B core IgM, Hep B totalAb, HCVAb, HepAIgM: all nonreactive. Hep B surface Ab is pndg.   ANA negative, smooth muscle IgG negative, mitochondrialAbs negative.  Immunoglobulins A/E/G/M in process. Day 3 Aldactone 50/day, Lasix 20/day with minor increase of creatinine.  *   AKI, rising creatinine in setting of Aldactone, Lasix.  *    Hypokalemia.  *   Hyponatremia.  *    Chronic Eliquis. PE 2019. Eliquis on hold, heparin drip in place.   PLAN:     *    EGD with esophageal dilation. We'll aim for Saturday, 2/5. Tomorrow's Endo schedule is already fully booked.. Stop IV heparin 5 hours prior to EGD  *    Discuss diuretics with Dr. Rush Landmark but probably need to stop the Aldactone, Lasix  *   Soft diet, currently on D1 diet.      Lori Cooper  12/19/2020, 12:26 PM Phone [phone]

## 2020-12-19 NOTE — Progress Notes (Addendum)
EVENING ROUNDS NOTE :     [address]       [place] [address]             [phone]                   Procedure(s) (LRB): INSERTION PLEURAL DRAINAGE CATHETER (Right)  Total Length of Stay:  LOS: 9 days  BP (!) 109/51 (BP Location: Right Arm)   Pulse 85   Temp 97.7 F (36.5 C)   Resp 19   Ht '5\' 5"'$  (1.651 m)   Wt 90.7 kg   SpO2 95%   BMI 33.28 kg/m   .Intake/Output      02/02 0701 02/03 0700 02/03 0701 02/04 0700   I.V. (mL/kg)     IV Piggyback     Total Intake(mL/kg)     Urine (mL/kg/hr) 0 (0)    Stool 0    Chest Tube 275    Total Output 275    Net -275         Urine Occurrence 1 x    Stool Occurrence 2 x 1 x     . heparin       Lab Results  Component Value Date   WBC 14.2 (H) 12/19/2020   HGB 11.0 (L) 12/19/2020   HCT 30.9 (L) 12/19/2020   PLT 284 12/19/2020   GLUCOSE 131 (H) 12/19/2020   CHOL 221 (H) 05/28/2020   TRIG 103 05/28/2020   HDL 64 05/28/2020   LDLDIRECT 199 (H) 12/04/2013   LDLCALC 139 (H) 05/28/2020   ALT 131 (H) 12/19/2020   AST 419 (H) 12/19/2020   NA 127 (L) 12/19/2020   K 3.4 (L) 12/19/2020   CL 97 (L) 12/19/2020   CREATININE 1.40 (H) 12/19/2020   BUN 22 12/19/2020   CO2 18 (L) 12/19/2020   TSH 3.070 05/28/2020   INR 1.7 (H) 12/18/2020   HGBA1C 5.6 12/10/2020   Continued ct output- clear yellow Will consider pleurix placement tomorrow- recheck drainage over night if persists plan for tomorrow  Risks options and expectations dicussed with patient and she is agreeable  [name] MD  Beeper [phone] Office [phone] 12/19/2020 4:51 PM

## 2020-12-19 NOTE — Progress Notes (Signed)
[facility] for IV Heparin Indication: pulmonary embolus   Labs: Recent Labs    12/16/20 1649 12/16/20 1649 12/17/20 0025 12/17/20 [id number] 12/17/20 1326 12/18/20 0408 12/18/20 1433 12/18/20 1837 12/19/20 0309  HGB  --    < > 11.3*  --   --  11.2*  --   --  11.0*  HCT  --   --  32.6*  --   --  30.8*  --   --  30.9*  PLT  --   --  299  --   --  287  --   --  284  APTT 38*  --  41*  --   --   --   --   --   --   LABPROT  --   --   --   --   --  19.0*  --   --   --   INR  --   --   --   --   --  1.7*  --   --   --   HEPARINUNFRC <0.10*  --  <0.10*  --    < > 0.28* 0.65 1.90* 2.00*  CREATININE  --   --  0.90  --   --  1.04*  --   --   --   CKTOTAL  --   --   --  35*  --   --   --   --   --    < > = values in this interval not displayed.     Assessment: 65 yr old female with history of PE in 2019 on once daily apixaban  PTA (last dose on 12/12/20). Per Dr. Melvyn Novas 05/05/18: continue half dose for life, but see a hematologist to consider discontinuation in the future. CT surgery is unsure if she will need surgery again and would prefer heparin for now.  Pharmacy consulted to start heparin infusion for history of PE.  Heparin level this am 2.0 units/ml.  This is 2nd heparin level that has been supratherapeutic since heparin restarted in new IV.    Goal of Therapy:  Heparin level 0.3-0.7 units/mL Monitor platelets by anticoagulation protocol: Yes   Plan:  Hold heparin for 2 hours Resume heparin at 1300 units/hr at 07:30  Check heparin level ~6 hrs after heparin infusion restarted Monitor daily heparin level, CBC Monitor for bleeding  Thanks for allowing pharmacy to be a part of this patient's care.  Excell Seltzer, PharmD Clinical Pharmacist

## 2020-12-19 NOTE — Progress Notes (Signed)
ANTICOAGULATION CONSULT NOTE   Pharmacy Consult for Heparin Indication: pulmonary embolus  No Known Allergies  Patient Measurements: Height: '5\' 5"'$  (165.1 cm) Weight: 90.7 kg (200 lb) IBW/kg (Calculated) : 57 Heparin Dosing Weight: 77.1 kg  Vital Signs: Temp: 97.7 F (36.5 C) (02/03 1406) BP: 109/51 (02/03 1406) Pulse Rate: 85 (02/03 1406)  Labs: Recent Labs    12/17/20 0025 12/17/20 [id number] 12/17/20 1326 12/18/20 0408 12/18/20 1433 12/19/20 0309 12/19/20 0535 12/19/20 1422 12/19/20 1820 12/19/20 1846  HGB 11.3*  --   --  11.2*  --  11.0*  --   --  12.0  --   HCT 32.6*  --   --  30.8*  --  30.9*  --   --  34.0*  --   PLT 299  --   --  287  --  284  --   --  300  --   APTT 41*  --   --   --   --   --   --   --  >200*  --   LABPROT  --   --   --  19.0*  --   --   --   --  22.1*  --   INR  --   --   --  1.7*  --   --   --   --  2.0*  --   HEPARINUNFRC <0.10*  --    < > 0.28*   < > 2.00* >2.20* 1.88*  --  0.99*  CREATININE 0.90  --   --  1.04*  --  1.40*  --   --   --   --   CKTOTAL  --  35*  --   --   --   --   --   --   --   --    < > = values in this interval not displayed.    Estimated Creatinine Clearance: 44 mL/min (A) (by C-G formula based on SCr of 1.4 mg/dL (H)).  Assessment: Patient with history of PE in 2019 on once daily Eliquis PTA. Per Dr. Melvyn Novas 05/05/18: continue half dose for life but see a hematologist to consider discontinuation in the future. CT surgery is unsure if she will need surgery again and would prefer heparin for now. Pharmacy consulted to start heparin infusion for history of PE. Last dose of apixaban was on 1/27.  Heparin level this afternoon remains SUPRAtherapeutic after a rate decrease earlier today (HL 1.88 << 2, goal of 0.3-0.7).  Noted IV infiltrated yesterday evening and Heparin was switched to new IV site - levels have been high since. Heparin running in left arm PIV and level was drawn from the patient's R-AC. Will hold for 1 hour and  reduce again. CBC stable - no bleeding noted.   We got a called from Dr. Marlowe Sax about the supra therapeutic PTT level this PM. Asked lab to add on a HL and it came back at 0.99. In this situation, we will hold heparin x 1 hr then resume at the same dose and check another heparin level in AM.   Goal of Therapy:  Heparin level 0.3-0.7 units/mL Monitor platelets by anticoagulation protocol: Yes   Plan:  - Hold Heparin for 1 hour - Restart Heparin at 1050 units/hr starting - Check hep level in AM  Onnie Boer, PharmD, [facility], AAHIVP, CPP Infectious Disease Pharmacist 12/19/2020 9:06 PM

## 2020-12-20 ENCOUNTER — Inpatient Hospital Stay (HOSPITAL_COMMUNITY): Payer: PPO

## 2020-12-20 ENCOUNTER — Inpatient Hospital Stay (HOSPITAL_COMMUNITY): Payer: PPO | Admitting: Certified Registered"

## 2020-12-20 ENCOUNTER — Encounter (HOSPITAL_COMMUNITY)
Admission: EM | Disposition: A | Discharge status: Home Health | Payer: Self-pay | Source: Home / Self Care | Attending: Internal Medicine

## 2020-12-20 ENCOUNTER — Encounter (HOSPITAL_COMMUNITY): Payer: Self-pay | Admitting: Family Medicine

## 2020-12-20 DIAGNOSIS — Z9689 Presence of other specified functional implants: Secondary | ICD-10-CM | POA: Diagnosis not present

## 2020-12-20 DIAGNOSIS — R933 Abnormal findings on diagnostic imaging of other parts of digestive tract: Secondary | ICD-10-CM | POA: Diagnosis not present

## 2020-12-20 DIAGNOSIS — I700 Atherosclerosis of aorta: Secondary | ICD-10-CM | POA: Diagnosis not present

## 2020-12-20 DIAGNOSIS — J90 Pleural effusion, not elsewhere classified: Secondary | ICD-10-CM | POA: Diagnosis not present

## 2020-12-20 DIAGNOSIS — K2970 Gastritis, unspecified, without bleeding: Secondary | ICD-10-CM | POA: Diagnosis not present

## 2020-12-20 DIAGNOSIS — J9601 Acute respiratory failure with hypoxia: Secondary | ICD-10-CM | POA: Diagnosis not present

## 2020-12-20 DIAGNOSIS — R188 Other ascites: Secondary | ICD-10-CM | POA: Diagnosis not present

## 2020-12-20 HISTORY — PX: CHEST TUBE INSERTION: SHX231

## 2020-12-20 LAB — CBC WITH DIFFERENTIAL/PLATELET
Abs Immature Granulocytes: 0.05 10*3/uL (ref 0.00–0.07)
Basophils Absolute: 0.1 10*3/uL (ref 0.0–0.1)
Basophils Relative: 1 %
Eosinophils Absolute: 0.7 10*3/uL — ABNORMAL HIGH (ref 0.0–0.5)
Eosinophils Relative: 5 %
HCT: 33.4 % — ABNORMAL LOW (ref 36.0–46.0)
Hemoglobin: 12.3 g/dL (ref 12.0–15.0)
Immature Granulocytes: 0 %
Lymphocytes Relative: 18 %
Lymphs Abs: 2.5 10*3/uL (ref 0.7–4.0)
MCH: 27.6 pg (ref 26.0–34.0)
MCHC: 36.8 g/dL — ABNORMAL HIGH (ref 30.0–36.0)
MCV: 74.9 fL — ABNORMAL LOW (ref 80.0–100.0)
Monocytes Absolute: 1.2 10*3/uL — ABNORMAL HIGH (ref 0.1–1.0)
Monocytes Relative: 9 %
Neutro Abs: 9.5 10*3/uL — ABNORMAL HIGH (ref 1.7–7.7)
Neutrophils Relative %: 67 %
Platelets: 249 10*3/uL (ref 150–400)
RBC: 4.46 MIL/uL (ref 3.87–5.11)
RDW: 21.8 % — ABNORMAL HIGH (ref 11.5–15.5)
WBC: 13.9 10*3/uL — ABNORMAL HIGH (ref 4.0–10.5)
nRBC: 0.5 % — ABNORMAL HIGH (ref 0.0–0.2)

## 2020-12-20 LAB — IMMUNOGLOBULINS A/E/G/M, SERUM
IgA: 581 mg/dL — ABNORMAL HIGH (ref 87–352)
IgE (Immunoglobulin E), Serum: 183 IU/mL (ref 6–495)
IgG (Immunoglobin G), Serum: 1871 mg/dL — ABNORMAL HIGH (ref 586–1602)
IgM (Immunoglobulin M), Srm: 224 mg/dL — ABNORMAL HIGH (ref 26–217)

## 2020-12-20 LAB — GLUCOSE, CAPILLARY
Glucose-Capillary: 100 mg/dL — ABNORMAL HIGH (ref 70–99)
Glucose-Capillary: 102 mg/dL — ABNORMAL HIGH (ref 70–99)
Glucose-Capillary: 106 mg/dL — ABNORMAL HIGH (ref 70–99)
Glucose-Capillary: 111 mg/dL — ABNORMAL HIGH (ref 70–99)
Glucose-Capillary: 111 mg/dL — ABNORMAL HIGH (ref 70–99)
Glucose-Capillary: 121 mg/dL — ABNORMAL HIGH (ref 70–99)

## 2020-12-20 LAB — HEPATITIS B SURFACE ANTIBODY, QUANTITATIVE: Hep B S AB Quant (Post): 402.1 m[IU]/mL (ref >9.9)

## 2020-12-20 LAB — COMPREHENSIVE METABOLIC PANEL
ALT: 138 U/L — ABNORMAL HIGH (ref 0–44)
AST: 426 U/L — ABNORMAL HIGH (ref 15–41)
Albumin: 2.4 g/dL — ABNORMAL LOW (ref 3.5–5.0)
Alkaline Phosphatase: 192 U/L — ABNORMAL HIGH (ref 38–126)
Anion gap: 14 (ref 5–15)
BUN: 23 mg/dL (ref 8–23)
CO2: 18 mmol/L — ABNORMAL LOW (ref 22–32)
Calcium: 8.4 mg/dL — ABNORMAL LOW (ref 8.9–10.3)
Chloride: 98 mmol/L (ref 98–111)
Creatinine, Ser: 1.53 mg/dL — ABNORMAL HIGH (ref 0.44–1.00)
GFR, Estimated: 37 mL/min — ABNORMAL LOW (ref >60)
Glucose, Bld: 116 mg/dL — ABNORMAL HIGH (ref 70–99)
Potassium: 3.5 mmol/L (ref 3.5–5.1)
Sodium: 130 mmol/L — ABNORMAL LOW (ref 135–145)
Total Bilirubin: 3.1 mg/dL — ABNORMAL HIGH (ref 0.3–1.2)
Total Protein: 7 g/dL (ref 6.5–8.1)

## 2020-12-20 LAB — HEPARIN LEVEL (UNFRACTIONATED): Heparin Unfractionated: 0.82 IU/mL — ABNORMAL HIGH (ref 0.30–0.70)

## 2020-12-20 SURGERY — INSERTION, PLEURAL DRAINAGE CATHETER
Anesthesia: Monitor Anesthesia Care | Site: Chest | Laterality: Right

## 2020-12-20 MED ORDER — PROPOFOL 1000 MG/100ML IV EMUL
INTRAVENOUS | Status: AC
  Filled 2020-12-20: qty 100

## 2020-12-20 MED ORDER — PHENYLEPHRINE 40 MCG/ML (10ML) SYRINGE FOR IV PUSH (FOR BLOOD PRESSURE SUPPORT)
PREFILLED_SYRINGE | INTRAVENOUS | Status: AC
  Filled 2020-12-20: qty 30

## 2020-12-20 MED ORDER — CHLORHEXIDINE GLUCONATE 0.12 % MT SOLN: 15.0000 mL | Freq: Once | OROMUCOSAL | Status: AC

## 2020-12-20 MED ORDER — FENTANYL CITRATE (PF) 100 MCG/2ML IJ SOLN: 25.0000 ug | INTRAMUSCULAR | Status: DC | PRN

## 2020-12-20 MED ORDER — SODIUM CHLORIDE 0.9 % IV SOLN
INTRAVENOUS | Status: AC
  Filled 2020-12-20: qty 1.2

## 2020-12-20 MED ORDER — ALBUMIN HUMAN 5 % IV SOLN
INTRAVENOUS | Status: DC | PRN
Start: 2020-12-20 — End: 2020-12-20

## 2020-12-20 MED ORDER — PROPOFOL 10 MG/ML IV BOLUS
INTRAVENOUS | Status: DC | PRN
  Administered 2020-12-20: 20 mg via INTRAVENOUS

## 2020-12-20 MED ORDER — OXYCODONE HCL 5 MG/5ML PO SOLN
5.0000 mg | Freq: Once | ORAL | Status: DC | PRN
Start: 2020-12-20 — End: 2020-12-20

## 2020-12-20 MED ORDER — PHENYLEPHRINE 40 MCG/ML (10ML) SYRINGE FOR IV PUSH (FOR BLOOD PRESSURE SUPPORT)
PREFILLED_SYRINGE | INTRAVENOUS | Status: DC | PRN
  Administered 2020-12-20 (×3): 80 ug via INTRAVENOUS
  Administered 2020-12-20: 120 ug via INTRAVENOUS
  Administered 2020-12-20: 80 ug via INTRAVENOUS
  Administered 2020-12-20: 120 ug via INTRAVENOUS

## 2020-12-20 MED ORDER — PROPOFOL 10 MG/ML IV BOLUS
INTRAVENOUS | Status: AC
  Filled 2020-12-20: qty 20

## 2020-12-20 MED ORDER — PROPOFOL 500 MG/50ML IV EMUL
INTRAVENOUS | Status: DC | PRN
  Administered 2020-12-20: 100 ug/kg/min via INTRAVENOUS

## 2020-12-20 MED ORDER — FENTANYL CITRATE (PF) 250 MCG/5ML IJ SOLN
INTRAMUSCULAR | Status: DC | PRN
  Administered 2020-12-20: 25 ug via INTRAVENOUS
  Administered 2020-12-20: 50 ug via INTRAVENOUS

## 2020-12-20 MED ORDER — FENTANYL CITRATE (PF) 250 MCG/5ML IJ SOLN
INTRAMUSCULAR | Status: AC
  Filled 2020-12-20: qty 5

## 2020-12-20 MED ORDER — OXYCODONE HCL 5 MG PO TABS
5.0000 mg | ORAL_TABLET | Freq: Once | ORAL | Status: DC | PRN
Start: 2020-12-20 — End: 2020-12-20

## 2020-12-20 MED ORDER — CHLORHEXIDINE GLUCONATE 0.12 % MT SOLN
OROMUCOSAL | Status: AC
  Administered 2020-12-20: 15 mL via OROMUCOSAL
  Filled 2020-12-20: qty 15

## 2020-12-20 MED ORDER — HEPARIN (PORCINE) 25000 UT/250ML-% IV SOLN
900.0000 [IU]/h | INTRAVENOUS | Status: DC
  Administered 2020-12-20: 900 [IU]/h via INTRAVENOUS
  Filled 2020-12-20 (×2): qty 250

## 2020-12-20 MED ORDER — ORAL CARE MOUTH RINSE: 15.0000 mL | Freq: Once | OROMUCOSAL | Status: AC

## 2020-12-20 MED ORDER — MIDAZOLAM HCL 5 MG/5ML IJ SOLN
INTRAMUSCULAR | Status: DC | PRN
  Administered 2020-12-20: 1 mg via INTRAVENOUS

## 2020-12-20 MED ORDER — LACTATED RINGERS IV SOLN: INTRAVENOUS | Status: DC

## 2020-12-20 MED ORDER — LIDOCAINE HCL 1 % IJ SOLN
INTRAMUSCULAR | Status: DC | PRN
  Administered 2020-12-20: 13 mL via INTRAPLEURAL

## 2020-12-20 MED ORDER — LIDOCAINE 2% (20 MG/ML) 5 ML SYRINGE
INTRAMUSCULAR | Status: DC | PRN
  Administered 2020-12-20: 40 mg via INTRAVENOUS

## 2020-12-20 MED ORDER — MIDAZOLAM HCL 2 MG/2ML IJ SOLN
INTRAMUSCULAR | Status: AC
  Filled 2020-12-20: qty 2

## 2020-12-20 MED ORDER — SODIUM CHLORIDE 0.9 % IV SOLN
INTRAVENOUS | Status: DC | PRN
  Administered 2020-12-20: 13:00:00 30 mL

## 2020-12-20 SURGICAL SUPPLY — 34 items
BLADE CLIPPER SURG (BLADE) ×2 IMPLANT
BRUSH SCRUB EZ PLAIN DRY (MISCELLANEOUS) ×4 IMPLANT
CANISTER SUCT 3000ML PPV (MISCELLANEOUS) ×2 IMPLANT
COVER SURGICAL LIGHT HANDLE (MISCELLANEOUS) ×2 IMPLANT
COVER TRANSDUCER ULTRASND GEL (DISPOSABLE) ×2 IMPLANT
DERMABOND ADVANCED (GAUZE/BANDAGES/DRESSINGS) ×1
DERMABOND ADVANCED .7 DNX12 (GAUZE/BANDAGES/DRESSINGS) ×1 IMPLANT
DRAPE C-ARM 42X72 X-RAY (DRAPES) ×2 IMPLANT
DRAPE LAPAROSCOPIC ABDOMINAL (DRAPES) ×2 IMPLANT
FILTER SMOKE EVAC ULPA (FILTER) ×2 IMPLANT
GLOVE BIO SURGEON STRL SZ 6.5 (GLOVE) ×4 IMPLANT
GLOVE SURG SS PI 6.5 STRL IVOR (GLOVE) ×4 IMPLANT
GOWN STRL REUS W/ TWL LRG LVL3 (GOWN DISPOSABLE) ×2 IMPLANT
GOWN STRL REUS W/TWL LRG LVL3 (GOWN DISPOSABLE) ×4
KIT BASIN OR (CUSTOM PROCEDURE TRAY) ×2 IMPLANT
KIT PLEURX DRAIN CATH 1000ML (MISCELLANEOUS) ×4 IMPLANT
KIT PLEURX DRAIN CATH 15.5FR (DRAIN) ×2 IMPLANT
KIT TURNOVER KIT B (KITS) ×2 IMPLANT
NS IRRIG 1000ML POUR BTL (IV SOLUTION) ×2 IMPLANT
PACK GENERAL/GYN (CUSTOM PROCEDURE TRAY) ×2 IMPLANT
PAD ARMBOARD 7.5X6 YLW CONV (MISCELLANEOUS) ×4 IMPLANT
PENCIL SMOKE EVACUATOR (MISCELLANEOUS) ×2 IMPLANT
SET DRAINAGE LINE (MISCELLANEOUS) IMPLANT
SLEEVE SUCTION 125 (MISCELLANEOUS) ×2 IMPLANT
SUT ETHILON 3 0 FSL (SUTURE) ×2 IMPLANT
SUT MNCRL AB 4-0 PS2 18 (SUTURE) ×2 IMPLANT
SUT VIC AB 3-0 X1 27 (SUTURE) ×2 IMPLANT
SYR CONTROL 10ML LL (SYRINGE) ×2 IMPLANT
TAPE CLOTH SURG 6X10 WHT LF (GAUZE/BANDAGES/DRESSINGS) ×2 IMPLANT
TOWEL GREEN STERILE (TOWEL DISPOSABLE) ×2 IMPLANT
TOWEL GREEN STERILE FF (TOWEL DISPOSABLE) ×2 IMPLANT
TRAP FLUID SMOKE EVACUATOR (MISCELLANEOUS) IMPLANT
VALVE REPLACEMENT CAP (MISCELLANEOUS) IMPLANT
WATER STERILE IRR 1000ML POUR (IV SOLUTION) ×2 IMPLANT

## 2020-12-20 NOTE — Transfer of Care (Signed)
Immediate Anesthesia Transfer of Care Note  Patient: Lori Cooper  Procedure(s) Performed: INSERTION PLEURAL DRAINAGE CATHETER (Right Chest)  Patient Location: PACU  Anesthesia Type:MAC  Level of Consciousness: awake, alert  and oriented  Airway & Oxygen Therapy: Patient Spontanous Breathing and Patient connected to nasal cannula oxygen  Post-op Assessment: Report given to RN, Post -op Vital signs reviewed and stable and Patient moving all extremities  Post vital signs: Reviewed and stable  Last Vitals:  Vitals Value Taken Time  BP 75/29 12/20/20 1351  Temp 36.3 C 12/20/20 1350  Pulse 77 12/20/20 1353  Resp 14 12/20/20 1353  SpO2 99 % 12/20/20 1353  Vitals shown include unvalidated device data.  Last Pain:  Vitals:   12/20/20 1000  TempSrc:   PainSc: 0-No pain         Complications: No complications documented.

## 2020-12-20 NOTE — Progress Notes (Addendum)
[address]       [place] [address]             [phone]      Day of Surgery Procedure(s) (LRB): INSERTION PLEURAL DRAINAGE CATHETER (Right) Subjective: Sleepy this morning, dosing off during our discussion.   Objective: Vital signs in last 24 hours: Temp:  [97.7 F (36.5 C)-98.4 F (36.9 C)] 98.4 F (36.9 C) (02/04 0630) Pulse Rate:  [79-85] 79 (02/04 0630) Cardiac Rhythm: Normal sinus rhythm (02/03 1500) Resp:  [16-19] 16 (02/04 0630) BP: (93-109)/(38-51) 95/38 (02/04 0630) SpO2:  [92 %-95 %] 92 % (02/04 0630)     Intake/Output from previous day: 02/03 0701 - 02/04 0700 In: 290.2 [P.O.:240; I.V.:50.2] Out: 1325 [Chest Tube:1325] Intake/Output this shift: No intake/output data recorded.  General appearance: alert, cooperative and no distress Heart: regular rate and rhythm, S1, S2 normal, no murmur, click, rub or gallop Lungs: clear to auscultation bilaterally Abdomen: soft, non-tender; bowel sounds normal; no masses,  no organomegaly Extremities: extremities normal, atraumatic, no cyanosis or edema Wound: chest tube site is clean and dry  Lab Results: Recent Labs    12/19/20 1820 12/20/20 0323  WBC 15.7* 13.9*  HGB 12.0 12.3  HCT 34.0* 33.4*  PLT 300 249   BMET:  Recent Labs    12/18/20 0408 12/19/20 0309  NA 130* 127*  K 3.5 3.4*  CL 100 97*  CO2 22 18*  GLUCOSE 103* 131*  BUN 21 22  CREATININE 1.04* 1.40*  CALCIUM 8.2* 8.0*    PT/INR:  Recent Labs    12/19/20 1820  LABPROT 22.1*  INR 2.0*   ABG    Component Value Date/Time   PHART 7.377 08/08/2020 2303   HCO3 23.3 08/08/2020 2303   TCO2 21 (L) 08/10/2020 1118   ACIDBASEDEF 2.0 08/08/2020 2303   O2SAT 96.0 08/08/2020 2303   CBG (last 3)  Recent Labs    12/20/20 0007 12/20/20 0414 12/20/20 0747  GLUCAP 121* 106* 102*    Assessment/Plan: S/P Procedure(s) (LRB): INSERTION PLEURAL DRAINAGE CATHETER (Right)  1. Plan for OR later today for pleurx  catheter insertion. Lori Cooper understands Lori procedure. She will need home health orders placed to assist with pleurx catheter drainage at home.  2. CXR is stable, right-sided pleural effusion.      LOS: 10 days    Elgie Collard 12/20/2020  Pre Procedure note for inpatients:   [facility] has been scheduled for Procedure(s): INSERTION PLEURAL DRAINAGE CATHETER (Right) today. Lori various methods of treatment have been discussed with Lori Cooper. After consideration of Lori risks, benefits and treatment options Lori Cooper has consented to Lori planned procedure.   Lori Cooper has been seen and labs reviewed. There are no changes in Lori Cooper's condition to prevent proceeding with Lori planned procedure today.  Recent labs:  Lab Results  Component Value Date   WBC 13.9 (H) 12/20/2020   HGB 12.3 12/20/2020   HCT 33.4 (L) 12/20/2020   PLT 249 12/20/2020   GLUCOSE 131 (H) 12/19/2020   CHOL 221 (H) 05/28/2020   TRIG 103 05/28/2020   HDL 64 05/28/2020   LDLDIRECT 199 (H) 12/04/2013   LDLCALC 139 (H) 05/28/2020   ALT 131 (H) 12/19/2020   AST 419 (H) 12/19/2020   NA 127 (L) 12/19/2020   K 3.4 (L) 12/19/2020   CL 97 (L) 12/19/2020   CREATININE 1.40 (H) 12/19/2020   BUN 22 12/19/2020   CO2  18 (L) 12/19/2020   TSH 3.070 05/28/2020   INR 2.0 (H) 12/19/2020   HGBA1C 5.6 12/10/2020   heparin held this am prior to surgery Cooper agreeable with proceeding    [name], MD 12/20/2020 12:03 PM

## 2020-12-20 NOTE — Anesthesia Postprocedure Evaluation (Signed)
Anesthesia Post Note  Patient: Lori Cooper  Procedure(s) Performed: INSERTION PLEURAL DRAINAGE CATHETER (Right Chest)     Patient location during evaluation: PACU Anesthesia Type: MAC Level of consciousness: awake and alert Pain management: pain level controlled Vital Signs Assessment: post-procedure vital signs reviewed and stable Respiratory status: spontaneous breathing, nonlabored ventilation and respiratory function stable Cardiovascular status: stable and blood pressure returned to baseline Anesthetic complications: no   No complications documented.  Last Vitals:  Vitals:   12/20/20 1420 12/20/20 1451  BP: (!) 91/43 (!) 91/41  Pulse: 77   Resp: 14 15  Temp:  36.5 C  SpO2: 97% 99%    Last Pain:  Vitals:   12/20/20 1451  TempSrc: Oral  PainSc:                  [name]

## 2020-12-20 NOTE — Anesthesia Procedure Notes (Signed)
Procedure Name: MAC Date/Time: 12/20/2020 12:24 PM Performed by: Amadeo Garnet, CRNA Pre-anesthesia Checklist: Patient identified, Emergency Drugs available, Suction available and Patient being monitored Patient Re-evaluated:Patient Re-evaluated prior to induction Oxygen Delivery Method: Simple face mask Preoxygenation: Pre-oxygenation with 100% oxygen Induction Type: IV induction Placement Confirmation: positive ETCO2 Dental Injury: Teeth and Oropharynx as per pre-operative assessment
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
## 2020-12-20 NOTE — Progress Notes (Signed)
ANTICOAGULATION CONSULT NOTE   Pharmacy Consult for Heparin Indication: pulmonary embolus  No Known Allergies  Patient Measurements: Height: '5\' 5"'$  (165.1 cm) Weight: 90.7 kg (200 lb) IBW/kg (Calculated) : 57 Heparin Dosing Weight: 77.1 kg  Vital Signs: Temp: 98.2 F (36.8 C) (02/03 2100) Temp Source: Oral (02/03 2100) BP: 93/41 (02/03 2100) Pulse Rate: 79 (02/03 2100)  Labs: Recent Labs    12/17/20 [id number] 12/17/20 1326 12/18/20 0408 12/18/20 1433 12/19/20 0309 12/19/20 0535 12/19/20 1422 12/19/20 1820 12/19/20 1846 12/20/20 0323  HGB  --    < > 11.2*  --  11.0*  --   --  12.0  --   --   HCT  --   --  30.8*  --  30.9*  --   --  34.0*  --   --   PLT  --   --  287  --  284  --   --  300  --   --   APTT  --   --   --   --   --   --   --  >200*  --   --   LABPROT  --   --  19.0*  --   --   --   --  22.1*  --   --   INR  --   --  1.7*  --   --   --   --  2.0*  --   --   HEPARINUNFRC  --    < > 0.28*   < > 2.00*   < > 1.88*  --  0.99* 0.82*  CREATININE  --   --  1.04*  --  1.40*  --   --   --   --   --   CKTOTAL 35*  --   --   --   --   --   --   --   --   --    < > = values in this interval not displayed.    Estimated Creatinine Clearance: 44 mL/min (A) (by C-G formula based on SCr of 1.4 mg/dL (H)).  Assessment: Patient with history of PE in 2019 on once daily Eliquis PTA. Per Dr. Melvyn Novas 05/05/18: continue half dose for life but see a hematologist to consider discontinuation in the future. CT surgery is unsure if she will need surgery again and would prefer heparin for now. Pharmacy consulted to start heparin infusion for history of PE. Last dose of apixaban was on 1/27.  Heparin level remains supratherapeutic (0.82) on gtt at 1050 units/hr. No bleeding noted.  Goal of Therapy:  Heparin level 0.3-0.7 units/mL Monitor platelets by anticoagulation protocol: Yes   Plan:  Decrease heparin to 900 units/hr F/u 6 hr heparin level  Sherlon Handing, PharmD, BCPS Please see  amion for complete clinical pharmacist phone list 12/20/2020 4:25 AM

## 2020-12-20 NOTE — Progress Notes (Addendum)
Daily Rounding Note  12/20/2020, 10:15 AM  LOS: 10 days   SUBJECTIVE:   Chief complaint:   Elevated LFTs.  Possible cirrhosis.  Pleural effusion  Feels well.  No complaints.    OBJECTIVE:         Vital signs in last 24 hours:    Temp:  [97.7 F (36.5 C)-98.4 F (36.9 C)] 98.4 F (36.9 C) (02/04 0630) Pulse Rate:  [79-85] 79 (02/04 0630) Resp:  [16-19] 16 (02/04 0630) BP: (93-109)/(38-51) 95/38 (02/04 0630) SpO2:  [92 %-95 %] 92 % (02/04 0630) Last BM Date: 12/18/20 Filed Weights   12/09/20 2023  Weight: 90.7 kg   General: pleasant, not toxic Heart: RRR Chest: clear bil. R chest tube in place.  No labored breathing. Abdomen: soft, NT, ND.  Active BS  Extremities: No CCE. Neuro/Psych: Oriented x3.  Pleasant, calm, fluid speech.  Intake/Output from previous day: 02/03 0701 - 02/04 0700 In: 290.2 [P.O.:240; I.V.:50.2] Out: 1325 [Chest Tube:1325]  Intake/Output this shift: No intake/output data recorded.  Lab Results: Recent Labs    12/19/20 0309 12/19/20 1820 12/20/20 0323  WBC 14.2* 15.7* 13.9*  HGB 11.0* 12.0 12.3  HCT 30.9* 34.0* 33.4*  PLT 284 300 249   BMET Recent Labs    12/18/20 0408 12/19/20 0309  NA 130* 127*  K 3.5 3.4*  CL 100 97*  CO2 22 18*  GLUCOSE 103* 131*  BUN 21 22  CREATININE 1.04* 1.40*  CALCIUM 8.2* 8.0*   LFT Recent Labs    12/18/20 0408 12/19/20 0309  PROT 5.8* 5.7*  ALBUMIN 1.8* 1.7*  AST 412* 419*  ALT 130* 131*  ALKPHOS 164* 177*  BILITOT 2.4* 2.6*   PT/INR Recent Labs    12/18/20 0408 12/19/20 1820  LABPROT 19.0* 22.1*  INR 1.7* 2.0*   Hepatitis Panel No results for input(s): HEPBSAG, HCVAB, HEPAIGM, HEPBIGM in the last 72 hours.  Studies/Results: DG Chest Port 1 View  Result Date: 12/20/2020 CLINICAL DATA:  Follow-up chest tube EXAM: PORTABLE CHEST 1 VIEW COMPARISON:  12/19/2020 FINDINGS: Cardiac shadow is stable. Aortic calcifications are  again seen. Pigtail catheter is noted on the right stable in appearance. No sizable effusion is seen. No pneumothorax is noted. No bony abnormality is seen. IMPRESSION: No acute abnormality noted. Previously seen right-sided effusion is less well visualized. Electronically Signed   By: Inez Catalina M.D.   On: 12/20/2020 03:58   DG CHEST PORT 1 VIEW  Result Date: 12/19/2020 CLINICAL DATA:  Pleural effusion EXAM: PORTABLE CHEST 1 VIEW COMPARISON:  December 19, 2020, December 18, 2020, December 09, 2020 FINDINGS: The cardiomediastinal silhouette is unchanged in contour.Small RIGHT pleural effusion with RIGHT-sided chest tube in place. No significant pneumothorax. Persistent homogeneous opacification of the RIGHT lung base. Moderate hiatal hernia delineated by enteric contrast. No acute osseous abnormality. IMPRESSION: Small RIGHT pleural effusion with RIGHT-sided chest tube in place. No significant pneumothorax. Electronically Signed   By: Valentino Saxon MD   On: 12/19/2020 09:37   DG ESOPHAGUS W SINGLE CM (SOL OR THIN BA)  Result Date: 12/19/2020 CLINICAL DATA:  Esophageal dysphagia.  Food getting stuck. EXAM: ESOPHOGRAM/BARIUM SWALLOW TECHNIQUE: Single contrast examination was performed using  thin barium. FLUOROSCOPY TIME:  Fluoroscopy Time:  2 minutes and 6 seconds Radiation Exposure Index (if provided by the fluoroscopic device): 22.40 mGy Number of Acquired Spot Images: 0 COMPARISON:  Chest CT 12/09/2020 FINDINGS: Esophageal dysmotility with disruption of the primary peristaltic  wave, tertiary contractions and intermittent esophageal spasm. There is a moderate to large hiatal hernia noted with a strictured narrowing at the GE junction. The 13 mm barium pill would not pass through this area. IMPRESSION: 1. Moderate to large hiatal hernia. 2. 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 The 13 mm barium pill would not pass through this area. Recommend endoscopic evaluation and potential treatment. 3. Esophageal  dysmotility and moderate stasis. Electronically Signed   By: Marijo Sanes M.D.   On: 12/19/2020 08:55    Scheduled Meds: . famotidine  20 mg Oral QHS  . FLUoxetine  80 mg Oral Daily  . furosemide  20 mg Oral Daily  . insulin aspart  0-9 Units Subcutaneous TID WC  . pantoprazole  40 mg Oral q morning - 10a  . spironolactone  25 mg Oral Daily   Continuous Infusions: .  ceFAZolin (ANCEF) IV     PRN Meds:.acetaminophen **OR** acetaminophen, albuterol, albuterol, alum & mag hydroxide-simeth, oxyCODONE, prochlorperazine   ASSESMENT:   *   Right pleural effusion. Tapering drainage to chest tube. However the pigtail catheter was kinked so output may not be accurate. Chest x-ray yesterday and today w small effusion. Right Pleurx catheter placed this morning.  *    Dysphagia. Esophagram 2/3 shows moderate to large hiatal hernia. Stricture at GE junction. Barium tablet did not pass. Esophageal dysmotility and stasis. Thus she has several reasons for her dysphagia.  *    Elevated LFTs. Generally on upward trend. Abdominal ultrasound suggesting early hepatic steatosis vs cirrhosis,minor perisplenic fluid/ascites. Hep B coretotal ispositive.  Hep B surface Ag,hep B core IgM, Hep B totalAb, HCVAb, HepAIgM: all nonreactive. Hep B surface Ab is 402.   At this point looks like previous Hep B infection, but IgM is negative so unlikely active Hep B.   ANA negative, smooth muscle IgG negative, mitochondrialAbs negative.  Immunoglobulins A/E/G/M in process. Day 4 Aldactone 50/day, Lasix 20/daywith increased BUN/creatinine as of yesterday..  *   AKI, rising creatinine in setting of Aldactone, Lasix.  *    Chronic Eliquis. PE 2019. Eliquis on hold, heparin drip in place.    PLAN   *   C-Met ordered. Hep B quant ordered.   If renal fx is worse, may need to stop diuretics.    *     MRI not possible due to metal in left arm Will confirm need for CT abdomen with IV contrast  recommended, however need BUN/creat before it is ordered.    *     EGD with likely esophageal dilatation planned for tomorrow.  She is currently n.p.o. to allow for Pleurx catheter placement.  After that she can have diet but needs to be n.p.o. again after midnight for the EGD tomorrow.   Alexea Blase  12/20/2020, 10:15 AM Phone [phone]

## 2020-12-20 NOTE — Anesthesia Preprocedure Evaluation (Addendum)
Anesthesia Evaluation  Patient identified by MRN, date of birth, ID band Patient awake    Reviewed: Allergy & Precautions, NPO status , Patient's Chart, lab work & pertinent test results  History of Anesthesia Complications Negative for: history of anesthetic complications  Airway Mallampati: II  TM Distance: >3 FB Neck ROM: Full    Dental  (+) Edentulous Upper, Edentulous Lower   Pulmonary former smoker, PE  Right pleural effusion     + decreased breath sounds  rales    Cardiovascular Normal cardiovascular exam   '22 TTE - EF 60 to 65%. Mild left ventricular hypertrophy.  Grade I diastolic dysfunction (impaired relaxation). Trivial mitral valve regurgitation. Mild aortic valve sclerosis is present, with no evidence of aortic valve stenosis.     Neuro/Psych Seizures - (x1 1984 after vaginal delivery),  PSYCHIATRIC DISORDERS Anxiety Depression  Neuromuscular disease (RLS)    GI/Hepatic hiatal hernia, GERD  Medicated and Controlled,(+)     substance abuse  ,  Elevated LFTs, AST>>ALT    Endo/Other   Obesity Na 127 Ca 8.0 Cl 97   Renal/GU Renal InsufficiencyRenal disease     Musculoskeletal  (+) Arthritis ,   Abdominal   Peds  Hematology  (+) anemia ,  On eliquis    Anesthesia Other Findings Covid test negative   Reproductive/Obstetrics                            Anesthesia Physical Anesthesia Plan  ASA: III  Anesthesia Plan: MAC   Post-op Pain Management:    Induction: Intravenous  PONV Risk Score and Plan: 2 and Propofol infusion and Treatment may vary due to age or medical condition  Airway Management Planned: Natural Airway and Simple Face Mask  Additional Equipment: None  Intra-op Plan:   Post-operative Plan:   Informed Consent: I have reviewed the patients History and Physical, chart, labs and discussed the procedure including the risks, benefits and  alternatives for the proposed anesthesia with the patient or authorized representative who has indicated his/her understanding and acceptance.       Plan Discussed with: CRNA and Anesthesiologist  Anesthesia Plan Comments:        Anesthesia Quick Evaluation

## 2020-12-20 NOTE — Brief Op Note (Signed)
      [address]       [place] [address]             [phone]    12/20/2020  1:37 PM  PATIENT:  Lori Cooper  65 y.o. female  PRE-OPERATIVE DIAGNOSIS:  RIGHT PLEURAL EFFUSION  POST-OPERATIVE DIAGNOSIS:  RIGHT PLEURAL EFFUSION  PROCEDURE:  Procedure(s): INSERTION PLEURAL DRAINAGE CATHETER (Right) with [place] and xray SURGEON:  Surgeon(s) and Role:    [name], MD - Primary   ANESTHESIA:   MAC  EBL:  Minimal, drainage 550 clear yellow pleural fluid   BLOOD ADMINISTERED:none  DRAINS: pleurix   LOCAL MEDICATIONS USED:  LIDOCAINE   SPECIMEN:  No Specimen  DISPOSITION OF SPECIMEN:  N/A  COUNTS:  YES   DICTATION: .Dragon Dictation  PLAN OF CARE: patien is in patient   PATIENT DISPOSITION:  PACU - hemodynamically stable.   Delay start of Pharmacological VTE agent (>24hrs) due to surgical blood loss or risk of bleeding: yes

## 2020-12-20 NOTE — Progress Notes (Signed)
ANTICOAGULATION CONSULT NOTE   Pharmacy Consult for Heparin Indication: pulmonary embolus  No Known Allergies  Patient Measurements: Height: '5\' 5"'$  (165.1 cm) Weight: 90.7 kg (200 lb) IBW/kg (Calculated) : 57 Heparin Dosing Weight: 77.1 kg  Vital Signs: Temp: 97.3 F (36.3 C) (02/04 1350) Temp Source: Oral (02/04 0630) BP: 91/43 (02/04 1420) Pulse Rate: 77 (02/04 1420)  Labs: Recent Labs    12/18/20 0408 12/18/20 1433 12/19/20 0309 12/19/20 0535 12/19/20 1422 12/19/20 1820 12/19/20 1846 12/20/20 0323  HGB 11.2*  --  11.0*  --   --  12.0  --  12.3  HCT 30.8*  --  30.9*  --   --  34.0*  --  33.4*  PLT 287  --  284  --   --  300  --  249  APTT  --   --   --   --   --  >200*  --   --   LABPROT 19.0*  --   --   --   --  22.1*  --   --   INR 1.7*  --   --   --   --  2.0*  --   --   HEPARINUNFRC 0.28*   < > 2.00*   < > 1.88*  --  0.99* 0.82*  CREATININE 1.04*  --  1.40*  --   --   --   --   --    < > = values in this interval not displayed.    Estimated Creatinine Clearance: 44 mL/min (A) (by C-G formula based on SCr of 1.4 mg/dL (H)).  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 Per Dr. Melvyn Novas 05/05/18: continue half dose for life, but see a hematologist to consider discontinuation in the future.  Pharmacy consulted to start heparin infusion for history of PE, pending surgery this admission.  Pt is S/P procedure for insertion of pleural drainage catheter today. Prior to surgery, pt was on heparin infusion at 900 units/hr. Pharmacy is consulted to resume heparin infusion at 2100 PM this evening.  H/H, plt stable. Per PACU RN, no bleeding issues post procedure.  Goal of Therapy:  Heparin level 0.3-0.7 units/mL Monitor platelets by anticoagulation protocol: Yes   Plan:  Resume heparin infusion at 900 units/hr at 2100 this evening Check heparin level 6 hrs after resuming heparin infusion Monitor daily heparin level,  CBC Monitor for bleeding  Gillermina Hu, PharmD, BCPS, [facility] Clinical Pharmacist 12/20/2020 2:24 PM

## 2020-12-21 ENCOUNTER — Encounter (HOSPITAL_COMMUNITY)
Admission: EM | Disposition: A | Discharge status: Home Health | Payer: Self-pay | Source: Home / Self Care | Attending: Internal Medicine

## 2020-12-21 ENCOUNTER — Inpatient Hospital Stay (HOSPITAL_COMMUNITY): Payer: PPO | Admitting: Certified Registered Nurse Anesthetist

## 2020-12-21 ENCOUNTER — Inpatient Hospital Stay (HOSPITAL_COMMUNITY): Payer: PPO

## 2020-12-21 ENCOUNTER — Encounter (HOSPITAL_COMMUNITY): Payer: Self-pay | Admitting: Cardiothoracic Surgery

## 2020-12-21 DIAGNOSIS — R1310 Dysphagia, unspecified: Secondary | ICD-10-CM | POA: Diagnosis not present

## 2020-12-21 DIAGNOSIS — K229 Disease of esophagus, unspecified: Secondary | ICD-10-CM | POA: Diagnosis not present

## 2020-12-21 DIAGNOSIS — R188 Other ascites: Secondary | ICD-10-CM | POA: Diagnosis not present

## 2020-12-21 DIAGNOSIS — K2970 Gastritis, unspecified, without bleeding: Secondary | ICD-10-CM | POA: Diagnosis not present

## 2020-12-21 DIAGNOSIS — I700 Atherosclerosis of aorta: Secondary | ICD-10-CM | POA: Diagnosis not present

## 2020-12-21 DIAGNOSIS — J90 Pleural effusion, not elsewhere classified: Secondary | ICD-10-CM | POA: Diagnosis not present

## 2020-12-21 DIAGNOSIS — J9601 Acute respiratory failure with hypoxia: Secondary | ICD-10-CM | POA: Diagnosis not present

## 2020-12-21 HISTORY — PX: EUS: SHX5427

## 2020-12-21 HISTORY — PX: ESOPHAGOGASTRODUODENOSCOPY (EGD) WITH PROPOFOL: SHX5813

## 2020-12-21 HISTORY — PX: BIOPSY: SHX5522

## 2020-12-21 LAB — CBC WITH DIFFERENTIAL/PLATELET
Abs Immature Granulocytes: 0.05 10*3/uL (ref 0.00–0.07)
Basophils Absolute: 0.1 10*3/uL (ref 0.0–0.1)
Basophils Relative: 1 %
Eosinophils Absolute: 0.4 10*3/uL (ref 0.0–0.5)
Eosinophils Relative: 3 %
HCT: 30.1 % — ABNORMAL LOW (ref 36.0–46.0)
Hemoglobin: 10.6 g/dL — ABNORMAL LOW (ref 12.0–15.0)
Immature Granulocytes: 0 %
Lymphocytes Relative: 17 %
Lymphs Abs: 2.3 10*3/uL (ref 0.7–4.0)
MCH: 26.4 pg (ref 26.0–34.0)
MCHC: 35.2 g/dL (ref 30.0–36.0)
MCV: 74.9 fL — ABNORMAL LOW (ref 80.0–100.0)
Monocytes Absolute: 1.2 10*3/uL — ABNORMAL HIGH (ref 0.1–1.0)
Monocytes Relative: 9 %
Neutro Abs: 9.7 10*3/uL — ABNORMAL HIGH (ref 1.7–7.7)
Neutrophils Relative %: 70 %
Platelets: 246 10*3/uL (ref 150–400)
RBC: 4.02 MIL/uL (ref 3.87–5.11)
RDW: 21.1 % — ABNORMAL HIGH (ref 11.5–15.5)
WBC: 13.7 10*3/uL — ABNORMAL HIGH (ref 4.0–10.5)
nRBC: 0 % (ref 0.0–0.2)

## 2020-12-21 LAB — GLUCOSE, CAPILLARY
Glucose-Capillary: 109 mg/dL — ABNORMAL HIGH (ref 70–99)
Glucose-Capillary: 89 mg/dL (ref 70–99)
Glucose-Capillary: 86 mg/dL (ref 70–99)
Glucose-Capillary: 78 mg/dL (ref 70–99)
Glucose-Capillary: 113 mg/dL — ABNORMAL HIGH (ref 70–99)
Glucose-Capillary: 96 mg/dL (ref 70–99)

## 2020-12-21 LAB — COMPREHENSIVE METABOLIC PANEL
ALT: 95 U/L — ABNORMAL HIGH (ref 0–44)
AST: 326 U/L — ABNORMAL HIGH (ref 15–41)
Albumin: 1.8 g/dL — ABNORMAL LOW (ref 3.5–5.0)
Alkaline Phosphatase: 170 U/L — ABNORMAL HIGH (ref 38–126)
Anion gap: 8 (ref 5–15)
BUN: 25 mg/dL — ABNORMAL HIGH (ref 8–23)
CO2: 19 mmol/L — ABNORMAL LOW (ref 22–32)
Calcium: 8 mg/dL — ABNORMAL LOW (ref 8.9–10.3)
Chloride: 99 mmol/L (ref 98–111)
Creatinine, Ser: 1.73 mg/dL — ABNORMAL HIGH (ref 0.44–1.00)
GFR, Estimated: 32 mL/min — ABNORMAL LOW (ref >60)
Glucose, Bld: 100 mg/dL — ABNORMAL HIGH (ref 70–99)
Potassium: 3.5 mmol/L (ref 3.5–5.1)
Sodium: 126 mmol/L — ABNORMAL LOW (ref 135–145)
Total Bilirubin: 3.1 mg/dL — ABNORMAL HIGH (ref 0.3–1.2)
Total Protein: 5.5 g/dL — ABNORMAL LOW (ref 6.5–8.1)

## 2020-12-21 LAB — HEPATITIS B DNA, ULTRAQUANTITATIVE, PCR
HBV DNA SERPL PCR-ACNC: NOT DETECTED IU/mL
HBV DNA SERPL PCR-LOG IU: UNDETERMINED log10 IU/mL

## 2020-12-21 LAB — HEPARIN LEVEL (UNFRACTIONATED): Heparin Unfractionated: 0.26 IU/mL — ABNORMAL LOW (ref 0.30–0.70)

## 2020-12-21 SURGERY — ESOPHAGOGASTRODUODENOSCOPY (EGD) WITH PROPOFOL
Anesthesia: Monitor Anesthesia Care

## 2020-12-21 MED ORDER — CLOTRIMAZOLE 10 MG MT TROC
10.0000 mg | Freq: Every day | OROMUCOSAL | Status: DC
  Administered 2020-12-21 – 2021-01-10 (×84): 10 mg via ORAL
  Filled 2020-12-21 (×105): qty 1

## 2020-12-21 MED ORDER — PHENYLEPHRINE HCL-NACL 10-0.9 MG/250ML-% IV SOLN
INTRAVENOUS | Status: DC | PRN
  Administered 2020-12-21: 25 ug/min via INTRAVENOUS

## 2020-12-21 MED ORDER — HEPARIN (PORCINE) 25000 UT/250ML-% IV SOLN
1350.0000 [IU]/h | INTRAVENOUS | Status: DC
  Administered 2020-12-22: 1150 [IU]/h via INTRAVENOUS
  Administered 2020-12-22: 950 [IU]/h via INTRAVENOUS
  Filled 2020-12-21 (×2): qty 250

## 2020-12-21 MED ORDER — ALBUMIN HUMAN 5 % IV SOLN: INTRAVENOUS | Status: DC | PRN

## 2020-12-21 MED ORDER — ALBUMIN HUMAN 25 % IV SOLN
12.5000 g | Freq: Once | INTRAVENOUS | Status: AC
  Administered 2020-12-21: 12.5 g via INTRAVENOUS
  Filled 2020-12-21: qty 50

## 2020-12-21 MED ORDER — LIDOCAINE 2% (20 MG/ML) 5 ML SYRINGE
INTRAMUSCULAR | Status: DC | PRN
  Administered 2020-12-21: 50 mg via INTRAVENOUS

## 2020-12-21 MED ORDER — PROPOFOL 500 MG/50ML IV EMUL
INTRAVENOUS | Status: DC | PRN
  Administered 2020-12-21: 150 ug/kg/min via INTRAVENOUS

## 2020-12-21 MED ORDER — SODIUM CHLORIDE 0.9 % IV SOLN: INTRAVENOUS | Status: DC

## 2020-12-21 MED ORDER — SODIUM CHLORIDE 0.9 % IV BOLUS
500.0000 mL | Freq: Once | INTRAVENOUS | Status: AC
  Administered 2020-12-21: 500 mL via INTRAVENOUS

## 2020-12-21 SURGICAL SUPPLY — 15 items

## 2020-12-21 NOTE — Progress Notes (Signed)
      [address]       [place] [address]             [phone]       Home health pleurx catheter ordered placed yesterday by Dr. Servando Snare. Very small right pneumo on CXR. Will repeat CXR today.  If there are any questions feel free to page [place]. Medical care per attending.   Nicholes Rough, PA-C
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
## 2020-12-21 NOTE — Anesthesia Preprocedure Evaluation (Signed)
Anesthesia Evaluation    Reviewed: Allergy & Precautions, Patient's Chart, lab work & pertinent test results, Unable to perform ROS - Chart review only  History of Anesthesia Complications Negative for: history of anesthetic complications  Airway Mallampati: II  TM Distance: >3 FB Neck ROM: Full    Dental  (+) Edentulous Upper, Edentulous Lower   Pulmonary former smoker, PE  Right pleural effusion     + decreased breath sounds  rales    Cardiovascular + DOE  Normal cardiovascular exam   '22 TTE - EF 60 to 65%. Mild left ventricular hypertrophy.  Grade I diastolic dysfunction (impaired relaxation). Trivial mitral valve regurgitation. Mild aortic valve sclerosis is present, with no evidence of aortic valve stenosis.     Neuro/Psych Seizures - (x1 1984 after vaginal delivery),  PSYCHIATRIC DISORDERS Anxiety Depression  Neuromuscular disease (RLS)    GI/Hepatic hiatal hernia, GERD  Medicated and Controlled,(+)     substance abuse  ,  Elevated LFTs, AST>>ALT    Endo/Other   Obesity Na 127 Ca 8.0 Cl 97   Renal/GU Renal InsufficiencyRenal disease     Musculoskeletal  (+) Arthritis ,   Abdominal   Peds  Hematology  (+) anemia ,  On eliquis    Anesthesia Other Findings Covid test negative   Reproductive/Obstetrics                             Anesthesia Physical  Anesthesia Plan  ASA: III  Anesthesia Plan: MAC   Post-op Pain Management:    Induction: Intravenous  PONV Risk Score and Plan: 2 and Propofol infusion and Treatment may vary due to age or medical condition  Airway Management Planned: Natural Airway and Simple Face Mask  Additional Equipment: None  Intra-op Plan:   Post-operative Plan:   Informed Consent: I have reviewed the patients History and Physical, chart, labs and discussed the procedure including the risks, benefits and alternatives for the proposed  anesthesia with the patient or authorized representative who has indicated his/her understanding and acceptance.       Plan Discussed with: CRNA  Anesthesia Plan Comments:         Anesthesia Quick Evaluation

## 2020-12-21 NOTE — Progress Notes (Signed)
ANTICOAGULATION CONSULT NOTE   Pharmacy Consult for Heparin Indication: pulmonary embolus  No Known Allergies  Patient Measurements: Height: '5\' 5"'$  (165.1 cm) Weight: 90.7 kg (199 lb 15.3 oz) IBW/kg (Calculated) : 57 Heparin Dosing Weight: 77.1 kg  Vital Signs: Temp: 97 F (36.1 C) (02/05 1615) Temp Source: Oral (02/05 1504) BP: 99/44 (02/05 1630) Pulse Rate: 80 (02/05 1630)  Labs: Recent Labs    12/19/20 0309 12/19/20 0535 12/19/20 1820 12/19/20 1846 12/20/20 0323 12/20/20 1606 12/21/20 0609  HGB 11.0*  --  12.0  --  12.3  --  10.6*  HCT 30.9*  --  34.0*  --  33.4*  --  30.1*  PLT 284  --  300  --  249  --  246  APTT  --   --  >200*  --   --   --   --   LABPROT  --   --  22.1*  --   --   --   --   INR  --   --  2.0*  --   --   --   --   HEPARINUNFRC 2.00*   < >  --  0.99* 0.82*  --  0.26*  CREATININE 1.40*  --   --   --   --  1.53* 1.73*   < > = values in this interval not displayed.    Estimated Creatinine Clearance: 35.6 mL/min (A) (by C-G formula based on SCr of 1.73 mg/dL (H)).  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 Per Dr. Melvyn Novas 05/05/18: continue half dose for life, but see a hematologist to consider discontinuation in the future.  Pharmacy consulted to start heparin infusion for history of PE, pending surgery this admission.  Pt is S/P procedure for insertion of pleural drainage catheter 2/4. Heparin held this morning for EGD. Ok per GI to restart heparin in am.   Hemoglobin trended down to 10.6 this morning.  Goal of Therapy:  Heparin level 0.3-0.7 units/mL Monitor platelets by anticoagulation protocol: Yes   Plan:  Resume heparin infusion at 950 units/hr at 0800 tomorrow, no bolus Check heparin level 6 hrs after resuming heparin infusion Monitor daily heparin level, CBC Monitor for bleeding  Erin Hearing PharmD., BCPS Clinical Pharmacist 12/21/2020 6:08 PM

## 2020-12-21 NOTE — Transfer of Care (Signed)
Immediate Anesthesia Transfer of Care Note  Patient: Lori Cooper  Procedure(s) Performed: ESOPHAGOGASTRODUODENOSCOPY (EGD) WITH PROPOFOL (N/A ) BIOPSY (N/A ) UPPER ENDOSCOPIC ULTRASOUND (EUS) LINEAR (N/A )  Patient Location: PACU  Anesthesia Type:MAC  Level of Consciousness: awake and alert , oriented  Airway & Oxygen Therapy: Patient Spontanous Breathing  Post-op Assessment: Report given to RN and Post -op Vital signs reviewed and stable  Post vital signs: Reviewed and stable  Last Vitals:  Vitals Value Taken Time  BP 85/30 12/21/20 1613  Temp    Pulse 82 12/21/20 1615  Resp 13 12/21/20 1615  SpO2 100 % 12/21/20 1615  Vitals shown include unvalidated device data.  Last Pain:  Vitals:   12/21/20 1504  TempSrc: Oral  PainSc: 0-No pain      Patients Stated Pain Goal: 2 (17/79/39 0300)  Complications: No complications documented.

## 2020-12-21 NOTE — Anesthesia Procedure Notes (Signed)
Procedure Name: MAC Date/Time: 12/21/2020 3:24 PM Performed by: Alain Marion, CRNA Pre-anesthesia Checklist: Patient identified, Emergency Drugs available, Suction available and Patient being monitored Patient Re-evaluated:Patient Re-evaluated prior to induction Oxygen Delivery Method: Nasal cannula Placement Confirmation: positive ETCO2

## 2020-12-21 NOTE — Progress Notes (Signed)
EKG completed, MD notified and asked to review.

## 2020-12-21 NOTE — Anesthesia Postprocedure Evaluation (Signed)
Anesthesia Post Note  Patient: Lori Cooper  Procedure(s) Performed: ESOPHAGOGASTRODUODENOSCOPY (EGD) WITH PROPOFOL (N/A ) BIOPSY (N/A ) UPPER ENDOSCOPIC ULTRASOUND (EUS) LINEAR (N/A )     Patient location during evaluation: PACU Anesthesia Type: MAC Level of consciousness: awake and alert Pain management: pain level controlled Vital Signs Assessment: post-procedure vital signs reviewed and stable Respiratory status: spontaneous breathing Cardiovascular status: stable Anesthetic complications: no   No complications documented.  Last Vitals:  Vitals:   12/21/20 1615 12/21/20 1630  BP: (!) 85/30 (!) 99/44  Pulse: 82 80  Resp: 13 14  Temp: (!) 36.1 C   SpO2: 100% 98%    Last Pain:  Vitals:   12/21/20 1615  TempSrc:   PainSc: 0-No pain                 Nolon Nations

## 2020-12-21 NOTE — Interval H&P Note (Signed)
History and Physical Interval Note:  12/21/2020 3:07 PM  Lori Cooper  has presented today for surgery, with the diagnosis of Dysphagia. 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 Large hiatal hernia..  The various methods of treatment have been discussed with the patient and family. After consideration of risks, benefits and other options for treatment, the patient has consented to  Procedure(s): ESOPHAGOGASTRODUODENOSCOPY (EGD) WITH PROPOFOL (N/A) SAVORY DILATION (N/A) UPPER ENDOSCOPIC ULTRASOUND (EUS) LINEAR (N/A) as a surgical intervention.  The patient's history has been reviewed, patient examined, no change in status, stable for surgery.  I have reviewed the patient's chart and labs.  Questions were answered to the patient's satisfaction.     [employer]

## 2020-12-21 NOTE — Progress Notes (Signed)
Patient planned for EGD/EUS today with possible dilation.  Heparin needs to be off for 4-6 hours.  I have placed Anticoagulation Pharmacy consult for this (they are already following) and the RN on the floor has been made aware.  She will be shuffled to end of the day for her procedure (around 100PM)   Justice Britain, MD [facility] Advanced Endoscopy Office # [phone]

## 2020-12-21 NOTE — Op Note (Signed)
[facility] Patient Name: Lori Cooper Procedure Date : 12/21/2020 MRN: [id number] Attending MD: Justice Britain , MD Date of Birth: 1956-02-02 CSN: [id number] Age: 65 Admit Type: Inpatient Procedure:                Upper EUS Indications:              Abnormal liver function test, Rule out                            choledocholithiasis, Dysphagia, Hepatitis rule out                            esophageal varices, Abnormal UGI series Providers:                Justice Britain, MD, Glori Bickers, RN, Ladona Ridgel, Technician Referring MD:             [facility], Lilia Argue. Gerhardt Medicines:                Monitored Anesthesia Care Complications:            No immediate complications. Estimated Blood Loss:     Estimated blood loss was minimal. Procedure:                Pre-Anesthesia Assessment:                           - Prior to the procedure, a History and Physical                            was performed, and patient medications and                            allergies were reviewed. The patient's tolerance of                            previous anesthesia was also reviewed. The risks                            and benefits of the procedure and the sedation                            options and risks were discussed with the patient.                            All questions were answered, and informed consent                            was obtained. Prior Anticoagulants: The patient has                            taken heparin, last dose was day of procedure. ASA  Grade Assessment: III - A patient with severe                            systemic disease. After reviewing the risks and                            benefits, the patient was deemed in satisfactory                            condition to undergo the procedure.                           After obtaining informed consent, the endoscope was                             passed under direct vision. Throughout the                            procedure, the patient's blood pressure, pulse, and                            oxygen saturations were monitored continuously. The                            GIF-H190 ([id number]) Olympus gastroscope was                            introduced through the mouth, and advanced to the                            second part of duodenum. The TJF- Q180V ([id number])                            Olympus duodenoscope was introduced through the                            mouth, and advanced to the second part of duodenum.                            The GF-UCT180 ([id number]) Olympus Linear EUS was                            introduced through the mouth, and advanced to the                            duodenum for ultrasound examination. After                            obtaining informed consent, the endoscope was                            passed under direct vision. Throughout the  procedure, the patient's blood pressure, pulse, and                            oxygen saturations were monitored continuously. The                            upper EUS was accomplished without difficulty. The                            patient tolerated the procedure. Scope In: Scope Out: Findings:      ENDOSCOPIC FINDING: :      Patchy, white plaques were found in the entire esophagus. Biopsies were       taken with a cold forceps for histology.      The Z-line was regular and was found 34 cm from the incisors.      A 5 cm hiatal hernia was present.      Patchy mild inflammation characterized by congestion (edema), erosions,       erythema and friability was found in the gastric body. Biopsies were       taken with a cold forceps for histology and Helicobacter pylori testing.      No gross lesions were noted in the duodenal bulb, in the first portion       of the duodenum, in the second portion of the duodenum and in  the major       papilla (hidden underneath a hood).      ENDOSONOGRAPHIC FINDING: :      There was no sign of significant endosonographic abnormality in the       common bile duct (4.7 mm) and in the common hepatic duct (4.7 mm). No       stones, no biliary sludge and ducts of normal caliber were identified.      A small amount of fluid, visualized as an anechoic feature, was found in       the peritoneal cavity.      A few benign-appearing lymph nodes were visualized in the porta hepatis       region. The largest measured 9 mm by 5 mm in maximal cross-sectional       diameter. Impression:               EGD Impression:                           - Esophageal plaques were found, suspicious for                            candidiasis. Biopsied.                           - Z-line regular, 34 cm from the incisors.                           - 5 cm hiatal hernia.                           - Gastritis. Biopsied.                           -  No gross lesions in the duodenal bulb, in the                            first portion of the duodenum, in the second                            portion of the duodenum and in the major papilla.                           EUS Impression:                           - There was no sign of significant pathology in the                            common bile duct and in the common hepatic duct.                           - Small pocket ascites was found on endosonographic                            examination of the peritoneal cavity but was not                            sampled.                           - A few benign lymph nodes were visualized in the                            porta hepatis region. Recommendation:           - The patient will be observed post-procedure,                            until all discharge criteria are met.                           - Return patient to hospital ward for ongoing care.                           - Advance diet as  tolerated.                           - Observe patient's clinical course.                           - Clotrimazole troches 5-times daily.                           - Ideally would be on Fluconazole for treatment                            (however prolonged QTc noted per report), so will  defer this to primary medical service to consider.                           - Observe patient's clinical course.                           - Await path results.                           - Recommend proceeding with IR consultation for                            Transjugular Liver Biopsy with Portal-Pressure                            gradient evaluation. This will ensure elevated                            Portal Hypertensive pressures such that a TIPS in                            the future could be considered if chest tube output                            remains high (even though large volume ascites is                            not seen). Must continue to keep in mind that if                            the defect (which is large) persists that even a                            TIPS may not be helpful if patient actually ends up                            having portal hypertension.                           - The findings and recommendations were discussed                            with the patient.                           - The findings and recommendations were discussed                            with the patient's family.                           - The findings and recommendations were discussed  with the referring physician. Procedure Code(s):        --- Professional ---                           [phone], Esophagogastroduodenoscopy, flexible,                            transoral; with endoscopic ultrasound examination                            limited to the esophagus, stomach or duodenum, and                            adjacent  structures                           43239, Esophagogastroduodenoscopy, flexible,                            transoral; with biopsy, single or multiple Diagnosis Code(s):        --- Professional ---                           K22.9, Disease of esophagus, unspecified                           K44.9, Diaphragmatic hernia without obstruction or                            gangrene                           K29.70, Gastritis, unspecified, without bleeding                           R18.8, Other ascites                           I89.9, Noninfective disorder of lymphatic vessels                            and lymph nodes, unspecified                           R94.5, Abnormal results of liver function studies                           R13.10, Dysphagia, unspecified                           K75.9, Inflammatory liver disease, unspecified                           R93.3, Abnormal findings on diagnostic imaging of                            other parts of digestive tract CPT copyright 2019 [employer]. All rights reserved. The codes documented  in this report are preliminary and upon coder review may  be revised to meet current compliance requirements. Justice Britain, MD 12/21/2020 10:32:48 PM Number of Addenda: 0

## 2020-12-21 NOTE — H&P (View-Only) (Signed)
Patient planned for EGD/EUS today with possible dilation.  Heparin needs to be off for 4-6 hours.  I have placed Anticoagulation Pharmacy consult for this (they are already following) and the RN on the floor has been made aware.  She will be shuffled to end of the day for her procedure (around 100PM)   Justice Britain, MD [facility] Advanced Endoscopy Office # [phone]

## 2020-12-22 ENCOUNTER — Encounter (HOSPITAL_COMMUNITY): Payer: Self-pay | Admitting: Gastroenterology

## 2020-12-22 ENCOUNTER — Inpatient Hospital Stay (HOSPITAL_COMMUNITY): Payer: PPO

## 2020-12-22 DIAGNOSIS — J9601 Acute respiratory failure with hypoxia: Secondary | ICD-10-CM | POA: Diagnosis not present

## 2020-12-22 DIAGNOSIS — J90 Pleural effusion, not elsewhere classified: Secondary | ICD-10-CM | POA: Diagnosis not present

## 2020-12-22 DIAGNOSIS — I700 Atherosclerosis of aorta: Secondary | ICD-10-CM | POA: Diagnosis not present

## 2020-12-22 DIAGNOSIS — R188 Other ascites: Secondary | ICD-10-CM | POA: Diagnosis not present

## 2020-12-22 LAB — URINALYSIS, ROUTINE W REFLEX MICROSCOPIC
Bacteria, UA: NONE SEEN
Bilirubin Urine: NEGATIVE
Glucose, UA: NEGATIVE mg/dL
Hgb urine dipstick: NEGATIVE
Ketones, ur: NEGATIVE mg/dL
Nitrite: NEGATIVE
Protein, ur: NEGATIVE mg/dL
Specific Gravity, Urine: 1.016 (ref 1.005–1.030)
pH: 5 (ref 5.0–8.0)

## 2020-12-22 LAB — CBC WITH DIFFERENTIAL/PLATELET
Abs Immature Granulocytes: 0.04 10*3/uL (ref 0.00–0.07)
Basophils Absolute: 0.1 10*3/uL (ref 0.0–0.1)
Basophils Relative: 1 %
Eosinophils Absolute: 0.5 10*3/uL (ref 0.0–0.5)
Eosinophils Relative: 4 %
HCT: 30.3 % — ABNORMAL LOW (ref 36.0–46.0)
Hemoglobin: 10.8 g/dL — ABNORMAL LOW (ref 12.0–15.0)
Immature Granulocytes: 0 %
Lymphocytes Relative: 19 %
Lymphs Abs: 2.3 10*3/uL (ref 0.7–4.0)
MCH: 26.6 pg (ref 26.0–34.0)
MCHC: 35.6 g/dL (ref 30.0–36.0)
MCV: 74.6 fL — ABNORMAL LOW (ref 80.0–100.0)
Monocytes Absolute: 1.1 10*3/uL — ABNORMAL HIGH (ref 0.1–1.0)
Monocytes Relative: 9 %
Neutro Abs: 8.1 10*3/uL — ABNORMAL HIGH (ref 1.7–7.7)
Neutrophils Relative %: 67 %
Platelets: 256 10*3/uL (ref 150–400)
RBC: 4.06 MIL/uL (ref 3.87–5.11)
RDW: 21.4 % — ABNORMAL HIGH (ref 11.5–15.5)
WBC: 12.2 10*3/uL — ABNORMAL HIGH (ref 4.0–10.5)
nRBC: 0 % (ref 0.0–0.2)

## 2020-12-22 LAB — HEPARIN LEVEL (UNFRACTIONATED): Heparin Unfractionated: <0.1 IU/mL — ABNORMAL LOW (ref 0.30–0.70)

## 2020-12-22 LAB — COMPREHENSIVE METABOLIC PANEL
ALT: 79 U/L — ABNORMAL HIGH (ref 0–44)
AST: 317 U/L — ABNORMAL HIGH (ref 15–41)
Albumin: 2.1 g/dL — ABNORMAL LOW (ref 3.5–5.0)
Alkaline Phosphatase: 160 U/L — ABNORMAL HIGH (ref 38–126)
Anion gap: 10 (ref 5–15)
BUN: 29 mg/dL — ABNORMAL HIGH (ref 8–23)
CO2: 19 mmol/L — ABNORMAL LOW (ref 22–32)
Calcium: 8.1 mg/dL — ABNORMAL LOW (ref 8.9–10.3)
Chloride: 98 mmol/L (ref 98–111)
Creatinine, Ser: 1.83 mg/dL — ABNORMAL HIGH (ref 0.44–1.00)
GFR, Estimated: 30 mL/min — ABNORMAL LOW (ref >60)
Glucose, Bld: 85 mg/dL (ref 70–99)
Potassium: 3.6 mmol/L (ref 3.5–5.1)
Sodium: 127 mmol/L — ABNORMAL LOW (ref 135–145)
Total Bilirubin: 3.6 mg/dL — ABNORMAL HIGH (ref 0.3–1.2)
Total Protein: 5.8 g/dL — ABNORMAL LOW (ref 6.5–8.1)

## 2020-12-22 LAB — GLUCOSE, CAPILLARY
Glucose-Capillary: 103 mg/dL — ABNORMAL HIGH (ref 70–99)
Glucose-Capillary: 91 mg/dL (ref 70–99)
Glucose-Capillary: 95 mg/dL (ref 70–99)
Glucose-Capillary: 122 mg/dL — ABNORMAL HIGH (ref 70–99)
Glucose-Capillary: 106 mg/dL — ABNORMAL HIGH (ref 70–99)

## 2020-12-22 LAB — SODIUM, URINE, RANDOM: Sodium, Ur: <10 mmol/L

## 2020-12-22 LAB — OSMOLALITY, URINE: Osmolality, Ur: 415 mOsm/kg (ref 300–900)

## 2020-12-22 MED ORDER — ALBUMIN HUMAN 25 % IV SOLN
25.0000 g | Freq: Once | INTRAVENOUS | Status: AC
  Administered 2020-12-22 (×2): 12.5 g via INTRAVENOUS
  Filled 2020-12-22: qty 100

## 2020-12-22 MED ORDER — SODIUM CHLORIDE 0.9 % IV BOLUS
500.0000 mL | Freq: Once | INTRAVENOUS | Status: AC
  Administered 2020-12-22: 500 mL via INTRAVENOUS

## 2020-12-22 MED ORDER — IOHEXOL 9 MG/ML PO SOLN: 500.0000 mL | ORAL | Status: AC

## 2020-12-22 NOTE — Progress Notes (Signed)
ANTICOAGULATION CONSULT NOTE   Pharmacy Consult for Heparin Indication: pulmonary embolus  No Known Allergies  Patient Measurements: Height: '5\' 5"'$  (165.1 cm) Weight: 90.7 kg (199 lb 15.3 oz) IBW/kg (Calculated) : 57 Heparin Dosing Weight: 77.1 kg  Vital Signs: Temp: 98 F (36.7 C) (02/06 0604) Temp Source: Oral (02/06 0604) BP: 89/42 (02/06 0604) Pulse Rate: 75 (02/06 0604)  Labs: Recent Labs    12/19/20 1820 12/19/20 1846 12/20/20 0323 12/20/20 1606 12/21/20 0609 12/22/20 0101 12/22/20 1455  HGB 12.0  --  12.3  --  10.6* 10.8*  --   HCT 34.0*  --  33.4*  --  30.1* 30.3*  --   PLT 300  --  249  --  246 256  --   APTT >200*  --   --   --   --   --   --   LABPROT 22.1*  --   --   --   --   --   --   INR 2.0*  --   --   --   --   --   --   HEPARINUNFRC  --    < > 0.82*  --  0.26*  --  <0.10*  CREATININE  --   --   --  1.53* 1.73* 1.83*  --    < > = values in this interval not displayed.    Estimated Creatinine Clearance: 33.7 mL/min (A) (by C-G formula based on SCr of 1.83 mg/dL (H)).  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 Per Dr. Melvyn Novas 05/05/18: continue half dose for life, but see a hematologist to consider discontinuation in the future.  Pharmacy consulted to start heparin infusion for history of PE, pending surgery this admission.  Pt is S/P procedure for insertion of pleural drainage catheter 2/4. Heparin held this morning for EGD. Ok per GI to restart heparin this morning 2/6.    Hemoglobin stable overnight at 10.8. Heparin level undetectable this afternoon.   Goal of Therapy:  Heparin level 0.3-0.7 units/mL Monitor platelets by anticoagulation protocol: Yes   Plan:  Increase heparin infusion to 1150 units/hr  Check heparin level 6 hrs  Monitor daily heparin level, CBC Monitor for bleeding  Erin Hearing PharmD., BCPS Clinical Pharmacist 12/22/2020 5:24 PM

## 2020-12-22 NOTE — Consult Note (Addendum)
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 parameters are consistent with Grade I diastolic dysfunction (impaired relaxation).  2. Right ventricular systolic function is normal. The right ventricular size is normal. There is normal pulmonary artery systolic pressure. The estimated right ventricular systolic pressure is 99991111 mmHg.  3. The mitral valve is grossly normal. Trivial mitral valve regurgitation.  4. The aortic valve is tricuspid. Aortic valve regurgitation is not visualized. Mild aortic valve sclerosis is present, with no evidence of aortic valve stenosis.  5. The inferior vena cava is normal in size with greater than 50% respiratory variability, suggesting right atrial pressure  of 3 mmHg. Comparison(s): Prior images unable to be directly viewed, comparison made by report only. Changes from prior study are noted. 03/11/2018: LVEF 60-65%. FINDINGS  Left Ventricle: Left ventricular ejection fraction, by estimation, is 60 to 65%. The left ventricle has normal function. The left ventricle has no regional wall motion abnormalities. The left ventricular internal cavity size  was normal in size. There is  mild left ventricular hypertrophy. Left ventricular diastolic parameters are consistent with Grade I diastolic dysfunction (impaired relaxation). Indeterminate filling pressures. Right Ventricle: The right ventricular size is normal. No increase in right ventricular wall thickness. Right ventricular systolic function is normal. There is normal pulmonary artery systolic pressure. The tricuspid regurgitant velocity is 2.80 m/s, and  with an assumed right atrial pressure of 3 mmHg, the estimated right ventricular systolic pressure is 99991111 mmHg. Left Atrium: Left atrial size was normal in size. Right Atrium: Right atrial size was normal in size. Pericardium: There is no evidence of pericardial effusion. Mitral Valve: The mitral valve is grossly normal. Trivial mitral valve regurgitation. Tricuspid Valve: The tricuspid valve is grossly normal. Tricuspid valve regurgitation is trivial. Aortic Valve: The aortic valve is tricuspid. Aortic valve regurgitation is not visualized. Mild aortic valve sclerosis is present, with no evidence of aortic valve stenosis. Aortic valve mean gradient measures 6.0 mmHg. Aortic valve peak gradient measures 12.4 mmHg. Aortic valve area, by VTI measures 2.28 cm. Pulmonic Valve: The pulmonic valve was normal in structure. Pulmonic valve regurgitation is not visualized. Aorta: The aortic root and ascending aorta are structurally normal, with no evidence of dilitation. Venous: The inferior vena cava is normal in size with greater than 50% respiratory variability, suggesting right atrial pressure of 3 mmHg. IAS/Shunts: No atrial level shunt detected by color flow Doppler.  LEFT VENTRICLE PLAX 2D LVIDd:         4.80 cm  Diastology LVIDs:         3.10 cm  LV e' medial:    7.18 cm/s LV PW:         1.40 cm  LV E/e' medial:  15.7 LV IVS:        1.20 cm  LV e' lateral:   10.80 cm/s LVOT diam:     1.90 cm  LV E/e' lateral: 10.5 LV SV:         85 LV SV Index:   43 LVOT Area:      2.84 cm  RIGHT VENTRICLE             IVC RV Basal diam:  3.10 cm     IVC diam: 1.50 cm RV S prime:     14.10 cm/s TAPSE (M-mode): 2.3 cm LEFT ATRIUM           Index       RIGHT ATRIUM           Index LA diam:      3.70 cm 1.87 cm/m  RA Area:     15.00 cm LA Vol (A2C): 54.6 ml 27.60 ml/m RA Volume:   36.30 ml  18.35 ml/m LA Vol (A4C): 41.3 ml 20.88 ml/m  AORTIC VALVE AV Area (Vmax):    2.35 cm AV Area (Vmean):   2.28 cm AV Area (VTI):     2.28 cm AV Vmax:           176.00 cm/s AV Vmean:          119.000 cm/s AV VTI:  0.375 m AV Peak Grad:      12.4 mmHg AV Mean Grad:      6.0 mmHg LVOT Vmax:         146.00 cm/s LVOT Vmean:        95.500 cm/s LVOT VTI:          0.301 m LVOT/AV VTI ratio: 0.80  AORTA Ao Root diam: 3.10 cm Ao Asc diam:  3.20 cm MITRAL VALVE                TRICUSPID VALVE MV Area (PHT): 3.91 cm     TR Peak grad:   31.4 mmHg MV Decel Time: 194 msec     TR Vmax:        280.00 cm/s MV E velocity: 113.00 cm/s MV A velocity: 109.00 cm/s  SHUNTS MV E/A ratio:  1.04         Systemic VTI:  0.30 m                             Systemic Diam: 1.90 cm Lyman Bishop MD Electronically signed by Lyman Bishop MD Signature Date/Time: 12/13/2020/3:16:40 PM    Final    DG ESOPHAGUS W SINGLE CM (SOL OR THIN BA)  Result Date: 12/19/2020 CLINICAL DATA:  Esophageal dysphagia.  Food getting stuck. EXAM: ESOPHOGRAM/BARIUM SWALLOW TECHNIQUE: Single contrast examination was performed using  thin barium. FLUOROSCOPY TIME:  Fluoroscopy Time:  2 minutes and 6 seconds Radiation Exposure Index (if provided by the fluoroscopic device): 22.40 mGy Number of Acquired Spot Images: 0 COMPARISON:  Chest CT 12/09/2020 FINDINGS: Esophageal dysmotility with disruption of the primary peristaltic wave, tertiary contractions and intermittent esophageal spasm. There is a moderate to large hiatal hernia noted with a strictured narrowing at the GE junction. The 13 mm barium pill would not pass through this area. IMPRESSION:  1. Moderate to large hiatal hernia. 2. 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 The 13 mm barium pill would not pass through this area. Recommend endoscopic evaluation and potential treatment. 3. Esophageal dysmotility and moderate stasis. Electronically Signed   By: Marijo Sanes M.D.   On: 12/19/2020 08:55   IR PERC PLEURAL DRAIN W/INDWELL CATH W/IMG GUIDE  Result Date: 12/17/2020 INDICATION: 65 year old female with a history right-sided pleural effusion.  EXAM: IMAGE GUIDED PLACEMENT OF RIGHT-SIDED PLEURAL DRAIN  MEDICATIONS: The patient is currently admitted to the hospital and receiving intravenous antibiotics. The antibiotics were administered within an appropriate time frame prior to the initiation of the procedure.  ANESTHESIA/SEDATION: Fentanyl 25 mcg IV; Versed 0.5 mg IV  Moderate Sedation Time:  11 minutes  The patient was continuously monitored during the procedure by the interventional radiology nurse under my direct supervision.  COMPLICATIONS: None  PROCEDURE: The procedure, risks, benefits, and alternatives were explained to the patient/patient's family, who provided informed consent on the patient's behalf. Specific risks that were addressed included bleeding, infection, ongoing pneumothorax, need for further procedure/surgery, chance of hemorrhage, hemoptysis, cardiopulmonary collapse, death. Questions regarding the procedure were encouraged and answered. The patient understands and consents to the procedure.  Patient was positioned in the right anterior oblique position on the IR table and scout image of the chest was performed for planning purposes.  The right mid axillary line at the level of the nipple was identified, and prepped and draped in the usual sterile fashion. The skin and subcutaneous tissues were generously infiltrated 1% lidocaine for local anesthesia.  A trocar needle  was then used to enter the pleural space using ultrasound guidance. The inner trocar introducer was  removed and an 035 guidewire was advanced to the apex of the lung under fluoroscopy. Dilation of the skin tract was performed over the wire, and then modified Seldinger technique was used to place a 10 French pigtail catheter into the pleural space.  Catheter was attached to water seal chamber and suction was applied confirming a operational chest tube.  Retention suture was placed.  Sterile dressing was placed.  Patient tolerated the procedure well and remained hemodynamically stable throughout.  No complications were encountered and no significant blood loss was encounter  IMPRESSION: Status post image guided right-sided pleural drainage catheter.  Signed,  Dulcy Fanny. Dellia Nims, RPVI  [facility]  [facility]   Electronically Signed   By: Corrie Mckusick D.O.   On: 12/13/2020 18:04  US THORACENTESIS ASP PLEURAL SPACE W/IMG GUIDE  Result Date: 12/10/2020 INDICATION: RIGHT pleural effusion and shortness of breath EXAM: ULTRASOUND GUIDED DIAGNOSTIC AND THERAPEUTIC RIGHT THORACENTESIS MEDICATIONS: None. COMPLICATIONS: None immediate. PROCEDURE: An ultrasound guided thoracentesis was thoroughly discussed with the patient and questions answered. The benefits, risks, alternatives and complications were also discussed. The patient understands and wishes to proceed with the procedure. Written consent was obtained. Ultrasound was performed to localize and mark an adequate pocket of fluid in the RIGHT chest. The area was then prepped and draped in the normal sterile fashion. 1% Lidocaine was used for local anesthesia. Under ultrasound guidance a 8 French thoracentesis catheter was introduced. Thoracentesis was performed. The catheter was removed and a dressing applied. FINDINGS: A total of approximately 1.7 L of clear yellow RIGHT pleural fluid was removed. Samples were sent to the laboratory as requested by the clinical team. IMPRESSION: Successful ultrasound guided  RIGHT thoracentesis yielding 1.7 L of pleural fluid. Electronically Signed   By: Lavonia Dana M.D.   On: 12/10/2020 15:20    Labs:  CBC: Recent Labs    12/19/20 1820 12/20/20 0323 12/21/20 0609 12/22/20 0101  WBC 15.7* 13.9* 13.7* 12.2*  HGB 12.0 12.3 10.6* 10.8*  HCT 34.0* 33.4* 30.1* 30.3*  PLT 300 249 246 256    COAGS: Recent Labs    08/09/20 0435 12/13/20 0941 12/16/20 1649 12/17/20 0025 12/18/20 0408 12/19/20 1820  INR 1.1 1.6*  --   --  1.7* 2.0*  APTT 24  --  38* 41*  --  >200*    BMP: Recent Labs    08/17/20 1403 08/18/20 0245 08/19/20 0751 08/20/20 0406 12/09/20 2037 12/19/20 0309 12/20/20 1606 12/21/20 0609 12/22/20 0101  NA 134* 134* 134* 136   < > 127* 130* 126* 127*  K 3.2* 3.3* 3.5 3.7   < > 3.4* 3.5 3.5 3.6  CL 97* 97* 96* 98   < > 97* 98 99 98  CO2 '25 25 26 29   '$ < > 18* 18* 19* 19*  GLUCOSE 94 90 97 107*   < > 131* 116* 100* 85  BUN 6* 6* 7* 8   < > 22 23 25* 29*  CALCIUM 8.0* 7.8* 8.0* 8.2*   < > 8.0* 8.4* 8.0* 8.1*  CREATININE 0.64 0.64 0.62 0.59   < > 1.40* 1.53* 1.73* 1.83*  GFRNONAA >60 >60 >60 >60   < > 41* 37* 32* 30*  GFRAA >60 >60 >60 >60  --   --   --   --   --    < > =  values in this interval not displayed.    LIVER FUNCTION TESTS: Recent Labs    12/19/20 0309 12/20/20 1606 12/21/20 0609 12/22/20 0101  BILITOT 2.6* 3.1* 3.1* 3.6*  AST 419* 426* 326* 317*  ALT 131* 138* 95* 79*  ALKPHOS 177* 192* 170* 160*  PROT 5.7* 7.0 5.5* 5.8*  ALBUMIN 1.7* 2.4* 1.8* 2.1*    TUMOR MARKERS: No results for input(s): AFPTM, CEA, CA199, CHROMGRNA in the last 8760 hours.  Assessment and Plan:  Large right-sided pleural effusion in the setting of possible diaphragmatic defect versus portal hypertension and liver disease: Lori Cooper, 65 year old female, is scheduled for a transjugular liver biopsy with portal pressure evaluation 12/23/20. A CT abdomen with contrast was ordered for further evaluation but this was cancelled due to the  patient's rising creatinine levels.   Risks and benefits of this procedure were discussed with the patient and/or patient's family including, but not limited to bleeding, infection, damage to adjacent structures or low yield requiring additional tests.  All of the questions were answered and there is agreement to proceed. Orders for AM labs are already in place and the patient will be NPO after midnight. IV heparin is infusing and this will need to be stopped the morning of the procedure. Someone from IR will call with the timing of this.    Consent signed and in chart.  Thank you for this interesting consult.  I greatly enjoyed meeting Lori Cooper and look forward to participating in their care.  A copy of this report was sent to the requesting provider on this date.  Electronically Signed: Soyla Dryer, AGACNP-BC [phone] 12/22/2020, 9:45 AM   I spent a total of 40 Minutes    in face to face in clinical consultation, greater than 50% of which was counseling/coordinating care for image-guided transjugular liver biopsy.
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
## 2020-12-22 NOTE — Progress Notes (Signed)
Nutrition Brief Note RD working remotely.  RD noted calorie count has been ordered for patient. Updated order to reflect calorie count instructions. Sent secure chat to RN with instructions.  Please hang calorie count envelope on the patient's door. Document percent consumed for each item on the patient's meal tray ticket and keep in envelope. Also document percent of any supplement or snack pt consumes and keep documentation in envelope for RD to review.   Calorie count results will be documented after completion of calorie count.  Jacklynn Barnacle, MS, RD, LDN Pager number available on Amion

## 2020-12-23 ENCOUNTER — Inpatient Hospital Stay (HOSPITAL_COMMUNITY): Payer: PPO

## 2020-12-23 DIAGNOSIS — J90 Pleural effusion, not elsewhere classified: Secondary | ICD-10-CM | POA: Diagnosis not present

## 2020-12-23 DIAGNOSIS — R7989 Other specified abnormal findings of blood chemistry: Secondary | ICD-10-CM | POA: Diagnosis not present

## 2020-12-23 DIAGNOSIS — J9601 Acute respiratory failure with hypoxia: Secondary | ICD-10-CM | POA: Diagnosis not present

## 2020-12-23 DIAGNOSIS — R188 Other ascites: Secondary | ICD-10-CM | POA: Diagnosis not present

## 2020-12-23 DIAGNOSIS — I700 Atherosclerosis of aorta: Secondary | ICD-10-CM | POA: Diagnosis not present

## 2020-12-23 HISTORY — PX: IR TRANSCATHETER BX: IMG713

## 2020-12-23 HISTORY — PX: IR VENOGRAM HEPATIC W HEMODYNAMIC EVALUATION: IMG692

## 2020-12-23 HISTORY — PX: IR US GUIDE VASC ACCESS RIGHT: IMG2390

## 2020-12-23 LAB — PHOSPHORUS: Phosphorus: 2.6 mg/dL (ref 2.5–4.6)

## 2020-12-23 LAB — COMPREHENSIVE METABOLIC PANEL
ALT: 60 U/L — ABNORMAL HIGH (ref 0–44)
AST: 260 U/L — ABNORMAL HIGH (ref 15–41)
Albumin: 2.1 g/dL — ABNORMAL LOW (ref 3.5–5.0)
Alkaline Phosphatase: 157 U/L — ABNORMAL HIGH (ref 38–126)
Anion gap: 11 (ref 5–15)
BUN: 26 mg/dL — ABNORMAL HIGH (ref 8–23)
CO2: 16 mmol/L — ABNORMAL LOW (ref 22–32)
Calcium: 8.1 mg/dL — ABNORMAL LOW (ref 8.9–10.3)
Chloride: 101 mmol/L (ref 98–111)
Creatinine, Ser: 1.83 mg/dL — ABNORMAL HIGH (ref 0.44–1.00)
GFR, Estimated: 30 mL/min — ABNORMAL LOW (ref >60)
Glucose, Bld: 97 mg/dL (ref 70–99)
Potassium: 3.4 mmol/L — ABNORMAL LOW (ref 3.5–5.1)
Sodium: 128 mmol/L — ABNORMAL LOW (ref 135–145)
Total Bilirubin: 3.3 mg/dL — ABNORMAL HIGH (ref 0.3–1.2)
Total Protein: 5.4 g/dL — ABNORMAL LOW (ref 6.5–8.1)

## 2020-12-23 LAB — CBC WITH DIFFERENTIAL/PLATELET
Abs Immature Granulocytes: 0.04 10*3/uL (ref 0.00–0.07)
Basophils Absolute: 0.1 10*3/uL (ref 0.0–0.1)
Basophils Relative: 1 %
Eosinophils Absolute: 0.5 10*3/uL (ref 0.0–0.5)
Eosinophils Relative: 4 %
HCT: 26.5 % — ABNORMAL LOW (ref 36.0–46.0)
Hemoglobin: 9.8 g/dL — ABNORMAL LOW (ref 12.0–15.0)
Immature Granulocytes: 0 %
Lymphocytes Relative: 19 %
Lymphs Abs: 2.5 10*3/uL (ref 0.7–4.0)
MCH: 27.7 pg (ref 26.0–34.0)
MCHC: 37 g/dL — ABNORMAL HIGH (ref 30.0–36.0)
MCV: 74.9 fL — ABNORMAL LOW (ref 80.0–100.0)
Monocytes Absolute: 1.1 10*3/uL — ABNORMAL HIGH (ref 0.1–1.0)
Monocytes Relative: 9 %
Neutro Abs: 8.8 10*3/uL — ABNORMAL HIGH (ref 1.7–7.7)
Neutrophils Relative %: 67 %
Platelets: 216 10*3/uL (ref 150–400)
RBC: 3.54 MIL/uL — ABNORMAL LOW (ref 3.87–5.11)
RDW: 21.7 % — ABNORMAL HIGH (ref 11.5–15.5)
WBC: 13 10*3/uL — ABNORMAL HIGH (ref 4.0–10.5)
nRBC: 0 % (ref 0.0–0.2)

## 2020-12-23 LAB — HEPARIN LEVEL (UNFRACTIONATED)
Heparin Unfractionated: 0.21 IU/mL — ABNORMAL LOW (ref 0.30–0.70)
Heparin Unfractionated: <0.1 IU/mL — ABNORMAL LOW (ref 0.30–0.70)
Heparin Unfractionated: 0.19 IU/mL — ABNORMAL LOW (ref 0.30–0.70)

## 2020-12-23 LAB — GLUCOSE, CAPILLARY
Glucose-Capillary: 110 mg/dL — ABNORMAL HIGH (ref 70–99)
Glucose-Capillary: 137 mg/dL — ABNORMAL HIGH (ref 70–99)
Glucose-Capillary: 87 mg/dL (ref 70–99)
Glucose-Capillary: 90 mg/dL (ref 70–99)
Glucose-Capillary: 91 mg/dL (ref 70–99)
Glucose-Capillary: 91 mg/dL (ref 70–99)

## 2020-12-23 LAB — MAGNESIUM: Magnesium: 1.6 mg/dL — ABNORMAL LOW (ref 1.7–2.4)

## 2020-12-23 MED ORDER — LIDOCAINE HCL 1 % IJ SOLN
INTRAMUSCULAR | Status: AC
  Filled 2020-12-23: qty 20

## 2020-12-23 MED ORDER — POTASSIUM CHLORIDE CRYS ER 20 MEQ PO TBCR
40.0000 meq | EXTENDED_RELEASE_TABLET | Freq: Once | ORAL | Status: AC
  Administered 2020-12-23: 40 meq via ORAL
  Filled 2020-12-23: qty 2

## 2020-12-23 MED ORDER — HEPARIN (PORCINE) 25000 UT/250ML-% IV SOLN
1650.0000 [IU]/h | INTRAVENOUS | Status: AC
  Administered 2020-12-23 – 2020-12-27 (×8): 1500 [IU]/h via INTRAVENOUS
  Administered 2020-12-29 – 2021-01-02 (×6): 1650 [IU]/h via INTRAVENOUS
  Filled 2020-12-23 (×21): qty 250

## 2020-12-23 MED ORDER — MAGNESIUM SULFATE 2 GM/50ML IV SOLN
2.0000 g | Freq: Once | INTRAVENOUS | Status: AC
  Administered 2020-12-23: 2 g via INTRAVENOUS
  Filled 2020-12-23: qty 50

## 2020-12-23 MED ORDER — ADULT MULTIVITAMIN W/MINERALS CH
1.0000 | ORAL_TABLET | Freq: Every day | ORAL | Status: DC
  Administered 2020-12-23 – 2021-01-10 (×15): 1 via ORAL
  Filled 2020-12-23 (×19): qty 1

## 2020-12-23 MED ORDER — SODIUM CHLORIDE 0.9 % IV BOLUS
500.0000 mL | Freq: Once | INTRAVENOUS | Status: AC
  Administered 2020-12-23: 500 mL via INTRAVENOUS

## 2020-12-23 MED ORDER — ENSURE ENLIVE PO LIQD
237.0000 mL | Freq: Three times a day (TID) | ORAL | Status: DC
  Administered 2020-12-23 – 2021-01-10 (×29): 237 mL via ORAL
  Filled 2020-12-23: qty 237

## 2020-12-23 MED ORDER — MIDAZOLAM HCL 2 MG/2ML IJ SOLN
INTRAMUSCULAR | Status: AC | PRN
Start: 2020-12-23 — End: 2020-12-23
  Administered 2020-12-23: 0.5 mg via INTRAVENOUS

## 2020-12-23 MED ORDER — FENTANYL CITRATE (PF) 100 MCG/2ML IJ SOLN
INTRAMUSCULAR | Status: AC
  Filled 2020-12-23: qty 2

## 2020-12-23 MED ORDER — IOHEXOL 300 MG/ML  SOLN: 50.0000 mL | Freq: Once | INTRAMUSCULAR | Status: DC | PRN

## 2020-12-23 MED ORDER — ALBUMIN HUMAN 25 % IV SOLN
25.0000 g | Freq: Once | INTRAVENOUS | Status: AC
  Administered 2020-12-23: 25 g via INTRAVENOUS
  Filled 2020-12-23: qty 100

## 2020-12-23 MED ORDER — FENTANYL CITRATE (PF) 100 MCG/2ML IJ SOLN
INTRAMUSCULAR | Status: AC | PRN
  Administered 2020-12-23: 25 ug via INTRAVENOUS

## 2020-12-23 MED ORDER — MIDAZOLAM HCL 2 MG/2ML IJ SOLN
INTRAMUSCULAR | Status: AC
  Filled 2020-12-23: qty 2
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
## 2020-12-23 NOTE — Progress Notes (Addendum)
      [address]       [place] [address]             [phone]       2 Days Post-Op Procedure(s) (LRB): ESOPHAGOGASTRODUODENOSCOPY (EGD) WITH PROPOFOL (N/A) BIOPSY (N/A) UPPER ENDOSCOPIC ULTRASOUND (EUS) LINEAR (N/A)  Subjective: Patient states her breathing is "pretty good".  Objective: Vital signs in last 24 hours: Temp:  [98.1 F (36.7 C)-98.3 F (36.8 C)] 98.1 F (36.7 C) (02/07 0523) Pulse Rate:  [79-84] 84 (02/07 0523) Resp:  [18] 18 (02/07 0523) BP: (98-99)/(34-43) 98/34 (02/07 0523) SpO2:  [91 %-97 %] 91 % (02/07 0523)     Intake/Output from previous day: 02/06 0701 - 02/07 0700 In: 200 [P.O.:200] Out: 151 [Urine:150; Stool:1]   Physical Exam:  Cardiovascular: RRR Pulmonary: Clear to auscultation on the left and diminished right base Abdomen: Soft, non tender, bowel sounds present. Wounds: Dressing is clean and dry.     Lab Results: [id number] Labs    12/22/20 0101 12/23/20 0047  WBC 12.2* 13.0*  HGB 10.8* 9.8*  HCT 30.3* 26.5*  PLT 256 216   BMET:  Recent Labs    12/22/20 0101 12/23/20 0047  NA 127* 128*  K 3.6 3.4*  CL 98 101  CO2 19* 16*  GLUCOSE 85 97  BUN 29* 26*  CREATININE 1.83* 1.83*  CALCIUM 8.1* 8.1*    PT/INR: No results for input(s): LABPROT, INR in the last 72 hours. ABG:  INR: Will add last result for INR, ABG once components are confirmed Will add last 4 CBG results once components are confirmed  Assessment/Plan:  1. CV - SR with HR in the 70-80's 2.  Pulmonary - S/p right Pleur X catheter 02/04. Pleur X draining instructions provided as well as follow up appointment. As discussed with Dr. Servando Snare, there is no evidence of repairable diaphragmatic hernia;she does, however,  have an abdominal wall hernia but will defer to trauma surgery if that needs surgical intervention. 3. Management per primary  Donielle M ZimmermanPA-C 12/23/2020,7:57 AM [phone]  No indication for exploratory  thoracotomy- daughter in room and reviewed abdominal ct with her and patient . Abdominal wall hernia does not appear to need repair now with other issues currently- but will defer that to GS/Trauma I have seen and examined Glo Herring and agree with the above assessment  and plan.  [name] MD Beeper [phone] Office [phone] 12/23/2020 3:53 PM

## 2020-12-23 NOTE — Procedures (Addendum)
Interventional Radiology Procedure Note  Procedure:    US guided right IJ access Portal venous pressures TJ medical liver biopsy  Complications: None  Recommendations:  - Routine wound care right ij access - Do not submerge for 7 days - may restart AC now, per primary order   Signed,  Dulcy Fanny. Earleen Newport, DO

## 2020-12-23 NOTE — Sedation Documentation (Addendum)
Pressures during case are as follows  Hepatic vein pressure 13/7 (10) Wedge pressure 29, 26 Right heart pressure 9/3 (6)

## 2020-12-23 NOTE — Plan of Care (Signed)
  Problem: Education: Goal: Knowledge of General Education information will improve Description: Including pain rating scale, medication(s)/side effects and non-pharmacologic comfort measures Outcome: Progressing   Problem: Health Behavior/Discharge Planning: Goal: Ability to manage health-related needs will improve Outcome: Progressing   Problem: Clinical Measurements: Goal: Will remain free from infection Outcome: Progressing Goal: Diagnostic test results will improve Outcome: Progressing Goal: Respiratory complications will improve Outcome: Progressing Goal: Cardiovascular complication will be avoided Outcome: Progressing   Problem: Activity: Goal: Risk for activity intolerance will decrease Outcome: Progressing   Problem: Coping: Goal: Level of anxiety will decrease Outcome: Progressing   Problem: Elimination: Goal: Will not experience complications related to bowel motility Outcome: Progressing Goal: Will not experience complications related to urinary retention Outcome: Progressing   Problem: Pain Managment: Goal: General experience of comfort will improve Outcome: Progressing   Problem: Safety: Goal: Ability to remain free from injury will improve Outcome: Progressing   Problem: Skin Integrity: Goal: Risk for impaired skin integrity will decrease Outcome: Progressing
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
## 2020-12-23 NOTE — Progress Notes (Signed)
ANTICOAGULATION CONSULT NOTE  Pharmacy Consult for Heparin Indication: pulmonary embolus  Labs: Recent Labs    12/21/20 0609 12/22/20 0101 12/22/20 1455 12/23/20 0047  HGB 10.6* 10.8*  --  9.8*  HCT 30.1* 30.3*  --  26.5*  PLT 246 256  --  216  HEPARINUNFRC 0.26*  --  <0.10* 0.21*  CREATININE 1.73* 1.83*  --  1.83*    Assessment: Patient with history of PE in 2019 on once-daily apixaban PTA (last dose on 1/27). Per Dr. Melvyn Novas 05/05/18: continue half dose for life, but see a hematologist to consider discontinuation in the future.  Pharmacy consulted to dose heparin infusion for history of PE.  Patient is s/p Pleurx cath placement, chest tube removal and liver biopsy this AM 12/23/20.  Per IR, okay to restart IV heparin now.  Heparin level was sub-therapeutic this AM and rate was increased.  Goal of Therapy:  Heparin level 0.3-0.7 units/mL Monitor platelets by anticoagulation protocol: Yes   Plan:  Resume heparin infusion at 1350 units/hr Check 6-8 hr heparin level Daily heparin level and CBC  Lori Cooper, PharmD, BCPS, [place] 12/23/2020, 12:21 PM

## 2020-12-23 NOTE — Progress Notes (Signed)
ANTICOAGULATION CONSULT NOTE   Pharmacy Consult for Heparin Indication: pulmonary embolus  Labs: Recent Labs    12/21/20 0609 12/22/20 0101 12/22/20 1455 12/23/20 0047  HGB 10.6* 10.8*  --  9.8*  HCT 30.1* 30.3*  --  26.5*  PLT 246 256  --  216  HEPARINUNFRC 0.26*  --  <0.10* 0.21*  CREATININE 1.73* 1.83*  --  1.83*    Assessment: Patient with history of PE in 2019 on once-daily apixaban PTA (last dose on 1/27). Per Dr. Melvyn Novas 05/05/18: continue half dose for life, but see a hematologist to consider discontinuation in the future.  Pharmacy consulted to start heparin infusion for history of PE, pending surgery this admission.  Pt is S/P procedure for insertion of pleural drainage catheter 2/4. Heparin held this morning for EGD. Ok per GI to restart heparin 2/6.    Heparin level 0.21 units/ml. Hg 9.8, PTLC 216    Goal of Therapy:  Heparin level 0.3-0.7 units/mL Monitor platelets by anticoagulation protocol: Yes   Plan:  Increase heparin infusion to 1350 units/hr  Check heparin level 6-8 hrs  Monitor daily heparin level, CBC Monitor for bleeding  Excell Seltzer, PharmD Clinical Pharmacist 12/23/2020 3:03 AM

## 2020-12-23 NOTE — Progress Notes (Signed)
[facility] for Heparin Indication: history of pulmonary embolus  Labs: Recent Labs    12/21/20 0609 12/22/20 0101 12/22/20 1455 12/23/20 0047 12/23/20 1208 12/23/20 1926  HGB 10.6* 10.8*  --  9.8*  --   --   HCT 30.1* 30.3*  --  26.5*  --   --   PLT 246 256  --  216  --   --   HEPARINUNFRC 0.26*  --    < > 0.21* <0.10* 0.19*  CREATININE 1.73* 1.83*  --  1.83*  --   --    < > = values in this interval not displayed.    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 Per Dr. Melvyn Novas 05/05/18: continue half dose for life, but see a hematologist to consider discontinuation in the future.  Pharmacy consulted to dose heparin infusion for history of PE.  Patient is s/p Pleurx cath placement, chest tube removal and liver biopsy this AM 12/23/20.  Heparin was restarted ~ 1430 today.  Heparin level drawn ~ 5hr after restart remains subtherapeutic.    Goal of Therapy:  Heparin level 0.3-0.7 units/mL Monitor platelets by anticoagulation protocol: Yes   Plan:  Increase heparin infusion to 1500 units/hr - caution given procedures from today. Check 6 hr heparin level Daily heparin level and CBC  Kendrew Paci, Pharm.D., BCPS Clinical Pharmacist  12/23/2020 8:33 PM
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
## 2020-12-24 DIAGNOSIS — K766 Portal hypertension: Secondary | ICD-10-CM

## 2020-12-24 DIAGNOSIS — J9601 Acute respiratory failure with hypoxia: Secondary | ICD-10-CM | POA: Diagnosis not present

## 2020-12-24 DIAGNOSIS — J90 Pleural effusion, not elsewhere classified: Secondary | ICD-10-CM | POA: Diagnosis not present

## 2020-12-24 DIAGNOSIS — R188 Other ascites: Secondary | ICD-10-CM | POA: Diagnosis not present

## 2020-12-24 DIAGNOSIS — I700 Atherosclerosis of aorta: Secondary | ICD-10-CM | POA: Diagnosis not present

## 2020-12-24 LAB — COMPREHENSIVE METABOLIC PANEL
ALT: 61 U/L — ABNORMAL HIGH (ref 0–44)
AST: 254 U/L — ABNORMAL HIGH (ref 15–41)
Albumin: 2.3 g/dL — ABNORMAL LOW (ref 3.5–5.0)
Alkaline Phosphatase: 155 U/L — ABNORMAL HIGH (ref 38–126)
Anion gap: 10 (ref 5–15)
BUN: 25 mg/dL — ABNORMAL HIGH (ref 8–23)
CO2: 16 mmol/L — ABNORMAL LOW (ref 22–32)
Calcium: 8.3 mg/dL — ABNORMAL LOW (ref 8.9–10.3)
Chloride: 104 mmol/L (ref 98–111)
Creatinine, Ser: 1.62 mg/dL — ABNORMAL HIGH (ref 0.44–1.00)
GFR, Estimated: 35 mL/min — ABNORMAL LOW (ref >60)
Glucose, Bld: 127 mg/dL — ABNORMAL HIGH (ref 70–99)
Potassium: 3.8 mmol/L (ref 3.5–5.1)
Sodium: 130 mmol/L — ABNORMAL LOW (ref 135–145)
Total Bilirubin: 3.6 mg/dL — ABNORMAL HIGH (ref 0.3–1.2)
Total Protein: 5.7 g/dL — ABNORMAL LOW (ref 6.5–8.1)

## 2020-12-24 LAB — CBC WITH DIFFERENTIAL/PLATELET
Abs Immature Granulocytes: 0.04 10*3/uL (ref 0.00–0.07)
Basophils Absolute: 0.1 10*3/uL (ref 0.0–0.1)
Basophils Relative: 1 %
Eosinophils Absolute: 0.5 10*3/uL (ref 0.0–0.5)
Eosinophils Relative: 4 %
HCT: 28.7 % — ABNORMAL LOW (ref 36.0–46.0)
Hemoglobin: 10.4 g/dL — ABNORMAL LOW (ref 12.0–15.0)
Immature Granulocytes: 0 %
Lymphocytes Relative: 19 %
Lymphs Abs: 2.5 10*3/uL (ref 0.7–4.0)
MCH: 27.4 pg (ref 26.0–34.0)
MCHC: 36.2 g/dL — ABNORMAL HIGH (ref 30.0–36.0)
MCV: 75.7 fL — ABNORMAL LOW (ref 80.0–100.0)
Monocytes Absolute: 1.2 10*3/uL — ABNORMAL HIGH (ref 0.1–1.0)
Monocytes Relative: 9 %
Neutro Abs: 8.8 10*3/uL — ABNORMAL HIGH (ref 1.7–7.7)
Neutrophils Relative %: 67 %
Platelets: 210 10*3/uL (ref 150–400)
RBC: 3.79 MIL/uL — ABNORMAL LOW (ref 3.87–5.11)
RDW: 22.5 % — ABNORMAL HIGH (ref 11.5–15.5)
WBC: 13.1 10*3/uL — ABNORMAL HIGH (ref 4.0–10.5)
nRBC: 0 % (ref 0.0–0.2)

## 2020-12-24 LAB — SURGICAL PATHOLOGY

## 2020-12-24 LAB — MAGNESIUM: Magnesium: 2.3 mg/dL (ref 1.7–2.4)

## 2020-12-24 LAB — GLUCOSE, CAPILLARY
Glucose-Capillary: 116 mg/dL — ABNORMAL HIGH (ref 70–99)
Glucose-Capillary: 121 mg/dL — ABNORMAL HIGH (ref 70–99)
Glucose-Capillary: 132 mg/dL — ABNORMAL HIGH (ref 70–99)
Glucose-Capillary: 139 mg/dL — ABNORMAL HIGH (ref 70–99)
Glucose-Capillary: 139 mg/dL — ABNORMAL HIGH (ref 70–99)
Glucose-Capillary: 145 mg/dL — ABNORMAL HIGH (ref 70–99)
Glucose-Capillary: 197 mg/dL — ABNORMAL HIGH (ref 70–99)

## 2020-12-24 LAB — HEPARIN LEVEL (UNFRACTIONATED)
Heparin Unfractionated: 0.47 IU/mL (ref 0.30–0.70)
Heparin Unfractionated: 0.47 IU/mL (ref 0.30–0.70)

## 2020-12-24 MED ORDER — SODIUM CHLORIDE 0.9 % IV BOLUS
500.0000 mL | Freq: Once | INTRAVENOUS | Status: AC
  Administered 2020-12-25: 500 mL via INTRAVENOUS

## 2020-12-24 MED ORDER — ALBUMIN HUMAN 25 % IV SOLN
25.0000 g | Freq: Once | INTRAVENOUS | Status: AC
  Administered 2020-12-24: 25 g via INTRAVENOUS
  Filled 2020-12-24: qty 100

## 2020-12-24 MED ORDER — SODIUM CHLORIDE 0.9 % IV BOLUS
1000.0000 mL | Freq: Once | INTRAVENOUS | Status: AC
  Administered 2020-12-24: 1000 mL via INTRAVENOUS
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
## 2020-12-24 NOTE — Progress Notes (Signed)
Progress Note   Subjective  Chief Complaint: Elevated LFTs, possible cirrhosis, pleural effusion  Status post liver biopsy 12/23/2020, also showing elevated wedge pressures.  This morning patient has no complaints.  Tells me that she slept well and is eating well and has no abdominal pain.   Objective   Vital signs in last 24 hours: Temp:  [97.6 F (36.4 C)] 97.6 F (36.4 C) (02/07 1530) Pulse Rate:  [80-82] 81 (02/08 0849) Resp:  [17-20] 17 (02/07 1530) BP: (91-105)/(36-56) 92/40 (02/08 0849) SpO2:  [95 %-97 %] 95 % (02/07 1530) Last BM Date: 12/23/20 General:    white female in NAD Heart:  Regular rate and rhythm; no murmurs Lungs: Respirations even and unlabored, lungs CTA bilaterally Abdomen:  Soft, nontender and nondistended. Normal bowel sounds. Psych:  Cooperative. Normal mood and affect.  Intake/Output from previous day: 02/07 0701 - 02/08 0700 In: 337.9 [I.V.:288; IV Piggyback:49.9] Out: 1375 [Urine:375; Chest Tube:1000] Intake/Output this shift: Total I/O In: 240 [P.O.:240] Out: -   Lab Results: Recent Labs    12/22/20 0101 12/23/20 0047 12/24/20 0306  WBC 12.2* 13.0* 13.1*  HGB 10.8* 9.8* 10.4*  HCT 30.3* 26.5* 28.7*  PLT 256 216 210   BMET Recent Labs    12/22/20 0101 12/23/20 0047 12/24/20 0306  NA 127* 128* 130*  K 3.6 3.4* 3.8  CL 98 101 104  CO2 19* 16* 16*  GLUCOSE 85 97 127*  BUN 29* 26* 25*  CREATININE 1.83* 1.83* 1.62*  CALCIUM 8.1* 8.1* 8.3*   LFT Recent Labs    12/24/20 0306  PROT 5.7*  ALBUMIN 2.3*  AST 254*  ALT 61*  ALKPHOS 155*  BILITOT 3.6*   PT/INR No results for input(s): LABPROT, INR in the last 72 hours.  Studies/Results: CT ABDOMEN PELVIS WO CONTRAST  Result Date: 12/22/2020 CLINICAL DATA:  Recurrent EXAM: CT ABDOMEN AND PELVIS WITHOUT CONTRAST TECHNIQUE: Multidetector CT imaging of the abdomen and pelvis was performed following the standard protocol without IV contrast. COMPARISON:  September 30, 2020.  FINDINGS: Evaluation is limited secondary to lack of IV contrast. Lower chest: Small RIGHT pleural effusion with chest tube in place. Trace RIGHT pneumothorax. RIGHT basilar atelectasis. Peripheral centrilobular ground-glass opacities of the RIGHT middle and lower lobe. LEFT basilar atelectasis. There are a few tiny locules of air which are not definitively intrathoracic in location (series 7, image 93; series 6, image 54; series 7, image 84, series 7, image 81; series 6, image 61; series 6, image 65; series 6, image 68). Hepatobiliary: Status post cholecystectomy. Punctate calcification of the liver. Pancreas: No peripancreatic fat stranding. Spleen: Unremarkable. Adrenals/Urinary Tract: Adrenal glands are unremarkable. No hydronephrosis. No nephro LEFT. Bladder is unremarkable. Stomach/Bowel: Large hiatal hernia. There is a lateral nonobstructed bowel containing hernia just inferior to the margin of the RIGHT liver. Status post bowel surgery. No evidence of bowel restriction. Excreted barium contrast within the rectum. Vascular/Lymphatic: Atherosclerotic calcifications of the aorta. Reproductive: Uterus and bilateral adnexa are unremarkable. Other: Small volume ascites. Musculoskeletal: Degenerative changes of the lumbar spine. IMPRESSION: 1. Small RIGHT pleural effusion with chest tube in place. Trace RIGHT pneumothorax. 2. There is a large hiatal hernia. No definitive RIGHT-sided diaphragmatic defect can be identified on this exam. However, there are a few locules of air which may be subdiaphragmatic in location, suggesting a communication between the RIGHT pleural space and the peritoneum. 3. Peripheral centrilobular ground-glass opacities of the RIGHT middle and lower lobe, likely infectious or inflammatory etiology. 4.  Small volume ascites. Aortic Atherosclerosis (ICD10-I70.0). Electronically Signed   By: Valentino Saxon MD   On: 12/22/2020 18:06   IR Venogram Hepatic W Hemodynamic Evaluation  Result  Date: 12/23/2020 INDICATION: 65 year old female with possible cirrhosis, possible portal hypertension EXAM: ULTRASOUND-GUIDED ACCESS RIGHT INTERNAL JUGULAR VEIN HEPATIC VENOGRAM WITH HEMODYNAMIC MEASURING TRANSJUGULAR LIVER BIOPSY MEDICATIONS: None. ANESTHESIA/SEDATION: Moderate (conscious) sedation was employed during this procedure. A total of Versed 0.5 mg and Fentanyl 25 mcg was administered intravenously. Moderate Sedation Time: 49 minutes. The patient's level of consciousness and vital signs were monitored continuously by radiology nursing throughout the procedure under my direct supervision. FLUOROSCOPY TIME:  Fluoroscopy Time: 6 minutes 48 seconds (47 mGy). COMPLICATIONS: None PROCEDURE: Informed written consent was obtained from the patient after a thorough discussion of the procedural risks, benefits and alternatives. All questions were addressed. Maximal Sterile Barrier Technique was utilized including caps, mask, sterile gowns, sterile gloves, sterile drape, hand hygiene and skin antiseptic. A timeout was performed prior to the initiation of the procedure The right neck and chest was prepped with chlorhexidine, and draped in the usual sterile fashion using maximum barrier technique (cap and mask, sterile gown, sterile gloves, large sterile sheet, hand hygiene and cutaneous antiseptic). Local anesthesia was attained by infiltration with 1% lidocaine without epinephrine. Ultrasound demonstrated patency of the right internal jugular vein, and this was documented with an image. Under real-time ultrasound guidance, this vein was accessed with a 21 gauge micropuncture needle and image documentation was performed. A small dermatotomy was made at the access site with an 11 scalpel. A 0.018" wire was advanced into the SVC and the access needle exchanged for a 35F micropuncture vascular sheath. The 0.018" wire was then removed and a 0.035" wire advanced into the IVC. A 9 French sheath was placed over the wire, and a  combination of the Bentson wire an angled catheter were used to select the hepatic veins. Position of the catheter was confirmed with small contrast injection. Once the 65 cm Kumpe the catheter was confirmed within distal hepatic veins, the Bentson wire was removed. Catheter was then positioned within the selected hepatic vein for venogram. Free hepatic vein pressure was achieved. The catheter was then wedged in the distal hepatic vein into the parenchyma, gentle contrast was injected confirming location, and a wedge pressure was achieved. Given that this pressure appeared elevated, we then decided to make various measurements with a balloon occlusion catheter. The Bentson wire was advanced into the hepatic vein in the Kumpe the catheter was removed. An over the wire Fogarty balloon was advanced on the wire into the hepatic vein. Once the balloon Fogarty was within the hepatic vein the wire was removed. Contrast injected confirmed location within the hepatic vein. A free vein pressure was measured. The balloon was then inflated achieving stasis. Small contrast injection confirms stasis. Wedge pressure was then achieved. A series of 3 wedge pressure was then performed with slight variation in the position of the balloon occlusion catheter, each time confirming that the catheter was occlusive. Bentson wire was then advanced through the balloon catheter which was inflated, advancing the sheath into the hepatic vein. With the sheath in the hepatic vein, the balloon and wire were removed. The biopsy sheath/cannula was then advanced through the sheath into the selected hepatic veins. Once the cannula was positioned, 4 separate 20 gauge biopsy were achieved, placed into saline. Final injection was performed. Patient tolerated the procedure well and remained hemodynamically stable throughout. No complications were encountered and  no significant blood loss was encountered. FINDINGS: Right atrium: 9/3 (6) Hepatic Vein: 13/7  (10) Wedge Pressure: (29), (26), (28) Calculated portal venous pressure: Range is 16 - 19 mmHg IMPRESSION: Status post image guided access right internal jugular vein for hepatic venogram, pressure measurements, and medical liver biopsy. The series of hepatic vein pressure measurements confirms elevation of the portal pressures. Signed, Dulcy Fanny. Dellia Nims, RPVI [facility] Radiology Electronically Signed   By: Corrie Mckusick D.O.   On: 12/23/2020 14:06   IR Transcatheter BX  Result Date: 12/23/2020 INDICATION: 65 year old female with possible cirrhosis, possible portal hypertension EXAM: ULTRASOUND-GUIDED ACCESS RIGHT INTERNAL JUGULAR VEIN HEPATIC VENOGRAM WITH HEMODYNAMIC MEASURING TRANSJUGULAR LIVER BIOPSY MEDICATIONS: None. ANESTHESIA/SEDATION: Moderate (conscious) sedation was employed during this procedure. A total of Versed 0.5 mg and Fentanyl 25 mcg was administered intravenously. Moderate Sedation Time: 49 minutes. The patient's level of consciousness and vital signs were monitored continuously by radiology nursing throughout the procedure under my direct supervision. FLUOROSCOPY TIME:  Fluoroscopy Time: 6 minutes 48 seconds (47 mGy). COMPLICATIONS: None PROCEDURE: Informed written consent was obtained from the patient after a thorough discussion of the procedural risks, benefits and alternatives. All questions were addressed. Maximal Sterile Barrier Technique was utilized including caps, mask, sterile gowns, sterile gloves, sterile drape, hand hygiene and skin antiseptic. A timeout was performed prior to the initiation of the procedure The right neck and chest was prepped with chlorhexidine, and draped in the usual sterile fashion using maximum barrier technique (cap and mask, sterile gown, sterile gloves, large sterile sheet, hand hygiene and cutaneous antiseptic). Local anesthesia was attained by infiltration with 1% lidocaine without epinephrine.  Ultrasound demonstrated patency of the right internal jugular vein, and this was documented with an image. Under real-time ultrasound guidance, this vein was accessed with a 21 gauge micropuncture needle and image documentation was performed. A small dermatotomy was made at the access site with an 11 scalpel. A 0.018" wire was advanced into the SVC and the access needle exchanged for a 93F micropuncture vascular sheath. The 0.018" wire was then removed and a 0.035" wire advanced into the IVC. A 9 French sheath was placed over the wire, and a combination of the Bentson wire an angled catheter were used to select the hepatic veins. Position of the catheter was confirmed with small contrast injection. Once the 65 cm Kumpe the catheter was confirmed within distal hepatic veins, the Bentson wire was removed. Catheter was then positioned within the selected hepatic vein for venogram. Free hepatic vein pressure was achieved. The catheter was then wedged in the distal hepatic vein into the parenchyma, gentle contrast was injected confirming location, and a wedge pressure was achieved. Given that this pressure appeared elevated, we then decided to make various measurements with a balloon occlusion catheter. The Bentson wire was advanced into the hepatic vein in the Kumpe the catheter was removed. An over the wire Fogarty balloon was advanced on the wire into the hepatic vein. Once the balloon Fogarty was within the hepatic vein the wire was removed. Contrast injected confirmed location within the hepatic vein. A free vein pressure was measured. The balloon was then inflated achieving stasis. Small contrast injection confirms stasis. Wedge pressure was then achieved. A series of 3 wedge pressure was then performed with slight variation in the position of the balloon occlusion catheter, each time confirming that the catheter was occlusive. Bentson wire was then advanced through the balloon catheter which was inflated, advancing  the sheath into the hepatic vein. With the sheath in the hepatic vein, the balloon and wire were removed. The biopsy sheath/cannula was then advanced through the sheath into the selected hepatic veins. Once the cannula was positioned, 4 separate 20 gauge biopsy were achieved, placed into saline. Final injection was performed. Patient tolerated the procedure well and remained hemodynamically stable throughout. No complications were encountered and no significant blood loss was encountered. FINDINGS: Right atrium: 9/3 (6) Hepatic Vein: 13/7 (10) Wedge Pressure: (29), (26), (28) Calculated portal venous pressure: Range is 16 - 19 mmHg IMPRESSION: Status post image guided access right internal jugular vein for hepatic venogram, pressure measurements, and medical liver biopsy. The series of hepatic vein pressure measurements confirms elevation of the portal pressures. Signed, Dulcy Fanny. Dellia Nims, RPVI [facility] Radiology Electronically Signed   By: Corrie Mckusick D.O.   On: 12/23/2020 14:06   IR US Guide Vasc Access Right  Result Date: 12/23/2020 INDICATION: 65 year old female with possible cirrhosis, possible portal hypertension EXAM: ULTRASOUND-GUIDED ACCESS RIGHT INTERNAL JUGULAR VEIN HEPATIC VENOGRAM WITH HEMODYNAMIC MEASURING TRANSJUGULAR LIVER BIOPSY MEDICATIONS: None. ANESTHESIA/SEDATION: Moderate (conscious) sedation was employed during this procedure. A total of Versed 0.5 mg and Fentanyl 25 mcg was administered intravenously. Moderate Sedation Time: 49 minutes. The patient's level of consciousness and vital signs were monitored continuously by radiology nursing throughout the procedure under my direct supervision. FLUOROSCOPY TIME:  Fluoroscopy Time: 6 minutes 48 seconds (47 mGy). COMPLICATIONS: None PROCEDURE: Informed written consent was obtained from the patient after a thorough discussion of the procedural risks, benefits and alternatives. All questions  were addressed. Maximal Sterile Barrier Technique was utilized including caps, mask, sterile gowns, sterile gloves, sterile drape, hand hygiene and skin antiseptic. A timeout was performed prior to the initiation of the procedure The right neck and chest was prepped with chlorhexidine, and draped in the usual sterile fashion using maximum barrier technique (cap and mask, sterile gown, sterile gloves, large sterile sheet, hand hygiene and cutaneous antiseptic). Local anesthesia was attained by infiltration with 1% lidocaine without epinephrine. Ultrasound demonstrated patency of the right internal jugular vein, and this was documented with an image. Under real-time ultrasound guidance, this vein was accessed with a 21 gauge micropuncture needle and image documentation was performed. A small dermatotomy was made at the access site with an 11 scalpel. A 0.018" wire was advanced into the SVC and the access needle exchanged for a 96F micropuncture vascular sheath. The 0.018" wire was then removed and a 0.035" wire advanced into the IVC. A 9 French sheath was placed over the wire, and a combination of the Bentson wire an angled catheter were used to select the hepatic veins. Position of the catheter was confirmed with small contrast injection. Once the 65 cm Kumpe the catheter was confirmed within distal hepatic veins, the Bentson wire was removed. Catheter was then positioned within the selected hepatic vein for venogram. Free hepatic vein pressure was achieved. The catheter was then wedged in the distal hepatic vein into the parenchyma, gentle contrast was injected confirming location, and a wedge pressure was achieved. Given that this pressure appeared elevated, we then decided to make various measurements with a balloon occlusion catheter. The Bentson wire was advanced into the hepatic vein in the Kumpe the catheter was removed. An over the wire Fogarty balloon was advanced on the wire into the hepatic vein. Once the  balloon Fogarty was within the hepatic vein the wire was removed. Contrast injected confirmed  location within the hepatic vein. A free vein pressure was measured. The balloon was then inflated achieving stasis. Small contrast injection confirms stasis. Wedge pressure was then achieved. A series of 3 wedge pressure was then performed with slight variation in the position of the balloon occlusion catheter, each time confirming that the catheter was occlusive. Bentson wire was then advanced through the balloon catheter which was inflated, advancing the sheath into the hepatic vein. With the sheath in the hepatic vein, the balloon and wire were removed. The biopsy sheath/cannula was then advanced through the sheath into the selected hepatic veins. Once the cannula was positioned, 4 separate 20 gauge biopsy were achieved, placed into saline. Final injection was performed. Patient tolerated the procedure well and remained hemodynamically stable throughout. No complications were encountered and no significant blood loss was encountered. FINDINGS: Right atrium: 9/3 (6) Hepatic Vein: 13/7 (10) Wedge Pressure: (29), (26), (28) Calculated portal venous pressure: Range is 16 - 19 mmHg IMPRESSION: Status post image guided access right internal jugular vein for hepatic venogram, pressure measurements, and medical liver biopsy. The series of hepatic vein pressure measurements confirms elevation of the portal pressures. Signed, Dulcy Fanny. Dellia Nims, RPVI [facility] Radiology Special[facility] Radiology Electronically Signed   By: Corrie Mckusick D.O.   On: 12/23/2020 14:06       Assessment / Plan:   Assessment: 1.  Right sided effusion and a large defect of the diaphragm on the right side 2.  Elevated liver enzymes and a small amount of abdominal ascites 3.  Dysphagia: Status post EGD 12/21/2020 with esophageal candidiasis and gastritis, EUS also done and biliary tree was cleared  Plan: 1.  Wedge  pressures were elevated at time of liver biopsy yesterday, biopsy results are still pending themselves 2.  Please await further recommendations from Dr. Havery Moros later today  Thank you for kind consultation.    LOS: 14 days   [name]  12/24/2020, 11:38 AM

## 2020-12-24 NOTE — Progress Notes (Addendum)
      [address]       [place] [address]             [phone]       3 Days Post-Op Procedure(s) (LRB): ESOPHAGOGASTRODUODENOSCOPY (EGD) WITH PROPOFOL (N/A) BIOPSY (N/A) UPPER ENDOSCOPIC ULTRASOUND (EUS) LINEAR (N/A)  Subjective: Patient states she has no breathing problem this am  Objective: Vital signs in last 24 hours: Temp:  [97.6 F (36.4 C)] 97.6 F (36.4 C) (02/07 1530) Pulse Rate:  [80-83] 80 (02/07 1530) Cardiac Rhythm: Normal sinus rhythm (02/07 1145) Resp:  [12-20] 17 (02/07 1530) BP: (91-105)/(36-56) 96/56 (02/07 1800) SpO2:  [93 %-97 %] 95 % (02/07 1530)     Intake/Output from previous day: 02/07 0701 - 02/08 0700 In: 337.9 [I.V.:288; IV Piggyback:49.9] Out: 1375 [Urine:375; Chest Tube:1000]   Physical Exam:  Cardiovascular: RRR Pulmonary: Clear to auscultation on the left and diminished right base Abdomen: Soft, non tender, bowel sounds present. Wounds: Dressing is clean and dry.     Lab Results: CBC: Recent Labs    12/23/20 0047 12/24/20 0306  WBC 13.0* 13.1*  HGB 9.8* 10.4*  HCT 26.5* 28.7*  PLT 216 210   BMET:  Recent Labs    12/23/20 0047 12/24/20 0306  NA 128* 130*  K 3.4* 3.8  CL 101 104  CO2 16* 16*  GLUCOSE 97 127*  BUN 26* 25*  CREATININE 1.83* 1.62*  CALCIUM 8.1* 8.3*    PT/INR: No results for input(s): LABPROT, INR in the last 72 hours. ABG:  INR: Will add last result for INR, ABG once components are confirmed Will add last 4 CBG results once components are confirmed  Assessment/Plan:  1. CV - SR with HR in the 70-80's 2.  Pulmonary - S/p right Pleur X catheter 02/04. Pleur X had 1000 ml removed yesterday.  Dr. Servando Snare reviewed CT with daughter and patient yesterday.As discussed with Dr. Servando Snare, there is no evidence of repairable diaphragmatic hernia;she does, however,  have an abdominal wall hernia but will defer to general/trauma surgery if that needs surgical intervention.  3.  Management per primary;TCTS will see PRN  Sharalyn Ink ZimmermanPA-C 12/24/2020,7:41 AM [phone]

## 2020-12-24 NOTE — Plan of Care (Signed)

## 2020-12-24 NOTE — Progress Notes (Signed)
Pt BP 90/40 asymptomatic, MD made aware, no new orders.

## 2020-12-24 NOTE — Progress Notes (Signed)
[facility] for Heparin Indication: history of pulmonary embolus  Labs: Recent Labs    12/21/20 0609 12/22/20 0101 12/22/20 1455 12/23/20 0047 12/23/20 1208 12/23/20 1926 12/24/20 0306  HGB 10.6* 10.8*  --  9.8*  --   --  10.4*  HCT 30.1* 30.3*  --  26.5*  --   --  28.7*  PLT 246 256  --  216  --   --  210  HEPARINUNFRC 0.26*  --    < > 0.21* <0.10* 0.19* 0.47  CREATININE 1.73* 1.83*  --  1.83*  --   --   --    < > = values in this interval not displayed.    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 Per Dr. Melvyn Novas 05/05/18: continue half dose for life, but see a hematologist to consider discontinuation in the future.  Pharmacy consulted to dose heparin infusion for history of PE.  Patient is s/p Pleurx cath placement, chest tube removal and liver biopsy 12/23/20.   Heparin level therapeutic (0.47) on gtt at 1500 units/hr. No bleeding noted.  Goal of Therapy:  Heparin level 0.3-0.7 units/mL Monitor platelets by anticoagulation protocol: Yes   Plan:  Continue heparin infusion at 1500 units/hr F/u 6 hr to confirm therapeutic  Sherlon Handing, PharmD, BCPS Please see amion for complete clinical pharmacist phone list 12/24/2020 4:16 AM

## 2020-12-24 NOTE — Progress Notes (Signed)
ANTICOAGULATION CONSULT NOTE  Pharmacy Consult for Heparin Indication: pulmonary embolus  Labs: Recent Labs    12/22/20 0101 12/22/20 1455 12/23/20 0047 12/23/20 1208 12/23/20 1926 12/24/20 0306 12/24/20 1157  HGB 10.8*  --  9.8*  --   --  10.4*  --   HCT 30.3*  --  26.5*  --   --  28.7*  --   PLT 256  --  216  --   --  210  --   HEPARINUNFRC  --    < > 0.21*   < > 0.19* 0.47 0.47  CREATININE 1.83*  --  1.83*  --   --  1.62*  --    < > = values in this interval not displayed.    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 Per Dr. Melvyn Novas 05/05/18: continue half dose for life, but see a hematologist to consider discontinuation in the future.  Pharmacy consulted to dose heparin infusion for history of PE.  Patient is s/p Pleurx cath placement, chest tube removal and liver biopsy on 12/23/20 and heparin resumed afterward.  Heparin level therapeutic; CBC stable, no bleeding reported.  Goal of Therapy:  Heparin level 0.3-0.7 units/mL Monitor platelets by anticoagulation protocol: Yes   Plan:  Continue heparin infusion at 1500 units/hr Daily heparin level and CBC  Arrianna Catala D. Mina Marble, PharmD, BCPS, [place] 12/24/2020, 12:57 PM

## 2020-12-25 DIAGNOSIS — E669 Obesity, unspecified: Secondary | ICD-10-CM | POA: Diagnosis not present

## 2020-12-25 DIAGNOSIS — K7460 Unspecified cirrhosis of liver: Principal | ICD-10-CM

## 2020-12-25 DIAGNOSIS — J90 Pleural effusion, not elsewhere classified: Secondary | ICD-10-CM | POA: Diagnosis not present

## 2020-12-25 DIAGNOSIS — I700 Atherosclerosis of aorta: Secondary | ICD-10-CM | POA: Diagnosis not present

## 2020-12-25 DIAGNOSIS — R188 Other ascites: Secondary | ICD-10-CM | POA: Diagnosis not present

## 2020-12-25 DIAGNOSIS — K219 Gastro-esophageal reflux disease without esophagitis: Secondary | ICD-10-CM

## 2020-12-25 DIAGNOSIS — R7303 Prediabetes: Secondary | ICD-10-CM

## 2020-12-25 DIAGNOSIS — E782 Mixed hyperlipidemia: Secondary | ICD-10-CM

## 2020-12-25 LAB — GLUCOSE, CAPILLARY
Glucose-Capillary: 125 mg/dL — ABNORMAL HIGH (ref 70–99)
Glucose-Capillary: 96 mg/dL (ref 70–99)
Glucose-Capillary: 103 mg/dL — ABNORMAL HIGH (ref 70–99)
Glucose-Capillary: 99 mg/dL (ref 70–99)
Glucose-Capillary: 102 mg/dL — ABNORMAL HIGH (ref 70–99)

## 2020-12-25 LAB — CBC WITH DIFFERENTIAL/PLATELET
Abs Immature Granulocytes: 0.05 10*3/uL (ref 0.00–0.07)
Basophils Absolute: 0.1 10*3/uL (ref 0.0–0.1)
Basophils Relative: 0 %
Eosinophils Absolute: 0.6 10*3/uL — ABNORMAL HIGH (ref 0.0–0.5)
Eosinophils Relative: 4 %
HCT: 26.8 % — ABNORMAL LOW (ref 36.0–46.0)
Hemoglobin: 9.4 g/dL — ABNORMAL LOW (ref 12.0–15.0)
Immature Granulocytes: 0 %
Lymphocytes Relative: 16 %
Lymphs Abs: 2.1 10*3/uL (ref 0.7–4.0)
MCH: 27.2 pg (ref 26.0–34.0)
MCHC: 35.1 g/dL (ref 30.0–36.0)
MCV: 77.5 fL — ABNORMAL LOW (ref 80.0–100.0)
Monocytes Absolute: 1.2 10*3/uL — ABNORMAL HIGH (ref 0.1–1.0)
Monocytes Relative: 9 %
Neutro Abs: 9.1 10*3/uL — ABNORMAL HIGH (ref 1.7–7.7)
Neutrophils Relative %: 71 %
Platelets: 207 10*3/uL (ref 150–400)
RBC: 3.46 MIL/uL — ABNORMAL LOW (ref 3.87–5.11)
RDW: 23.5 % — ABNORMAL HIGH (ref 11.5–15.5)
WBC: 13.1 10*3/uL — ABNORMAL HIGH (ref 4.0–10.5)
nRBC: 0 % (ref 0.0–0.2)

## 2020-12-25 LAB — HEPARIN LEVEL (UNFRACTIONATED): Heparin Unfractionated: 0.47 IU/mL (ref 0.30–0.70)

## 2020-12-25 LAB — COMPREHENSIVE METABOLIC PANEL
ALT: 52 U/L — ABNORMAL HIGH (ref 0–44)
AST: 208 U/L — ABNORMAL HIGH (ref 15–41)
Albumin: 2.3 g/dL — ABNORMAL LOW (ref 3.5–5.0)
Alkaline Phosphatase: 146 U/L — ABNORMAL HIGH (ref 38–126)
Anion gap: 9 (ref 5–15)
BUN: 23 mg/dL (ref 8–23)
CO2: 16 mmol/L — ABNORMAL LOW (ref 22–32)
Calcium: 8.3 mg/dL — ABNORMAL LOW (ref 8.9–10.3)
Chloride: 108 mmol/L (ref 98–111)
Creatinine, Ser: 1.45 mg/dL — ABNORMAL HIGH (ref 0.44–1.00)
GFR, Estimated: 40 mL/min — ABNORMAL LOW (ref >60)
Glucose, Bld: 129 mg/dL — ABNORMAL HIGH (ref 70–99)
Potassium: 3.7 mmol/L (ref 3.5–5.1)
Sodium: 133 mmol/L — ABNORMAL LOW (ref 135–145)
Total Bilirubin: 3.2 mg/dL — ABNORMAL HIGH (ref 0.3–1.2)
Total Protein: 5.3 g/dL — ABNORMAL LOW (ref 6.5–8.1)
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
## 2020-12-25 NOTE — Plan of Care (Signed)

## 2020-12-25 NOTE — Progress Notes (Signed)
Drained pt PleurX of about 1100 ml. BP 106/47 SPO2 93%. Pt tolerated very well, no dizziness. Informed Raiford Noble Latif DO via secure chat. No new orders.
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
## 2020-12-25 NOTE — Progress Notes (Signed)
ANTICOAGULATION CONSULT NOTE  Pharmacy Consult for Heparin Indication: pulmonary embolus  Labs: Recent Labs    12/23/20 0047 12/23/20 1208 12/24/20 0306 12/24/20 1157 12/25/20 0156  HGB 9.8*  --  10.4*  --  9.4*  HCT 26.5*  --  28.7*  --  26.8*  PLT 216  --  210  --  207  HEPARINUNFRC 0.21*   < > 0.47 0.47 0.47  CREATININE 1.83*  --  1.62*  --  1.45*   < > = values in this interval not displayed.    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 Per Dr. Melvyn Novas 05/05/18: continue half dose for life, but see a hematologist to consider discontinuation in the future.  Pharmacy consulted to dose heparin infusion for history of PE.  Patient is s/p Pleurx cath placement, chest tube removal and liver biopsy on 12/23/20 and heparin resumed afterward.  Heparin level therapeutic; CBC stable, no bleeding reported.  Goal of Therapy:  Heparin level 0.3-0.7 units/mL Monitor platelets by anticoagulation protocol: Yes   Plan:  Continue heparin infusion at 1500 units/hr Daily heparin level and CBC  Barth Kirks, PharmD, BCPS, BCCCP Clinical Pharmacist [phone]  Please check AMION for all [facility] numbers  12/25/2020 7:41 AM

## 2020-12-25 NOTE — Consult Note (Signed)
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 procedural risks, benefits and alternatives. All questions were addressed. Maximal Sterile Barrier Technique was utilized including caps, mask, sterile gowns, sterile gloves, sterile drape, hand hygiene and skin antiseptic. A timeout was performed prior to the initiation of the procedure The right neck and chest was prepped with chlorhexidine, and draped in the usual sterile fashion using maximum barrier technique (cap and mask, sterile gown, sterile gloves, large sterile sheet, hand hygiene and cutaneous antiseptic). Local anesthesia was attained by infiltration with 1% lidocaine without epinephrine. Ultrasound demonstrated patency of the right internal jugular vein, and this was documented with an image. Under real-time ultrasound guidance, this vein was accessed with a 21 gauge micropuncture needle and image documentation was performed. A small dermatotomy was made at the access site with an 11 scalpel. A 0.018" wire was advanced into the SVC and the access needle exchanged  for a 48F micropuncture vascular sheath. The 0.018" wire was then removed and a 0.035" wire advanced into the IVC. A 9 French sheath was placed over the wire, and a combination of the Bentson wire an angled catheter were used to select the hepatic veins. Position of the catheter was confirmed with small contrast injection. Once the 65 cm Kumpe the catheter was confirmed within distal hepatic veins, the Bentson wire was removed. Catheter was then positioned within the selected hepatic vein for venogram. Free hepatic vein pressure was achieved. The catheter was then wedged in the distal hepatic vein into the parenchyma, gentle contrast was injected confirming location, and a wedge pressure was achieved. Given that this pressure appeared elevated, we then decided to make  various measurements with a balloon occlusion catheter. The Bentson wire was advanced into the hepatic vein in the Kumpe the catheter was removed. An over the wire Fogarty balloon was advanced on the wire into the hepatic vein. Once the balloon Fogarty was within the hepatic vein the wire was removed. Contrast injected confirmed location within the hepatic vein. A free vein pressure was measured. The balloon was then inflated achieving stasis. Small contrast injection confirms stasis. Wedge pressure was then achieved. A series of 3 wedge pressure was then performed with slight variation in the position of the balloon occlusion catheter, each time confirming that the catheter was occlusive. Bentson wire was then advanced through the balloon catheter which was inflated, advancing the sheath into the hepatic vein. With the sheath in the hepatic vein, the balloon and wire were removed. The biopsy sheath/cannula was then advanced through the sheath into the selected hepatic veins. Once the cannula was positioned, 4 separate 20 gauge biopsy were achieved, placed into saline. Final injection was performed. Patient tolerated the procedure well and remained  hemodynamically stable throughout. No complications were encountered and no significant blood loss was encountered. FINDINGS: Right atrium: 9/3 (6) Hepatic Vein: 13/7 (10) Wedge Pressure: (29), (26), (28) Calculated portal venous pressure: Range is 16 - 19 mmHg IMPRESSION: Status post image guided access right internal jugular vein for hepatic venogram, pressure measurements, and medical liver biopsy. The series of hepatic vein pressure measurements confirms elevation of the portal pressures. Signed, Dulcy Fanny. Dellia Nims, RPVI [facility] Radiology Electronically Signed   By: Corrie Mckusick D.O.   On: 12/23/2020 14:06   US Abdomen Limited  Result Date: 12/17/2020 CLINICAL DATA:  Ascites check EXAM: LIMITED ABDOMEN ULTRASOUND FOR ASCITES TECHNIQUE: Limited ultrasound survey for ascites was performed in all four abdominal quadrants. COMPARISON:  Ultrasound abdomen 12/12/2020. FINDINGS: Trace perisplenic free fluid. Limited by large overlying abdominal bandage. IMPRESSION: Trace perisplenic ascites. Limited by large overlying abdominal bandage. Electronically Signed   By: Iven Finn M.D.   On: 12/17/2020 05:25   IR US Guide Vasc Access Right  Result Date: 12/23/2020 INDICATION: 65 year old female with possible cirrhosis, possible portal hypertension EXAM: ULTRASOUND-GUIDED ACCESS RIGHT INTERNAL JUGULAR VEIN HEPATIC VENOGRAM WITH HEMODYNAMIC MEASURING TRANSJUGULAR LIVER BIOPSY MEDICATIONS: None. ANESTHESIA/SEDATION: Moderate (conscious) sedation was employed during this procedure. A total of Versed 0.5 mg and Fentanyl 25 mcg was administered intravenously. Moderate Sedation Time: 49 minutes. The patient's level of consciousness and vital signs were monitored continuously by radiology nursing throughout the procedure under my direct supervision. FLUOROSCOPY TIME:  Fluoroscopy Time: 6 minutes 48 seconds (47 mGy). COMPLICATIONS: None PROCEDURE: Informed written  consent was obtained from the patient after a thorough discussion of the procedural risks, benefits and alternatives. All questions were addressed. Maximal Sterile Barrier Technique was utilized including caps, mask, sterile gowns, sterile gloves, sterile drape, hand hygiene and skin antiseptic. A timeout was performed prior to the initiation of the procedure The right neck and chest was prepped with chlorhexidine, and draped in the usual sterile fashion using maximum barrier technique (cap and mask, sterile gown, sterile gloves, large sterile sheet, hand hygiene and cutaneous antiseptic). Local anesthesia was attained by infiltration with 1% lidocaine without epinephrine. Ultrasound demonstrated patency of the right internal jugular vein, and this was documented with an image. Under real-time ultrasound guidance, this vein was accessed with a 21 gauge micropuncture needle and image documentation was performed. A small dermatotomy was made at the access site with an  11 scalpel. A 0.018" wire was advanced into the SVC and the access needle exchanged for a 4F micropuncture vascular sheath. The 0.018" wire was then removed and a 0.035" wire advanced into the IVC. A 9 French sheath was placed over the wire, and a combination of the Bentson wire an angled catheter were used to select the hepatic veins. Position of the catheter was confirmed with small contrast injection. Once the 65 cm Kumpe the catheter was confirmed within distal hepatic veins, the Bentson wire was removed. Catheter was then positioned within the selected hepatic vein for venogram. Free hepatic vein pressure was achieved. The catheter was then wedged in the distal hepatic vein into the parenchyma, gentle contrast was injected confirming location, and a wedge pressure was achieved. Given that this pressure appeared elevated, we then decided to make various measurements with a balloon occlusion catheter. The Bentson wire was advanced into the hepatic  vein in the Kumpe the catheter was removed. An over the wire Fogarty balloon was advanced on the wire into the hepatic vein. Once the balloon Fogarty was within the hepatic vein the wire was removed. Contrast injected confirmed location within the hepatic vein. A free vein pressure was measured. The balloon was then inflated achieving stasis. Small contrast injection confirms stasis. Wedge pressure was then achieved. A series of 3 wedge pressure was then performed with slight variation in the position of the balloon occlusion catheter, each time confirming that the catheter was occlusive. Bentson wire was then advanced through the balloon catheter which was inflated, advancing the sheath into the hepatic vein. With the sheath in the hepatic vein, the balloon and wire were removed. The biopsy sheath/cannula was then advanced through the sheath into the selected hepatic veins. Once the cannula was positioned, 4 separate 20 gauge biopsy were achieved, placed into saline. Final injection was performed. Patient tolerated the procedure well and remained hemodynamically stable throughout. No complications were encountered and no significant blood loss was encountered. FINDINGS: Right atrium: 9/3 (6) Hepatic Vein: 13/7 (10) Wedge Pressure: (29), (26), (28) Calculated portal venous pressure: Range is 16 - 19 mmHg IMPRESSION: Status post image guided access right internal jugular vein for hepatic venogram, pressure measurements, and medical liver biopsy. The series of hepatic vein pressure measurements confirms elevation of the portal pressures. Signed, Dulcy Fanny. Dellia Nims, RPVI [facility] Inter[facility] Radiology Special[facility] Radiology Electronically Signed   By: Corrie Mckusick D.O.   On: 12/23/2020 14:06   DG CHEST PORT 1 VIEW  Result Date: 12/20/2020 CLINICAL DATA:  PleurX catheter placement EXAM: PORTABLE CHEST 1 VIEW COMPARISON:  12/20/2020 FINDINGS: In the interval, right basilar pigtail chest tube has  been removed and a tunneled PleurX drainage catheter has been place at the right lung base extending to the cardiophrenic angle. Small right pneumothorax has developed. No evidence of tension physiology. The lungs are clear. No pneumothorax or pleural effusion on the left. Cardiac size within normal limits. Moderate hiatal hernia noted. Pulmonary vascularity is normal. IMPRESSION: Interval right basilar PleurX drainage catheter placement. Small right apical pneumothorax now present. Electronically Signed   By: Fidela Salisbury MD   On: 12/20/2020 15:30   DG Chest Port 1 View  Result Date: 12/20/2020 CLINICAL DATA:  Follow-up chest tube EXAM: PORTABLE CHEST 1 VIEW COMPARISON:  12/19/2020 FINDINGS: Cardiac shadow is stable. Aortic calcifications are again seen. Pigtail catheter is noted on the right stable in appearance. No sizable effusion is seen. No pneumothorax is noted. No bony abnormality is  seen. IMPRESSION: No acute abnormality noted. Previously seen right-sided effusion is less well visualized. Electronically Signed   By: Inez Catalina M.D.   On: 12/20/2020 03:58   DG CHEST PORT 1 VIEW  Result Date: 12/19/2020 CLINICAL DATA:  Pleural effusion EXAM: PORTABLE CHEST 1 VIEW COMPARISON:  December 19, 2020, December 18, 2020, December 09, 2020 FINDINGS: The cardiomediastinal silhouette is unchanged in contour.Small RIGHT pleural effusion with RIGHT-sided chest tube in place. No significant pneumothorax. Persistent homogeneous opacification of the RIGHT lung base. Moderate hiatal hernia delineated by enteric contrast. No acute osseous abnormality. IMPRESSION: Small RIGHT pleural effusion with RIGHT-sided chest tube in place. No significant pneumothorax. Electronically Signed   By: Valentino Saxon MD   On: 12/19/2020 09:37   DG CHEST PORT 1 VIEW  Result Date: 12/18/2020 CLINICAL DATA:  Follow-up pleural effusion and drain, initial encounter EXAM: PORTABLE CHEST 1 VIEW COMPARISON:  12/16/2020 FINDINGS: Cardiac  shadow is stable. Aortic calcifications are again seen. Mild vascular congestion is noted new from the prior study. Right pigtail catheter is noted in place without evidence of pneumothorax. Slight increased density is noted likely related to posteriorly layering effusion. No bony abnormality is seen. IMPRESSION: Likely small posterior fusion on the right. Mild central vascular congestion is noted. Electronically Signed   By: Inez Catalina M.D.   On: 12/18/2020 03:26   DG CHEST PORT 1 VIEW  Result Date: 12/16/2020 CLINICAL DATA:  Status post right-sided pleural drainage catheter placement for pleural effusion on 12/13/2020 EXAM: PORTABLE CHEST 1 VIEW COMPARISON:  12/15/2020 FINDINGS: The heart size and mediastinal contours are within normal limits. No further visualized pneumothorax. No further visualized pleural fluid. Improved aeration of the right lung. There is no evidence of pulmonary edema or focal airspace disease. The visualized skeletal structures are unremarkable. IMPRESSION: No further visualized right pleural fluid with improved aeration of the right lung. Resolved right pneumothorax. Electronically Signed   By: Aletta Edouard M.D.   On: 12/16/2020 08:18   DG CHEST PORT 1 VIEW  Result Date: 12/15/2020 CLINICAL DATA:  , Shortness of breath, RIGHT apex pneumothorax post thoracostomy tube placement, hiatal hernia EXAM: PORTABLE CHEST 1 VIEW COMPARISON:  Portable exam 0656 hours compared to 12/14/2020 FINDINGS: Pigtail RIGHT thoracostomy tube again identified. Tiny RIGHT apex pneumothorax, slightly decreased from previous exam. Normal heart size and pulmonary vascularity. Density at the medial inferior LEFT hemithorax corresponds to hiatal hernia on a prior CT. Atherosclerotic calcification aorta. Pleural effusion and atelectasis at RIGHT lung base. Remaining lungs clear. No pneumothorax. Bones demineralized. IMPRESSION: Hiatal hernia. Tiny RIGHT apex pneumothorax, decreased from previous exam.  Persistent mild RIGHT basilar atelectasis and effusion. Electronically Signed   By: Lavonia Dana M.D.   On: 12/15/2020 09:22   DG CHEST PORT 1 VIEW  Result Date: 12/14/2020 CLINICAL DATA:  Recurrent right pleural effusion. EXAM: PORTABLE CHEST 1 VIEW COMPARISON:  12/13/2020 FINDINGS: Interval right pleural pigtail catheter. Mild residual ill-defined opacity at the right lung base. Approximately 5% right apical pneumothorax. Moderately large hiatal hernia. Borderline enlarged cardiac silhouette. Clear left lung. Cholecystectomy clips. No acute bony abnormality. IMPRESSION: 1. Approximately 5% right apical pneumothorax with a right pleural pigtail catheter in place. 2. Mild residual right basilar pleural fluid/atelectasis. 3. Moderately large hiatal hernia. Electronically Signed   By: Claudie Revering M.D.   On: 12/14/2020 12:17   DG CHEST PORT 1 VIEW  Result Date: 12/13/2020 CLINICAL DATA:  Shortness of breath, pleural effusion. EXAM: PORTABLE CHEST 1 VIEW COMPARISON:  12/11/2020  and prior. FINDINGS: No pneumothorax. Decreased right pleural effusion. Partially obscured cardiomediastinal silhouette. Interval increase in basilar predominant patchy opacities. IMPRESSION: Decreased right pleural effusion.  No pneumothorax. Increased basilar predominant opacities. Electronically Signed   By: Primitivo Gauze M.D.   On: 12/13/2020 08:23   DG Chest Portable 1 View  Result Date: 12/09/2020 CLINICAL DATA:  Shortness of breath EXAM: PORTABLE CHEST 1 VIEW COMPARISON:  08/19/2020 FINDINGS: Cardiomegaly with aortic atherosclerosis. Probable right pleural effusion. Dense airspace disease at the right middle lobe and right base. Vascular congestion. No pneumothorax. IMPRESSION: 1. Cardiomegaly with vascular congestion. 2. Probable right pleural effusion with increased dense airspace disease at the right middle lobe and right base, atelectasis versus pneumonia. Electronically Signed   By: Donavan Foil M.D.   On:  12/09/2020 21:06   DG C-Arm 1-60 Min-No Report  Result Date: 12/20/2020 Fluoroscopy was utilized by the requesting physician.  No radiographic interpretation.   ECHOCARDIOGRAM COMPLETE  Result Date: 12/13/2020    ECHOCARDIOGRAM REPORT   Patient Name:   CASS EDINGER Date of Exam: 12/13/2020 Medical Rec #:  [id number]      Height:       65.0 in Accession #:    [id number]     Weight:       200.0 lb Date of Birth:  1956-05-03       BSA:          1.978 m Patient Age:    37 years       BP:           103/47 mmHg Patient Gender: F              HR:           82 bpm. Exam Location:  Inpatient Procedure: 2D Echo, Cardiac Doppler and Color Doppler Indications:    Dyspnea  History:        Patient has prior history of Echocardiogram examinations, most                 recent 03/11/2018. Risk Factors:Dyslipidemia. GERD.  Sonographer:    Clayton Lefort RDCS (AE) Referring Phys: [id number] [facility]  Sonographer Comments: Image acquisition challenging due to respiratory motion. IMPRESSIONS  1. Left ventricular ejection fraction, by estimation, is 60 to 65%. The left ventricle has normal function. The left ventricle has no regional wall motion abnormalities. There is mild left ventricular hypertrophy. Left ventricular diastolic parameters are consistent with Grade I diastolic dysfunction (impaired relaxation).  2. Right ventricular systolic function is normal. The right ventricular size is normal. There is normal pulmonary artery systolic pressure. The estimated right ventricular systolic pressure is 02.1 mmHg.  3. The mitral valve is grossly normal. Trivial mitral valve regurgitation.  4. The aortic valve is tricuspid. Aortic valve regurgitation is not visualized. Mild aortic valve sclerosis is present, with no evidence of aortic valve stenosis.  5. The inferior vena cava is normal in size with greater than 50% respiratory variability, suggesting right atrial pressure of 3 mmHg. Comparison(s): Prior images unable to be directly viewed,  comparison made by report only. Changes from prior study are noted. 03/11/2018: LVEF 60-65%. FINDINGS  Left Ventricle: Left ventricular ejection fraction, by estimation, is 60 to 65%. The left ventricle has normal function. The left ventricle has no regional wall motion abnormalities. The left ventricular internal cavity size was normal in size. There is  mild left ventricular hypertrophy. Left ventricular diastolic parameters are consistent with Grade I diastolic dysfunction (impaired relaxation).  Indeterminate filling pressures. Right Ventricle: The right ventricular size is normal. No increase in right ventricular wall thickness. Right ventricular systolic function is normal. There is normal pulmonary artery systolic pressure. The tricuspid regurgitant velocity is 2.80 m/s, and  with an assumed right atrial pressure of 3 mmHg, the estimated right ventricular systolic pressure is 47.0 mmHg. Left Atrium: Left atrial size was normal in size. Right Atrium: Right atrial size was normal in size. Pericardium: There is no evidence of pericardial effusion. Mitral Valve: The mitral valve is grossly normal. Trivial mitral valve regurgitation. Tricuspid Valve: The tricuspid valve is grossly normal. Tricuspid valve regurgitation is trivial. Aortic Valve: The aortic valve is tricuspid. Aortic valve regurgitation is not visualized. Mild aortic valve sclerosis is present, with no evidence of aortic valve stenosis. Aortic valve mean gradient measures 6.0 mmHg. Aortic valve peak gradient measures 12.4 mmHg. Aortic valve area, by VTI measures 2.28 cm. Pulmonic Valve: The pulmonic valve was normal in structure. Pulmonic valve regurgitation is not visualized. Aorta: The aortic root and ascending aorta are structurally normal, with no evidence of dilitation. Venous: The inferior vena cava is normal in size with greater than 50% respiratory variability, suggesting right atrial pressure of 3 mmHg. IAS/Shunts: No atrial level shunt  detected by color flow Doppler.  LEFT VENTRICLE PLAX 2D LVIDd:         4.80 cm  Diastology LVIDs:         3.10 cm  LV e' medial:    7.18 cm/s LV PW:         1.40 cm  LV E/e' medial:  15.7 LV IVS:        1.20 cm  LV e' lateral:   10.80 cm/s LVOT diam:     1.90 cm  LV E/e' lateral: 10.5 LV SV:         85 LV SV Index:   43 LVOT Area:     2.84 cm  RIGHT VENTRICLE             IVC RV Basal diam:  3.10 cm     IVC diam: 1.50 cm RV S prime:     14.10 cm/s TAPSE (M-mode): 2.3 cm LEFT ATRIUM           Index       RIGHT ATRIUM           Index LA diam:      3.70 cm 1.87 cm/m  RA Area:     15.00 cm LA Vol (A2C): 54.6 ml 27.60 ml/m RA Volume:   36.30 ml  18.35 ml/m LA Vol (A4C): 41.3 ml 20.88 ml/m  AORTIC VALVE AV Area (Vmax):    2.35 cm AV Area (Vmean):   2.28 cm AV Area (VTI):     2.28 cm AV Vmax:           176.00 cm/s AV Vmean:          119.000 cm/s AV VTI:            0.375 m AV Peak Grad:      12.4 mmHg AV Mean Grad:      6.0 mmHg LVOT Vmax:         146.00 cm/s LVOT Vmean:        95.500 cm/s LVOT VTI:          0.301 m LVOT/AV VTI ratio: 0.80  AORTA Ao Root diam: 3.10 cm Ao Asc diam:  3.20 cm MITRAL VALVE  TRICUSPID VALVE MV Area (PHT): 3.91 cm     TR Peak grad:   31.4 mmHg MV Decel Time: 194 msec     TR Vmax:        280.00 cm/s MV E velocity: 113.00 cm/s MV A velocity: 109.00 cm/s  SHUNTS MV E/A ratio:  1.04         Systemic VTI:  0.30 m                             Systemic Diam: 1.90 cm Lyman Bishop MD Electronically signed by Lyman Bishop MD Signature Date/Time: 12/13/2020/3:16:40 PM    Final    DG ESOPHAGUS W SINGLE CM (SOL OR THIN BA)  Result Date: 12/19/2020 CLINICAL DATA:  Esophageal dysphagia.  Food getting stuck. EXAM: ESOPHOGRAM/BARIUM SWALLOW TECHNIQUE: Single contrast examination was performed using  thin barium. FLUOROSCOPY TIME:  Fluoroscopy Time:  2 minutes and 6 seconds Radiation Exposure Index (if provided by the fluoroscopic device): 22.40 mGy Number of Acquired Spot Images: 0  COMPARISON:  Chest CT 12/09/2020 FINDINGS: Esophageal dysmotility with disruption of the primary peristaltic wave, tertiary contractions and intermittent esophageal spasm. There is a moderate to large hiatal hernia noted with a strictured narrowing at the GE junction. The 13 mm barium pill would not pass through this area. IMPRESSION: 1. Moderate to large hiatal hernia. 2. 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 The 13 mm barium pill would not pass through this area. Recommend endoscopic evaluation and potential treatment. 3. Esophageal dysmotility and moderate stasis. Electronically Signed   By: Marijo Sanes M.D.   On: 12/19/2020 08:55   IR PERC PLEURAL DRAIN W/INDWELL CATH W/IMG GUIDE  Result Date: 12/17/2020 INDICATION: 65 year old female with a history right-sided pleural effusion.  EXAM: IMAGE GUIDED PLACEMENT OF RIGHT-SIDED PLEURAL DRAIN  MEDICATIONS: The patient is currently admitted to the hospital and receiving intravenous antibiotics. The antibiotics were administered within an appropriate time frame prior to the initiation of the procedure.  ANESTHESIA/SEDATION: Fentanyl 25 mcg IV; Versed 0.5 mg IV  Moderate Sedation Time:  11 minutes  The patient was continuously monitored during the procedure by the interventional radiology nurse under my direct supervision.  COMPLICATIONS: None  PROCEDURE: The procedure, risks, benefits, and alternatives were explained to the patient/patient's family, who provided informed consent on the patient's behalf. Specific risks that were addressed included bleeding, infection, ongoing pneumothorax, need for further procedure/surgery, chance of hemorrhage, hemoptysis, cardiopulmonary collapse, death. Questions regarding the procedure were encouraged and answered. The patient understands and consents to the procedure.  Patient was positioned in the right anterior oblique position on the IR table and scout image of the chest was performed for planning purposes.  The  right mid axillary line at the level of the nipple was identified, and prepped and draped in the usual sterile fashion. The skin and subcutaneous tissues were generously infiltrated 1% lidocaine for local anesthesia.  A trocar needle was then used to enter the pleural space using ultrasound guidance. The inner trocar introducer was removed and an 035 guidewire was advanced to the apex of the lung under fluoroscopy. Dilation of the skin tract was performed over the wire, and then modified Seldinger technique was used to place a 10 French pigtail catheter into the pleural space.  Catheter was attached to water seal chamber and suction was applied confirming a operational chest tube.  Retention suture was placed.  Sterile dressing was placed.  Patient tolerated the procedure  well and remained hemodynamically stable throughout.  No complications were encountered and no significant blood loss was encounter  IMPRESSION: Status post image guided right-sided pleural drainage catheter.  Signed,  Dulcy Fanny. Dellia Nims, RPVI  [facility]  [facility] Radiology   Electronically Signed   By: Corrie Mckusick D.O.   On: 12/13/2020 18:04  US THORACENTESIS ASP PLEURAL SPACE W/IMG GUIDE  Result Date: 12/10/2020 INDICATION: RIGHT pleural effusion and shortness of breath EXAM: ULTRASOUND GUIDED DIAGNOSTIC AND THERAPEUTIC RIGHT THORACENTESIS MEDICATIONS: None. COMPLICATIONS: None immediate. PROCEDURE: An ultrasound guided thoracentesis was thoroughly discussed with the patient and questions answered. The benefits, risks, alternatives and complications were also discussed. The patient understands and wishes to proceed with the procedure. Written consent was obtained. Ultrasound was performed to localize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

## 2020-12-25 NOTE — Progress Notes (Signed)
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 hygiene and skin antiseptic. A timeout was performed prior to the initiation of the procedure The right neck and chest was prepped with chlorhexidine, and draped in the usual sterile fashion using maximum barrier technique (cap and mask, sterile gown, sterile gloves, large sterile sheet, hand hygiene and cutaneous antiseptic). Local anesthesia was attained by infiltration with 1% lidocaine without epinephrine. Ultrasound demonstrated patency of the right internal jugular vein, and this was documented with an image. Under real-time ultrasound guidance, this vein was accessed with a 21 gauge micropuncture needle and image documentation was performed. A small dermatotomy was made at the access site with an 11 scalpel. A 0.018" wire was advanced into the SVC and the access needle exchanged for a 36F micropuncture vascular sheath. The 0.018" wire was then removed and a 0.035" wire advanced into the IVC. A 9 French sheath was placed over  the wire, and a combination of the Bentson wire an angled catheter were used to select the hepatic veins. Position of the catheter was confirmed with small contrast injection. Once the 65 cm Kumpe the catheter was confirmed within distal hepatic veins, the Bentson wire was removed. Catheter was then positioned within the selected hepatic vein for venogram. Free hepatic vein pressure was achieved. The catheter was then wedged in the distal hepatic vein into the parenchyma, gentle contrast was injected confirming location, and a wedge pressure was achieved. Given that  this pressure appeared elevated, we then decided to make various measurements with a balloon occlusion catheter. The Bentson wire was advanced into the hepatic vein in the Kumpe the catheter was removed. An over the wire Fogarty balloon was advanced on the wire into the hepatic vein. Once the balloon Fogarty was within the hepatic vein the wire was removed. Contrast injected confirmed location within the hepatic vein. A free vein pressure was measured. The balloon was then inflated achieving stasis. Small contrast injection confirms stasis. Wedge pressure was then achieved. A series of 3 wedge pressure was then performed with slight variation in the position of the balloon occlusion catheter, each time confirming that the catheter was occlusive. Bentson wire was then advanced through the balloon catheter which was inflated, advancing the sheath into the hepatic vein. With the sheath in the hepatic vein, the balloon and wire were removed. The biopsy sheath/cannula was then advanced through the sheath into the selected hepatic veins. Once the cannula was positioned, 4 separate 20 gauge biopsy were achieved, placed into saline. Final injection was performed. Patient tolerated the procedure well and remained hemodynamically stable throughout. No complications were encountered and no significant blood loss was encountered. FINDINGS: Right atrium: 9/3 (6)  Hepatic Vein: 13/7 (10) Wedge Pressure: (29), (26), (28) Calculated portal venous pressure: Range is 16 - 19 mmHg IMPRESSION: Status post image guided access right internal jugular vein for hepatic venogram, pressure measurements, and medical liver biopsy. The series of hepatic vein pressure measurements confirms elevation of the portal pressures. Signed, Dulcy Fanny. Dellia Nims, RPVI [facility] Electronically Signed   By: Corrie Mckusick D.O.   On: 12/23/2020 14:06   Scheduled Meds: . clotrimazole  10 mg Oral 5 X Daily  . famotidine  20 mg Oral QHS  . feeding supplement  237 mL Oral TID BM  . FLUoxetine  80 mg Oral Daily  . insulin aspart  0-9 Units Subcutaneous TID WC  . multivitamin with minerals  1 tablet Oral Daily  . pantoprazole  40 mg Oral q morning   Continuous Infusions: . heparin 1,500 Units/hr (12/25/20 0656)    LOS: 15 days   Kerney Elbe, DO [facility] PAGER is on [place]  If 7PM-7AM, please contact night-coverage [email or web address]

## 2020-12-26 DIAGNOSIS — E669 Obesity, unspecified: Secondary | ICD-10-CM | POA: Diagnosis not present

## 2020-12-26 DIAGNOSIS — J90 Pleural effusion, not elsewhere classified: Secondary | ICD-10-CM | POA: Diagnosis not present

## 2020-12-26 DIAGNOSIS — R188 Other ascites: Secondary | ICD-10-CM | POA: Diagnosis not present

## 2020-12-26 DIAGNOSIS — K7460 Unspecified cirrhosis of liver: Secondary | ICD-10-CM | POA: Diagnosis not present

## 2020-12-26 DIAGNOSIS — I700 Atherosclerosis of aorta: Secondary | ICD-10-CM | POA: Diagnosis not present

## 2020-12-26 LAB — CBC WITH DIFFERENTIAL/PLATELET
Abs Immature Granulocytes: 0.04 10*3/uL (ref 0.00–0.07)
Basophils Absolute: 0.1 10*3/uL (ref 0.0–0.1)
Basophils Relative: 1 %
Eosinophils Absolute: 0.4 10*3/uL (ref 0.0–0.5)
Eosinophils Relative: 3 %
HCT: 26.4 % — ABNORMAL LOW (ref 36.0–46.0)
Hemoglobin: 9.7 g/dL — ABNORMAL LOW (ref 12.0–15.0)
Immature Granulocytes: 0 %
Lymphocytes Relative: 19 %
Lymphs Abs: 2.6 10*3/uL (ref 0.7–4.0)
MCH: 28.4 pg (ref 26.0–34.0)
MCHC: 36.7 g/dL — ABNORMAL HIGH (ref 30.0–36.0)
MCV: 77.4 fL — ABNORMAL LOW (ref 80.0–100.0)
Monocytes Absolute: 1.2 10*3/uL — ABNORMAL HIGH (ref 0.1–1.0)
Monocytes Relative: 9 %
Neutro Abs: 9.3 10*3/uL — ABNORMAL HIGH (ref 1.7–7.7)
Neutrophils Relative %: 68 %
Platelets: 184 10*3/uL (ref 150–400)
RBC: 3.41 MIL/uL — ABNORMAL LOW (ref 3.87–5.11)
RDW: 23.9 % — ABNORMAL HIGH (ref 11.5–15.5)
WBC: 13.5 10*3/uL — ABNORMAL HIGH (ref 4.0–10.5)
nRBC: 0 % (ref 0.0–0.2)

## 2020-12-26 LAB — GLUCOSE, CAPILLARY
Glucose-Capillary: 76 mg/dL (ref 70–99)
Glucose-Capillary: 130 mg/dL — ABNORMAL HIGH (ref 70–99)
Glucose-Capillary: 151 mg/dL — ABNORMAL HIGH (ref 70–99)
Glucose-Capillary: 198 mg/dL — ABNORMAL HIGH (ref 70–99)

## 2020-12-26 LAB — MAGNESIUM: Magnesium: 2.1 mg/dL (ref 1.7–2.4)

## 2020-12-26 LAB — COMPREHENSIVE METABOLIC PANEL
ALT: 54 U/L — ABNORMAL HIGH (ref 0–44)
AST: 216 U/L — ABNORMAL HIGH (ref 15–41)
Albumin: 2.2 g/dL — ABNORMAL LOW (ref 3.5–5.0)
Alkaline Phosphatase: 145 U/L — ABNORMAL HIGH (ref 38–126)
Anion gap: 9 (ref 5–15)
BUN: 24 mg/dL — ABNORMAL HIGH (ref 8–23)
CO2: 16 mmol/L — ABNORMAL LOW (ref 22–32)
Calcium: 8.5 mg/dL — ABNORMAL LOW (ref 8.9–10.3)
Chloride: 105 mmol/L (ref 98–111)
Creatinine, Ser: 1.54 mg/dL — ABNORMAL HIGH (ref 0.44–1.00)
GFR, Estimated: 37 mL/min — ABNORMAL LOW (ref >60)
Glucose, Bld: 80 mg/dL (ref 70–99)
Potassium: 4.4 mmol/L (ref 3.5–5.1)
Sodium: 130 mmol/L — ABNORMAL LOW (ref 135–145)
Total Bilirubin: 3.1 mg/dL — ABNORMAL HIGH (ref 0.3–1.2)
Total Protein: 5.4 g/dL — ABNORMAL LOW (ref 6.5–8.1)

## 2020-12-26 LAB — HEPARIN LEVEL (UNFRACTIONATED): Heparin Unfractionated: 0.52 IU/mL (ref 0.30–0.70)

## 2020-12-26 LAB — PHOSPHORUS: Phosphorus: 2.2 mg/dL — ABNORMAL LOW (ref 2.5–4.6)

## 2020-12-26 MED ORDER — OCTREOTIDE ACETATE 100 MCG/ML IJ SOLN
100.0000 ug | Freq: Three times a day (TID) | INTRAMUSCULAR | Status: DC
  Administered 2020-12-26 – 2020-12-29 (×8): 100 ug via SUBCUTANEOUS
  Filled 2020-12-26 (×9): qty 1

## 2020-12-26 MED ORDER — MIDODRINE HCL 5 MG PO TABS
10.0000 mg | ORAL_TABLET | Freq: Three times a day (TID) | ORAL | Status: DC
  Administered 2020-12-26 – 2020-12-27 (×3): 10 mg via ORAL
  Filled 2020-12-26 (×3): qty 2

## 2020-12-26 MED ORDER — ALBUMIN HUMAN 5 % IV SOLN
25.0000 g | Freq: Four times a day (QID) | INTRAVENOUS | Status: AC
  Administered 2020-12-26 – 2020-12-27 (×3): 25 g via INTRAVENOUS
  Administered 2020-12-27: 12.5 g via INTRAVENOUS
  Administered 2020-12-27 – 2020-12-28 (×4): 25 g via INTRAVENOUS
  Filled 2020-12-26 (×9): qty 500
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
## 2020-12-26 NOTE — Plan of Care (Signed)

## 2020-12-26 NOTE — Progress Notes (Signed)
ANTICOAGULATION CONSULT NOTE  Pharmacy Consult for Heparin Indication: pulmonary embolus  Labs: Recent Labs    12/24/20 0306 12/24/20 1157 12/25/20 0156 12/26/20 0211  HGB 10.4*  --  9.4* 9.7*  HCT 28.7*  --  26.8* 26.4*  PLT 210  --  207 184  HEPARINUNFRC 0.47 0.47 0.47 0.52  CREATININE 1.62*  --  1.45* 1.54*    Assessment: Patient with history of PE in 2019 on once-daily apixaban PTA (last dose on 1/27). Per Dr. Melvyn Novas 05/05/18: continue half dose for life, but see a hematologist to consider discontinuation in the future.  Pharmacy consulted to dose heparin infusion for history of PE.  Patient is s/p Pleurx cath placement, chest tube removal and liver biopsy on 12/23/20 and heparin resumed afterward.  Heparin level remains therapeutic, on 1500 units/hr. Hgb 9.7, plt 184. No s/sx of bleeding or infusion issues.  Goal of Therapy:  Heparin level 0.3-0.7 units/mL Monitor platelets by anticoagulation protocol: Yes   Plan:  Continue heparin infusion at 1500 units/hr Daily heparin level and CBC  Antonietta Jewel, PharmD, [place] Pharmacist  Phone: [phone] 12/26/2020 8:17 AM  Please check AMION for all [place] phone numbers After 10:00 PM, call [place] [phone]
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
## 2020-12-27 DIAGNOSIS — I700 Atherosclerosis of aorta: Secondary | ICD-10-CM | POA: Diagnosis not present

## 2020-12-27 DIAGNOSIS — K7460 Unspecified cirrhosis of liver: Secondary | ICD-10-CM | POA: Diagnosis not present

## 2020-12-27 DIAGNOSIS — J90 Pleural effusion, not elsewhere classified: Secondary | ICD-10-CM | POA: Diagnosis not present

## 2020-12-27 DIAGNOSIS — E669 Obesity, unspecified: Secondary | ICD-10-CM | POA: Diagnosis not present

## 2020-12-27 DIAGNOSIS — R188 Other ascites: Secondary | ICD-10-CM | POA: Diagnosis not present

## 2020-12-27 LAB — HEPARIN LEVEL (UNFRACTIONATED): Heparin Unfractionated: 0.57 IU/mL (ref 0.30–0.70)

## 2020-12-27 LAB — CBC WITH DIFFERENTIAL/PLATELET
Abs Immature Granulocytes: 0.04 10*3/uL (ref 0.00–0.07)
Basophils Absolute: 0.1 10*3/uL (ref 0.0–0.1)
Basophils Relative: 1 %
Eosinophils Absolute: 0.6 10*3/uL — ABNORMAL HIGH (ref 0.0–0.5)
Eosinophils Relative: 4 %
HCT: 26 % — ABNORMAL LOW (ref 36.0–46.0)
Hemoglobin: 9.4 g/dL — ABNORMAL LOW (ref 12.0–15.0)
Immature Granulocytes: 0 %
Lymphocytes Relative: 21 %
Lymphs Abs: 2.9 10*3/uL (ref 0.7–4.0)
MCH: 28.3 pg (ref 26.0–34.0)
MCHC: 36.2 g/dL — ABNORMAL HIGH (ref 30.0–36.0)
MCV: 78.3 fL — ABNORMAL LOW (ref 80.0–100.0)
Monocytes Absolute: 1.4 10*3/uL — ABNORMAL HIGH (ref 0.1–1.0)
Monocytes Relative: 10 %
Neutro Abs: 8.8 10*3/uL — ABNORMAL HIGH (ref 1.7–7.7)
Neutrophils Relative %: 64 %
Platelets: 188 10*3/uL (ref 150–400)
RBC: 3.32 MIL/uL — ABNORMAL LOW (ref 3.87–5.11)
RDW: 24.6 % — ABNORMAL HIGH (ref 11.5–15.5)
WBC: 13.7 10*3/uL — ABNORMAL HIGH (ref 4.0–10.5)
nRBC: 0 % (ref 0.0–0.2)

## 2020-12-27 LAB — GLUCOSE, CAPILLARY
Glucose-Capillary: 111 mg/dL — ABNORMAL HIGH (ref 70–99)
Glucose-Capillary: 152 mg/dL — ABNORMAL HIGH (ref 70–99)
Glucose-Capillary: 150 mg/dL — ABNORMAL HIGH (ref 70–99)
Glucose-Capillary: 177 mg/dL — ABNORMAL HIGH (ref 70–99)

## 2020-12-27 LAB — COMPREHENSIVE METABOLIC PANEL
ALT: 51 U/L — ABNORMAL HIGH (ref 0–44)
AST: 196 U/L — ABNORMAL HIGH (ref 15–41)
Albumin: 2.6 g/dL — ABNORMAL LOW (ref 3.5–5.0)
Alkaline Phosphatase: 129 U/L — ABNORMAL HIGH (ref 38–126)
Anion gap: 7 (ref 5–15)
BUN: 23 mg/dL (ref 8–23)
CO2: 18 mmol/L — ABNORMAL LOW (ref 22–32)
Calcium: 8.6 mg/dL — ABNORMAL LOW (ref 8.9–10.3)
Chloride: 106 mmol/L (ref 98–111)
Creatinine, Ser: 1.48 mg/dL — ABNORMAL HIGH (ref 0.44–1.00)
GFR, Estimated: 39 mL/min — ABNORMAL LOW (ref >60)
Glucose, Bld: 133 mg/dL — ABNORMAL HIGH (ref 70–99)
Potassium: 4.6 mmol/L (ref 3.5–5.1)
Sodium: 131 mmol/L — ABNORMAL LOW (ref 135–145)
Total Bilirubin: 3.2 mg/dL — ABNORMAL HIGH (ref 0.3–1.2)
Total Protein: 5.8 g/dL — ABNORMAL LOW (ref 6.5–8.1)

## 2020-12-27 LAB — PHOSPHORUS: Phosphorus: 2.7 mg/dL (ref 2.5–4.6)

## 2020-12-27 LAB — MAGNESIUM: Magnesium: 2.1 mg/dL (ref 1.7–2.4)

## 2020-12-27 MED ORDER — MIDODRINE HCL 5 MG PO TABS
15.0000 mg | ORAL_TABLET | Freq: Three times a day (TID) | ORAL | Status: DC
  Administered 2020-12-27 – 2021-01-10 (×36): 15 mg via ORAL
  Filled 2020-12-27 (×40): qty 3

## 2020-12-27 MED ORDER — ALBUTEROL SULFATE HFA 108 (90 BASE) MCG/ACT IN AERS
2.0000 | INHALATION_SPRAY | RESPIRATORY_TRACT | Status: DC | PRN
  Administered 2020-12-30: 2 via RESPIRATORY_TRACT

## 2020-12-27 MED ORDER — ALBUTEROL SULFATE (2.5 MG/3ML) 0.083% IN NEBU: 2.5000 mg | INHALATION_SOLUTION | RESPIRATORY_TRACT | Status: DC | PRN

## 2020-12-27 NOTE — Progress Notes (Signed)
ANTICOAGULATION CONSULT NOTE  Pharmacy Consult for Heparin Indication: pulmonary embolus  Labs: Recent Labs    12/25/20 0156 12/26/20 0211 12/27/20 0227  HGB 9.4* 9.7* 9.4*  HCT 26.8* 26.4* 26.0*  PLT 207 184 188  HEPARINUNFRC 0.47 0.52 0.57  CREATININE 1.45* 1.54* 1.48*    Assessment: Patient with history of PE in 2019 on once-daily apixaban PTA (last dose on 1/27). Per Dr. Melvyn Novas 05/05/18: continue half dose for life, but see a hematologist to consider discontinuation in the future.  Pharmacy consulted to dose heparin infusion for history of PE.  Patient is s/p Pleurx cath placement, chest tube removal and liver biopsy on 12/23/20 and heparin resumed afterward.  Heparin level remains therapeutic, on 1500 units/hr. Hgb and platelets remain stable. No s/sx of bleeding or infusion issues.  Goal of Therapy:  Heparin level 0.3-0.7 units/mL Monitor platelets by anticoagulation protocol: Yes   Plan:  Continue heparin infusion at 1500 units/hr Daily heparin level and CBC  Sloan Leiter, PharmD, BCPS, BCCCP Clinical Pharmacist Please refer to [facility] for [place] numbers 12/27/2020 8:29 AM  Please check AMION for all [place] phone numbers After 10:00 PM, call [place] [phone]
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
## 2020-12-27 NOTE — TOC Progression Note (Signed)
Transition of Care [facility]) - Progression Note    Patient Details  Name: CHARDONAE OLLER MRN: [id number] Date of Birth: [birth year]  Transition of [facility]) CM/SW Contact  Joanne Chars, LCSW Phone Number: 12/27/2020, 9:56 AM  Clinical Narrative:  CSW spoke with Esmond Camper, [facility] regarding this pt.  She can accept this pt, however [facility] does not work with Pleurx drains.  If that is still in place at DC will need to find another provider.        Barriers to Discharge: Continued Medical Work up  Expected Discharge Plan and Services           Expected Discharge Date: 12/12/20                         HH Arranged: PT [place]: [place] Date [place]: 12/25/20 Time [place]: 1310 Representative spoke with at [place]: [place] ([place]) Interventions    Readmission Risk Interventions Readmission Risk Prevention Plan 12/12/2020  Transportation Screening Complete  PCP or Specialist Appt within 3-5 Days Complete  HRI or [place] (No Data)  Social Work Consult for [place] Planning/Counseling Complete  Palliative Care Screening Not Applicable  Some recent data might be hidden
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
## 2020-12-28 DIAGNOSIS — K7581 Nonalcoholic steatohepatitis (NASH): Secondary | ICD-10-CM

## 2020-12-28 DIAGNOSIS — J90 Pleural effusion, not elsewhere classified: Secondary | ICD-10-CM | POA: Diagnosis not present

## 2020-12-28 DIAGNOSIS — I700 Atherosclerosis of aorta: Secondary | ICD-10-CM | POA: Diagnosis not present

## 2020-12-28 DIAGNOSIS — E669 Obesity, unspecified: Secondary | ICD-10-CM | POA: Diagnosis not present

## 2020-12-28 DIAGNOSIS — K7460 Unspecified cirrhosis of liver: Secondary | ICD-10-CM | POA: Diagnosis not present

## 2020-12-28 LAB — GLUCOSE, CAPILLARY
Glucose-Capillary: 105 mg/dL — ABNORMAL HIGH (ref 70–99)
Glucose-Capillary: 111 mg/dL — ABNORMAL HIGH (ref 70–99)
Glucose-Capillary: 121 mg/dL — ABNORMAL HIGH (ref 70–99)
Glucose-Capillary: 123 mg/dL — ABNORMAL HIGH (ref 70–99)
Glucose-Capillary: 129 mg/dL — ABNORMAL HIGH (ref 70–99)
Glucose-Capillary: 99 mg/dL (ref 70–99)

## 2020-12-28 LAB — RETICULOCYTES
Immature Retic Fract: 20.4 % — ABNORMAL HIGH (ref 2.3–15.9)
RBC.: 3.15 MIL/uL — ABNORMAL LOW (ref 3.87–5.11)
Retic Count, Absolute: 95.8 10*3/uL (ref 19.0–186.0)
Retic Ct Pct: 3 % (ref 0.4–3.1)

## 2020-12-28 LAB — CBC WITH DIFFERENTIAL/PLATELET
Abs Immature Granulocytes: 0.05 10*3/uL (ref 0.00–0.07)
Basophils Absolute: 0.1 10*3/uL (ref 0.0–0.1)
Basophils Relative: 1 %
Eosinophils Absolute: 0.9 10*3/uL — ABNORMAL HIGH (ref 0.0–0.5)
Eosinophils Relative: 6 %
HCT: 25.4 % — ABNORMAL LOW (ref 36.0–46.0)
Hemoglobin: 9 g/dL — ABNORMAL LOW (ref 12.0–15.0)
Immature Granulocytes: 0 %
Lymphocytes Relative: 21 %
Lymphs Abs: 3.1 10*3/uL (ref 0.7–4.0)
MCH: 29.1 pg (ref 26.0–34.0)
MCHC: 35.4 g/dL (ref 30.0–36.0)
MCV: 82.2 fL (ref 80.0–100.0)
Monocytes Absolute: 1.1 10*3/uL — ABNORMAL HIGH (ref 0.1–1.0)
Monocytes Relative: 7 %
Neutro Abs: 9.8 10*3/uL — ABNORMAL HIGH (ref 1.7–7.7)
Neutrophils Relative %: 65 %
Platelets: 187 10*3/uL (ref 150–400)
RBC: 3.09 MIL/uL — ABNORMAL LOW (ref 3.87–5.11)
RDW: 26.2 % — ABNORMAL HIGH (ref 11.5–15.5)
WBC: 15 10*3/uL — ABNORMAL HIGH (ref 4.0–10.5)
nRBC: 0 % (ref 0.0–0.2)

## 2020-12-28 LAB — MAGNESIUM: Magnesium: 2 mg/dL (ref 1.7–2.4)

## 2020-12-28 LAB — COMPREHENSIVE METABOLIC PANEL
ALT: 45 U/L — ABNORMAL HIGH (ref 0–44)
AST: 194 U/L — ABNORMAL HIGH (ref 15–41)
Albumin: 3.3 g/dL — ABNORMAL LOW (ref 3.5–5.0)
Alkaline Phosphatase: 102 U/L (ref 38–126)
Anion gap: 11 (ref 5–15)
BUN: 20 mg/dL (ref 8–23)
CO2: 15 mmol/L — ABNORMAL LOW (ref 22–32)
Calcium: 8.9 mg/dL (ref 8.9–10.3)
Chloride: 107 mmol/L (ref 98–111)
Creatinine, Ser: 1.36 mg/dL — ABNORMAL HIGH (ref 0.44–1.00)
GFR, Estimated: 43 mL/min — ABNORMAL LOW (ref >60)
Glucose, Bld: 100 mg/dL — ABNORMAL HIGH (ref 70–99)
Potassium: 4.7 mmol/L (ref 3.5–5.1)
Sodium: 133 mmol/L — ABNORMAL LOW (ref 135–145)
Total Bilirubin: 3.2 mg/dL — ABNORMAL HIGH (ref 0.3–1.2)
Total Protein: 5.6 g/dL — ABNORMAL LOW (ref 6.5–8.1)

## 2020-12-28 LAB — IRON AND TIBC
Iron: 38 ug/dL (ref 28–170)
Saturation Ratios: 34 % — ABNORMAL HIGH (ref 10.4–31.8)
TIBC: 111 ug/dL — ABNORMAL LOW (ref 250–450)
UIBC: 73 ug/dL

## 2020-12-28 LAB — HEPARIN LEVEL (UNFRACTIONATED)
Heparin Unfractionated: 0.29 IU/mL — ABNORMAL LOW (ref 0.30–0.70)
Heparin Unfractionated: 0.3 IU/mL (ref 0.30–0.70)

## 2020-12-28 LAB — FOLATE: Folate: 8.7 ng/mL (ref >5.9)

## 2020-12-28 LAB — FERRITIN: Ferritin: 44 ng/mL (ref 11–307)

## 2020-12-28 LAB — PHOSPHORUS: Phosphorus: 2.1 mg/dL — ABNORMAL LOW (ref 2.5–4.6)

## 2020-12-28 LAB — PROTIME-INR
INR: 2.1 — ABNORMAL HIGH (ref 0.8–1.2)
Prothrombin Time: 23.2 seconds — ABNORMAL HIGH (ref 11.4–15.2)

## 2020-12-28 LAB — VITAMIN B12: Vitamin B-12: 1016 pg/mL — ABNORMAL HIGH (ref 180–914)

## 2020-12-28 MED ORDER — SODIUM BICARBONATE 650 MG PO TABS
1300.0000 mg | ORAL_TABLET | Freq: Two times a day (BID) | ORAL | Status: DC
  Administered 2020-12-28 – 2021-01-10 (×23): 1300 mg via ORAL
  Filled 2020-12-28 (×28): qty 2

## 2020-12-28 MED ORDER — PHYTONADIONE 5 MG PO TABS
10.0000 mg | ORAL_TABLET | Freq: Once | ORAL | Status: AC
  Administered 2020-12-28: 10 mg via ORAL
  Filled 2020-12-28: qty 2
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
## 2020-12-28 NOTE — Progress Notes (Addendum)
[facility] NEPHROLOGY PROGRESS NOTE  Assessment/ Plan: Pt is a 65 y.o. 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  She is in need of TIPS procedure for definitive treatment.  We are consulted for acute kidney injury.    # Acute kidney injury most likely due to hepatorenal syndrome, did not improve with fluid resuscitation.  The volume status looks acceptable. Urine Na<10.  Urine output is still not documented, patient reports good urine output.  She looks euvolemic on exam.  Creatinine level trending down.  We will continue current treatment including midodrine, albumin and octreotide.  No need for diuretics.  Recommend a strict ins and out, daily lab.  #Hypotension:  Variable BP with episode of hypotension.  Overall improved with albumin and midodrine.  #NASH cirrhosis with hepatic hydrothorax: Plan for TIPS procedure when patient is stable.  Currently managed by GI and primary team.  #Hyponatremia: Sodium level improving.  #Metabolic acidosis complicated due to liver disease:  starting sodium bicarbonate.  Subjective: Seen and examined at bedside.  Patient reported going to the bathroom for urination however not recorded.  She denies nausea vomiting chest pain or shortness of breath. Objective Vital signs in last 24 hours: Vitals:   12/27/20 1136 12/27/20 1158 12/27/20 2136 12/28/20 0639  BP:  (!) 95/39 (!) 110/45 (!) 96/46  Pulse: 94 74 75 79  Resp:  '19 16 16  '$ Temp:  97.8 F (36.6 C) 98.3 F (36.8 C) 98.2 F (36.8 C)  TempSrc:  Oral    SpO2: 90% 100% 94% 95%  Weight:      Height:       Weight change:   Intake/Output Summary (Last 24 hours) at 12/28/2020 0832 Last data filed at 12/27/2020 1600 Gross per 24 hour  Intake --  Output 450 ml  Net -450 ml       Labs: Basic Metabolic Panel: Recent Labs  Lab 12/26/20 0211 12/27/20 0227 12/28/20 0338  NA 130* 131* 133*  K 4.4 4.6 4.7  CL 105 106  107  CO2 16* 18* 15*  GLUCOSE 80 133* 100*  BUN 24* 23 20  CREATININE 1.54* 1.48* 1.36*  CALCIUM 8.5* 8.6* 8.9  PHOS 2.2* 2.7 2.1*   Liver Function Tests: Recent Labs  Lab 12/26/20 0211 12/27/20 0227 12/28/20 0338  AST 216* 196* 194*  ALT 54* 51* 45*  ALKPHOS 145* 129* 102  BILITOT 3.1* 3.2* 3.2*  PROT 5.4* 5.8* 5.6*  ALBUMIN 2.2* 2.6* 3.3*   No results for input(s): LIPASE, AMYLASE in the last 168 hours. No results for input(s): AMMONIA in the last 168 hours. CBC: Recent Labs  Lab 12/24/20 0306 12/25/20 0156 12/26/20 0211 12/27/20 0227 12/28/20 0338  WBC 13.1* 13.1* 13.5* 13.7* 15.0*  NEUTROABS 8.8* 9.1* 9.3* 8.8* 9.8*  HGB 10.4* 9.4* 9.7* 9.4* 9.0*  HCT 28.7* 26.8* 26.4* 26.0* 25.4*  MCV 75.7* 77.5* 77.4* 78.3* 82.2  PLT 210 207 184 188 187   Cardiac Enzymes: No results for input(s): CKTOTAL, CKMB, CKMBINDEX, TROPONINI in the last 168 hours. CBG: Recent Labs  Lab 12/27/20 1541 12/27/20 2015 12/28/20 0014 12/28/20 0425 12/28/20 0734  GLUCAP 150* 177* 123* 105* 111*    Iron Studies:  Recent Labs    12/28/20 0338  IRON 38  TIBC 111*  FERRITIN 44   Studies/Results: No results found.  Medications: Infusions: . albumin human 25 g (12/28/20 0410)  . heparin 1,600 Units/hr (12/28/20 0720)    Scheduled Medications: .  clotrimazole  10 mg Oral 5 X Daily  . famotidine  20 mg Oral QHS  . feeding supplement  237 mL Oral TID BM  . FLUoxetine  80 mg Oral Daily  . insulin aspart  0-9 Units Subcutaneous TID WC  . midodrine  15 mg Oral TID WC  . multivitamin with minerals  1 tablet Oral Daily  . octreotide  100 mcg Subcutaneous TID  . pantoprazole  40 mg Oral q morning    have reviewed scheduled and prn medications.  Physical Exam: General:NAD, comfortable Heart:RRR, s1s2 nl Lungs:clear b/l, no crackle Abdomen:soft, Non-tender, non-distended Extremities:No edema Neurology: Alert awake and following commands.  Keryn Nessler Reesa Chew [place] 12/28/2020,8:32  AM  LOS: 18 days

## 2020-12-28 NOTE — Progress Notes (Addendum)
ANTICOAGULATION CONSULT NOTE  Addendum: Recheck heparin level at lower end of goal at 0.30 will increase heparin slightly to 1650 units/hour and follow levels with AM labs.   Pharmacy Consult for Heparin Indication: pulmonary embolus  Labs: Recent Labs    12/26/20 0211 12/27/20 0227 12/28/20 0338 12/28/20 0607 12/28/20 0608  HGB 9.7* 9.4* 9.0*  --   --   HCT 26.4* 26.0* 25.4*  --   --   PLT 184 188 187  --   --   LABPROT  --   --   --  23.2*  --   INR  --   --   --  2.1*  --   HEPARINUNFRC 0.52 0.57  --   --  0.29*  CREATININE 1.54* 1.48* 1.36*  --   --     Assessment: Patient with history of PE in 2019 on once-daily apixaban PTA (last dose on 1/27). Per Dr. Melvyn Novas 05/05/18: continue half dose for life, but see a hematologist to consider discontinuation in the future.  Pharmacy consulted to dose heparin infusion for history of PE.  Patient is s/p Pleurx cath placement, chest tube removal and liver biopsy on 12/23/20 and heparin resumed afterward.  Heparin level slightly subtherapeutic at 0.29, on 1500 units/hr. Hgb and platelets remain stable. No s/sx of bleeding or infusion issues. Lab seems to be drawn correctly, renal function improved could affect levels.   Goal of Therapy:  Heparin level 0.3-0.7 units/mL Monitor platelets by anticoagulation protocol: Yes   Plan:  Increase heparin infusion to 1600 units/hr 6-hr HL schedule for 1330 Monitor s/sx of bleed Daily heparin level and [place], PharmD, [place] Resident [phone] 12/28/2020 7:17 AM  Please check AMION for all [place] phone numbers After 10:00 PM, call [place] [phone]

## 2020-12-29 DIAGNOSIS — J90 Pleural effusion, not elsewhere classified: Secondary | ICD-10-CM | POA: Diagnosis not present

## 2020-12-29 DIAGNOSIS — K7460 Unspecified cirrhosis of liver: Secondary | ICD-10-CM | POA: Diagnosis not present

## 2020-12-29 DIAGNOSIS — I700 Atherosclerosis of aorta: Secondary | ICD-10-CM | POA: Diagnosis not present

## 2020-12-29 DIAGNOSIS — E669 Obesity, unspecified: Secondary | ICD-10-CM | POA: Diagnosis not present

## 2020-12-29 LAB — PHOSPHORUS: Phosphorus: 2 mg/dL — ABNORMAL LOW (ref 2.5–4.6)

## 2020-12-29 LAB — COMPREHENSIVE METABOLIC PANEL
ALT: 48 U/L — ABNORMAL HIGH (ref 0–44)
AST: 182 U/L — ABNORMAL HIGH (ref 15–41)
Albumin: 3.1 g/dL — ABNORMAL LOW (ref 3.5–5.0)
Alkaline Phosphatase: 107 U/L (ref 38–126)
Anion gap: 7 (ref 5–15)
BUN: 16 mg/dL (ref 8–23)
CO2: 17 mmol/L — ABNORMAL LOW (ref 22–32)
Calcium: 9.1 mg/dL (ref 8.9–10.3)
Chloride: 111 mmol/L (ref 98–111)
Creatinine, Ser: 1.36 mg/dL — ABNORMAL HIGH (ref 0.44–1.00)
GFR, Estimated: 43 mL/min — ABNORMAL LOW (ref >60)
Glucose, Bld: 141 mg/dL — ABNORMAL HIGH (ref 70–99)
Potassium: 4.2 mmol/L (ref 3.5–5.1)
Sodium: 135 mmol/L (ref 135–145)
Total Bilirubin: 3.2 mg/dL — ABNORMAL HIGH (ref 0.3–1.2)
Total Protein: 5.4 g/dL — ABNORMAL LOW (ref 6.5–8.1)

## 2020-12-29 LAB — CBC WITH DIFFERENTIAL/PLATELET
Abs Immature Granulocytes: 0.05 10*3/uL (ref 0.00–0.07)
Basophils Absolute: 0.1 10*3/uL (ref 0.0–0.1)
Basophils Relative: 1 %
Eosinophils Absolute: 0.4 10*3/uL (ref 0.0–0.5)
Eosinophils Relative: 3 %
HCT: 25.9 % — ABNORMAL LOW (ref 36.0–46.0)
Hemoglobin: 8.9 g/dL — ABNORMAL LOW (ref 12.0–15.0)
Immature Granulocytes: 0 %
Lymphocytes Relative: 21 %
Lymphs Abs: 2.7 10*3/uL (ref 0.7–4.0)
MCH: 28.1 pg (ref 26.0–34.0)
MCHC: 34.4 g/dL (ref 30.0–36.0)
MCV: 81.7 fL (ref 80.0–100.0)
Monocytes Absolute: 1.1 10*3/uL — ABNORMAL HIGH (ref 0.1–1.0)
Monocytes Relative: 9 %
Neutro Abs: 8.6 10*3/uL — ABNORMAL HIGH (ref 1.7–7.7)
Neutrophils Relative %: 66 %
Platelets: 173 10*3/uL (ref 150–400)
RBC: 3.17 MIL/uL — ABNORMAL LOW (ref 3.87–5.11)
RDW: 26.5 % — ABNORMAL HIGH (ref 11.5–15.5)
WBC: 13 10*3/uL — ABNORMAL HIGH (ref 4.0–10.5)
nRBC: 0 % (ref 0.0–0.2)

## 2020-12-29 LAB — PROTIME-INR
INR: 2.3 — ABNORMAL HIGH (ref 0.8–1.2)
Prothrombin Time: 24.1 seconds — ABNORMAL HIGH (ref 11.4–15.2)

## 2020-12-29 LAB — GLUCOSE, CAPILLARY
Glucose-Capillary: 123 mg/dL — ABNORMAL HIGH (ref 70–99)
Glucose-Capillary: 112 mg/dL — ABNORMAL HIGH (ref 70–99)
Glucose-Capillary: 102 mg/dL — ABNORMAL HIGH (ref 70–99)

## 2020-12-29 LAB — HEPARIN LEVEL (UNFRACTIONATED): Heparin Unfractionated: 0.33 IU/mL (ref 0.30–0.70)

## 2020-12-29 LAB — AFP TUMOR MARKER: AFP, Serum, Tumor Marker: 2.6 ng/mL (ref 0.0–8.3)

## 2020-12-29 LAB — MAGNESIUM: Magnesium: 2 mg/dL (ref 1.7–2.4)

## 2020-12-29 MED ORDER — K PHOS MONO-SOD PHOS DI & MONO 155-852-130 MG PO TABS
500.0000 mg | ORAL_TABLET | Freq: Two times a day (BID) | ORAL | Status: AC
  Administered 2020-12-29 (×2): 500 mg via ORAL
  Filled 2020-12-29 (×2): qty 2

## 2020-12-29 MED ORDER — PHYTONADIONE 5 MG PO TABS
10.0000 mg | ORAL_TABLET | Freq: Once | ORAL | Status: AC
  Administered 2020-12-29: 10 mg via ORAL
  Filled 2020-12-29: qty 2

## 2020-12-29 NOTE — Progress Notes (Addendum)
Lori Cooper  Assessment/ Plan: Lori Cooper is a 65 y.o. 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  Lori Cooper is in need of TIPS procedure for definitive treatment.  We are consulted for acute kidney injury.    # Acute kidney injury most likely due to hepatorenal syndrome, did not improve with fluid resuscitation.  The volume status looks acceptable. Urine Na<10.  No urine output recorded.  Patient's nurse reports that Lori Cooper has been urinating but it has been mixed with stool so unable to measure.  Drain output of 1000 mL  Creatinine stable 1.36 today. Lori Cooper completed IV albumin. We will discontinue octreotide today and continue midodrine.  Recommend daily lab, strict ins and out.  Likely needs oral loop diuretics if kidney function remains stable. Encourage strict I's and O's.  #Hypotension:  Variable BP with episode of hypotension.  Overall improved with albumin and midodrine.  #NASH cirrhosis with hepatic hydrothorax: Plan for TIPS procedure when patient is stable.  Currently managed by GI and primary team.  #Hyponatremia: Sodium level improved  #Metabolic acidosis complicated due to liver disease:  started sodium bicarbonate.  Subjective: Patient feels Lori Cooper is doing well and is eager to go home.  Acknowledges that Lori Cooper has minor swelling in her lower extremities bilaterally. Objective Vital signs in last 24 hours: Vitals:   12/27/20 2136 12/28/20 0639 12/28/20 1142 12/28/20 2203  BP: (!) 110/45 (!) 96/46 (!) 99/53 (!) 105/47  Pulse: 75 79 75 78  Resp: '16 16 20 18  '$ Temp: 98.3 F (36.8 C) 98.2 F (36.8 C) 98.6 F (37 C) 98.6 F (37 C)  TempSrc:      SpO2: 94% 95% 97% 96%  Weight:      Height:       Weight change:   Intake/Output Summary (Last 24 hours) at 12/29/2020 0655 Last data filed at 12/28/2020 1900 Gross per 24 hour  Intake --  Output 1000 ml  Net -1000 ml       Labs: Basic Metabolic  Panel: Recent Labs  Lab 12/27/20 0227 12/28/20 0338 12/29/20 0240  NA 131* 133* 135  K 4.6 4.7 4.2  CL 106 107 111  CO2 18* 15* 17*  GLUCOSE 133* 100* 141*  BUN '23 20 16  '$ CREATININE 1.48* 1.36* 1.36*  CALCIUM 8.6* 8.9 9.1  PHOS 2.7 2.1* 2.0*   Liver Function Tests: Recent Labs  Lab 12/27/20 0227 12/28/20 0338 12/29/20 0240  AST 196* 194* 182*  ALT 51* 45* 48*  ALKPHOS 129* 102 107  BILITOT 3.2* 3.2* 3.2*  PROT 5.8* 5.6* 5.4*  ALBUMIN 2.6* 3.3* 3.1*   No results for input(s): LIPASE, AMYLASE in the last 168 hours. No results for input(s): AMMONIA in the last 168 hours. CBC: Recent Labs  Lab 12/25/20 0156 12/26/20 0211 12/27/20 0227 12/28/20 0338 12/29/20 0240  WBC 13.1* 13.5* 13.7* 15.0* 13.0*  NEUTROABS 9.1* 9.3* 8.8* 9.8* 8.6*  HGB 9.4* 9.7* 9.4* 9.0* 8.9*  HCT 26.8* 26.4* 26.0* 25.4* 25.9*  MCV 77.5* 77.4* 78.3* 82.2 81.7  PLT 207 184 188 187 173   Cardiac Enzymes: No results for input(s): CKTOTAL, CKMB, CKMBINDEX, TROPONINI in the last 168 hours. CBG: Recent Labs  Lab 12/28/20 0425 12/28/20 0734 12/28/20 1140 12/28/20 1612 12/28/20 2025  GLUCAP 105* 111* 121* 129* 99    Iron Studies:  Recent Labs    12/28/20 0338  IRON 38  TIBC 111*  FERRITIN 44   Studies/Results: No  results found.  Medications: Infusions: . heparin 1,650 Units/hr (12/28/20 1503)    Scheduled Medications: . clotrimazole  10 mg Oral 5 X Daily  . famotidine  20 mg Oral QHS  . feeding supplement  237 mL Oral TID BM  . FLUoxetine  80 mg Oral Daily  . insulin aspart  0-9 Units Subcutaneous TID WC  . midodrine  15 mg Oral TID WC  . multivitamin with minerals  1 tablet Oral Daily  . octreotide  100 mcg Subcutaneous TID  . pantoprazole  40 mg Oral q morning  . sodium bicarbonate  1,300 mg Oral BID    have reviewed scheduled and prn medications.  Physical Exam: General: Resting comfortably in bed, no acute distress Heart: Regular rate and rhythm, no murmurs  appreciated Lungs:clear b/l, no crackle Abdomen:soft, Non-tender, non-distended Extremities: +1 edema in lower extremities bilaterally Neurology: Alert awake and following commands.  Gifford Shave 12/29/2020,6:55 AM  LOS: 19 days   Nephrology attending: Patient was seen and examined.  Chart reviewed.  I agree with assessment and plan as outlined above.    AKI thought to be hepatorenal syndrome.  Started albumin, midodrine and octreotide with improvement of serum creatinine level.  The creatinine level is stable at 1.36 today.  Still unable to record urine output-reports good urine output by the patient and nursing staff.  Developed some trace lower extremity edema.  Lori Cooper completed IV albumin.  Discontinue octreotide today however we will continue midodrine for hypotension.  Lori Cooper will probably need oral Lasix in next 1-2 days.  Continue sodium bicarbonate for metabolic acidosis. Expect further renal recovery. Dail lab and strict I/o.  I have discussed with patient's daughter over the phone as well.  Katheran James, MD [facility].

## 2020-12-29 NOTE — Progress Notes (Addendum)
[facility] for Heparin Indication: pulmonary embolus  Labs: Recent Labs    12/27/20 0227 12/28/20 0338 12/28/20 0607 12/28/20 0608 12/28/20 1305 12/29/20 0240  HGB 9.4* 9.0*  --   --   --  8.9*  HCT 26.0* 25.4*  --   --   --  25.9*  PLT 188 187  --   --   --  173  LABPROT  --   --  23.2*  --   --  24.1*  INR  --   --  2.1*  --   --  2.3*  HEPARINUNFRC 0.57  --   --  0.29* 0.30 0.33  CREATININE 1.48* 1.36*  --   --   --  1.36*    Assessment: Patient with history of PE in 2019 on once-daily apixaban PTA (last dose on 1/27). Per Dr. Melvyn Novas 05/05/18: continue half dose for life, but see a hematologist to consider discontinuation in the future.  Pharmacy consulted to dose heparin infusion for history of PE.  Patient is s/p Pleurx cath placement, chest tube removal and liver biopsy on 12/23/20 and heparin resumed afterward. Heparin level therapeutic at 0.33. Hgb trending down slightly and platelets remain stable. No s/sx of bleeding or infusion issues.   Goal of Therapy:  Heparin level 0.3-0.7 units/mL Monitor platelets by anticoagulation protocol: Yes   Plan:  Continue herpain at 1650 units/hr Monitor s/sx of bleed Daily heparin level and [place], PharmD, [place] Resident [phone] 12/29/2020 7:39 AM
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
## 2020-12-30 ENCOUNTER — Encounter: Payer: Self-pay | Admitting: Gastroenterology

## 2020-12-30 DIAGNOSIS — E669 Obesity, unspecified: Secondary | ICD-10-CM | POA: Diagnosis not present

## 2020-12-30 DIAGNOSIS — K921 Melena: Secondary | ICD-10-CM

## 2020-12-30 DIAGNOSIS — K7460 Unspecified cirrhosis of liver: Secondary | ICD-10-CM | POA: Diagnosis not present

## 2020-12-30 DIAGNOSIS — J90 Pleural effusion, not elsewhere classified: Secondary | ICD-10-CM | POA: Diagnosis not present

## 2020-12-30 DIAGNOSIS — I700 Atherosclerosis of aorta: Secondary | ICD-10-CM | POA: Diagnosis not present

## 2020-12-30 DIAGNOSIS — R7989 Other specified abnormal findings of blood chemistry: Secondary | ICD-10-CM | POA: Diagnosis not present

## 2020-12-30 DIAGNOSIS — R188 Other ascites: Secondary | ICD-10-CM | POA: Diagnosis not present

## 2020-12-30 DIAGNOSIS — D689 Coagulation defect, unspecified: Secondary | ICD-10-CM

## 2020-12-30 LAB — CBC WITH DIFFERENTIAL/PLATELET
Abs Immature Granulocytes: 0.04 10*3/uL (ref 0.00–0.07)
Basophils Absolute: 0.1 10*3/uL (ref 0.0–0.1)
Basophils Relative: 0 %
Eosinophils Absolute: 0.3 10*3/uL (ref 0.0–0.5)
Eosinophils Relative: 2 %
HCT: 25.4 % — ABNORMAL LOW (ref 36.0–46.0)
Hemoglobin: 8.9 g/dL — ABNORMAL LOW (ref 12.0–15.0)
Immature Granulocytes: 0 %
Lymphocytes Relative: 13 %
Lymphs Abs: 1.8 10*3/uL (ref 0.7–4.0)
MCH: 28.3 pg (ref 26.0–34.0)
MCHC: 35 g/dL (ref 30.0–36.0)
MCV: 80.9 fL (ref 80.0–100.0)
Monocytes Absolute: 0.9 10*3/uL (ref 0.1–1.0)
Monocytes Relative: 7 %
Neutro Abs: 10.9 10*3/uL — ABNORMAL HIGH (ref 1.7–7.7)
Neutrophils Relative %: 78 %
Platelets: 180 10*3/uL (ref 150–400)
RBC: 3.14 MIL/uL — ABNORMAL LOW (ref 3.87–5.11)
RDW: 26.8 % — ABNORMAL HIGH (ref 11.5–15.5)
WBC: 14 10*3/uL — ABNORMAL HIGH (ref 4.0–10.5)
nRBC: 0 % (ref 0.0–0.2)

## 2020-12-30 LAB — COMPREHENSIVE METABOLIC PANEL
ALT: 49 U/L — ABNORMAL HIGH (ref 0–44)
AST: 177 U/L — ABNORMAL HIGH (ref 15–41)
Albumin: 2.9 g/dL — ABNORMAL LOW (ref 3.5–5.0)
Alkaline Phosphatase: 111 U/L (ref 38–126)
Anion gap: 10 (ref 5–15)
BUN: 14 mg/dL (ref 8–23)
CO2: 17 mmol/L — ABNORMAL LOW (ref 22–32)
Calcium: 9.1 mg/dL (ref 8.9–10.3)
Chloride: 109 mmol/L (ref 98–111)
Creatinine, Ser: 1.24 mg/dL — ABNORMAL HIGH (ref 0.44–1.00)
GFR, Estimated: 48 mL/min — ABNORMAL LOW (ref >60)
Glucose, Bld: 144 mg/dL — ABNORMAL HIGH (ref 70–99)
Potassium: 4.3 mmol/L (ref 3.5–5.1)
Sodium: 136 mmol/L (ref 135–145)
Total Bilirubin: 3.6 mg/dL — ABNORMAL HIGH (ref 0.3–1.2)
Total Protein: 5.7 g/dL — ABNORMAL LOW (ref 6.5–8.1)

## 2020-12-30 LAB — PROTIME-INR
INR: 2 — ABNORMAL HIGH (ref 0.8–1.2)
Prothrombin Time: 21.5 seconds — ABNORMAL HIGH (ref 11.4–15.2)

## 2020-12-30 LAB — GLUCOSE, CAPILLARY
Glucose-Capillary: 100 mg/dL — ABNORMAL HIGH (ref 70–99)
Glucose-Capillary: 124 mg/dL — ABNORMAL HIGH (ref 70–99)
Glucose-Capillary: 87 mg/dL (ref 70–99)
Glucose-Capillary: 92 mg/dL (ref 70–99)

## 2020-12-30 LAB — HEPARIN LEVEL (UNFRACTIONATED): Heparin Unfractionated: 0.42 IU/mL (ref 0.30–0.70)

## 2020-12-30 LAB — MAGNESIUM: Magnesium: 1.9 mg/dL (ref 1.7–2.4)

## 2020-12-30 LAB — PHOSPHORUS: Phosphorus: 2.2 mg/dL — ABNORMAL LOW (ref 2.5–4.6)
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
## 2020-12-30 NOTE — Progress Notes (Signed)
[facility] NEPHROLOGY PROGRESS NOTE  Assessment/ Plan: Pt is a 65 y.o. 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  She is in need of TIPS procedure for definitive treatment.  We are consulted for acute kidney injury.    # Acute kidney injury most likely due to hepatorenal syndrome, did not improve with fluid resuscitation.  The volume status looks acceptable. Urine Na<10.  There is still no urine output recorded.  Creatinine improved to 1.24 today.  Patient reports adequate urine output and nursing staff also reports that she is urinating but they are having trouble measuring it.  Patient still has trace lower extremity edema.  She completed IV albumin.  Patient initially received octreotide, albumin, midodrine.  Octreotide and albumin discontinued but midodrine continued.  Patient's blood pressures are responding well to midodrine, continue midodrine at this time..  Recommend daily lab, strict ins and out.  Encourage strict I's and O's.  Patient's creatinine is trending to baseline.  Nephrology team will sign off, feel free to reconsult if necessary.  #Hypotension:  Variable BP with episode of hypotension.  Overall improved with albumin and midodrine.  Continue midodrine at this time   #NASH cirrhosis with hepatic hydrothorax: Plan for TIPS procedure when patient is stable.  Meld score of 24.  Currently managed by GI and primary team.  #Hyponatremia: Sodium level improved  #Metabolic acidosis complicated due to liver disease:  started sodium bicarbonate.   Subjective: Patient feels okay this morning.  Reports that she is peeing well.  She has questions regarding if she will be able to have the procedure.  She is oriented to person, place, time and she knows why she is in the hospital but then looks at me and asks what she is done.  She feels that something is wrong.  Difficult to determine what she is talking  about.. Objective Vital signs in last 24 hours: Vitals:   12/29/20 1417 12/29/20 2051 12/30/20 0509 12/30/20 0744  BP: (!) 99/50 (!) 132/95 (!) 108/36   Pulse: 74 82 88 87  Resp: '15 20 20   '$ Temp: 97.8 F (36.6 C) 98.7 F (37.1 C) 98.3 F (36.8 C)   TempSrc:      SpO2: 91% 90% 91% (!) 87%  Weight:      Height:       Weight change:  No intake or output data in the 24 hours ending 12/30/20 0758     Labs: Basic Metabolic Panel: Recent Labs  Lab 12/28/20 0338 12/29/20 0240 12/30/20 0101  NA 133* 135 136  K 4.7 4.2 4.3  CL 107 111 109  CO2 15* 17* 17*  GLUCOSE 100* 141* 144*  BUN '20 16 14  '$ CREATININE 1.36* 1.36* 1.24*  CALCIUM 8.9 9.1 9.1  PHOS 2.1* 2.0* 2.2*   Liver Function Tests: Recent Labs  Lab 12/28/20 0338 12/29/20 0240 12/30/20 0101  AST 194* 182* 177*  ALT 45* 48* 49*  ALKPHOS 102 107 111  BILITOT 3.2* 3.2* 3.6*  PROT 5.6* 5.4* 5.7*  ALBUMIN 3.3* 3.1* 2.9*   No results for input(s): LIPASE, AMYLASE in the last 168 hours. No results for input(s): AMMONIA in the last 168 hours. CBC: Recent Labs  Lab 12/26/20 0211 12/27/20 0227 12/28/20 0338 12/29/20 0240 12/30/20 0101  WBC 13.5* 13.7* 15.0* 13.0* 14.0*  NEUTROABS 9.3* 8.8* 9.8* 8.6* 10.9*  HGB 9.7* 9.4* 9.0* 8.9* 8.9*  HCT 26.4* 26.0* 25.4* 25.9* 25.4*  MCV 77.4* 78.3*  82.2 81.7 80.9  PLT 184 188 187 173 180   Cardiac Enzymes: No results for input(s): CKTOTAL, CKMB, CKMBINDEX, TROPONINI in the last 168 hours. CBG: Recent Labs  Lab 12/28/20 1612 12/28/20 2025 12/29/20 0759 12/29/20 1602 12/29/20 2110  GLUCAP 129* 99 123* 112* 102*    Iron Studies:  Recent Labs    12/28/20 0338  IRON 38  TIBC 111*  FERRITIN 44   Studies/Results: No results found.  Medications: Infusions: . heparin 1,650 Units/hr (12/30/20 0512)    Scheduled Medications: . clotrimazole  10 mg Oral 5 X Daily  . famotidine  20 mg Oral QHS  . feeding supplement  237 mL Oral TID BM  . FLUoxetine  80 mg  Oral Daily  . insulin aspart  0-9 Units Subcutaneous TID WC  . midodrine  15 mg Oral TID WC  . multivitamin with minerals  1 tablet Oral Daily  . pantoprazole  40 mg Oral q morning  . sodium bicarbonate  1,300 mg Oral BID    have reviewed scheduled and prn medications.  Physical Exam: General: Lying in bed, comfortable, no acute distress Heart: Regular rate and rhythm, no murmurs appreciated Lungs: Clear to auscultation bilaterally, no crackles appreciated Abdomen:soft, Non-tender, non-distended Extremities: +1 lower extremity edema bilaterally Neurology: Alert awake and following commands.  Oriented to person, place, time but then asks vague strange questions regarding if she has done something wrong and statement saying that she thinks something is wrong.  Gifford Shave 12/30/2020,7:58 AM  LOS: 20 days
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
## 2020-12-30 NOTE — Progress Notes (Signed)
PT stated pt was not feeling well and seem out of breath and had been transferred back to bed from the [facility]. Nurse went in to assess and she was resting breathing treatment was given and pt seem to relax after treatment was given. Pt said she was feel a lot better in bed resting with call bell and bed alarm on and bed at lowest position.

## 2020-12-30 NOTE — Progress Notes (Signed)
[place] for Heparin Indication: pulmonary embolus  Labs: Recent Labs    12/28/20 0338 12/28/20 0607 12/28/20 0608 12/28/20 1305 12/29/20 0240 12/30/20 0101 12/30/20 0104  HGB 9.0*  --   --   --  8.9* 8.9*  --   HCT 25.4*  --   --   --  25.9* 25.4*  --   PLT 187  --   --   --  173 180  --   LABPROT  --  23.2*  --   --  24.1*  --   --   INR  --  2.1*  --   --  2.3*  --   --   HEPARINUNFRC  --   --    < > 0.30 0.33  --  0.42  CREATININE 1.36*  --   --   --  1.36* 1.24*  --    < > = values in this interval not displayed.    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 Per Dr. Melvyn Novas 05/05/18: continue half dose for life, but see a hematologist to consider discontinuation in the future.  Pharmacy consulted to dose heparin infusion for history of PE.  Patient is s/p Pleurx cath placement, chest tube removal and liver biopsy on 12/23/20 and heparin resumed afterward. Heparin level therapeutic at 0.42. Hgb stable and platelets remain stable. No s/sx of bleeding or infusion issues.   Goal of Therapy:  Heparin level 0.3-0.7 units/mL Monitor platelets by anticoagulation protocol: Yes   Plan:  Continue herpain at 1650 units/hr Monitor s/sx of bleed Daily heparin level and CBC  Sandy Haye A. Levada Dy, PharmD, BCPS, FNKF Clinical Pharmacist [place] Please utilize Amion for appropriate phone number to reach the unit pharmacist ([place])   12/30/2020 8:29 AM

## 2020-12-31 ENCOUNTER — Inpatient Hospital Stay (HOSPITAL_COMMUNITY): Payer: PPO

## 2020-12-31 DIAGNOSIS — J90 Pleural effusion, not elsewhere classified: Secondary | ICD-10-CM | POA: Diagnosis not present

## 2020-12-31 DIAGNOSIS — I700 Atherosclerosis of aorta: Secondary | ICD-10-CM | POA: Diagnosis not present

## 2020-12-31 DIAGNOSIS — E669 Obesity, unspecified: Secondary | ICD-10-CM | POA: Diagnosis not present

## 2020-12-31 DIAGNOSIS — K7460 Unspecified cirrhosis of liver: Secondary | ICD-10-CM | POA: Diagnosis not present

## 2020-12-31 LAB — CBC WITH DIFFERENTIAL/PLATELET
Abs Immature Granulocytes: 0.04 10*3/uL (ref 0.00–0.07)
Basophils Absolute: 0.1 10*3/uL (ref 0.0–0.1)
Basophils Relative: 1 %
Eosinophils Absolute: 0.4 10*3/uL (ref 0.0–0.5)
Eosinophils Relative: 3 %
HCT: 25 % — ABNORMAL LOW (ref 36.0–46.0)
Hemoglobin: 8.7 g/dL — ABNORMAL LOW (ref 12.0–15.0)
Immature Granulocytes: 0 %
Lymphocytes Relative: 17 %
Lymphs Abs: 2.6 10*3/uL (ref 0.7–4.0)
MCH: 28.2 pg (ref 26.0–34.0)
MCHC: 34.8 g/dL (ref 30.0–36.0)
MCV: 81.2 fL (ref 80.0–100.0)
Monocytes Absolute: 1.1 10*3/uL — ABNORMAL HIGH (ref 0.1–1.0)
Monocytes Relative: 7 %
Neutro Abs: 10.8 10*3/uL — ABNORMAL HIGH (ref 1.7–7.7)
Neutrophils Relative %: 72 %
Platelets: 187 10*3/uL (ref 150–400)
RBC: 3.08 MIL/uL — ABNORMAL LOW (ref 3.87–5.11)
RDW: 28 % — ABNORMAL HIGH (ref 11.5–15.5)
WBC: 15 10*3/uL — ABNORMAL HIGH (ref 4.0–10.5)
nRBC: 0 % (ref 0.0–0.2)

## 2020-12-31 LAB — COMPREHENSIVE METABOLIC PANEL
ALT: 45 U/L — ABNORMAL HIGH (ref 0–44)
AST: 173 U/L — ABNORMAL HIGH (ref 15–41)
Albumin: 2.7 g/dL — ABNORMAL LOW (ref 3.5–5.0)
Alkaline Phosphatase: 109 U/L (ref 38–126)
Anion gap: 7 (ref 5–15)
BUN: 16 mg/dL (ref 8–23)
CO2: 19 mmol/L — ABNORMAL LOW (ref 22–32)
Calcium: 9 mg/dL (ref 8.9–10.3)
Chloride: 110 mmol/L (ref 98–111)
Creatinine, Ser: 1.2 mg/dL — ABNORMAL HIGH (ref 0.44–1.00)
GFR, Estimated: 50 mL/min — ABNORMAL LOW (ref >60)
Glucose, Bld: 114 mg/dL — ABNORMAL HIGH (ref 70–99)
Potassium: 4.4 mmol/L (ref 3.5–5.1)
Sodium: 136 mmol/L (ref 135–145)
Total Bilirubin: 3.5 mg/dL — ABNORMAL HIGH (ref 0.3–1.2)
Total Protein: 5.3 g/dL — ABNORMAL LOW (ref 6.5–8.1)

## 2020-12-31 LAB — GLUCOSE, CAPILLARY
Glucose-Capillary: 101 mg/dL — ABNORMAL HIGH (ref 70–99)
Glucose-Capillary: 107 mg/dL — ABNORMAL HIGH (ref 70–99)
Glucose-Capillary: 116 mg/dL — ABNORMAL HIGH (ref 70–99)
Glucose-Capillary: 118 mg/dL — ABNORMAL HIGH (ref 70–99)
Glucose-Capillary: 120 mg/dL — ABNORMAL HIGH (ref 70–99)
Glucose-Capillary: 136 mg/dL — ABNORMAL HIGH (ref 70–99)
Glucose-Capillary: 96 mg/dL (ref 70–99)

## 2020-12-31 LAB — PHOSPHORUS: Phosphorus: 2.4 mg/dL — ABNORMAL LOW (ref 2.5–4.6)

## 2020-12-31 LAB — PROTIME-INR
INR: 2.1 — ABNORMAL HIGH (ref 0.8–1.2)
Prothrombin Time: 22.7 seconds — ABNORMAL HIGH (ref 11.4–15.2)

## 2020-12-31 LAB — HEPARIN LEVEL (UNFRACTIONATED): Heparin Unfractionated: 0.32 IU/mL (ref 0.30–0.70)

## 2020-12-31 LAB — AMMONIA: Ammonia: 67 umol/L — ABNORMAL HIGH (ref 9–35)

## 2020-12-31 LAB — MAGNESIUM: Magnesium: 1.9 mg/dL (ref 1.7–2.4)
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
## 2020-12-31 NOTE — Progress Notes (Signed)
[place] for Heparin Indication: pulmonary embolus  Labs: Recent Labs    12/29/20 0240 12/30/20 0101 12/30/20 0104 12/30/20 0846 12/31/20 0057  HGB 8.9* 8.9*  --   --  8.7*  HCT 25.9* 25.4*  --   --  25.0*  PLT 173 180  --   --  187  LABPROT 24.1*  --   --  21.5* 22.7*  INR 2.3*  --   --  2.0* 2.1*  HEPARINUNFRC 0.33  --  0.42  --  0.32  CREATININE 1.36* 1.24*  --   --  1.20*    Assessment: Patient with history of PE in 2019 on once-daily apixaban PTA (last dose on 1/27). Per Dr. Melvyn Novas 05/05/18: continue half dose for life, but see a hematologist to consider discontinuation in the future.  Pharmacy consulted to dose heparin infusion for history of PE.  Patient is s/p Pleurx cath placement, chest tube removal and liver biopsy on 12/23/20 and heparin resumed afterward. Heparin level therapeutic at 0.32. Hgb stable and platelets remain stable. Patient reporting some black stools. GI and primary aware. Will wait for order to hold heparin if needed.    Goal of Therapy:  Heparin level 0.3-0.7 units/mL Monitor platelets by anticoagulation protocol: Yes   Plan:  Continue herpain at 1650 units/hr Monitor s/sx of bleed Daily heparin level and CBC  Manvi Guilliams A. Levada Dy, PharmD, BCPS, FNKF Clinical Pharmacist [place] Please utilize Amion for appropriate phone number to reach the unit pharmacist ([place])   12/31/2020 7:45 AM

## 2020-12-31 NOTE — Plan of Care (Signed)
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
## 2021-01-01 ENCOUNTER — Inpatient Hospital Stay (HOSPITAL_COMMUNITY): Payer: PPO

## 2021-01-01 DIAGNOSIS — K7460 Unspecified cirrhosis of liver: Secondary | ICD-10-CM | POA: Diagnosis not present

## 2021-01-01 DIAGNOSIS — J90 Pleural effusion, not elsewhere classified: Secondary | ICD-10-CM | POA: Diagnosis not present

## 2021-01-01 DIAGNOSIS — R188 Other ascites: Secondary | ICD-10-CM | POA: Diagnosis not present

## 2021-01-01 DIAGNOSIS — R1319 Other dysphagia: Secondary | ICD-10-CM | POA: Diagnosis not present

## 2021-01-01 DIAGNOSIS — J9601 Acute respiratory failure with hypoxia: Secondary | ICD-10-CM | POA: Diagnosis not present

## 2021-01-01 LAB — CBC WITH DIFFERENTIAL/PLATELET
Abs Immature Granulocytes: 0.08 10*3/uL — ABNORMAL HIGH (ref 0.00–0.07)
Basophils Absolute: 0.1 10*3/uL (ref 0.0–0.1)
Basophils Relative: 1 %
Eosinophils Absolute: 0.5 10*3/uL (ref 0.0–0.5)
Eosinophils Relative: 3 %
HCT: 27.7 % — ABNORMAL LOW (ref 36.0–46.0)
Hemoglobin: 9.3 g/dL — ABNORMAL LOW (ref 12.0–15.0)
Immature Granulocytes: 1 %
Lymphocytes Relative: 18 %
Lymphs Abs: 3 10*3/uL (ref 0.7–4.0)
MCH: 27.6 pg (ref 26.0–34.0)
MCHC: 33.6 g/dL (ref 30.0–36.0)
MCV: 82.2 fL (ref 80.0–100.0)
Monocytes Absolute: 1.2 10*3/uL — ABNORMAL HIGH (ref 0.1–1.0)
Monocytes Relative: 7 %
Neutro Abs: 12 10*3/uL — ABNORMAL HIGH (ref 1.7–7.7)
Neutrophils Relative %: 70 %
Platelets: 160 10*3/uL (ref 150–400)
RBC: 3.37 MIL/uL — ABNORMAL LOW (ref 3.87–5.11)
RDW: 29.3 % — ABNORMAL HIGH (ref 11.5–15.5)
WBC: 16.8 10*3/uL — ABNORMAL HIGH (ref 4.0–10.5)
nRBC: 0 % (ref 0.0–0.2)

## 2021-01-01 LAB — COMPREHENSIVE METABOLIC PANEL
ALT: 55 U/L — ABNORMAL HIGH (ref 0–44)
AST: 194 U/L — ABNORMAL HIGH (ref 15–41)
Albumin: 2.7 g/dL — ABNORMAL LOW (ref 3.5–5.0)
Alkaline Phosphatase: 114 U/L (ref 38–126)
Anion gap: 8 (ref 5–15)
BUN: 19 mg/dL (ref 8–23)
CO2: 18 mmol/L — ABNORMAL LOW (ref 22–32)
Calcium: 9.1 mg/dL (ref 8.9–10.3)
Chloride: 109 mmol/L (ref 98–111)
Creatinine, Ser: 1.2 mg/dL — ABNORMAL HIGH (ref 0.44–1.00)
GFR, Estimated: 50 mL/min — ABNORMAL LOW (ref >60)
Glucose, Bld: 94 mg/dL (ref 70–99)
Potassium: 5.7 mmol/L — ABNORMAL HIGH (ref 3.5–5.1)
Sodium: 135 mmol/L (ref 135–145)
Total Bilirubin: 4.1 mg/dL — ABNORMAL HIGH (ref 0.3–1.2)
Total Protein: 5.5 g/dL — ABNORMAL LOW (ref 6.5–8.1)

## 2021-01-01 LAB — GLUCOSE, CAPILLARY
Glucose-Capillary: 89 mg/dL (ref 70–99)
Glucose-Capillary: 90 mg/dL (ref 70–99)
Glucose-Capillary: 94 mg/dL (ref 70–99)
Glucose-Capillary: 147 mg/dL — ABNORMAL HIGH (ref 70–99)
Glucose-Capillary: 98 mg/dL (ref 70–99)

## 2021-01-01 LAB — PHOSPHORUS: Phosphorus: 3 mg/dL (ref 2.5–4.6)

## 2021-01-01 LAB — PROTIME-INR
INR: 2.1 — ABNORMAL HIGH (ref 0.8–1.2)
Prothrombin Time: 22.4 seconds — ABNORMAL HIGH (ref 11.4–15.2)

## 2021-01-01 LAB — MAGNESIUM: Magnesium: 1.9 mg/dL (ref 1.7–2.4)

## 2021-01-01 LAB — HEPARIN LEVEL (UNFRACTIONATED): Heparin Unfractionated: 0.41 IU/mL (ref 0.30–0.70)

## 2021-01-01 LAB — AMMONIA: Ammonia: 56 umol/L — ABNORMAL HIGH (ref 9–35)

## 2021-01-01 MED ORDER — SODIUM ZIRCONIUM CYCLOSILICATE 10 G PO PACK
10.0000 g | PACK | Freq: Once | ORAL | Status: AC
  Administered 2021-01-01: 10 g via ORAL
  Filled 2021-01-01: qty 1

## 2021-01-01 MED ORDER — LACTULOSE 10 GM/15ML PO SOLN
20.0000 g | Freq: Two times a day (BID) | ORAL | Status: DC
  Administered 2021-01-01: 20 g via ORAL
  Filled 2021-01-01: qty 30

## 2021-01-01 NOTE — Progress Notes (Addendum)
Daily Rounding Note  01/01/2021, 1:59 PM  LOS: 22 days   SUBJECTIVE:   Chief complaint:      Pressures 90s/30s -112/49.  Heart rate in the 80s.  Sats 90 to 97% on room air.  Measurement of Pleurx catheter output 1 L daily in the previous 2 days.  The catheter output is measured once daily, has not been measured yet today.  Feels like her breathing is good.  She is ambulating in the room.  Stools were loose this morning. Appetite fair to good.  OBJECTIVE:         Vital signs in last 24 hours:    Temp:  [97.9 F (36.6 C)-98.2 F (36.8 C)] 98.2 F (36.8 C) (02/16 0800) Pulse Rate:  [81-85] 85 (02/16 0800) Resp:  [16] 16 (02/16 0800) BP: (98-112)/(33-49) 112/49 (02/16 0800) SpO2:  [90 %] 90 % (02/15 1424) Last BM Date: 12/31/20 Filed Weights   12/09/20 2023 12/21/20 1504  Weight: 90.7 kg 90.7 kg   General: Looks chronically unwell but comfortable and alert. Heart: RRR. Chest: Clear bilaterally.  No labored breathing. Abdomen: Soft, nontender, active bowel sounds. Extremities: No CCE. Neuro/Psych: No tremor.  Alert.  Oriented x3.  Moves all four limbs.  Intake/Output from previous day: 02/15 0701 - 02/16 0700 In: -  Out: 1100 [Urine:100; Chest Tube:1000]  Intake/Output this shift: No intake/output data recorded.  Lab Results: Recent Labs    12/30/20 0101 12/31/20 0057 01/01/21 0049  WBC 14.0* 15.0* 16.8*  HGB 8.9* 8.7* 9.3*  HCT 25.4* 25.0* 27.7*  PLT 180 187 160   BMET Recent Labs    12/30/20 0101 12/31/20 0057 01/01/21 0049  NA 136 136 135  K 4.3 4.4 5.7*  CL 109 110 109  CO2 17* 19* 18*  GLUCOSE 144* 114* 94  BUN '14 16 19  '$ CREATININE 1.24* 1.20* 1.20*  CALCIUM 9.1 9.0 9.1   LFT Recent Labs    12/30/20 0101 12/31/20 0057 01/01/21 0049  PROT 5.7* 5.3* 5.5*  ALBUMIN 2.9* 2.7* 2.7*  AST 177* 173* 194*  ALT 49* 45* 55*  ALKPHOS 111 109 114  BILITOT 3.6* 3.5* 4.1*   PT/INR Recent  Labs    12/31/20 0057 01/01/21 0049  LABPROT 22.7* 22.4*  INR 2.1* 2.1*   Hepatitis Panel No results for input(s): HEPBSAG, HCVAB, HEPAIGM, HEPBIGM in the last 72 hours.  Studies/Results: DG CHEST PORT 1 VIEW  Result Date: 01/01/2021 CLINICAL DATA:  Shortness of breath. EXAM: PORTABLE CHEST 1 VIEW COMPARISON:  12/31/2020.  12/21/2020. FINDINGS: Right chest tube in stable position. No pneumothorax. Heart size stable. Low lung volumes. Diffuse bilateral pulmonary infiltrates/edema again noted without interim change. No pleural effusion. IMPRESSION: 1. Right chest tube in stable position. No pneumothorax. 2. Low lung volumes. Diffuse bilateral pulmonary infiltrates/edema again noted without interim change. Electronically Signed   By: Marcello Moores  Register   On: 01/01/2021 06:04   DG CHEST PORT 1 VIEW  Result Date: 12/31/2020 CLINICAL DATA:  Inpatient, shortness of breath. EXAM: PORTABLE CHEST 1 VIEW COMPARISON:  12/21/2020 FINDINGS: Right chest tube overlies the lower hemithorax. The heart size and mediastinal contours are unchanged. Hilar vasculature prominence. Aortic arch calcification. No focal consolidation. Interval increase in interstitial markings that are more prominent within the upper lobes. Trace right pleural effusion. No left pleural effusion. Previously identified right apical pneumothorax not visualized. No left neumothorax. No acute osseous abnormality. IMPRESSION: 1. Pulmonary edema. 2. Trace right pleural effusion.  3. Interval resolution of right apical pneumothorax. Electronically Signed   By: Iven Finn M.D.   On: 12/31/2020 05:32    Scheduled Meds: . clotrimazole  10 mg Oral 5 X Daily  . famotidine  20 mg Oral QHS  . feeding supplement  237 mL Oral TID BM  . FLUoxetine  80 mg Oral Daily  . insulin aspart  0-9 Units Subcutaneous TID WC  . lactulose  20 g Oral BID  . midodrine  15 mg Oral TID WC  . multivitamin with minerals  1 tablet Oral Daily  . pantoprazole  40 mg  Oral q morning  . sodium bicarbonate  1,300 mg Oral BID   Continuous Infusions: . heparin 1,650 Units/hr (01/01/21 0221)   PRN Meds:.acetaminophen **OR** acetaminophen, albuterol **OR** albuterol, alum & mag hydroxide-simeth, iohexol, oxyCODONE, prochlorperazine   ASSESMENT:   *   NASH cirrhosis.  Severe active steatohepatitis and cirrhosis on liver biopsy 2/5. No varices, no portal gastropathy on EGD/EUS 12/21/2020.  *    Candida esophagitis.  Ongoing clotrimazole day 12.  *    Right pleural effusion/hepatic hydrothorax. Initial treatment with chest tube.  Pleurx catheter placed 2/4. 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  Remains on midodrine for pressure support.  Completed treatment with octreotide, albumin. IR unable to pursue TIPS due to high meld.  Meld sodium of 23 on 2/14. MELDNa 23 today.    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  Required discontinue of diuretics.  Remains on midodrine for pressure support but blood pressures remain soft.  Completed treatment with octreotide/albumin. BUN/creatinine improved 19/1.2.  GFR 50.  Not recording all urine output, on 100 and cc recorded on 2/15.  Nephrology signed off 2/14.  *    Coagulopathy.  INR 2.1.  *    Chronic AC PTA.  Eliquis on hold, heparin drip in place.  *    [place] anemia.   PLAN   *   Unfortunately until her MELD score achieves a score of 18 or less, IR cannot place TIPS due to high risk of further hepatic decompensation. From a GI standpoint she is stable. Renal function has improved and has stabilized. Suspect it might be several weeks, if at all, before her MELD is at an acceptable level for TIPS. The Pleurx catheter is functioning well. From a GI standpoint she could go home or to a facility, may need rehab.  She should establish with a hepatology service, such as [facility] or [facility], as an outpatient. Will arrange follow-up with Dr. Rush Landmark.   Recommend outpatient CMP, CBC, PT/INR every couple weeks after  discharge. IR will follow the Pleurx as inpatient and as outpatient. GI will sign off at this point, available if needed.   Lori Cooper  01/01/2021, 1:59 PM Phone [phone]     Attending Physician Note   I have taken an interval history, reviewed the chart and examined the patient. I agree with the Advanced Practitioner's note, impression and recommendations.   Lucio Edward, MD FACG [phone]

## 2021-01-01 NOTE — Progress Notes (Signed)
OT Cancellation Note  Patient Details Name: Lori Cooper MRN: [id number] DOB: 07-Aug-1956   Cancelled Treatment:    Reason Eval/Treat Not Completed: Medical issues which prohibited therapy. Potassium currently 5.7. Will hold OT session for now and follow-up later as appropriate.   Layla Maw 01/01/2021, 8:50 AM
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
## 2021-01-01 NOTE — TOC Progression Note (Signed)
Transition of Care [facility]) - Progression Note    Patient Details  Name: Lori Cooper MRN: [id number] Date of Birth: 19-Oct-1956  Transition of Care [facility]) CM/SW Contact  Joanne Chars, LCSW Phone Number: 01/01/2021, 8:31 AM  Clinical Narrative:   CSW continues to follow for discharge needs.  Pt not medically ready for discharge.       Barriers to Discharge: Continued Medical Work up  Expected Discharge Plan and Services           Expected Discharge Date: 12/12/20                         HH Arranged: PT [place]: [place] Date [place]: 12/25/20 Time [place]: 1310 Representative spoke with at [place]: [place] ([place]) Interventions    Readmission Risk Interventions Readmission Risk Prevention Plan 12/12/2020  Transportation Screening Complete  PCP or Specialist Appt within 3-5 Days Complete  HRI or [place] (No Data)  Social Work Consult for [place] Planning/Counseling Complete  Palliative Care Screening Not Applicable  Some recent data might be hidden

## 2021-01-01 NOTE — Progress Notes (Signed)
[place] for Heparin Indication: pulmonary embolus  Labs: Recent Labs    12/30/20 0101 12/30/20 0104 12/30/20 0846 12/31/20 0057 01/01/21 0049  HGB 8.9*  --   --  8.7* 9.3*  HCT 25.4*  --   --  25.0* 27.7*  PLT 180  --   --  187 160  LABPROT  --   --  21.5* 22.7* 22.4*  INR  --   --  2.0* 2.1* 2.1*  HEPARINUNFRC  --  0.42  --  0.32 0.41  CREATININE 1.24*  --   --  1.20* 1.20*    Assessment: Patient with history of PE in 2019 on once-daily apixaban PTA (last dose on 1/27). Per Dr. Melvyn Novas 05/05/18: continue half dose for life, but see a hematologist to consider discontinuation in the future.  Pharmacy consulted to dose heparin infusion for history of PE.  Patient is s/p Pleurx cath placement, chest tube removal and liver biopsy on 12/23/20 and heparin resumed afterward. Heparin level therapeutic at 0.41. Hgb stable and platelets remain stable. Patient reporting some black stools. GI and primary aware. Will wait for order to hold heparin if needed.    Goal of Therapy:  Heparin level 0.3-0.7 units/mL Monitor platelets by anticoagulation protocol: Yes   Plan:  Continue herpain at 1650 units/hr Monitor s/sx of bleed Daily heparin level and CBC  Mahala Rommel A. Levada Dy, PharmD, BCPS, FNKF Clinical Pharmacist [place] Please utilize Amion for appropriate phone number to reach the unit pharmacist ([place])   01/01/2021 7:24 AM

## 2021-01-01 NOTE — Plan of Care (Signed)

## 2021-01-02 DIAGNOSIS — J9601 Acute respiratory failure with hypoxia: Secondary | ICD-10-CM | POA: Diagnosis not present

## 2021-01-02 DIAGNOSIS — J90 Pleural effusion, not elsewhere classified: Secondary | ICD-10-CM | POA: Diagnosis not present

## 2021-01-02 DIAGNOSIS — K7460 Unspecified cirrhosis of liver: Secondary | ICD-10-CM | POA: Diagnosis not present

## 2021-01-02 DIAGNOSIS — R1319 Other dysphagia: Secondary | ICD-10-CM | POA: Diagnosis not present

## 2021-01-02 LAB — CBC
HCT: 26 % — ABNORMAL LOW (ref 36.0–46.0)
Hemoglobin: 8.9 g/dL — ABNORMAL LOW (ref 12.0–15.0)
MCH: 29 pg (ref 26.0–34.0)
MCHC: 34.2 g/dL (ref 30.0–36.0)
MCV: 84.7 fL (ref 80.0–100.0)
Platelets: 153 10*3/uL (ref 150–400)
RBC: 3.07 MIL/uL — ABNORMAL LOW (ref 3.87–5.11)
RDW: 30.2 % — ABNORMAL HIGH (ref 11.5–15.5)
WBC: 15 10*3/uL — ABNORMAL HIGH (ref 4.0–10.5)
nRBC: 0 % (ref 0.0–0.2)

## 2021-01-02 LAB — COMPREHENSIVE METABOLIC PANEL
ALT: 49 U/L — ABNORMAL HIGH (ref 0–44)
AST: 190 U/L — ABNORMAL HIGH (ref 15–41)
Albumin: 2.5 g/dL — ABNORMAL LOW (ref 3.5–5.0)
Alkaline Phosphatase: 117 U/L (ref 38–126)
Anion gap: 12 (ref 5–15)
BUN: 20 mg/dL (ref 8–23)
CO2: 14 mmol/L — ABNORMAL LOW (ref 22–32)
Calcium: 8.9 mg/dL (ref 8.9–10.3)
Chloride: 108 mmol/L (ref 98–111)
Creatinine, Ser: 1.29 mg/dL — ABNORMAL HIGH (ref 0.44–1.00)
GFR, Estimated: 46 mL/min — ABNORMAL LOW (ref >60)
Glucose, Bld: 76 mg/dL (ref 70–99)
Potassium: 4.4 mmol/L (ref 3.5–5.1)
Sodium: 134 mmol/L — ABNORMAL LOW (ref 135–145)
Total Bilirubin: 4 mg/dL — ABNORMAL HIGH (ref 0.3–1.2)
Total Protein: 5.2 g/dL — ABNORMAL LOW (ref 6.5–8.1)

## 2021-01-02 LAB — GLUCOSE, CAPILLARY
Glucose-Capillary: 101 mg/dL — ABNORMAL HIGH (ref 70–99)
Glucose-Capillary: 85 mg/dL (ref 70–99)
Glucose-Capillary: 87 mg/dL (ref 70–99)
Glucose-Capillary: 89 mg/dL (ref 70–99)
Glucose-Capillary: 91 mg/dL (ref 70–99)
Glucose-Capillary: 92 mg/dL (ref 70–99)
Glucose-Capillary: 98 mg/dL (ref 70–99)

## 2021-01-02 LAB — AMMONIA: Ammonia: 60 umol/L — ABNORMAL HIGH (ref 9–35)

## 2021-01-02 LAB — HEPARIN LEVEL (UNFRACTIONATED): Heparin Unfractionated: 0.36 IU/mL (ref 0.30–0.70)

## 2021-01-02 MED ORDER — APIXABAN 5 MG PO TABS
5.0000 mg | ORAL_TABLET | Freq: Two times a day (BID) | ORAL | Status: DC
  Administered 2021-01-02 – 2021-01-10 (×15): 5 mg via ORAL
  Filled 2021-01-02 (×17): qty 1

## 2021-01-02 MED ORDER — LACTULOSE 10 GM/15ML PO SOLN
20.0000 g | Freq: Three times a day (TID) | ORAL | Status: DC
  Administered 2021-01-02 – 2021-01-05 (×11): 20 g via ORAL
  Filled 2021-01-02 (×12): qty 30

## 2021-01-02 MED ORDER — LORAZEPAM 2 MG/ML IJ SOLN
0.5000 mg | Freq: Four times a day (QID) | INTRAMUSCULAR | Status: DC | PRN
  Administered 2021-01-02 – 2021-01-03 (×2): 0.5 mg via INTRAVENOUS
  Filled 2021-01-02: qty 1

## 2021-01-02 NOTE — Progress Notes (Addendum)
ANTICOAGULATION CONSULT NOTE  Pharmacy Consult for Heparin Indication: pulmonary embolus  Labs: Recent Labs    12/30/20 0846 12/31/20 0057 12/31/20 0057 01/01/21 0049 01/02/21 0255 01/02/21 0256  HGB  --  8.7*   < > 9.3* 8.9*  --   HCT  --  25.0*  --  27.7* 26.0*  --   PLT  --  187  --  160 153  --   LABPROT 21.5* 22.7*  --  22.4*  --   --   INR 2.0* 2.1*  --  2.1*  --   --   HEPARINUNFRC  --  0.32  --  0.41  --  0.36  CREATININE  --  1.20*  --  1.20* 1.29*  --    < > = values in this interval not displayed.    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 Per Dr. Melvyn Novas 05/05/18: continue half dose for life, but see a hematologist to consider discontinuation in the future.  Pharmacy consulted to dose heparin infusion for history of PE.  Patient is s/p Pleurx cath placement, chest tube removal and liver biopsy on 12/23/20 and heparin resumed afterward. Heparin level therapeutic at 0.36. Hgb stable and platelets remain stable. Patient was reporting some black stools. GI and primary aware. Patient's MELD still not low enough for TIPS per GI note. Will f/u with primary on restarting DOAC.     Goal of Therapy:  Heparin level 0.3-0.7 units/mL Monitor platelets by anticoagulation protocol: Yes   Plan:  Continue herpain at 1650 units/hr Monitor s/sx of bleed Daily heparin level and CBC F/u restart of DOAC  Melainie Krinsky A. Levada Dy, PharmD, BCPS, FNKF Clinical Pharmacist [place] Please utilize Amion for appropriate phone number to reach the unit pharmacist ([place])  Addendum:  Patient to be transitionned from heparin back to PTA apixaban 5 mg PO BID as no further procedures planned. Will turn off heparin at same time as first dose of apixaban.    Demosthenes Virnig A. Levada Dy, PharmD, BCPS, FNKF Clinical Pharmacist [place] Please utilize Amion for appropriate phone number to reach the unit pharmacist ([place])     01/02/2021 7:25  AM

## 2021-01-02 NOTE — Progress Notes (Signed)
Pt took lactulose, elquis and Midodrine with much encouragement. All other meds offered but pt refused said she was too nauseated to take them. PRN for nausea given earlier. Pt has refused all meals and has drank 240 of water. Called daughter (tracy)to update, and asked her if she was coming up here today, so that she could have teaching provided for the pleurex drain. [name] said she will not be coming up today but would be here tomorrow for pleurex drain teaching.

## 2021-01-02 NOTE — TOC Progression Note (Addendum)
Transition of Care [facility]) - Progression Note    Patient Details  Name: Lori Cooper MRN: [id number] Date of Birth: Apr 02, 1956  Transition of Care [facility]) CM/SW Contact  Joanne Chars, LCSW Phone Number: 01/02/2021, 9:50 AM  Clinical Narrative:   CSW has made the following attempts to secure [facility] services for this pt:  [facility]: cannot work with pleurx drain [facility]: does not have Therapist, sports available for [employer] [facility]: cannot work with pleurx drain [facility]: cannot work with pleurx drain [facility]:  [facility]: cannot work with pleurx drain [facility]: does not cover [place] of network with HTA  Amy at [facility] accepts pt after confirming that [facility] will provide box of drains at discharge as well as availability of family for training and long term management of the pleurx drain.  1420: CSW spoke with daughter [name] regarding plan for the pleurx drain. Daughter has spoken with RN, is willing to be trained in managing the drain.  CSW discussed [facility] services will also be in place to assist but that [name] will be primary person to manage it.  [name] in agreement with this plan and said pt sister in law is an Therapist, sports and also will be available to help with this.  CSW confirmed that [name] does not live with pt but lives about 10 minutes away and is able to be in the home routinely.       Barriers to Discharge: Continued Medical Work up  Expected Discharge Plan and Services           Expected Discharge Date: 12/12/20                         HH Arranged: PT [place]: [place] Date [place]: 12/25/20 Time [place]: 1310 Representative spoke with at [place]: [place] ([place]) Interventions    Readmission Risk Interventions Readmission Risk Prevention Plan 12/12/2020  Transportation Screening Complete  PCP or Specialist Appt within 3-5 Days Complete  HRI or [place]  (No Data)  Social Work Consult for [facility] Planning/Counseling Complete  Palliative Care Screening Not Applicable  Some recent data might be hidden

## 2021-01-02 NOTE — Progress Notes (Signed)
Patient is really confused and verbally abusive to staff this morning. Patient has a red MEWS and is refusing vital signs and care this morning. Notification will be sent to attending.

## 2021-01-02 NOTE — Progress Notes (Signed)
@  2015 Pt was found in her bed moaning. I was able to get her to take most her meds . Advised pt her Ammonia levels were high so it was very important she drank the Lactulose to which she agreed. Pt stated she was very sick and tired of being sick. Checked Pt's vitals and administered Tylenol . Intially her O2 was an 57. I placed O2 @ 3L on patient via nasal canula and she recovered to an O2 of 96. Pt is sleeping comfortably.
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
## 2021-01-02 NOTE — Progress Notes (Signed)
OT Cancellation Note  Patient Details Name: Lori Cooper MRN: [id number] DOB: [birth year]   Cancelled Treatment:    Reason Eval/Treat Not Completed: Other (comment). Per RN, pt with elevated ammonia and agitated today, throwing items at staff. Will hold on OT session for now and follow-up again as time allows.   Layla Maw 01/02/2021, 11:02 AM
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
## 2021-01-02 NOTE — Progress Notes (Signed)
Attempted to draw ABG on patient and she adamantly refused, telling me to get the f**k out of her room.  Pt's nurse at bedside and witnessed the incident.

## 2021-01-03 ENCOUNTER — Inpatient Hospital Stay (HOSPITAL_COMMUNITY): Payer: PPO

## 2021-01-03 DIAGNOSIS — K7460 Unspecified cirrhosis of liver: Secondary | ICD-10-CM | POA: Diagnosis not present

## 2021-01-03 DIAGNOSIS — J9601 Acute respiratory failure with hypoxia: Secondary | ICD-10-CM | POA: Diagnosis not present

## 2021-01-03 DIAGNOSIS — R1319 Other dysphagia: Secondary | ICD-10-CM | POA: Diagnosis not present

## 2021-01-03 DIAGNOSIS — J90 Pleural effusion, not elsewhere classified: Secondary | ICD-10-CM | POA: Diagnosis not present

## 2021-01-03 LAB — CBC
HCT: 25.7 % — ABNORMAL LOW (ref 36.0–46.0)
Hemoglobin: 9.1 g/dL — ABNORMAL LOW (ref 12.0–15.0)
MCH: 28.6 pg (ref 26.0–34.0)
MCHC: 35.4 g/dL (ref 30.0–36.0)
MCV: 80.8 fL (ref 80.0–100.0)
Platelets: 187 10*3/uL (ref 150–400)
RBC: 3.18 MIL/uL — ABNORMAL LOW (ref 3.87–5.11)
RDW: 29.1 % — ABNORMAL HIGH (ref 11.5–15.5)
WBC: 15.1 10*3/uL — ABNORMAL HIGH (ref 4.0–10.5)
nRBC: 0 % (ref 0.0–0.2)

## 2021-01-03 LAB — COMPREHENSIVE METABOLIC PANEL
ALT: 54 U/L — ABNORMAL HIGH (ref 0–44)
AST: 201 U/L — ABNORMAL HIGH (ref 15–41)
Albumin: 2.5 g/dL — ABNORMAL LOW (ref 3.5–5.0)
Alkaline Phosphatase: 121 U/L (ref 38–126)
Anion gap: 11 (ref 5–15)
BUN: 21 mg/dL (ref 8–23)
CO2: 19 mmol/L — ABNORMAL LOW (ref 22–32)
Calcium: 9.3 mg/dL (ref 8.9–10.3)
Chloride: 106 mmol/L (ref 98–111)
Creatinine, Ser: 1.33 mg/dL — ABNORMAL HIGH (ref 0.44–1.00)
GFR, Estimated: 44 mL/min — ABNORMAL LOW (ref >60)
Glucose, Bld: 107 mg/dL — ABNORMAL HIGH (ref 70–99)
Potassium: 4.1 mmol/L (ref 3.5–5.1)
Sodium: 136 mmol/L (ref 135–145)
Total Bilirubin: 4.9 mg/dL — ABNORMAL HIGH (ref 0.3–1.2)
Total Protein: 5.5 g/dL — ABNORMAL LOW (ref 6.5–8.1)

## 2021-01-03 LAB — GLUCOSE, CAPILLARY
Glucose-Capillary: 107 mg/dL — ABNORMAL HIGH (ref 70–99)
Glucose-Capillary: 110 mg/dL — ABNORMAL HIGH (ref 70–99)
Glucose-Capillary: 112 mg/dL — ABNORMAL HIGH (ref 70–99)
Glucose-Capillary: 119 mg/dL — ABNORMAL HIGH (ref 70–99)
Glucose-Capillary: 126 mg/dL — ABNORMAL HIGH (ref 70–99)
Glucose-Capillary: 161 mg/dL — ABNORMAL HIGH (ref 70–99)

## 2021-01-03 LAB — AMMONIA: Ammonia: 37 umol/L — ABNORMAL HIGH (ref 9–35)

## 2021-01-03 NOTE — Progress Notes (Signed)
Patient sats 77% on 4L [place]. Increased o2 to 6L, slow to increase sats.  Placed on NRB, sats increased to 98%. Notified MD

## 2021-01-03 NOTE — Plan of Care (Signed)

## 2021-01-03 NOTE — Progress Notes (Signed)
Able to wean back to 6L [place], sats 92%

## 2021-01-03 NOTE — Progress Notes (Signed)
Daughter Linus Orn) called for update on pt. Daughter stated she would not be coming by for teaching she was working, that when she goes home the sister will be caring for her pleurix drain. Informed daughter (tracey)  that the sister will need to schedule a time to come for teaching on the pleurix drain.
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
## 2021-01-04 DIAGNOSIS — J690 Pneumonitis due to inhalation of food and vomit: Secondary | ICD-10-CM

## 2021-01-04 DIAGNOSIS — J90 Pleural effusion, not elsewhere classified: Secondary | ICD-10-CM | POA: Diagnosis not present

## 2021-01-04 DIAGNOSIS — R1319 Other dysphagia: Secondary | ICD-10-CM | POA: Diagnosis not present

## 2021-01-04 DIAGNOSIS — K7460 Unspecified cirrhosis of liver: Secondary | ICD-10-CM | POA: Diagnosis not present

## 2021-01-04 DIAGNOSIS — J9601 Acute respiratory failure with hypoxia: Secondary | ICD-10-CM | POA: Diagnosis not present

## 2021-01-04 LAB — GLUCOSE, CAPILLARY
Glucose-Capillary: 84 mg/dL (ref 70–99)
Glucose-Capillary: 133 mg/dL — ABNORMAL HIGH (ref 70–99)
Glucose-Capillary: 154 mg/dL — ABNORMAL HIGH (ref 70–99)
Glucose-Capillary: 110 mg/dL — ABNORMAL HIGH (ref 70–99)
Glucose-Capillary: 108 mg/dL — ABNORMAL HIGH (ref 70–99)

## 2021-01-04 LAB — COMPREHENSIVE METABOLIC PANEL
ALT: 55 U/L — ABNORMAL HIGH (ref 0–44)
AST: 198 U/L — ABNORMAL HIGH (ref 15–41)
Albumin: 2.4 g/dL — ABNORMAL LOW (ref 3.5–5.0)
Alkaline Phosphatase: 123 U/L (ref 38–126)
Anion gap: 10 (ref 5–15)
BUN: 22 mg/dL (ref 8–23)
CO2: 18 mmol/L — ABNORMAL LOW (ref 22–32)
Calcium: 8.8 mg/dL — ABNORMAL LOW (ref 8.9–10.3)
Chloride: 108 mmol/L (ref 98–111)
Creatinine, Ser: 1.44 mg/dL — ABNORMAL HIGH (ref 0.44–1.00)
GFR, Estimated: 40 mL/min — ABNORMAL LOW (ref >60)
Glucose, Bld: 108 mg/dL — ABNORMAL HIGH (ref 70–99)
Potassium: 3.8 mmol/L (ref 3.5–5.1)
Sodium: 136 mmol/L (ref 135–145)
Total Bilirubin: 4.7 mg/dL — ABNORMAL HIGH (ref 0.3–1.2)
Total Protein: 5.5 g/dL — ABNORMAL LOW (ref 6.5–8.1)

## 2021-01-04 LAB — CBC
HCT: 25.2 % — ABNORMAL LOW (ref 36.0–46.0)
Hemoglobin: 8.8 g/dL — ABNORMAL LOW (ref 12.0–15.0)
MCH: 28 pg (ref 26.0–34.0)
MCHC: 34.9 g/dL (ref 30.0–36.0)
MCV: 80.3 fL (ref 80.0–100.0)
Platelets: 181 10*3/uL (ref 150–400)
RBC: 3.14 MIL/uL — ABNORMAL LOW (ref 3.87–5.11)
RDW: 28.9 % — ABNORMAL HIGH (ref 11.5–15.5)
WBC: 14.3 10*3/uL — ABNORMAL HIGH (ref 4.0–10.5)
nRBC: 0 % (ref 0.0–0.2)

## 2021-01-04 MED ORDER — FLUOXETINE HCL 20 MG PO CAPS
20.0000 mg | ORAL_CAPSULE | Freq: Every day | ORAL | Status: DC
  Administered 2021-01-05 – 2021-01-10 (×3): 20 mg via ORAL
  Filled 2021-01-04 (×6): qty 1

## 2021-01-04 MED ORDER — AMOXICILLIN-POT CLAVULANATE 875-125 MG PO TABS
1.0000 | ORAL_TABLET | Freq: Two times a day (BID) | ORAL | Status: DC
  Administered 2021-01-04 – 2021-01-06 (×5): 1 via ORAL
  Filled 2021-01-04 (×5): qty 1

## 2021-01-04 MED ORDER — SACCHAROMYCES BOULARDII 250 MG PO CAPS
250.0000 mg | ORAL_CAPSULE | Freq: Two times a day (BID) | ORAL | Status: DC
  Administered 2021-01-04 – 2021-01-10 (×11): 250 mg via ORAL
  Filled 2021-01-04 (×14): qty 1
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
## 2021-01-05 ENCOUNTER — Inpatient Hospital Stay (HOSPITAL_COMMUNITY): Payer: PPO

## 2021-01-05 DIAGNOSIS — K7460 Unspecified cirrhosis of liver: Secondary | ICD-10-CM | POA: Diagnosis not present

## 2021-01-05 DIAGNOSIS — J90 Pleural effusion, not elsewhere classified: Secondary | ICD-10-CM | POA: Diagnosis not present

## 2021-01-05 DIAGNOSIS — R1319 Other dysphagia: Secondary | ICD-10-CM | POA: Diagnosis not present

## 2021-01-05 DIAGNOSIS — J9601 Acute respiratory failure with hypoxia: Secondary | ICD-10-CM | POA: Diagnosis not present

## 2021-01-05 LAB — COMPREHENSIVE METABOLIC PANEL
ALT: 55 U/L — ABNORMAL HIGH (ref 0–44)
AST: 188 U/L — ABNORMAL HIGH (ref 15–41)
Albumin: 2.3 g/dL — ABNORMAL LOW (ref 3.5–5.0)
Alkaline Phosphatase: 128 U/L — ABNORMAL HIGH (ref 38–126)
Anion gap: 7 (ref 5–15)
BUN: 24 mg/dL — ABNORMAL HIGH (ref 8–23)
CO2: 21 mmol/L — ABNORMAL LOW (ref 22–32)
Calcium: 8.8 mg/dL — ABNORMAL LOW (ref 8.9–10.3)
Chloride: 104 mmol/L (ref 98–111)
Creatinine, Ser: 1.61 mg/dL — ABNORMAL HIGH (ref 0.44–1.00)
GFR, Estimated: 35 mL/min — ABNORMAL LOW (ref >60)
Glucose, Bld: 96 mg/dL (ref 70–99)
Potassium: 3.9 mmol/L (ref 3.5–5.1)
Sodium: 132 mmol/L — ABNORMAL LOW (ref 135–145)
Total Bilirubin: 4.8 mg/dL — ABNORMAL HIGH (ref 0.3–1.2)
Total Protein: 5.5 g/dL — ABNORMAL LOW (ref 6.5–8.1)

## 2021-01-05 LAB — GLUCOSE, CAPILLARY
Glucose-Capillary: 90 mg/dL (ref 70–99)
Glucose-Capillary: 140 mg/dL — ABNORMAL HIGH (ref 70–99)
Glucose-Capillary: 107 mg/dL — ABNORMAL HIGH (ref 70–99)
Glucose-Capillary: 111 mg/dL — ABNORMAL HIGH (ref 70–99)
Glucose-Capillary: 96 mg/dL (ref 70–99)

## 2021-01-05 LAB — CBC
HCT: 23.9 % — ABNORMAL LOW (ref 36.0–46.0)
Hemoglobin: 8.8 g/dL — ABNORMAL LOW (ref 12.0–15.0)
MCH: 29.2 pg (ref 26.0–34.0)
MCHC: 36.8 g/dL — ABNORMAL HIGH (ref 30.0–36.0)
MCV: 79.4 fL — ABNORMAL LOW (ref 80.0–100.0)
Platelets: 192 10*3/uL (ref 150–400)
RBC: 3.01 MIL/uL — ABNORMAL LOW (ref 3.87–5.11)
RDW: 28.7 % — ABNORMAL HIGH (ref 11.5–15.5)
WBC: 13.3 10*3/uL — ABNORMAL HIGH (ref 4.0–10.5)
nRBC: 0 % (ref 0.0–0.2)

## 2021-01-05 LAB — AMMONIA: Ammonia: 32 umol/L (ref 9–35)

## 2021-01-05 MED ORDER — BUPROPION HCL ER (XL) 150 MG PO TB24
150.0000 mg | ORAL_TABLET | Freq: Every morning | ORAL | Status: DC
  Administered 2021-01-06 – 2021-01-10 (×2): 150 mg via ORAL
  Filled 2021-01-05 (×5): qty 1

## 2021-01-05 MED ORDER — SODIUM CHLORIDE 0.9 % IV SOLN: INTRAVENOUS | Status: AC
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
## 2021-01-06 DIAGNOSIS — N179 Acute kidney failure, unspecified: Secondary | ICD-10-CM | POA: Diagnosis not present

## 2021-01-06 DIAGNOSIS — K7460 Unspecified cirrhosis of liver: Secondary | ICD-10-CM | POA: Diagnosis not present

## 2021-01-06 DIAGNOSIS — J9601 Acute respiratory failure with hypoxia: Secondary | ICD-10-CM | POA: Diagnosis not present

## 2021-01-06 DIAGNOSIS — J690 Pneumonitis due to inhalation of food and vomit: Secondary | ICD-10-CM | POA: Diagnosis not present

## 2021-01-06 LAB — COMPREHENSIVE METABOLIC PANEL
ALT: 62 U/L — ABNORMAL HIGH (ref 0–44)
AST: 215 U/L — ABNORMAL HIGH (ref 15–41)
Albumin: 2.1 g/dL — ABNORMAL LOW (ref 3.5–5.0)
Alkaline Phosphatase: 141 U/L — ABNORMAL HIGH (ref 38–126)
Anion gap: 8 (ref 5–15)
BUN: 30 mg/dL — ABNORMAL HIGH (ref 8–23)
CO2: 20 mmol/L — ABNORMAL LOW (ref 22–32)
Calcium: 8.6 mg/dL — ABNORMAL LOW (ref 8.9–10.3)
Chloride: 105 mmol/L (ref 98–111)
Creatinine, Ser: 1.97 mg/dL — ABNORMAL HIGH (ref 0.44–1.00)
GFR, Estimated: 28 mL/min — ABNORMAL LOW (ref >60)
Glucose, Bld: 123 mg/dL — ABNORMAL HIGH (ref 70–99)
Potassium: 3.9 mmol/L (ref 3.5–5.1)
Sodium: 133 mmol/L — ABNORMAL LOW (ref 135–145)
Total Bilirubin: 4.6 mg/dL — ABNORMAL HIGH (ref 0.3–1.2)
Total Protein: 5.4 g/dL — ABNORMAL LOW (ref 6.5–8.1)

## 2021-01-06 LAB — GLUCOSE, CAPILLARY
Glucose-Capillary: 132 mg/dL — ABNORMAL HIGH (ref 70–99)
Glucose-Capillary: 64 mg/dL — ABNORMAL LOW (ref 70–99)
Glucose-Capillary: 113 mg/dL — ABNORMAL HIGH (ref 70–99)
Glucose-Capillary: 117 mg/dL — ABNORMAL HIGH (ref 70–99)
Glucose-Capillary: 108 mg/dL — ABNORMAL HIGH (ref 70–99)
Glucose-Capillary: 112 mg/dL — ABNORMAL HIGH (ref 70–99)
Glucose-Capillary: 109 mg/dL — ABNORMAL HIGH (ref 70–99)
Glucose-Capillary: 77 mg/dL (ref 70–99)

## 2021-01-06 LAB — CBC
HCT: 24.6 % — ABNORMAL LOW (ref 36.0–46.0)
Hemoglobin: 8.6 g/dL — ABNORMAL LOW (ref 12.0–15.0)
MCH: 28.7 pg (ref 26.0–34.0)
MCHC: 35 g/dL (ref 30.0–36.0)
MCV: 82 fL (ref 80.0–100.0)
Platelets: 188 10*3/uL (ref 150–400)
RBC: 3 MIL/uL — ABNORMAL LOW (ref 3.87–5.11)
RDW: 29.3 % — ABNORMAL HIGH (ref 11.5–15.5)
WBC: 14.1 10*3/uL — ABNORMAL HIGH (ref 4.0–10.5)
nRBC: 0 % (ref 0.0–0.2)

## 2021-01-06 MED ORDER — ALBUMIN HUMAN 25 % IV SOLN
25.0000 g | Freq: Four times a day (QID) | INTRAVENOUS | Status: AC
  Administered 2021-01-06 (×3): 25 g via INTRAVENOUS
  Filled 2021-01-06 (×3): qty 100

## 2021-01-06 MED ORDER — AMOXICILLIN-POT CLAVULANATE 500-125 MG PO TABS
1.0000 | ORAL_TABLET | Freq: Two times a day (BID) | ORAL | Status: DC
  Administered 2021-01-06 – 2021-01-10 (×7): 500 mg via ORAL
  Filled 2021-01-06 (×10): qty 1

## 2021-01-06 MED ORDER — LACTULOSE 10 GM/15ML PO SOLN
20.0000 g | Freq: Two times a day (BID) | ORAL | Status: DC
  Administered 2021-01-06: 20 g via ORAL
  Filled 2021-01-06: qty 30

## 2021-01-06 MED ORDER — LACTULOSE 10 GM/15ML PO SOLN
10.0000 g | Freq: Two times a day (BID) | ORAL | Status: DC
  Administered 2021-01-06 – 2021-01-08 (×3): 10 g via ORAL
  Filled 2021-01-06 (×6): qty 15

## 2021-01-06 MED ORDER — SODIUM CHLORIDE 0.9 % IV SOLN: INTRAVENOUS | Status: DC

## 2021-01-06 MED ORDER — SODIUM CHLORIDE 0.9 % IV BOLUS
500.0000 mL | Freq: Once | INTRAVENOUS | Status: AC
  Administered 2021-01-06: 500 mL via INTRAVENOUS
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
## 2021-01-06 NOTE — TOC Progression Note (Signed)
Transition of Care [facility]) - Progression Note    Patient Details  Name: Lori Cooper MRN: [id number] Date of Birth: 11-09-1956  Transition of Care [facility]) CM/SW Contact  Joanne Chars, LCSW Phone Number: 01/06/2021, 3:39 PM  Clinical Narrative:   PT reports change in recommendation to SNF.  Pt continues to be confused, CSW spoke with husband regarding new recommendation.  He is having back surgery on Wed and said they cannot afford 24 hour care in the home, he agrees to look for SNF.  Choice document given.  Referral sent out in hub.        Barriers to Discharge: Continued Medical Work up  Expected Discharge Plan and Services           Expected Discharge Date: 12/12/20                         HH Arranged: PT,OT,RN [place] Date [place]: 01/02/21 Time [place]: 1215 Representative spoke with at [place]: [place] ([place]) Interventions    Readmission Risk Interventions Readmission Risk Prevention Plan 12/12/2020  Transportation Screening Complete  PCP or Specialist Appt within 3-5 Days Complete  HRI or [place] (No Data)  Social Work Consult for [place] Planning/Counseling Complete  Palliative Care Screening Not Applicable  Some recent data might be hidden

## 2021-01-06 NOTE — Progress Notes (Addendum)
ANTICOAGULATION CONSULT NOTE - Follow Up Consult  Pharmacy Consult for Eliquis Indication: h/o PE  No Known Allergies  Patient Measurements: Height: '5\' 5"'$  (165.1 cm) Weight: 83.2 kg (183 lb 6.4 oz) IBW/kg (Calculated) : 57  Vital Signs: Temp: 98.1 F (36.7 C) (02/21 0429) Temp Source: Oral (02/21 0429) BP: 95/40 (02/21 0429) Pulse Rate: 79 (02/21 0429)  Labs: Recent Labs    01/04/21 0120 01/05/21 0318 01/06/21 0649  HGB 8.8* 8.8* 8.6*  HCT 25.2* 23.9* 24.6*  PLT 181 192 188  CREATININE 1.44* 1.61* 1.97*    Estimated Creatinine Clearance: 29.9 mL/min (A) (by C-G formula based on SCr of 1.97 mg/dL (H)).   Assessment: Anticoag: Eliquis 5 mg BID for hx PE (01/16/18), LD 1/27 >> Heparin started 1/31 >apixa restart. Hgb trending downward. - 1/24 CTA negative for PE   Goal of Therapy:  Therapeutic oral anticoagulation   Plan:  Eliquis '5mg'$  BID Pharmacy will sign off. Please reconsult for further dosing assitance. Will adjust dose of Augmentin to '500mg'$  BID due to CrCl<30   Avaiah Stempel S. Alford Highland, PharmD, BCPS Clinical Staff Pharmacist [email or web address] Alford Highland, Chinonso Linker Stillinger 01/06/2021,9:19 AM

## 2021-01-06 NOTE — NC FL2 (Signed)
[place] LEVEL OF CARE SCREENING TOOL     IDENTIFICATION  Patient Name: Lori Cooper Birthdate: June 01, 1956 Sex: female Admission Date (Current Location): 12/09/2020  [facility] and [place] Number:  Herbalist and Address:  [facility] [place]. [facility], [place] [address], [place], [place] [address]      Provider Number: [id number]  Attending Physician Name and Address:  Bonnielee Haff, MD  Relative Name and Phone Number:  Herta, Leuenberger [phone]    Current Level of Care: Hospital Recommended Level of Care: [place] Prior Approval Number:    Date Approved/Denied:   PASRR Number:    Discharge Plan: SNF    Current Diagnoses: Patient Active Problem List   Diagnosis Date Noted  . Cirrhosis of liver with ascites ([place])   . Portal hypertension ([place])   . Elevated LFTs   . Other ascites   . 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 Transaminitis 12/18/2020  . Hypotension 12/18/2020  . Hypoalbuminemia 12/18/2020  . Delayed union of rib fracture, left 8th rib 12/18/2020  . Open abdominal wall wound s/p ileocecectomy 12/18/2020  . Moderate malnutrition ([place]) 12/18/2020  . Esophageal dysphagia 12/18/2020  . Prolonged QT interval 12/10/2020  . Diaphragmatic hernia 12/10/2020  . Aortic atherosclerosis ([place]) 12/10/2020  . Pleural effusion on right 12/09/2020  . Class 1 obesity 12/09/2020  . Left ulnar fracture 08/11/2020  . Closed fracture of left olecranon process 08/11/2020  . Traumatic injury of small intestine 08/08/2020  . History of pulmonary embolus (PE) 08/12/2018  . Controlled substance agreement signed 08/12/2018  . Benzodiazepine dependence ([place]) 08/12/2018  . Positive colorectal cancer screening using Cologuard test 07/13/2018  . DOE (dyspnea on exertion) 03/10/2018  . Upper airway cough syndrome 03/10/2018  . Metabolic syndrome [id number]  . Arthritis 07/16/2015  . Depression 12/21/2014  .  Supraumbilical hernia  [id number]  . Incisional hernias x 3 s/p laparoscopic repair with mesh 12/31/2016 04/02/2014  . Personal history of colonic polyps 04/02/2014  . Morbid obesity due to excess calories ([place]) complicated by hyperlipidemia/ preDM 02/13/2014  . Prediabetes 09/21/2013  . Vitamin D deficiency 06/23/2013  . HLD (hyperlipidemia) 02/20/2013  . GERD (gastroesophageal reflux disease) 02/20/2013  . Generalized anxiety disorder 02/20/2013  . Restless legs 02/20/2013    Orientation RESPIRATION BLADDER Height & Weight     Self  Normal Continent Weight: 183 lb 6.4 oz (83.2 kg) Height:  '5\' 5"'$  (165.1 cm)  BEHAVIORAL SYMPTOMS/MOOD NEUROLOGICAL BOWEL NUTRITION STATUS  Verbally abusive   Continent Diet (DYS 3.  See discharge summary)  AMBULATORY STATUS COMMUNICATION OF NEEDS Skin   Limited Assist Verbally Normal                       Personal Care Assistance Level of Assistance  Bathing,Feeding,Dressing Bathing Assistance: Limited assistance Feeding assistance: Limited assistance Dressing Assistance: Limited assistance     Functional Limitations Info  Sight,Hearing,Speech Sight Info: Adequate Hearing Info: Adequate Speech Info: Adequate    SPECIAL CARE FACTORS FREQUENCY  PT (By licensed PT),OT (By licensed OT),Speech therapy     PT Frequency: 5x week OT Frequency: 5x week     Speech Therapy Frequency: 1x week      Contractures Contractures Info: Not present    Additional Factors Info  Code Status,Allergies,Insulin Sliding Scale Code Status Info: full Allergies Info: NKA   Insulin Sliding Scale Info: Novolog. 0-9 units 3x day with meals.  See discharge summary  Current Medications (01/06/2021):  This is the current hospital active medication list Current Facility-Administered Medications  Medication Dose Route Frequency Provider Last Rate Last Admin  . 0.9 %  sodium chloride infusion   Intravenous Continuous Bonnielee Haff, MD 100 mL/hr at  01/06/21 1113 New Bag at 01/06/21 1113  . acetaminophen (TYLENOL) tablet 650 mg  650 mg Oral Q6H PRN [name], MD   650 mg at 01/03/21 [id number]   Or  . acetaminophen (TYLENOL) suppository 650 mg  650 mg Rectal Q6H PRN [name], MD      . albumin human 25 % solution 25 g  25 g Intravenous Q6H Bonnielee Haff, MD 60 mL/hr at 01/06/21 1111 25 g at 01/06/21 1111  . albuterol (PROVENTIL) (2.5 MG/3ML) 0.083% nebulizer solution 2.5 mg  2.5 mg Nebulization Q4H PRN Raiford Noble Latif, DO       Or  . albuterol (VENTOLIN HFA) 108 (90 Base) MCG/ACT inhaler 2 puff  2 puff Inhalation Q4H PRN Raiford Noble Grover, DO   2 puff at 12/30/20 0745  . alum & mag hydroxide-simeth (MAALOX/MYLANTA) 200-200-20 MG/5ML suspension 30 mL  30 mL Oral Q4H PRN [name], MD      . amoxicillin-clavulanate (AUGMENTIN) 500-125 MG per tablet 500 mg  1 tablet Oral Q12H Karren Cobble, RPH      . apixaban (ELIQUIS) tablet 5 mg  5 mg Oral BID Joselyn Glassman A, RPH   5 mg at 01/06/21 0818  . buPROPion (WELLBUTRIN XL) 24 hr tablet 150 mg  150 mg Oral q morning Bonnielee Haff, MD   150 mg at 01/06/21 [id number]  . clotrimazole [facility]) troche 10 mg  10 mg Oral 5 X Daily Hammons, Kimberly B, RPH   10 mg at 01/06/21 1418  . famotidine (PEPCID) tablet 20 mg  20 mg Oral QHS [name], MD   20 mg at 01/05/21 2211  . feeding supplement (ENSURE ENLIVE / ENSURE PLUS) liquid 237 mL  237 mL Oral TID BM Caren Griffins, MD   237 mL at 01/06/21 1421  . FLUoxetine (PROZAC) capsule 20 mg  20 mg Oral Daily Bonnielee Haff, MD   20 mg at 01/06/21 0819  . insulin aspart (novoLOG) injection 0-9 Units  0-9 Units Subcutaneous TID WC [name], MD   1 Units at 01/05/21 1327  . lactulose (CHRONULAC) 10 GM/15ML solution 10 g  10 g Oral BID Bonnielee Haff, MD      . midodrine (PROAMATINE) tablet 15 mg  15 mg Oral TID WC Gifford Shave, MD   15 mg at 01/06/21 1237  . multivitamin with minerals tablet 1 tablet  1  tablet Oral Daily Caren Griffins, MD   1 tablet at 01/06/21 0818  . oxyCODONE (Oxy IR/ROXICODONE) immediate release tablet 5 mg  5 mg Oral Q4H PRN [name], MD   5 mg at 01/06/21 [id number]  . pantoprazole (PROTONIX) EC tablet 40 mg  40 mg Oral q morning [name], MD   40 mg at 01/06/21 0818  . prochlorperazine (COMPAZINE) injection 5 mg  5 mg Intravenous Q4H PRN [name], MD   5 mg at 01/02/21 1252  . saccharomyces boulardii (FLORASTOR) capsule 250 mg  250 mg Oral BID Bonnielee Haff, MD   250 mg at 01/06/21 0818  . sodium bicarbonate tablet 1,300 mg  1,300 mg Oral BID Rosita Fire, MD   1,300 mg at 01/06/21 [phone]  Discharge Medications: Please see discharge summary for a list of discharge medications.  Relevant Imaging Results:  Relevant Lab Results:   Additional Information SSN [id number]  Joanne Chars, LCSW

## 2021-01-06 NOTE — Progress Notes (Signed)
   RE:  Lori Cooper       Date of Birth: 01/09/1956      Date:  01/06/21        To Whom It May Concern:  Please be advised that the above-named patient will require a short-term nursing home stay - anticipated 30 days or less for rehabilitation and strengthening.  The plan is for return home.                 MD signature                Date

## 2021-01-07 DIAGNOSIS — J9601 Acute respiratory failure with hypoxia: Secondary | ICD-10-CM | POA: Diagnosis not present

## 2021-01-07 DIAGNOSIS — J690 Pneumonitis due to inhalation of food and vomit: Secondary | ICD-10-CM | POA: Diagnosis not present

## 2021-01-07 DIAGNOSIS — K7460 Unspecified cirrhosis of liver: Secondary | ICD-10-CM | POA: Diagnosis not present

## 2021-01-07 DIAGNOSIS — N179 Acute kidney failure, unspecified: Secondary | ICD-10-CM | POA: Diagnosis not present

## 2021-01-07 LAB — CBC
HCT: 20.7 % — ABNORMAL LOW (ref 36.0–46.0)
Hemoglobin: 7.6 g/dL — ABNORMAL LOW (ref 12.0–15.0)
MCH: 29.5 pg (ref 26.0–34.0)
MCHC: 36.7 g/dL — ABNORMAL HIGH (ref 30.0–36.0)
MCV: 80.2 fL (ref 80.0–100.0)
Platelets: 190 10*3/uL (ref 150–400)
RBC: 2.58 MIL/uL — ABNORMAL LOW (ref 3.87–5.11)
RDW: 28.8 % — ABNORMAL HIGH (ref 11.5–15.5)
WBC: 11.4 10*3/uL — ABNORMAL HIGH (ref 4.0–10.5)
nRBC: 0 % (ref 0.0–0.2)

## 2021-01-07 LAB — COMPREHENSIVE METABOLIC PANEL
ALT: 55 U/L — ABNORMAL HIGH (ref 0–44)
AST: 192 U/L — ABNORMAL HIGH (ref 15–41)
Albumin: 3 g/dL — ABNORMAL LOW (ref 3.5–5.0)
Alkaline Phosphatase: 118 U/L (ref 38–126)
Anion gap: 8 (ref 5–15)
BUN: 27 mg/dL — ABNORMAL HIGH (ref 8–23)
CO2: 20 mmol/L — ABNORMAL LOW (ref 22–32)
Calcium: 8.7 mg/dL — ABNORMAL LOW (ref 8.9–10.3)
Chloride: 106 mmol/L (ref 98–111)
Creatinine, Ser: 1.67 mg/dL — ABNORMAL HIGH (ref 0.44–1.00)
GFR, Estimated: 34 mL/min — ABNORMAL LOW (ref >60)
Glucose, Bld: 115 mg/dL — ABNORMAL HIGH (ref 70–99)
Potassium: 3.6 mmol/L (ref 3.5–5.1)
Sodium: 134 mmol/L — ABNORMAL LOW (ref 135–145)
Total Bilirubin: 5.2 mg/dL — ABNORMAL HIGH (ref 0.3–1.2)
Total Protein: 5.6 g/dL — ABNORMAL LOW (ref 6.5–8.1)

## 2021-01-07 LAB — GLUCOSE, CAPILLARY
Glucose-Capillary: 112 mg/dL — ABNORMAL HIGH (ref 70–99)
Glucose-Capillary: 91 mg/dL (ref 70–99)
Glucose-Capillary: 112 mg/dL — ABNORMAL HIGH (ref 70–99)
Glucose-Capillary: 100 mg/dL — ABNORMAL HIGH (ref 70–99)
Glucose-Capillary: 99 mg/dL (ref 70–99)
Glucose-Capillary: 90 mg/dL (ref 70–99)

## 2021-01-07 LAB — AMMONIA: Ammonia: 41 umol/L — ABNORMAL HIGH (ref 9–35)

## 2021-01-07 MED ORDER — ALBUMIN HUMAN 25 % IV SOLN
25.0000 g | Freq: Four times a day (QID) | INTRAVENOUS | Status: AC
  Administered 2021-01-07 (×3): 25 g via INTRAVENOUS
  Filled 2021-01-07 (×3): qty 100

## 2021-01-07 NOTE — TOC Progression Note (Addendum)
Transition of Care [facility]) - Progression Note    Patient Details  Name: Lori Cooper MRN: [id number] Date of Birth: 07/13/1956  [facility]) CM/SW Contact  Joanne Chars, LCSW Phone Number: 01/07/2021, 11:28 AM  Clinical Narrative:   SNF auth request made to [place] at [facility].  Level 2 PASSR received: [id number]  1400: CSW spoke with husband Lori Cooper about current bed offers: [facility], [facility], [facility].  [facility] only other [facility] location that has not responded.  Husband wants to speak with pt daughter and will call back to confirm choice.  1520: CSW spoke with pt husband Lori Cooper who would like to accept offer at [place] spoke with Mardene Celeste at [facility] to confirm acceptance with possible transfer tomorrow or shortly thereafter.   1600: CSW received all back from [place] [facility].  She spoke with pt husband and he was talking about long term care.  Mardene Celeste has a rehab bed but not a long term bed. Pt does not have LTC payor source.  CSW attempted to call husband back but he reported he was on the phone with his insurance company and would need to call CSW back.        Barriers to Discharge: Continued Medical Work up  Expected Discharge Plan and Services           Expected Discharge Date: 12/12/20                         HH Arranged: PT,OT,RN [place] Date [place]: 01/02/21 Time [place]: 1215 Representative spoke with at [place]: [place] ([place]) Interventions    Readmission Risk Interventions Readmission Risk Prevention Plan 12/12/2020  Transportation Screening Complete  PCP or Specialist Appt within 3-5 Days Complete  HRI or [place] (No Data)  Social Work Consult for [place] Planning/Counseling Complete  Palliative Care Screening Not Applicable  Some recent data might be hidden

## 2021-01-07 NOTE — Plan of Care (Signed)
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
## 2021-01-07 NOTE — Progress Notes (Signed)
9:45 am: went to give pt. Morning meds and pt. Was very confused, pt. Kept looking over in other directions stating there was someone in the bed, pt. Was redirected. Pt. Refused all morning meds, medications were crushed and put in applesauce and pt. Spit them out, stating, "I can't take them." It was informed to the pt. That these medications are very important, pt. Kept shaking her head no. MD notified.   10:00am: pt.'s daughter called wanting an update. Update given. Daughter expresses that she wants to talk to MD. MD notified.   1:34pm: Pt. Refused medications. Pt. Asked me to crush meds and mix in milk, this was done and pt. Spit them out, stating, "I can't do it." MD notified.  2:45pm: Pt's husband calls very upset crying on the phone stating that Pt. Is going to be discharged to [facility] rehab. He states that this is a 4 star facility and sending her is killing him. Then he hangs up.   3:30pm: pt's sister calls very upset stating that husband called her and said pt. Is being sent to [facility] [place] to die. I informed her that this is not the case, that there must be some kind of misunderstanding. Pt.'s sister asks to speak to MD and states that she is pt.'s POA and she does not want pt. To go to rehab. It was explained that there is no POA forms on file and they are needed in order to make these kinds of decisions. She states she will bring them either today or tomorrow. MD notified.   4:37pm: Pt.'s daughter calls again wanting information in regards to rehab and discharge. It is advised to the daughter To discuss this with MD when she comes to visit pt. Tomorrow. Daughter agrees and says she will do that.

## 2021-01-08 ENCOUNTER — Inpatient Hospital Stay (HOSPITAL_COMMUNITY): Payer: PPO

## 2021-01-08 DIAGNOSIS — K767 Hepatorenal syndrome: Secondary | ICD-10-CM

## 2021-01-08 DIAGNOSIS — Z7189 Other specified counseling: Secondary | ICD-10-CM

## 2021-01-08 DIAGNOSIS — D638 Anemia in other chronic diseases classified elsewhere: Secondary | ICD-10-CM

## 2021-01-08 DIAGNOSIS — R5381 Other malaise: Secondary | ICD-10-CM

## 2021-01-08 DIAGNOSIS — I959 Hypotension, unspecified: Secondary | ICD-10-CM

## 2021-01-08 DIAGNOSIS — E872 Acidosis: Secondary | ICD-10-CM

## 2021-01-08 DIAGNOSIS — D72825 Bandemia: Secondary | ICD-10-CM

## 2021-01-08 LAB — GLUCOSE, CAPILLARY
Glucose-Capillary: 104 mg/dL — ABNORMAL HIGH (ref 70–99)
Glucose-Capillary: 109 mg/dL — ABNORMAL HIGH (ref 70–99)
Glucose-Capillary: 106 mg/dL — ABNORMAL HIGH (ref 70–99)
Glucose-Capillary: 98 mg/dL (ref 70–99)
Glucose-Capillary: 88 mg/dL (ref 70–99)

## 2021-01-08 LAB — CBC WITH DIFFERENTIAL/PLATELET
Abs Immature Granulocytes: 0.09 10*3/uL — ABNORMAL HIGH (ref 0.00–0.07)
Basophils Absolute: 0.1 10*3/uL (ref 0.0–0.1)
Basophils Relative: 0 %
Eosinophils Absolute: 0.2 10*3/uL (ref 0.0–0.5)
Eosinophils Relative: 1 %
HCT: 23.8 % — ABNORMAL LOW (ref 36.0–46.0)
Hemoglobin: 8.7 g/dL — ABNORMAL LOW (ref 12.0–15.0)
Immature Granulocytes: 1 %
Lymphocytes Relative: 9 %
Lymphs Abs: 1.5 10*3/uL (ref 0.7–4.0)
MCH: 30.1 pg (ref 26.0–34.0)
MCHC: 36.6 g/dL — ABNORMAL HIGH (ref 30.0–36.0)
MCV: 82.4 fL (ref 80.0–100.0)
Monocytes Absolute: 0.9 10*3/uL (ref 0.1–1.0)
Monocytes Relative: 5 %
Neutro Abs: 13.9 10*3/uL — ABNORMAL HIGH (ref 1.7–7.7)
Neutrophils Relative %: 84 %
Platelets: 181 10*3/uL (ref 150–400)
RBC: 2.89 MIL/uL — ABNORMAL LOW (ref 3.87–5.11)
RDW: 30.4 % — ABNORMAL HIGH (ref 11.5–15.5)
WBC: 16.6 10*3/uL — ABNORMAL HIGH (ref 4.0–10.5)
nRBC: 0 % (ref 0.0–0.2)

## 2021-01-08 LAB — COMPREHENSIVE METABOLIC PANEL
ALT: 51 U/L — ABNORMAL HIGH (ref 0–44)
AST: 176 U/L — ABNORMAL HIGH (ref 15–41)
Albumin: 3.2 g/dL — ABNORMAL LOW (ref 3.5–5.0)
Alkaline Phosphatase: 119 U/L (ref 38–126)
Anion gap: 12 (ref 5–15)
BUN: 20 mg/dL (ref 8–23)
CO2: 16 mmol/L — ABNORMAL LOW (ref 22–32)
Calcium: 8.8 mg/dL — ABNORMAL LOW (ref 8.9–10.3)
Chloride: 108 mmol/L (ref 98–111)
Creatinine, Ser: 1.32 mg/dL — ABNORMAL HIGH (ref 0.44–1.00)
GFR, Estimated: 45 mL/min — ABNORMAL LOW (ref >60)
Glucose, Bld: 115 mg/dL — ABNORMAL HIGH (ref 70–99)
Potassium: 3.9 mmol/L (ref 3.5–5.1)
Sodium: 136 mmol/L (ref 135–145)
Total Bilirubin: 5.4 mg/dL — ABNORMAL HIGH (ref 0.3–1.2)
Total Protein: 5.9 g/dL — ABNORMAL LOW (ref 6.5–8.1)

## 2021-01-08 LAB — AMMONIA
Ammonia: 35 umol/L (ref 9–35)
Ammonia: 51 umol/L — ABNORMAL HIGH (ref 9–35)

## 2021-01-08 LAB — PHOSPHORUS: Phosphorus: 2.4 mg/dL — ABNORMAL LOW (ref 2.5–4.6)

## 2021-01-08 LAB — MAGNESIUM: Magnesium: 1.7 mg/dL (ref 1.7–2.4)

## 2021-01-08 LAB — HEMOGLOBIN AND HEMATOCRIT, BLOOD
HCT: 24.8 % — ABNORMAL LOW (ref 36.0–46.0)
Hemoglobin: 8.7 g/dL — ABNORMAL LOW (ref 12.0–15.0)

## 2021-01-08 NOTE — TOC Progression Note (Signed)
Transition of Care [facility]) - Progression Note    Patient Details  Name: Lori Cooper MRN: [id number] Date of Birth: 1955/12/21  Transition of Care [facility]) CM/SW Contact  Angelita Ingles, RN Phone Number: [phone]  01/08/2021, 2:37 PM  Clinical Narrative:    Length of Need Lifetime  The above medical condition requires: Patient requires the ability to reposition frequently  Head must be elevated greater than: 30 degrees  Bed type Semi-electric  Trapeze Bar Yes  Support Surface: Gel Overlay        Barriers to Discharge: Continued Medical Work up  Expected Discharge Plan and Services       Post Acute Care Choice: [place] arrangements for the past 2 months: Single Family Home Expected Discharge Date: 01/08/21                         HH Arranged: PT,OT,RN [place] Date [place]: 01/02/21 Time [place]: 1215 Representative spoke with at [place]: [place] ([place]) Interventions    Readmission Risk Interventions Readmission Risk Prevention Plan 12/12/2020  Transportation Screening Complete  PCP or Specialist Appt within 3-5 Days Complete  HRI or [place] (No Data)  Social Work Consult for [place] Planning/Counseling Complete  Palliative Care Screening Not Applicable  Some recent data might be hidden

## 2021-01-08 NOTE — Progress Notes (Signed)
Jumped up out of bed. Bed alarm going off. Confused to place/time. Alert to person only. Keeps saying,"I want to go home." Skin pale. On 2L [place] sats 90%, HR 87 spot checked. Notified Hospitalist on call. See new orders.

## 2021-01-08 NOTE — Progress Notes (Signed)
OT Cancellation Note  Patient Details Name: Lori Cooper MRN: [id number] DOB: 05/03/1956   Cancelled Treatment:    Reason Eval/Treat Not Completed: Fatigue/lethargy limiting ability to participate;Other (comment). On entry, pt asleep in bed, did not rouse to voice. Daughter requests OT to come back later if possible to allow her to rest. Per daughter, pt has gotten up to bathroom and sat in chair earlier. Will follow-up for OT session again as time allows.   Layla Maw 01/08/2021, 2:27 PM
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
## 2021-01-08 NOTE — Progress Notes (Signed)
   01/08/21 1538  What Happened  Was fall witnessed? No  Was patient injured? No  Patient found on floor  Found by Staff-comment  Stated prior activity bathroom-unassisted  Follow Up  MD notified Cyndia Skeeters  Time MD notified 1531  Family notified Yes - comment Desma Maxim)  Time family notified 1614  Additional tests No  Simple treatment Other (comment) (rest)  Progress note created (see row info) Yes  Adult Fall Risk Assessment  Risk Factor Category (scoring not indicated) Fall has occurred during this admission (document High fall risk)  Patient Fall Risk Level High fall risk  Adult Fall Risk Interventions  Required Bundle Interventions *See Row Information* High fall risk - low, moderate, and high requirements implemented  Additional Interventions Use of appropriate toileting equipment (bedpan, BSC, etc.)  Screening for Fall Injury Risk (To be completed on HIGH fall risk patients) - Assessing Need for Floor Mats  Risk For Fall Injury- Criteria for Floor Mats Noncompliant with safety precautions  Will Implement Floor Mats Yes  Vitals  Temp 97.9 F (36.6 C)  BP (!) 88/61  MAP (mmHg) 68  BP Location Left Arm  BP Method Automatic  Patient Position (if appropriate) Sitting  Pulse Rate 88  Pulse Rate Source Monitor  Resp 15  Oxygen Therapy  SpO2 93 %  Pain Assessment  Pain Scale 0-10  Pain Score 0  Neurological  Neuro (WDL) X  Level of Consciousness Alert  Orientation Level Oriented to person;Oriented to place;Oriented to time;Disoriented to situation  Speech Clear  R Pupil Size (mm) 2  L Pupil Size (mm) 2  RUE Motor Response Purposeful movement  LUE Motor Response Purposeful movement  RLE Motor Response Purposeful movement  LLE Motor Response Purposeful movement  Glasgow Coma Scale  Eye Opening 4  Best Verbal Response (NON-intubated) 4  Best Motor Response 6  Glasgow Coma Scale Score 14  Musculoskeletal  Musculoskeletal (WDL) X  Assistive Device BSC   Generalized Weakness Yes  Integumentary  Integumentary (WDL) X  Skin Color Appropriate for ethnicity  Skin Condition Dry  Skin Integrity Ecchymosis  Ecchymosis Location Arm;Leg  Ecchymosis Location Orientation Bilateral   Patient experienced unwitnessed fall while attempting to take self to the [facility] Patient found sitting on the floor. Patient with no signs of injury or trauma. Denies pain at this time. Vital signs remain stable for patient. MD notified and at bedside to evaluate patient. Patient's daughter, [name], notified of fall occurrence. Floor mats to be implemented. Patient will continue to be evaluated for any changes associated with fall. Patient returned to bed with assistance of staff.

## 2021-01-08 NOTE — Progress Notes (Signed)
Patient would not allow this nurse to perform head to toe assessment this morning stating "do not touch me" when I asked patient if I could examine her. Patient did allow this nurse to inspect old pleur-x site. Patient only took a few scheduled medications, [facility] MD at bedside and aware.

## 2021-01-08 NOTE — Progress Notes (Addendum)
      [address]       [place] [address]             [phone]       Called to the bedside this afternoon to evaluate the patient's pleurx catheter. When I arrived, the pleurx was out. The patient fell while getting up to use the bathroom. Unsure when the pleurx catheter was removed but after discussing with nursing management it was sometime Monday evening.  We were not called before today about the tube being out. To her knowledge she did not hit her head during the fall. Nursing is aware. ABD dressing and tape in place over pleurx catheter hole. Apply dressings as needed, hopefully drainage will slow down. CXR in the morning.   Plan: Discussed incident with Dr. Servando Snare. Will leave the pleurx out for now but if fluid re-accumulates then call IR for replacement of pleurx catheter. CXR in the morning.
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
## 2021-01-08 NOTE — Progress Notes (Signed)
SLP Cancellation Note  Patient Details Name: Lori Cooper MRN: [id number] DOB: 1956/10/07   Cancelled treatment:       Reason Eval/Treat Not Completed: Patient declined, no reason specified. Patient declining to participate. When SLP asked her if she had breakfast this morning, she asked, "Is this necessary? We signed the paperwork to go home". Per RN, patient seems to be tolerating PO's without coughing, throat clearing, however her intake remains limited.  Sonia Baller, MA, CCC-SLP Speech Therapy [facility]

## 2021-01-08 NOTE — Progress Notes (Signed)
   01/08/21 1100  Clinical Encounter Type  Visited With Patient and family together  Visit Type Initial  Referral From Nurse  Consult/Referral To Chaplain  The chaplain responded nurse, Caryl Pina stating the patient is requesting to complete an Advanced Directive today. The patient's daughter, Desma Maxim is at bedside. Chaplain provided education. Chaplain returned later with 59M Director, Maudie Mercury to notarize. This note was prepared by Jeanine Luz, M.Div..  For questions please contact by phone [phone].

## 2021-01-08 NOTE — Care Management Important Message (Signed)
Important Message  Patient Details  Name: Lori Cooper MRN: [id number] Date of Birth: 1956/08/26   Medicare Important Message Given:  Yes     Megan P [place] 01/08/2021, 2:40 PM

## 2021-01-08 NOTE — TOC Progression Note (Addendum)
Transition of Care [facility]) - Progression Note    Patient Details  Name: Lori Cooper MRN: [id number] Date of Birth: 1955-12-06  Transition of Care [facility]) CM/SW Contact  Joanne Chars, LCSW Phone Number: 01/08/2021, 8:27 AM  Clinical Narrative:  CSW received phone call from [facility]TA.  Pt has been denied auth for SNF because pt is too acute and SNF level of care could not meet her needs. Peer to peer offered with Dr Amalia Hailey, [phone].  Must be completed by end of day today. MD informed.  Ambulance transport was authorized, [id number].  CSW spoke with MD who reports that pt daughter arrived and pt has engaged in conversation, made decision to appoint daughter as POA and they want to change the plan back to home with [facility].  Significant DME needs now-hospital bed, wheelchair. These were ordered through [employer].    CSW confirmed with Amy at [facility] that they will provide services and updated her that pleurx catheter no longer involved.        Barriers to Discharge: Continued Medical Work up  Expected Discharge Plan and Services       Post Acute Care Choice: [place] arrangements for the past 2 months: Single Family Home Expected Discharge Date: 12/12/20                         HH Arranged: PT,OT,RN [place] Date [place]: 01/02/21 Time [place]: 1215 Representative spoke with at [place]: [place] ([place]) Interventions    Readmission Risk Interventions Readmission Risk Prevention Plan 12/12/2020  Transportation Screening Complete  PCP or Specialist Appt within 3-5 Days Complete  HRI or [place] (No Data)  Social Work Consult for [place] Planning/Counseling Complete  Palliative Care Screening Not Applicable  Some recent data might be hidden

## 2021-01-09 ENCOUNTER — Inpatient Hospital Stay (HOSPITAL_COMMUNITY): Payer: PPO

## 2021-01-09 DIAGNOSIS — R410 Disorientation, unspecified: Secondary | ICD-10-CM

## 2021-01-09 LAB — COMPREHENSIVE METABOLIC PANEL
ALT: 51 U/L — ABNORMAL HIGH (ref 0–44)
AST: 193 U/L — ABNORMAL HIGH (ref 15–41)
Albumin: 2.8 g/dL — ABNORMAL LOW (ref 3.5–5.0)
Alkaline Phosphatase: 107 U/L (ref 38–126)
Anion gap: 12 (ref 5–15)
BUN: 20 mg/dL (ref 8–23)
CO2: 13 mmol/L — ABNORMAL LOW (ref 22–32)
Calcium: 8.7 mg/dL — ABNORMAL LOW (ref 8.9–10.3)
Chloride: 109 mmol/L (ref 98–111)
Creatinine, Ser: 1.3 mg/dL — ABNORMAL HIGH (ref 0.44–1.00)
GFR, Estimated: 45 mL/min — ABNORMAL LOW (ref >60)
Glucose, Bld: 104 mg/dL — ABNORMAL HIGH (ref 70–99)
Potassium: 3.9 mmol/L (ref 3.5–5.1)
Sodium: 134 mmol/L — ABNORMAL LOW (ref 135–145)
Total Bilirubin: 4.8 mg/dL — ABNORMAL HIGH (ref 0.3–1.2)
Total Protein: 5.2 g/dL — ABNORMAL LOW (ref 6.5–8.1)

## 2021-01-09 LAB — GLUCOSE, CAPILLARY
Glucose-Capillary: 112 mg/dL — ABNORMAL HIGH (ref 70–99)
Glucose-Capillary: 68 mg/dL — ABNORMAL LOW (ref 70–99)
Glucose-Capillary: 76 mg/dL (ref 70–99)
Glucose-Capillary: 83 mg/dL (ref 70–99)
Glucose-Capillary: 86 mg/dL (ref 70–99)
Glucose-Capillary: 93 mg/dL (ref 70–99)
Glucose-Capillary: 96 mg/dL (ref 70–99)

## 2021-01-09 LAB — CBC WITH DIFFERENTIAL/PLATELET
Abs Immature Granulocytes: 0.07 10*3/uL (ref 0.00–0.07)
Basophils Absolute: 0.1 10*3/uL (ref 0.0–0.1)
Basophils Relative: 1 %
Eosinophils Absolute: 0.4 10*3/uL (ref 0.0–0.5)
Eosinophils Relative: 3 %
HCT: 23.5 % — ABNORMAL LOW (ref 36.0–46.0)
Hemoglobin: 8.1 g/dL — ABNORMAL LOW (ref 12.0–15.0)
Immature Granulocytes: 1 %
Lymphocytes Relative: 14 %
Lymphs Abs: 2.1 10*3/uL (ref 0.7–4.0)
MCH: 28.7 pg (ref 26.0–34.0)
MCHC: 34.5 g/dL (ref 30.0–36.0)
MCV: 83.3 fL (ref 80.0–100.0)
Monocytes Absolute: 0.9 10*3/uL (ref 0.1–1.0)
Monocytes Relative: 6 %
Neutro Abs: 11.6 10*3/uL — ABNORMAL HIGH (ref 1.7–7.7)
Neutrophils Relative %: 75 %
Platelets: 191 10*3/uL (ref 150–400)
RBC: 2.82 MIL/uL — ABNORMAL LOW (ref 3.87–5.11)
RDW: 30.9 % — ABNORMAL HIGH (ref 11.5–15.5)
WBC: 15.2 10*3/uL — ABNORMAL HIGH (ref 4.0–10.5)
nRBC: 0 % (ref 0.0–0.2)

## 2021-01-09 LAB — MAGNESIUM: Magnesium: 1.7 mg/dL (ref 1.7–2.4)

## 2021-01-09 MED ORDER — LORAZEPAM 2 MG/ML IJ SOLN
1.0000 mg | Freq: Once | INTRAMUSCULAR | Status: AC
  Administered 2021-01-09: 1 mg via INTRAVENOUS

## 2021-01-09 MED ORDER — LACTULOSE 10 GM/15ML PO SOLN
30.0000 g | Freq: Two times a day (BID) | ORAL | Status: DC
  Administered 2021-01-09 – 2021-01-10 (×3): 30 g via ORAL
  Filled 2021-01-09 (×3): qty 45

## 2021-01-09 MED ORDER — LORAZEPAM 2 MG/ML IJ SOLN
INTRAMUSCULAR | Status: AC
  Filled 2021-01-09: qty 1

## 2021-01-09 MED ORDER — LACTATED RINGERS IV BOLUS
1000.0000 mL | Freq: Once | INTRAVENOUS | Status: AC
  Administered 2021-01-09: 1000 mL via INTRAVENOUS

## 2021-01-09 NOTE — Progress Notes (Addendum)
PT Cancellation Note  Patient Details Name: Lori Cooper MRN: [id number] DOB: [birth year]   Cancelled Treatment:    Reason Eval/Treat Not Completed: Medical issues which prohibited therapy. IV ativan given overnight due to agitation. RN advised/requested not to wake pt this morning. PT to re-attempt as time allows.  Addendum 1128: Pt with continued agitation. Declining all care. Aggressive with staff.    Lorriane Shire 01/09/2021, 8:31 AM   Lorrin Goodell, PT  Office # [phone] Pager [phone]

## 2021-01-09 NOTE — Progress Notes (Addendum)
Pt Missing dose of midodrine due to refusing meds.The patient pt daughter requested to ask, provider if can receive the dose since she is present. Provider Chot[place],MD stated is ok to give the med in the order list due pt Bp is low 93/45.

## 2021-01-09 NOTE — Progress Notes (Signed)
Patient refusing to let vital signs be taken. Patient repeatedly rips off blood pressure cuff and attempts to kick and hit staff when staff get close enough to replace it. Attempted to explain to patient why we need to check her vital signs to which she replied "don't you touch me you don't need to do that." Attempted to re-dress and cover patient as she removed her gown and blankets, but patient again yells not to touch her and begins swinging her arms and legs at staff.
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
## 2021-01-09 NOTE — Progress Notes (Signed)
Patient remains agitated this morning when awoken. Patient allowed for minimal physical assessment as per flowsheet and would not answer orientation questions. Patient unwilling to take oral medications at this time. Attempted to reorient and educate patient on importance of her medications to which patient responded "go away." Patient resting in bed in no apparent distress upon exiting room. High Fall Risk precautions remain in place.

## 2021-01-09 NOTE — Progress Notes (Signed)
Patient repeatedly removing purewick and clothing/blankets. Bed linens changed and hygiene care provided with assist of 3 other care providers. Patient dressed, covered, and resting with eyes closed upon exiting room.

## 2021-01-09 NOTE — TOC Progression Note (Signed)
Transition of Care [facility]) - Progression Note    Patient Details  Name: OMELIA VERMILYEA MRN: [id number] Date of Birth: [birth year]  [facility] CM/SW Contact  Joanne Chars, LCSW Phone Number: 01/09/2021, 4:15 PM  Clinical Narrative:    Pt DME is being delivered today.   CSW received call from pt husband who was not aware of the change in plans from SNF to [facility] with daughter.  CSW updated him on daughter being named POA.  Husband upset, said he does not think this is the right plan, but handled this news OK. CSW received call from pt daughter, who has been waiting all day for DME, supposed to arrive shortly.  Informed her that discharge delayed.  Daughter can be here tomorrow AM as pt still needs stitch in drain site and daughter's presence will help with pt while this occurs.  Daughter upset about someone on night staff hanging up on her after 1am last night when daughter called with some concerns about pt blood pressure.  Sam Burt Knack informed and has been attempting to contact daughter today.      Barriers to Discharge: Continued Medical Work up  Expected Discharge Plan and Services       Post Acute Care Choice: [place] arrangements for the past 2 months: Single Family Home Expected Discharge Date: 01/08/21                         HH Arranged: PT,OT,RN [place] Date [place]: 01/02/21 Time [place]: 1215 Representative spoke with at [place]: [place] ([place]) Interventions    Readmission Risk Interventions Readmission Risk Prevention Plan 01/09/2021 12/12/2020  Transportation Screening (No Data) Complete  PCP or Specialist Appt within 3-5 Days - Complete  HRI or [place] - (No Data)  Social Work Consult for [place] Planning/Counseling - Complete  [place] - Not Applicable  PCP or Specialist appointment within 3-5 days of discharge  Complete -  [place] or Home Care Consult Complete -  SW Recovery Care/Counseling Consult Complete -  Palliative Care Screening Not Applicable -  Skilled Nursing Facility Complete -  Some recent data might be hidden

## 2021-01-09 NOTE — Progress Notes (Signed)
Notified MD on call of bp 95/43 map 25 and of increased drainage/fluid from old Pleur-x site. Dressing was just changed about an hour ago and dressing is saturated and gown had to be changed as well.
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
## 2021-01-09 NOTE — Progress Notes (Addendum)
      [address]       ,[place] [address]             [phone]       Changed her dressing this morning with much difficultly. Patient is very unhappy this morning and continues to state that she is cold. I was able to visualize the pleurx catheter hole and I do believe it needs a stitch. Patient was combative all night and her behavior is limiting care according to nursing. I can circle back later today and if patient is willing I will place the stitch. At this moment placing a stitch will escalate her behavior.   Site is clean without erythema or concern for infection. Fluid draining is clear and straw colored, typical pleural fluid. CXR reviewed and there is no concern for pleural effusion. Report listed below. If effusion does re-accumulate we would recommend IR placing another pleurx catheter.   FINDINGS: Heart size stable. Persistent bilateral interstitial prominence, no change from prior exam. No pleural effusion or pneumothorax. Surgical clips right upper quadrant.  IMPRESSION: 1. Persistent bilateral interstitial prominence, no change from prior exam.  2.  No evidence of pneumothorax following recent chest tube removal.   Nicholes Rough, PA-C  nh3 level up slightly 53 yesterday No cooperative, given iv sedation during night  With chest xray ok no significant increase in efffusion and patient not cooperative would no replace pleurix at this point, if increasing recurrent right pleural effusion on follow up xray consider ir placement of pleurix  I have seen and examined Glo Herring and agree with the above assessment  and plan.  [name] MD Beeper [phone] Office [phone] 01/09/2021 8:38 AM

## 2021-01-09 NOTE — Progress Notes (Signed)
Lying in bed w/ eyes closed. Alert to person and birthday. NT at bedside to obtain another set of VS . Bed alarm remains engaged. She has not used her call light to call out at all tonight. All staff is aware of impulsive behavior. She was not steady to get up to walk when she attempted to get out of the bed earlier and she had already been incontinent of bowel and urine by the time her bed alarm went off.  Daughter was updated about confusion and how the night had been going. Per daughter the patient is more alert during the day and lucid. That has not been how her mental status has been upon the start of this shift. Was also reported from day shift nurse of the confusion and angry outburst and fall from day shift .

## 2021-01-09 NOTE — Progress Notes (Signed)
Patient's linens wet from pleur-x drainage. Patient uncooperative with linen change, kicking and hitting at staff. Eventually patient calmed down enough for linens to be changed with assist of care providers x3. Drainage collection bag placed around site to attempt to accurately measure output.

## 2021-01-09 NOTE — Plan of Care (Signed)

## 2021-01-09 NOTE — Progress Notes (Signed)
OT Cancellation Note  Patient Details Name: Lori Cooper MRN: [id number] DOB: [birth year]   Cancelled Treatment:    Reason Eval/Treat Not Completed: Other (comment) Per staff, pt agitated, combative and aggressive towards staff. Will hold on therapy at this time.  Layla Maw 01/09/2021, 1:15 PM

## 2021-01-09 NOTE — Progress Notes (Signed)
IVf bolus started at ordered rated. Spoke to Dr. Tonie Griffith on the phone. See new order. At bedside. Continuing to monitor. Call light in reach. Bed in low position and Bed alarm engaged.

## 2021-01-09 NOTE — Progress Notes (Signed)
Attempting to get up out of bed. Bed alarm going off. Incontinent of bowel and urine and unable to walk w/ assist to bsc due to weakness at this time. Attempt to assist her back to bed and change her dressing to rt back flank area. She began yelling, "bitch, leave me alone." Hit this nurse and grabbed my hands w/ increased strength and would not release her grip. Contacted Hospitalist on call . See new orders.  Additional staff came to her room to assist her and change linens and dressing. Restraints not applied at this time. IV ativan given per order and po pain med per order . She had become  tearful and cooperative prior to ativan administration , and she is now lying in bed w/ eyes closed. Bed alarm is engaged on sensitive setting. Floor mats on both sides of beds in place. Bed in lowest position.

## 2021-01-10 ENCOUNTER — Telehealth: Payer: Self-pay | Admitting: *Deleted

## 2021-01-10 ENCOUNTER — Other Ambulatory Visit: Payer: Self-pay | Admitting: Student

## 2021-01-10 DIAGNOSIS — K7200 Acute and subacute hepatic failure without coma: Secondary | ICD-10-CM

## 2021-01-10 DIAGNOSIS — R451 Restlessness and agitation: Secondary | ICD-10-CM

## 2021-01-10 DIAGNOSIS — K7460 Unspecified cirrhosis of liver: Secondary | ICD-10-CM

## 2021-01-10 DIAGNOSIS — K7290 Hepatic failure, unspecified without coma: Secondary | ICD-10-CM

## 2021-01-10 DIAGNOSIS — Z515 Encounter for palliative care: Secondary | ICD-10-CM

## 2021-01-10 DIAGNOSIS — K7682 Hepatic encephalopathy: Secondary | ICD-10-CM

## 2021-01-10 LAB — COMPREHENSIVE METABOLIC PANEL
ALT: 65 U/L — ABNORMAL HIGH (ref 0–44)
AST: 242 U/L — ABNORMAL HIGH (ref 15–41)
Albumin: 2.9 g/dL — ABNORMAL LOW (ref 3.5–5.0)
Alkaline Phosphatase: 133 U/L — ABNORMAL HIGH (ref 38–126)
Anion gap: 9 (ref 5–15)
BUN: 21 mg/dL (ref 8–23)
CO2: 19 mmol/L — ABNORMAL LOW (ref 22–32)
Calcium: 8.9 mg/dL (ref 8.9–10.3)
Chloride: 109 mmol/L (ref 98–111)
Creatinine, Ser: 1.39 mg/dL — ABNORMAL HIGH (ref 0.44–1.00)
GFR, Estimated: 42 mL/min — ABNORMAL LOW (ref >60)
Glucose, Bld: 101 mg/dL — ABNORMAL HIGH (ref 70–99)
Potassium: 3.6 mmol/L (ref 3.5–5.1)
Sodium: 137 mmol/L (ref 135–145)
Total Bilirubin: 5.8 mg/dL — ABNORMAL HIGH (ref 0.3–1.2)
Total Protein: 5.7 g/dL — ABNORMAL LOW (ref 6.5–8.1)

## 2021-01-10 LAB — GLUCOSE, CAPILLARY
Glucose-Capillary: 100 mg/dL — ABNORMAL HIGH (ref 70–99)
Glucose-Capillary: 105 mg/dL — ABNORMAL HIGH (ref 70–99)
Glucose-Capillary: 113 mg/dL — ABNORMAL HIGH (ref 70–99)
Glucose-Capillary: 114 mg/dL — ABNORMAL HIGH (ref 70–99)
Glucose-Capillary: 123 mg/dL — ABNORMAL HIGH (ref 70–99)
Glucose-Capillary: 146 mg/dL — ABNORMAL HIGH (ref 70–99)

## 2021-01-10 LAB — CBC
HCT: 25.4 % — ABNORMAL LOW (ref 36.0–46.0)
Hemoglobin: 8.9 g/dL — ABNORMAL LOW (ref 12.0–15.0)
MCH: 29.3 pg (ref 26.0–34.0)
MCHC: 35 g/dL (ref 30.0–36.0)
MCV: 83.6 fL (ref 80.0–100.0)
Platelets: 222 10*3/uL (ref 150–400)
RBC: 3.04 MIL/uL — ABNORMAL LOW (ref 3.87–5.11)
RDW: 31.2 % — ABNORMAL HIGH (ref 11.5–15.5)
WBC: 12.8 10*3/uL — ABNORMAL HIGH (ref 4.0–10.5)
nRBC: 0 % (ref 0.0–0.2)

## 2021-01-10 LAB — PHOSPHORUS: Phosphorus: 3.2 mg/dL (ref 2.5–4.6)

## 2021-01-10 LAB — MAGNESIUM: Magnesium: 1.9 mg/dL (ref 1.7–2.4)

## 2021-01-10 LAB — PROTIME-INR
INR: 4.1 (ref 0.8–1.2)
Prothrombin Time: 38.3 seconds — ABNORMAL HIGH (ref 11.4–15.2)

## 2021-01-10 LAB — AMMONIA: Ammonia: 27 umol/L (ref 9–35)

## 2021-01-10 MED ORDER — LACTULOSE 10 GM/15ML PO SOLN: ORAL | 1 refills | Status: AC

## 2021-01-10 MED ORDER — SODIUM BICARBONATE 650 MG PO TABS
650.0000 mg | ORAL_TABLET | Freq: Three times a day (TID) | ORAL | 1 refills | Status: AC
Start: 2021-01-10 — End: ?

## 2021-01-10 MED ORDER — MIDODRINE HCL 5 MG PO TABS: 15.0000 mg | ORAL_TABLET | Freq: Three times a day (TID) | ORAL | 1 refills | Status: DC

## 2021-01-10 MED ORDER — NYSTATIN 100000 UNIT/ML MT SUSP: 5.0000 mL | Freq: Four times a day (QID) | OROMUCOSAL | 0 refills | Status: DC

## 2021-01-10 MED ORDER — PHYTONADIONE 5 MG PO TABS
10.0000 mg | ORAL_TABLET | Freq: Once | ORAL | Status: AC
  Administered 2021-01-10: 10 mg via ORAL
  Filled 2021-01-10: qty 2

## 2021-01-10 MED FILL — MIDODRINE HCL 5 MG TABS: 5 | 30 days supply | Qty: 90 | Fill #0

## 2021-01-10 MED FILL — SODIUM BICARBONATE 650 MG T: 650 | 30 days supply | Qty: 90 | Fill #0

## 2021-01-10 MED FILL — NYSTATIN 100000 UNIT/ML SUS: 100000 | 3 days supply | Qty: 60 | Fill #0

## 2021-01-10 MED FILL — LACTULOSE 10 GM/15 ML SOLN: 10 | 6 days supply | Qty: 473 | Fill #0

## 2021-01-10 NOTE — Discharge Summary (Signed)
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 hepatic veins. Once the cannula was positioned, 4 separate 20 gauge biopsy were achieved, placed into saline. Final injection was performed. Patient tolerated the procedure well and remained hemodynamically stable throughout. No complications were encountered and no significant blood loss was encountered. FINDINGS: Right atrium: 9/3 (6) Hepatic Vein: 13/7 (10) Wedge Pressure: (29), (26), (28) Calculated portal venous pressure: Range is 16 - 19 mmHg IMPRESSION: Status post image guided access right internal jugular vein for hepatic venogram, pressure measurements, and medical liver biopsy. The series of hepatic vein pressure measurements confirms elevation of the portal pressures. Signed, Dulcy Fanny. Dellia Nims, RPVI V[facility] Inter[facility] Radiology Special[facility]  Radiology Electronically Signed   By: Corrie Mckusick D.O.   On: 12/23/2020 14:06   IR Transcatheter BX  Result Date: 12/23/2020 INDICATION: 65 year old female with possible cirrhosis, possible portal hypertension EXAM: ULTRASOUND-GUIDED ACCESS RIGHT INTERNAL JUGULAR VEIN HEPATIC VENOGRAM WITH HEMODYNAMIC MEASURING TRANSJUGULAR LIVER BIOPSY MEDICATIONS: None. ANESTHESIA/SEDATION: Moderate (conscious) sedation was employed during this procedure. A total of Versed 0.5 mg and Fentanyl 25 mcg was  administered intravenously. Moderate Sedation Time: 49 minutes. The patient's level of consciousness and vital signs were monitored continuously by radiology nursing throughout the procedure under my direct supervision. FLUOROSCOPY TIME:  Fluoroscopy Time: 6 minutes 48 seconds (47 mGy). COMPLICATIONS: None PROCEDURE: Informed written consent was obtained from the patient after a thorough discussion of the procedural risks, benefits and alternatives. All questions were addressed. Maximal Sterile Barrier Technique was utilized including caps, mask, sterile gowns, sterile gloves, sterile drape, hand hygiene and skin antiseptic. A timeout was performed prior to the initiation of the procedure The right neck and chest was prepped with chlorhexidine, and draped in the usual sterile fashion using maximum barrier technique (cap and mask, sterile gown, sterile gloves, large sterile sheet, hand hygiene and cutaneous antiseptic). Local anesthesia was attained by infiltration with 1% lidocaine without epinephrine. Ultrasound demonstrated patency of the right internal jugular vein, and this was documented with an image. Under real-time ultrasound guidance, this vein was accessed with a 21 gauge micropuncture needle and image documentation was performed. A small dermatotomy was made at the access site with an 11 scalpel. A 0.018" wire was advanced into the SVC and the access needle exchanged for a 46F micropuncture vascular sheath. The  0.018" wire was then removed and a 0.035" wire advanced into the IVC. A 9 French sheath was placed over the wire, and a combination of the Bentson wire an angled catheter were used to select the hepatic veins. Position of the catheter was confirmed with small contrast injection. Once the 65 cm Kumpe the catheter was confirmed within distal hepatic veins, the Bentson wire was removed. Catheter was then positioned within the selected hepatic vein for venogram. Free hepatic vein pressure was achieved. The catheter was then wedged in the distal hepatic vein into the parenchyma, gentle contrast was injected confirming location, and a wedge pressure was achieved. Given that this pressure appeared elevated, we then decided to make various measurements with a balloon occlusion catheter. The Bentson wire was advanced into the hepatic vein in the Kumpe the catheter was removed. An over the wire Fogarty balloon was advanced on the wire into the hepatic vein. Once the balloon Fogarty was within the hepatic vein the wire was removed. Contrast injected confirmed location within the hepatic vein. A free vein pressure was measured. The balloon was then inflated achieving stasis. Small contrast injection confirms stasis. Wedge pressure was then achieved. A series of 3 wedge pressure was then performed with slight variation in the position of the balloon occlusion catheter, each time confirming that the catheter was occlusive. Bentson wire was then advanced through the balloon catheter which was inflated, advancing the sheath into the hepatic vein. With the sheath in the hepatic vein, the balloon and wire were removed. The biopsy sheath/cannula was then advanced through the sheath into the selected hepatic veins. Once the cannula was positioned, 4 separate 20 gauge biopsy were achieved, placed into saline. Final injection was performed. Patient tolerated the procedure well and remained hemodynamically stable throughout. No  complications were encountered and no significant blood loss was encountered. FINDINGS: Right atrium: 9/3 (6) Hepatic Vein: 13/7 (10) Wedge Pressure: (29), (26), (28) Calculated portal venous pressure: Range is 16 - 19 mmHg IMPRESSION: Status post image guided access right internal jugular vein for hepatic venogram, pressure measurements, and medical liver biopsy. The series of hepatic vein pressure measurements confirms elevation of the portal pressures. Signed, Dulcy Fanny. Dellia Nims, [place] Vascular and Interventional Radiology Specialists [facility] Radiology Electronically Signed  By: Corrie Mckusick D.O.   On: 12/23/2020 14:06   US Abdomen Limited  Result Date: 12/17/2020 CLINICAL DATA:  Ascites check EXAM: LIMITED ABDOMEN ULTRASOUND FOR ASCITES TECHNIQUE: Limited ultrasound survey for ascites was performed in all four abdominal quadrants. COMPARISON:  Ultrasound abdomen 12/12/2020. FINDINGS: Trace perisplenic free fluid. Limited by large overlying abdominal bandage. IMPRESSION: Trace perisplenic ascites. Limited by large overlying abdominal bandage. Electronically Signed   By: Iven Finn M.D.   On: 12/17/2020 05:25   IR US Guide Vasc Access Right  Result Date: 12/23/2020 INDICATION: 65 year old female with possible cirrhosis, possible portal hypertension EXAM: ULTRASOUND-GUIDED ACCESS RIGHT INTERNAL JUGULAR VEIN HEPATIC VENOGRAM WITH HEMODYNAMIC MEASURING TRANSJUGULAR LIVER BIOPSY MEDICATIONS: None. ANESTHESIA/SEDATION: Moderate (conscious) sedation was employed during this procedure. A total of Versed 0.5 mg and Fentanyl 25 mcg was administered intravenously. Moderate Sedation Time: 49 minutes. The patient's level of consciousness and vital signs were monitored continuously by radiology nursing throughout the procedure under my direct supervision. FLUOROSCOPY TIME:  Fluoroscopy Time: 6 minutes 48 seconds (47 mGy). COMPLICATIONS: None PROCEDURE: Informed written consent was obtained from the patient  after a thorough discussion of the procedural risks, benefits and alternatives. All questions were addressed. Maximal Sterile Barrier Technique was utilized including caps, mask, sterile gowns, sterile gloves, sterile drape, hand hygiene and skin antiseptic. A timeout was performed prior to the initiation of the procedure The right neck and chest was prepped with chlorhexidine, and draped in the usual sterile fashion using maximum barrier technique (cap and mask, sterile gown, sterile gloves, large sterile sheet, hand hygiene and cutaneous antiseptic). Local anesthesia was attained by infiltration with 1% lidocaine without epinephrine. Ultrasound demonstrated patency of the right internal jugular vein, and this was documented with an image. Under real-time ultrasound guidance, this vein was accessed with a 21 gauge micropuncture needle and image documentation was performed. A small dermatotomy was made at the access site with an 11 scalpel. A 0.018" wire was advanced into the SVC and the access needle exchanged for a 69F micropuncture vascular sheath. The 0.018" wire was then removed and a 0.035" wire advanced into the IVC. A 9 French sheath was placed over the wire, and a combination of the Bentson wire an angled catheter were used to select the hepatic veins. Position of the catheter was confirmed with small contrast injection. Once the 65 cm Kumpe the catheter was confirmed within distal hepatic veins, the Bentson wire was removed. Catheter was then positioned within the selected hepatic vein for venogram. Free hepatic vein pressure was achieved. The catheter was then wedged in the distal hepatic vein into the parenchyma, gentle contrast was injected confirming location, and a wedge pressure was achieved. Given that this pressure appeared elevated, we then decided to make various measurements with a balloon occlusion catheter. The Bentson wire was advanced into the hepatic vein in the Kumpe the catheter was  removed. An over the wire Fogarty balloon was advanced on the wire into the hepatic vein. Once the balloon Fogarty was within the hepatic vein the wire was removed. Contrast injected confirmed location within the hepatic vein. A free vein pressure was measured. The balloon was then inflated achieving stasis. Small contrast injection confirms stasis. Wedge pressure was then achieved. A series of 3 wedge pressure was then performed with slight variation in the position of the balloon occlusion catheter, each time confirming that the catheter was occlusive. Bentson wire was then advanced through the balloon catheter which was inflated, advancing the sheath into the  hepatic vein. With the sheath in the hepatic vein, the balloon and wire were removed. The biopsy sheath/cannula was then advanced through the sheath into the selected hepatic veins. Once the cannula was positioned, 4 separate 20 gauge biopsy were achieved, placed into saline. Final injection was performed. Patient tolerated the procedure well and remained hemodynamically stable throughout. No complications were encountered and no significant blood loss was encountered. FINDINGS: Right atrium: 9/3 (6) Hepatic Vein: 13/7 (10) Wedge Pressure: (29), (26), (28) Calculated portal venous pressure: Range is 16 - 19 mmHg IMPRESSION: Status post image guided access right internal jugular vein for hepatic venogram, pressure measurements, and medical liver biopsy. The series of hepatic vein pressure measurements confirms elevation of the portal pressures. Signed, Dulcy Fanny. Dellia Nims, RPVI Vascular and Interventional Radiology Specialists [facility] Radiology Electronically Signed   By: Corrie Mckusick D.O.   On: 12/23/2020 14:06   DG Chest Port 1 View  Result Date: 01/08/2021 CLINICAL DATA:  Evaluate for pleural effusion. EXAM: PORTABLE CHEST 1 VIEW COMPARISON:  01/05/2021. FINDINGS: Aortic atherosclerosis. Interval removal of right pleural drain. No pneumothorax  identified. Diffuse, bilateral reticular and nodular pulmonary opacities are unchanged when compared with 01/05/2021. IMPRESSION: 1. No pneumothorax status post removal of right pleural drain. 2. Persistent interstitial and airspace opacities, unchanged. Electronically Signed   By: Kerby Moors M.D.   On: 01/08/2021 19:34   DG Chest Port 1 View  Result Date: 01/05/2021 CLINICAL DATA:  65 year old female with shortness of breath EXAM: PORTABLE CHEST 1 VIEW COMPARISON:  Multiple prior most recent 01/03/2021 FINDINGS: Cardiomediastinal silhouette unchanged. Reticular opacities of the right greater than left lungs. No pneumothorax. Pleural drain again demonstrated on the right, unchanged in position. No displaced fracture. IMPRESSION: Similar appearance of the chest x-ray with right greater than left mixed interstitial and airspace disease. Unchanged right pleural drain with no pneumothorax or pleural effusion. Electronically Signed   By: Corrie Mckusick D.O.   On: 01/05/2021 09:31   DG CHEST PORT 1 VIEW  Result Date: 01/03/2021 CLINICAL DATA:  Pleural effusion EXAM: PORTABLE CHEST 1 VIEW COMPARISON:  01/01/2021 FINDINGS: Right PleurX catheter remains in place, unchanged. No visible effusion. Diffuse airspace disease throughout the lungs, right greater than left, slightly worsened on the right since prior study. Heart is upper limits normal in size. No pneumothorax. Aortic atherosclerosis. IMPRESSION: Bilateral airspace disease, right greater than left, slightly worsening since prior study. Right PleurX catheter.  No visible effusion or pneumothorax. Electronically Signed   By: Rolm Baptise M.D.   On: 01/03/2021 07:20   DG CHEST PORT 1 VIEW  Result Date: 01/01/2021 CLINICAL DATA:  Shortness of breath. EXAM: PORTABLE CHEST 1 VIEW COMPARISON:  12/31/2020.  12/21/2020. FINDINGS: Right chest tube in stable position. No pneumothorax. Heart size stable. Low lung volumes. Diffuse bilateral pulmonary  infiltrates/edema again noted without interim change. No pleural effusion. IMPRESSION: 1. Right chest tube in stable position. No pneumothorax. 2. Low lung volumes. Diffuse bilateral pulmonary infiltrates/edema again noted without interim change. Electronically Signed   By: Marcello Moores  Register   On: 01/01/2021 06:04   DG CHEST PORT 1 VIEW  Result Date: 12/31/2020 CLINICAL DATA:  Inpatient, shortness of breath. EXAM: PORTABLE CHEST 1 VIEW COMPARISON:  12/21/2020 FINDINGS: Right chest tube overlies the lower hemithorax. The heart size and mediastinal contours are unchanged. Hilar vasculature prominence. Aortic arch calcification. No focal consolidation. Interval increase in interstitial markings that are more prominent within the upper lobes. Trace right pleural effusion. No left pleural effusion.  Previously identified right apical pneumothorax not visualized. No left neumothorax. No acute osseous abnormality. IMPRESSION: 1. Pulmonary edema. 2. Trace right pleural effusion. 3. Interval resolution of right apical pneumothorax. Electronically Signed   By: Iven Finn M.D.   On: 12/31/2020 05:32   DG CHEST PORT 1 VIEW  Result Date: 12/20/2020 CLINICAL DATA:  PleurX catheter placement EXAM: PORTABLE CHEST 1 VIEW COMPARISON:  12/20/2020 FINDINGS: In the interval, right basilar pigtail chest tube has been removed and a tunneled PleurX drainage catheter has been place at the right lung base extending to the cardiophrenic angle. Small right pneumothorax has developed. No evidence of tension physiology. The lungs are clear. No pneumothorax or pleural effusion on the left. Cardiac size within normal limits. Moderate hiatal hernia noted. Pulmonary vascularity is normal. IMPRESSION: Interval right basilar PleurX drainage catheter placement. Small right apical pneumothorax now present. Electronically Signed   By: Fidela Salisbury MD   On: 12/20/2020 15:30   DG Chest Port 1 View  Result Date: 12/20/2020 CLINICAL DATA:   Follow-up chest tube EXAM: PORTABLE CHEST 1 VIEW COMPARISON:  12/19/2020 FINDINGS: Cardiac shadow is stable. Aortic calcifications are again seen. Pigtail catheter is noted on the right stable in appearance. No sizable effusion is seen. No pneumothorax is noted. No bony abnormality is seen. IMPRESSION: No acute abnormality noted. Previously seen right-sided effusion is less well visualized. Electronically Signed   By: Inez Catalina M.D.   On: 12/20/2020 03:58   DG CHEST PORT 1 VIEW  Result Date: 12/19/2020 CLINICAL DATA:  Pleural effusion EXAM: PORTABLE CHEST 1 VIEW COMPARISON:  December 19, 2020, December 18, 2020, December 09, 2020 FINDINGS: The cardiomediastinal silhouette is unchanged in contour.Small RIGHT pleural effusion with RIGHT-sided chest tube in place. No significant pneumothorax. Persistent homogeneous opacification of the RIGHT lung base. Moderate hiatal hernia delineated by enteric contrast. No acute osseous abnormality. IMPRESSION: Small RIGHT pleural effusion with RIGHT-sided chest tube in place. No significant pneumothorax. Electronically Signed   By: Valentino Saxon MD   On: 12/19/2020 09:37   DG CHEST PORT 1 VIEW  Result Date: 12/18/2020 CLINICAL DATA:  Follow-up pleural effusion and drain, initial encounter EXAM: PORTABLE CHEST 1 VIEW COMPARISON:  12/16/2020 FINDINGS: Cardiac shadow is stable. Aortic calcifications are again seen. Mild vascular congestion is noted new from the prior study. Right pigtail catheter is noted in place without evidence of pneumothorax. Slight increased density is noted likely related to posteriorly layering effusion. No bony abnormality is seen. IMPRESSION: Likely small posterior fusion on the right. Mild central vascular congestion is noted. Electronically Signed   By: Inez Catalina M.D.   On: 12/18/2020 03:26   DG CHEST PORT 1 VIEW  Result Date: 12/16/2020 CLINICAL DATA:  Status post right-sided pleural drainage catheter placement for pleural effusion on  12/13/2020 EXAM: PORTABLE CHEST 1 VIEW COMPARISON:  12/15/2020 FINDINGS: The heart size and mediastinal contours are within normal limits. No further visualized pneumothorax. No further visualized pleural fluid. Improved aeration of the right lung. There is no evidence of pulmonary edema or focal airspace disease. The visualized skeletal structures are unremarkable. IMPRESSION: No further visualized right pleural fluid with improved aeration of the right lung. Resolved right pneumothorax. Electronically Signed   By: Aletta Edouard M.D.   On: 12/16/2020 08:18   DG CHEST PORT 1 VIEW  Result Date: 12/15/2020 CLINICAL DATA:  , Shortness of breath, RIGHT apex pneumothorax post thoracostomy tube placement, hiatal hernia EXAM: PORTABLE CHEST 1 VIEW COMPARISON:  Portable exam 0656 hours  compared to 12/14/2020 FINDINGS: Pigtail RIGHT thoracostomy tube again identified. Tiny RIGHT apex pneumothorax, slightly decreased from previous exam. Normal heart size and pulmonary vascularity. Density at the medial inferior LEFT hemithorax corresponds to hiatal hernia on a prior CT. Atherosclerotic calcification aorta. Pleural effusion and atelectasis at RIGHT lung base. Remaining lungs clear. No pneumothorax. Bones demineralized. IMPRESSION: Hiatal hernia. Tiny RIGHT apex pneumothorax, decreased from previous exam. Persistent mild RIGHT basilar atelectasis and effusion. Electronically Signed   By: Lavonia Dana M.D.   On: 12/15/2020 09:22   DG CHEST PORT 1 VIEW  Result Date: 12/14/2020 CLINICAL DATA:  Recurrent right pleural effusion. EXAM: PORTABLE CHEST 1 VIEW COMPARISON:  12/13/2020 FINDINGS: Interval right pleural pigtail catheter. Mild residual ill-defined opacity at the right lung base. Approximately 5% right apical pneumothorax. Moderately large hiatal hernia. Borderline enlarged cardiac silhouette. Clear left lung. Cholecystectomy clips. No acute bony abnormality. IMPRESSION: 1. Approximately 5% right apical pneumothorax  with a right pleural pigtail catheter in place. 2. Mild residual right basilar pleural fluid/atelectasis. 3. Moderately large hiatal hernia. Electronically Signed   By: Claudie Revering M.D.   On: 12/14/2020 12:17   DG CHEST PORT 1 VIEW  Result Date: 12/13/2020 CLINICAL DATA:  Shortness of breath, pleural effusion. EXAM: PORTABLE CHEST 1 VIEW COMPARISON:  12/11/2020 and prior. FINDINGS: No pneumothorax. Decreased right pleural effusion. Partially obscured cardiomediastinal silhouette. Interval increase in basilar predominant patchy opacities. IMPRESSION: Decreased right pleural effusion.  No pneumothorax. Increased basilar predominant opacities. Electronically Signed   By: Primitivo Gauze M.D.   On: 12/13/2020 08:23   DG C-Arm 1-60 Min-No Report  Result Date: 12/20/2020 Fluoroscopy was utilized by the requesting physician.  No radiographic interpretation.   ECHOCARDIOGRAM COMPLETE  Result Date: 12/13/2020    ECHOCARDIOGRAM REPORT   Patient Name:   Lori Cooper Date of Exam: 12/13/2020 Medical Rec #:  [id number]      Height:       65.0 in Accession #:    [id number]     Weight:       200.0 lb Date of Birth:  02-Oct-1956       BSA:          1.978 m Patient Age:    65 years       BP:           103/47 mmHg Patient Gender: F              HR:           82 bpm. Exam Location:  Inpatient Procedure: 2D Echo, Cardiac Doppler and Color Doppler Indications:    Dyspnea  History:        Patient has prior history of Echocardiogram examinations, most                 recent 03/11/2018. Risk Factors:Dyslipidemia. GERD.  Sonographer:    Clayton Lefort RDCS (AE) Referring Phys: [id number] [facility]  Sonographer Comments: Image acquisition challenging due to respiratory motion. IMPRESSIONS  1. Left ventricular ejection fraction, by estimation, is 60 to 65%. The left ventricle has normal function. The left ventricle has no regional wall motion abnormalities. There is mild left ventricular hypertrophy. Left ventricular diastolic  parameters are consistent with Grade I diastolic dysfunction (impaired relaxation).  2. Right ventricular systolic function is normal. The right ventricular size is normal. There is normal pulmonary artery systolic pressure. The estimated right ventricular systolic pressure is 51.0 mmHg.  3. The mitral valve is grossly normal. Trivial mitral valve  regurgitation.  4. The aortic valve is tricuspid. Aortic valve regurgitation is not visualized. Mild aortic valve sclerosis is present, with no evidence of aortic valve stenosis.  5. The inferior vena cava is normal in size with greater than 50% respiratory variability, suggesting right atrial pressure of 3 mmHg. Comparison(s): Prior images unable to be directly viewed, comparison made by report only. Changes from prior study are noted. 03/11/2018: LVEF 60-65%. FINDINGS  Left Ventricle: Left ventricular ejection fraction, by estimation, is 60 to 65%. The left ventricle has normal function. The left ventricle has no regional wall motion abnormalities. The left ventricular internal cavity size was normal in size. There is  mild left ventricular hypertrophy. Left ventricular diastolic parameters are consistent with Grade I diastolic dysfunction (impaired relaxation). Indeterminate filling pressures. Right Ventricle: The right ventricular size is normal. No increase in right ventricular wall thickness. Right ventricular systolic function is normal. There is normal pulmonary artery systolic pressure. The tricuspid regurgitant velocity is 2.80 m/s, and  with an assumed right atrial pressure of 3 mmHg, the estimated right ventricular systolic pressure is 32.6 mmHg. Left Atrium: Left atrial size was normal in size. Right Atrium: Right atrial size was normal in size. Pericardium: There is no evidence of pericardial effusion. Mitral Valve: The mitral valve is grossly normal. Trivial mitral valve regurgitation. Tricuspid Valve: The tricuspid valve is grossly normal. Tricuspid valve  regurgitation is trivial. Aortic Valve: The aortic valve is tricuspid. Aortic valve regurgitation is not visualized. Mild aortic valve sclerosis is present, with no evidence of aortic valve stenosis. Aortic valve mean gradient measures 6.0 mmHg. Aortic valve peak gradient measures 12.4 mmHg. Aortic valve area, by VTI measures 2.28 cm. Pulmonic Valve: The pulmonic valve was normal in structure. Pulmonic valve regurgitation is not visualized. Aorta: The aortic root and ascending aorta are structurally normal, with no evidence of dilitation. Venous: The inferior vena cava is normal in size with greater than 50% respiratory variability, suggesting right atrial pressure of 3 mmHg. IAS/Shunts: No atrial level shunt detected by color flow Doppler.  LEFT VENTRICLE PLAX 2D LVIDd:         4.80 cm  Diastology LVIDs:         3.10 cm  LV e' medial:    7.18 cm/s LV PW:         1.40 cm  LV E/e' medial:  15.7 LV IVS:        1.20 cm  LV e' lateral:   10.80 cm/s LVOT diam:     1.90 cm  LV E/e' lateral: 10.5 LV SV:         85 LV SV Index:   43 LVOT Area:     2.84 cm  RIGHT VENTRICLE             IVC RV Basal diam:  3.10 cm     IVC diam: 1.50 cm RV S prime:     14.10 cm/s TAPSE (M-mode): 2.3 cm LEFT ATRIUM           Index       RIGHT ATRIUM           Index LA diam:      3.70 cm 1.87 cm/m  RA Area:     15.00 cm LA Vol (A2C): 54.6 ml 27.60 ml/m RA Volume:   36.30 ml  18.35 ml/m LA Vol (A4C): 41.3 ml 20.88 ml/m  AORTIC VALVE AV Area (Vmax):    2.35 cm AV Area (Vmean):   2.28 cm  AV Area (VTI):     2.28 cm AV Vmax:           176.00 cm/s AV Vmean:          119.000 cm/s AV VTI:            0.375 m AV Peak Grad:      12.4 mmHg AV Mean Grad:      6.0 mmHg LVOT Vmax:         146.00 cm/s LVOT Vmean:        95.500 cm/s LVOT VTI:          0.301 m LVOT/AV VTI ratio: 0.80  AORTA Ao Root diam: 3.10 cm Ao Asc diam:  3.20 cm MITRAL VALVE                TRICUSPID VALVE MV Area (PHT): 3.91 cm     TR Peak grad:   31.4 mmHg MV Decel Time: 194 msec      TR Vmax:        280.00 cm/s MV E velocity: 113.00 cm/s MV A velocity: 109.00 cm/s  SHUNTS MV E/A ratio:  1.04         Systemic VTI:  0.30 m                             Systemic Diam: 1.90 cm Lyman Bishop MD Electronically signed by Lyman Bishop MD Signature Date/Time: 12/13/2020/3:16:40 PM    Final    DG ESOPHAGUS W SINGLE CM (SOL OR THIN BA)  Result Date: 12/19/2020 CLINICAL DATA:  Esophageal dysphagia.  Food getting stuck. EXAM: ESOPHOGRAM/BARIUM SWALLOW TECHNIQUE: Single contrast examination was performed using  thin barium. FLUOROSCOPY TIME:  Fluoroscopy Time:  2 minutes and 6 seconds Radiation Exposure Index (if provided by the fluoroscopic device): 22.40 mGy Number of Acquired Spot Images: 0 COMPARISON:  Chest CT 12/09/2020 FINDINGS: Esophageal dysmotility with disruption of the primary peristaltic wave, tertiary contractions and intermittent esophageal spasm. There is a moderate to large hiatal hernia noted with a strictured narrowing at the GE junction. The 13 mm barium pill would not pass through this area. IMPRESSION: 1. Moderate to large hiatal hernia. 2. 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 The 13 mm barium pill would not pass through this area. Recommend endoscopic evaluation and potential treatment. 3. Esophageal dysmotility and moderate stasis. Electronically Signed   By: Marijo Sanes M.D.   On: 12/19/2020 08:55   IR PERC PLEURAL DRAIN W/INDWELL CATH W/IMG GUIDE  Result Date: 12/26/2020 INDICATION: 66 year old female with a history right-sided pleural effusion. EXAM: IMAGE GUIDED PLACEMENT OF RIGHT-SIDED PLEURAL DRAIN MEDICATIONS: The patient is currently admitted to the hospital and receiving intravenous antibiotics. The antibiotics were administered within an appropriate time frame prior to the initiation of the procedure. ANESTHESIA/SEDATION: Fentanyl 25 mcg IV; Versed 0.5 mg IV Moderate Sedation Time:  11 minutes The patient was continuously monitored during the procedure by the  interventional radiology nurse under my direct supervision. COMPLICATIONS: None PROCEDURE: The procedure, risks, benefits, and alternatives were explained to the patient/patient's family, who provided informed consent on the patient's behalf. Specific risks that were addressed included bleeding, infection, ongoing pneumothorax, need for further procedure/surgery, chance of hemorrhage, hemoptysis, cardiopulmonary collapse, death. Questions regarding the procedure were encouraged and answered. The patient understands and consents to the procedure. Patient was positioned in the right anterior oblique position on the IR table and scout image of the chest was performed for  planning purposes. The right mid axillary line at the level of the nipple was identified, and prepped and draped in the usual sterile fashion. The skin and subcutaneous tissues were generously infiltrated 1% lidocaine for local anesthesia. A trocar needle was then used to enter the pleural space using ultrasound guidance. The inner trocar introducer was removed and an 035 guidewire was advanced to the apex of the lung under fluoroscopy. Dilation of the skin tract was performed over the wire, and then modified Seldinger technique was used to place a 10 French pigtail catheter into the pleural space. Catheter was attached to water seal chamber and suction was applied confirming a operational chest tube. Retention suture was placed.  Sterile dressing was placed. Patient tolerated the procedure well and remained hemodynamically stable throughout. No complications were encountered and no significant blood loss was encounter IMPRESSION: Status post image guided right-sided pleural drainage catheter. Signed, Dulcy Fanny. Dellia Nims, RPVI [facility] Electronically Signed   By: Corrie Mckusick D.O.   On: 12/13/2020 18:04       The results of significant diagnostics from this hospitalization (including  imaging, microbiology, ancillary and laboratory) are listed below for reference.     Microbiology: No results found for this or any previous visit (from the past 240 hour(s)).   Labs:  CBC: Recent Labs  Lab 01/06/21 0649 01/07/21 0149 01/08/21 1030 01/08/21 2227 01/09/21 0034 01/10/21 0230  WBC 14.1* 11.4* 16.6*  --  15.2* 12.8*  NEUTROABS  --   --  13.9*  --  11.6*  --   HGB 8.6* 7.6* 8.7* 8.7* 8.1* 8.9*  HCT 24.6* 20.7* 23.8* 24.8* 23.5* 25.4*  MCV 82.0 80.2 82.4  --  83.3 83.6  PLT 188 190 181  --  191 222   BMP &GFR Recent Labs  Lab 01/06/21 0649 01/07/21 0149 01/08/21 1030 01/09/21 0034 01/10/21 0230  NA 133* 134* 136 134* 137  K 3.9 3.6 3.9 3.9 3.6  CL 105 106 108 109 109  CO2 20* 20* 16* 13* 19*  GLUCOSE 123* 115* 115* 104* 101*  BUN 30* 27* $Remov'20 20 21  'VjuEMR$ CREATININE 1.97* 1.67* 1.32* 1.30* 1.39*  CALCIUM 8.6* 8.7* 8.8* 8.7* 8.9  MG  --   --  1.7 1.7 1.9  PHOS  --   --  2.4*  --  3.2   Estimated Creatinine Clearance: 42.4 mL/min (A) (by C-G formula based on SCr of 1.39 mg/dL (H)). Liver & Pancreas: Recent Labs  Lab 01/06/21 0649 01/07/21 0149 01/08/21 1030 01/09/21 0034 01/10/21 0230  AST 215* 192* 176* 193* 242*  ALT 62* 55* 51* 51* 65*  ALKPHOS 141* 118 119 107 133*  BILITOT 4.6* 5.2* 5.4* 4.8* 5.8*  PROT 5.4* 5.6* 5.9* 5.2* 5.7*  ALBUMIN 2.1* 3.0* 3.2* 2.8* 2.9*   No results for input(s): LIPASE, AMYLASE in the last 168 hours. Recent Labs  Lab 01/05/21 0318 01/07/21 1231 01/08/21 1030 01/08/21 2227 01/10/21 0230  AMMONIA 32 41* 35 51* 27   Diabetic: No results for input(s): HGBA1C in the last 72 hours. Recent Labs  Lab 01/10/21 0509 01/10/21 0727 01/10/21 1116 01/10/21 1634 01/10/21 1935  GLUCAP 113* 114* 146* 105* 123*   Cardiac Enzymes: No results for input(s): CKTOTAL, CKMB, CKMBINDEX, TROPONINI in the last 168 hours. No results for input(s): PROBNP in the last 8760 hours. Coagulation Profile: Recent Labs  Lab 01/10/21 0230   INR 4.1*   Thyroid Function Tests: No results for input(s):  TSH, T4TOTAL, FREET4, T3FREE, THYROIDAB in the last 72 hours. Lipid Profile: No results for input(s): CHOL, HDL, LDLCALC, TRIG, CHOLHDL, LDLDIRECT in the last 72 hours. Anemia Panel: No results for input(s): VITAMINB12, FOLATE, FERRITIN, TIBC, IRON, RETICCTPCT in the last 72 hours. Urine analysis:    Component Value Date/Time   COLORURINE AMBER (A) 12/22/2020 1313   APPEARANCEUR HAZY (A) 12/22/2020 1313   APPEARANCEUR Clear 03/23/2017 1006   LABSPEC 1.016 12/22/2020 1313   PHURINE 5.0 12/22/2020 1313   GLUCOSEU NEGATIVE 12/22/2020 1313   HGBUR NEGATIVE 12/22/2020 1313   BILIRUBINUR NEGATIVE 12/22/2020 1313   BILIRUBINUR Negative 03/23/2017 [place] 12/22/2020 1313   PROTEINUR NEGATIVE 12/22/2020 1313   NITRITE NEGATIVE 12/22/2020 1313   LEUKOCYTESUR MODERATE (A) 12/22/2020 1313   Sepsis Labs: Invalid input(s): PROCALCITONIN, LACTICIDVEN   Time coordinating discharge: 55 minutes  SIGNED:  Mercy Riding, MD  [facility] 01/10/2021, 9:30 PM  If 7PM-7AM, please contact night-coverage [email or web address]
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
## 2021-01-10 NOTE — TOC Transition Note (Addendum)
Transition of Care [facility]) - CM/SW Discharge Note   Patient Details  Name: Lori Cooper MRN: [id number] Date of Birth: 12/17/1955  [facility] CM/SW Contact:  Joanne Chars, LCSW Phone Number: 01/10/2021, 11:59 AM   Clinical Narrative:   Pt discharging home with [facility].  Hospital bed, wheelchair, 3n1 delivered to daughter's home by [employer].  [facility] to provide palliative services.  Pt requires ambulance transport home per MD.  Daughter [name] at hospital this AM, confirms that she is ready to receive pt.  Correction: [facility] to transport.      Final next level of care: Home w Home Health Services Barriers to Discharge: Barriers Resolved   Patient Goals and CMS Choice   CMS Medicare.gov Compare Post Acute Care list provided to:: Patient Represenative (must comment) Choice offered to / list presented to : Spouse  Discharge Placement                Patient to be transferred to facility by: [place] Name of family member notified: daughter [name] Patient and family notified of of transfer: 01/10/21  Discharge Plan and Services     Post Acute Care Choice: [place]          DME Arranged: 3-N-1,Hospital bed,Wheelchair manual DME Agency: [employer] Date DME Agency Contacted: 01/08/21 Time DME Agency Contacted: 64 Representative spoke with at DME Agency: [place]: PT,OT,RN [place] Date [place]: 01/02/21 Time [place]: 1215 Representative spoke with at [place]: Amy  Social Determinants of Health ([place]) Interventions     Readmission Risk Interventions Readmission Risk Prevention Plan 01/09/2021 12/12/2020  Transportation Screening (No Data) Complete  PCP or Specialist Appt within 3-5 Days - Complete  HRI or [place] - (No Data)  Social Work Consult for [place] Planning/Counseling - [place] - Not Applicable   PCP or Specialist appointment within 3-5 days of discharge Complete -  [place] or Home Care Consult Complete -  SW Recovery Care/Counseling Consult Complete -  Palliative Care Screening Not Applicable -  Skilled Nursing Facility Complete -  Some recent data might be hidden

## 2021-01-10 NOTE — Discharge Instructions (Signed)
Soft-Food Eating Plan A soft-food eating plan includes foods that are safe and easy to chew and swallow. Your health care provider or dietitian can help you find foods and flavors that fit into this plan. Follow this plan until your health care provider or dietitian says it is safe to start eating other foods and food textures. What are tips for following this plan? General guidelines Take small bites of food, or cut food into pieces about  inch or smaller. Bite-sized pieces of food are easier to chew and swallow. Eat moist foods. Avoid overly dry foods. Avoid foods that: Are difficult to swallow, such as dry, chunky, crispy, or sticky foods. Are difficult to chew, such as hard, tough, or stringy foods. Contain nuts, seeds, or fruits. Follow instructions from your dietitian about the types of liquids that are safe for you to swallow. You may be allowed to have: Thick liquids only. This includes only liquids that are thicker than honey. Thin and thick liquids. This includes all beverages and foods that become liquid at room temperature. To make thick liquids: Purchase a commercial liquid thickening powder. These are available at grocery stores and pharmacies. Mix the thickener into liquids according to instructions on the label. Purchase ready-made thickened liquids. Thicken soup by pureeing, straining to remove chunks, and adding flour, potato flakes, or corn starch. Add commercial thickener to foods that become liquid at room temperature, such as milk shakes, yogurt, ice cream, gelatin, and sherbet. Ask your health care provider whether you need to take a fiber supplement.   Cooking Cook meats so they stay tender and moist. Use methods like braising, stewing, or baking in liquid. Cook vegetables and fruit until they are soft enough to be mashed with a fork. Peel soft, fresh fruits such as peaches, nectarines, and melons. When making soup, make sure chunks of meat and vegetables are smaller  than  inch. Reheat leftover foods slowly so that a tough crust does not form. What foods are allowed? The items listed below may not be a complete list. Talk with your dietitian about what dietary choices are best for you. Grains Breads, muffins, pancakes, or waffles moistened with syrup, jelly, or butter. Dry cereals well-moistened with milk. Moist, cooked cereals. Well-cooked pasta and rice. Vegetables All soft-cooked vegetables. Shredded lettuce. Fruits All canned and cooked fruits. Soft, peeled fresh fruits. Strawberries. Dairy Milk. Cream. Yogurt. Cottage cheese. Soft cheese without the rind. Meats and other protein foods Tender, moist ground meat, poultry, or fish. Meat cooked in gravy or sauces. Eggs. Sweets and desserts Ice cream. Milk shakes. Sherbet. Pudding. Fats and oils Butter. Margarine. Olive, canola, sunflower, and grapeseed oil. Smooth salad dressing. Smooth cream cheese. Mayonnaise. Gravy. What foods are not allowed? The items listed bemay not be a complete list. Talk with your dietitian about what dietary choices are best for you. Grains Coarse or dry cereals, such as bran, granola, and shredded wheat. Tough or chewy crusty breads, such as [place] bread or baguettes. Breads with nuts, seeds, or fruit. Vegetables All raw vegetables. Cooked corn. Cooked vegetables that are tough or stringy. Tough, crisp, fried potatoes and potato skins. Fruits Fresh fruits with skins or seeds, or both, such as apples, pears, and grapes. Stringy, high-pulp fruits, such as papaya, pineapple, coconut, and mango. Fruit leather and all dried fruit. Dairy Yogurt with nuts or coconut. Meats and other protein foods Hard, dry sausages. Dry meat, poultry, or fish. Meats with gristle. Fish with bones. Fried meat or fish. Lunch meat and  hotdogs. Nuts and seeds. Chunky peanut butter or other nut butters. Sweets and desserts Cakes or cookies that are very dry or chewy. Desserts with dried fruit,  nuts, or coconut. Fried pastries. Very rich pastries. Fats and oils Cream cheese with fruit or nuts. Salad dressings with seeds or chunks. Summary A soft-food eating plan includes foods that are safe and easy to swallow. Generally, the foods should be soft enough to be mashed with a fork. Avoid foods that are dry, hard to chew, crunchy, sticky, stringy, or crispy. Ask your health care provider whether you need to thicken your liquids and if you need to take a fiber supplement. This information is not intended to replace advice given to you by your health care provider. Make sure you discuss any questions you have with your health care provider. Document Revised: 02/23/2019 Document Reviewed: 01/05/2017 Elsevier Patient Education  2021 [place]. Daily Pleurix drainage If <150 ml Charlsie Quest session x3 consecutive occasions - QOD drainage If <150 ml /drainage session x3 consecutive occasions on  QOD drainage schedule- call [facility] office  [phone]) for evaluation and possible removal. Please keep a record of the output and bring this record to office appointment with Dr. Servando Snare    Thoracentesis, Care After This sheet gives you information about how to care for yourself after your procedure. Your health care provider may also give you more specific instructions. If you have problems or questions, contact your health care provider. What can I expect after the procedure? After your procedure, it is common to have some pain at the site where the needle was inserted (puncture site). Follow these instructions at home: Care of the puncture site  Follow instructions from your health care provider about how to take care of your puncture site. Make sure you: ? Wash your hands with soap and water before you change your bandage (dressing). If soap and water are not available, use hand sanitizer. ? Change your dressing as told by your health care provider.  Check the puncture site every day for  signs of infection. Check for: ? Redness, swelling, or pain. ? Fluid or blood. ? Warmth. ? Pus or a bad smell.  Do not take baths, swim, or use a hot tub until your health care provider approves. General instructions  Take over-the-counter and prescription medicines only as told by your health care provider.  Do not drive for 24 hours if you were given a medicine to help you relax (sedative) during your procedure.  Drink enough fluid to keep your urine pale yellow.  You may return to your normal diet and normal activities as told by your health care provider.  Keep all follow-up visits as told by your health care provider. This is important.   Contact a health care provider if you:  Have redness, swelling, or pain at your puncture site.  Have fluid or blood coming from your puncture site.  Notice that your puncture site feels warm to the touch.  Have pus or a bad smell coming from your puncture site.  Have a fever.  Have chills.  Have nausea or vomiting.  Have trouble breathing.  Develop a worsening cough. Get help right away if you:  Have extreme shortness of breath.  Develop chest pain.  Faint or feel light-headed. Summary  After your procedure, it is common to have some pain at the site where the needle was inserted (puncture site).  Wash your hands with soap and water before you change your bandage (  dressing).  Check your puncture site every day for signs of infection.  Take over-the-counter and prescription medicines only as told by your health care provider. This information is not intended to replace advice given to you by your health care provider. Make sure you discuss any questions you have with your health care provider. Document Revised: 07/04/2020 Document Reviewed: 07/04/2020 Elsevier Patient Education  [place] For Adding Protein Nutrition Therapy  Patients may be advised to increase the protein in their diet but not  necessarily the calories as well. However, note that when adding protein to your diet, you will also be adding extra calories. The following suggestions may help add the extra protein while keeping the calories as low as possible.  Tips . Add extra egg to one or more meals  . Increase the portion of milk to drink and change to skim milk if able  . Include [place] yogurt or cottage cheese for snack or part of a meal  . Increase portion size of protein entre and decrease portion of starch/bread  . Mix protein powder, nut butter, almond/nut milk, non-fat dry milk, or Greek yogurt to shakes and smoothies  . Use these ingredients also in baked goods or other recipes . Use double the amount of sandwich filling  . Add protein foods to all snacks including cheese, nut butters, milk and yogurt Food Tips for Including Protein  Beans . Cook and use dried peas, beans, and tofu in soups or add to casseroles, pastas, and grain dishes that also contain cheese or meat  . Mash with cheese and milk  . Use tofu to make smoothies  Commercial Protein Supplements . Use nutritional supplements or protein powder sold at pharmacies and grocery stores  . Use protein powder in milk drinks and desserts, such as pudding  . Mix with ice cream, milk, and fruit or other flavorings for a high-protein milkshake  Cottage Cheese or [employer] . Mix with or use to stuff fruits and vegetables  . Add to casseroles, spaghetti, noodles, or egg dishes such as omelets, scrambled eggs, and souffls  . Use gelatin, pudding-type desserts, cheesecake, and pancake or waffle batter  . Use to stuff crepes, pasta shells, or manicotti  . Puree and use as a substitute for sour cream  Eggs, Egg whites, and Egg Yolks . Add chopped, hard-cooked eggs to salads and dressings, vegetables, casseroles, and creamed meats  . Beat eggs into mashed potatoes, vegetable purees, and sauces  . Add extra egg whites to quiches, scrambled eggs, custards,  puddings, pancake batter, or [place]  . Make a rich custard with egg yolks, double strength milk, and sugar  . Add extra hard-cooked yolks to deviled egg filling and sandwich spreads  Hard or Semi-Soft Cheese (Cheddar, Sherley Bounds) . Melt on sandwiches, bread, muffins, tortillas, hamburgers, hot dogs, other meats or fish, vegetables, eggs, or desserts such as stewed figs or pies  . Grate and add to soups, sauces, casseroles, vegetable dishes, potatoes, rice noodles, or meatloaf  . Serve as a snack with crackers or bagels  Ice cream, Yogurt, and Frozen Yogurt . Add to milk drinks such as milkshakes  . Add to cereals, fruits, gelatin desserts, and pies  . Blend or whip with soft or cooked fruits  . Sandwich ice cream or frozen yogurt between enriched cake slices, cookies, or graham crackers  . Use seasoned yogurt as a dip for fruits, vegetables, or chips  . Use yogurt in  place of sour cream in casseroles  Meat and Fish . Add chopped, cooked meat or fish to vegetables, salads, casseroles, soups, sauces, and biscuit dough  . Use in omelets, souffls, quiches, and sandwich fillings  . Add chicken and [place] to stuffing  . Wrap in pie crust or biscuit dough as turnovers  . Add to stuffed baked potatoes  . Add pureed meat to soups  Milk . Use in beverages and in cooking  . Use in preparing foods, such as hot cereal, soups, cocoa, or pudding  . Add cream sauces to vegetable and other dishes  . Use evaporated milk, evaporated skim milk, or sweetened condensed milk instead of milk or water in recipes.  Nonfat Dry Milk . Add 1/3 cup of nonfat dry milk powdered milk to each cup of regular milk for "double strength" milk  . Add to yogurt and milk drinks, such as pasteurized eggnog and milkshakes  . Add to scrambled eggs and mashed potatoes  . Use in casseroles, meatloaf, hot cereal, breads, muffins, sauces, cream soups, puddings and custards, and other milk-based desserts  Nuts, Seeds,  and Wheat Germ . Add to casseroles, breads, muffins, pancakes, cookies, and waffles  . Sprinkle on fruit, cereal, ice cream, yogurt, vegetables, salads, and toast as a crunchy topping  . Use in place of breadcrumbs  . Blend with parsley or spinach, herbs, and cream for a noodle, pasta, or vegetable sauce.  . Roll banana in chopped nuts  Peanut Butter . Spread on sandwiches, toast, muffins, crackers, waffles, pancakes, and fruit slices  . Use as a dip for raw vegetables, such as carrots, cauliflower, and celery  . Blend with milk drinks, smoothies, and other beverages  . Swirl through soft ice cream or yogurt  . Spread on a banana then roll in crushed, dry cereal or chopped nuts   Copyright 2020  Academy of Nutrition and Dietetics. All rights reserved

## 2021-01-10 NOTE — Telephone Encounter (Signed)
Referral placed.

## 2021-01-10 NOTE — Progress Notes (Addendum)
      [address]       [place] [id number]             [phone]      20 Days Post-Op Procedure(s) (LRB): ESOPHAGOGASTRODUODENOSCOPY (EGD) WITH PROPOFOL (N/A) BIOPSY (N/A) UPPER ENDOSCOPIC ULTRASOUND (EUS) LINEAR (N/A) Subjective: Much calmer this morning. Daughter is present in the room.   Objective: Vital signs in last 24 hours: Temp:  [98.2 F (36.8 C)] 98.2 F (36.8 C) (02/24 2100) Pulse Rate:  [77-86] 77 (02/25 0621) Resp:  [20] 20 (02/24 2100) BP: (93-100)/(39-45) 100/39 (02/25 0621)    Intake/Output from previous day: No intake/output data recorded. Intake/Output this shift: No intake/output data recorded.  General appearance: alert, cooperative and no distress Heart: regular rate and rhythm, S1, S2 normal, no murmur, click, rub or gallop Lungs: clear to auscultation bilaterally Abdomen: soft, non-tender; bowel sounds normal; no masses,  no organomegaly Extremities: extremities normal, atraumatic, no cyanosis or edema Wound: a colostomy bag hooked up to a foley bag has been placed over the pleurx catheter incision.   Lab Results: Recent Labs    01/09/21 0034 01/10/21 0230  WBC 15.2* 12.8*  HGB 8.1* 8.9*  HCT 23.5* 25.4*  PLT 191 222   BMET:  Recent Labs    01/09/21 0034 01/10/21 0230  NA 134* 137  K 3.9 3.6  CL 109 109  CO2 13* 19*  GLUCOSE 104* 101*  BUN 20 21  CREATININE 1.30* 1.39*  CALCIUM 8.7* 8.9    PT/INR:  Recent Labs    01/10/21 0230  LABPROT 38.3*  INR 4.1*   ABG    Component Value Date/Time   PHART 7.377 08/08/2020 2303   HCO3 23.3 08/08/2020 2303   TCO2 21 (L) 08/10/2020 1118   ACIDBASEDEF 2.0 08/08/2020 2303   O2SAT 96.0 08/08/2020 2303   CBG (last 3)  Recent Labs    01/10/21 0021 01/10/21 0509 01/10/21 0727  GLUCAP 100* 113* 114*    Assessment/Plan: S/P Procedure(s) (LRB): ESOPHAGOGASTRODUODENOSCOPY (EGD) WITH PROPOFOL (N/A) BIOPSY (N/A) UPPER ENDOSCOPIC ULTRASOUND (EUS) LINEAR  (N/A)  1. Pleurx catheter dislodged sometime Monday night. Since then, she has had copious amounts of straw colored drainage from her incision. Her incision is covered with a colostomy bag and hooked to a foley bag. 1200cc/24 hours.  2. Hypotension-on midodrine when she agrees to take it.  3. On Eliquis BID, INR is 4.1  Plan: She will likely eventually need another pleurx catheter. Still unsure how it was dislodged since there is no record of the incident. For now, continue drainage system and educate family on how to empty. Home today with home health. Follow-up already placed in the chart.    LOS: 31 days    Elgie Collard 01/10/2021  Currently on eliquis and INR elevated to 4, with amt of drainage from chest tube site will continue with external drainage and not stich previous insertion site - Patients family does not want to wait in hospital to correct anticoagulation and attempt replacement of catheter at this time  Are planning on going home today   [name] MD [place] Office [phone] 01/10/2021 8:49 AM

## 2021-01-10 NOTE — Progress Notes (Signed)
Showed daughter medication list and verbalized understanding of med schedule.  Included POA in DC paperwork for daughter as well as extra urinary drainage bags. PIV removed by Nurse Tech.  Packed up all belongings and will change patient into home clothes when PTAR arrives.
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
## 2021-01-10 NOTE — Telephone Encounter (Signed)
Orders for Palliative Care Pt in [facility] for Hepatic Encephalitis & liver cirrhosis

## 2021-01-10 NOTE — Progress Notes (Addendum)
Nutrition Education Note  RD consulted for cirrhosis diet education.   Unable to provide education to patient at bedside as she was sleeping. Patient has not had adequate nutrition since admit. Would not recommend further restriction to diet as patient is malnourished. RD to provide high protein food list in handout.   Mariana Single RD, LDN Clinical Nutrition Pager listed in [place]
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
## 2021-01-10 NOTE — Progress Notes (Signed)
SLP Cancellation Note  Patient Details Name: MAILINH MONTEZUMA MRN: [id number] DOB: 05/12/1956   Cancelled treatment:       Reason Eval/Treat Not Completed: Patient at procedure or test/unavailable (Pt with MD and PT at this time. SLP will follow up as schedule allows.)  Shanika I. Hardin Negus, [place], [place] Office number [phone] Pager [place] 01/10/2021, 8:59 AM

## 2021-01-10 NOTE — Progress Notes (Signed)
Pt INR is 4.1 provider [place], MD  Was notified the plan is to continue care.

## 2021-01-11 DIAGNOSIS — K766 Portal hypertension: Secondary | ICD-10-CM | POA: Diagnosis not present

## 2021-01-11 DIAGNOSIS — K7581 Nonalcoholic steatohepatitis (NASH): Secondary | ICD-10-CM | POA: Diagnosis not present

## 2021-01-11 DIAGNOSIS — F329 Major depressive disorder, single episode, unspecified: Secondary | ICD-10-CM | POA: Diagnosis not present

## 2021-01-11 DIAGNOSIS — S2222XD Fracture of body of sternum, subsequent encounter for fracture with routine healing: Secondary | ICD-10-CM | POA: Diagnosis not present

## 2021-01-11 DIAGNOSIS — Z86711 Personal history of pulmonary embolism: Secondary | ICD-10-CM | POA: Diagnosis not present

## 2021-01-11 DIAGNOSIS — M6281 Muscle weakness (generalized): Secondary | ICD-10-CM | POA: Diagnosis not present

## 2021-01-11 DIAGNOSIS — K7290 Hepatic failure, unspecified without coma: Secondary | ICD-10-CM | POA: Diagnosis not present

## 2021-01-11 DIAGNOSIS — T8189XD Other complications of procedures, not elsewhere classified, subsequent encounter: Secondary | ICD-10-CM | POA: Diagnosis not present

## 2021-01-11 DIAGNOSIS — S2231XK Fracture of one rib, right side, subsequent encounter for fracture with nonunion: Secondary | ICD-10-CM | POA: Diagnosis not present

## 2021-01-11 DIAGNOSIS — R1310 Dysphagia, unspecified: Secondary | ICD-10-CM | POA: Diagnosis not present

## 2021-01-12 ENCOUNTER — Other Ambulatory Visit: Payer: Self-pay | Admitting: Family

## 2021-01-12 DIAGNOSIS — F411 Generalized anxiety disorder: Secondary | ICD-10-CM

## 2021-01-12 DIAGNOSIS — F331 Major depressive disorder, recurrent, moderate: Secondary | ICD-10-CM

## 2021-01-14 ENCOUNTER — Telehealth: Payer: Self-pay

## 2021-01-14 ENCOUNTER — Telehealth: Payer: Self-pay | Admitting: Family Medicine

## 2021-01-14 ENCOUNTER — Ambulatory Visit (INDEPENDENT_AMBULATORY_CARE_PROVIDER_SITE_OTHER): Payer: PPO | Admitting: Family

## 2021-01-14 ENCOUNTER — Ambulatory Visit (INDEPENDENT_AMBULATORY_CARE_PROVIDER_SITE_OTHER): Payer: PPO

## 2021-01-14 ENCOUNTER — Encounter: Payer: Self-pay | Admitting: Family

## 2021-01-14 ENCOUNTER — Other Ambulatory Visit: Payer: Self-pay

## 2021-01-14 VITALS — BP 92/51 | HR 97 | Temp 97.6°F | Ht 65.0 in | Wt 183.0 lb

## 2021-01-14 DIAGNOSIS — Z86711 Personal history of pulmonary embolism: Secondary | ICD-10-CM | POA: Diagnosis not present

## 2021-01-14 DIAGNOSIS — K7290 Hepatic failure, unspecified without coma: Secondary | ICD-10-CM

## 2021-01-14 DIAGNOSIS — T8189XD Other complications of procedures, not elsewhere classified, subsequent encounter: Secondary | ICD-10-CM | POA: Diagnosis not present

## 2021-01-14 DIAGNOSIS — K7581 Nonalcoholic steatohepatitis (NASH): Secondary | ICD-10-CM | POA: Diagnosis not present

## 2021-01-14 DIAGNOSIS — K7460 Unspecified cirrhosis of liver: Secondary | ICD-10-CM

## 2021-01-14 DIAGNOSIS — J90 Pleural effusion, not elsewhere classified: Secondary | ICD-10-CM

## 2021-01-14 DIAGNOSIS — S2231XK Fracture of one rib, right side, subsequent encounter for fracture with nonunion: Secondary | ICD-10-CM | POA: Diagnosis not present

## 2021-01-14 DIAGNOSIS — R112 Nausea with vomiting, unspecified: Secondary | ICD-10-CM

## 2021-01-14 DIAGNOSIS — K766 Portal hypertension: Secondary | ICD-10-CM | POA: Diagnosis not present

## 2021-01-14 DIAGNOSIS — B370 Candidal stomatitis: Secondary | ICD-10-CM

## 2021-01-14 DIAGNOSIS — S2222XD Fracture of body of sternum, subsequent encounter for fracture with routine healing: Secondary | ICD-10-CM | POA: Diagnosis not present

## 2021-01-14 DIAGNOSIS — M6281 Muscle weakness (generalized): Secondary | ICD-10-CM | POA: Diagnosis not present

## 2021-01-14 DIAGNOSIS — Z515 Encounter for palliative care: Secondary | ICD-10-CM | POA: Diagnosis not present

## 2021-01-14 DIAGNOSIS — F329 Major depressive disorder, single episode, unspecified: Secondary | ICD-10-CM | POA: Diagnosis not present

## 2021-01-14 DIAGNOSIS — Z09 Encounter for follow-up examination after completed treatment for conditions other than malignant neoplasm: Secondary | ICD-10-CM

## 2021-01-14 DIAGNOSIS — K7682 Hepatic encephalopathy: Secondary | ICD-10-CM

## 2021-01-14 DIAGNOSIS — R1310 Dysphagia, unspecified: Secondary | ICD-10-CM | POA: Diagnosis not present

## 2021-01-14 DIAGNOSIS — R188 Other ascites: Secondary | ICD-10-CM | POA: Diagnosis not present

## 2021-01-14 DIAGNOSIS — J9811 Atelectasis: Secondary | ICD-10-CM | POA: Diagnosis not present

## 2021-01-14 DIAGNOSIS — E440 Moderate protein-calorie malnutrition: Secondary | ICD-10-CM | POA: Diagnosis not present

## 2021-01-14 DIAGNOSIS — I517 Cardiomegaly: Secondary | ICD-10-CM | POA: Diagnosis not present

## 2021-01-14 LAB — COAGUCHEK XS/INR WAIVED
INR: 1.8 — ABNORMAL HIGH (ref 0.9–1.1)
Prothrombin Time: 22 s

## 2021-01-14 MED ORDER — PROCHLORPERAZINE MALEATE 10 MG PO TABS: 10.0000 mg | ORAL_TABLET | Freq: Four times a day (QID) | ORAL | 2 refills | Status: DC | PRN

## 2021-01-14 MED ORDER — CLOTRIMAZOLE 10 MG MT TROC: 10.0000 mg | Freq: Every day | OROMUCOSAL | 3 refills | Status: DC

## 2021-01-14 MED ORDER — MIDODRINE HCL 5 MG PO TABS: 15.0000 mg | ORAL_TABLET | Freq: Three times a day (TID) | ORAL | 1 refills | Status: AC

## 2021-01-14 MED ORDER — RIFAXIMIN 550 MG PO TABS: 550.0000 mg | ORAL_TABLET | Freq: Two times a day (BID) | ORAL | 1 refills | Status: AC

## 2021-01-14 MED ORDER — ONDANSETRON 4 MG PO TBDP
4.0000 mg | ORAL_TABLET | Freq: Once | ORAL | Status: AC
  Administered 2021-01-14: 4 mg via ORAL

## 2021-01-14 NOTE — Progress Notes (Signed)
Subjective:    Patient ID: Lori Cooper, female    DOB: May 02, 1956, 65 y.o.   MRN: [id number]  No chief complaint on file.   HPI PT presents to the office today for hospital follow up. PT went to the ED on 12/09/20 with SOB. She had a CT showed a large right-sided pleural effusion with near collapse of right lower lob. She had thoracocentesis on 12/10/20. She had a liver biopsy that showed NASH cirrhosis.   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 CT surgery was consulted but did not think there is any appreciable diaphragmatic defect to fix. CTS recommended IR consult for Pleurx cath placement which was done on 12/13/2020.  She was not considered for TIPS procedure due to high MELD score of 24 with high mortality rate.   Hospital course also complicated by renal failure raising concern for hepatorenal syndrome, hepatic encephalopathy, aspiration pneumonia, coagulopathy and delirium with agitation.  Given a poor prognosis, palliative medicine consulted and met with daughter who would like to discuss goal of care with the rest of the family before making decision.  Palliative referral ordered.  Pleurx cath dislodged sometime on 01/06/2021."  Family refused SNF and wanted to take her home. She was discharged on 01/10/21.  Pt is extremely weakness.   Her Eliquis was d/c because of her liver function.   She has an appt with Cardiothoracic surgery on 03/03 and  GI on 01/17/21.  She has Home Health and has been at the house once.   Palliative team came to the home today.    Daughter is very upset today, because of her mother's health.  She states her mother can not tolerate the lactulose and wants to try Xifaxan.   Review of Systems  Constitutional: Positive for fatigue.  Gastrointestinal: Positive for nausea.  All other systems reviewed and are negative.  Family History  Problem Relation Age of  Onset  . Hypertension Mother   . Kidney disease Mother   . Diabetes Mother   . Pulmonary embolism Father 9  . Obesity Sister    . Social History   Socioeconomic History  . Marital status: Married    Spouse name: Juanda Crumble  . Number of children: 1  . Years of education: 61  . Highest education level: GED or equivalent  Occupational History  . Occupation: diability  Tobacco Use  . Smoking status: Former Smoker    Packs/day: 0.50    Years: 40.00    Pack years: 20.00    Types: Cigarettes    Start date: 02/20/1973    Quit date: 06/16/2013    Years since quitting: 7.5  . Smokeless tobacco: Never Used  Vaping Use  . Vaping Use: Former  Substance and Sexual Activity  . Alcohol use: No  . Drug use: No  . Sexual activity: Not Currently  Other Topics Concern  . Not on file  Social History Narrative  . Not on file   Social Determinants of Health   Financial Resource Strain: Low Risk   . Difficulty of Paying Living Expenses: Not hard at all  Food Insecurity: No Food Insecurity  . Worried About Charity fundraiser in the Last Year: Never true  . Ran Out of Food in the Last Year: Never true  Transportation Needs: No Transportation Needs  . Lack of Transportation (Medical): No  . Lack of Transportation (Non-Medical): No  Physical Activity: Inactive  .  Days of Exercise per Week: 0 days  . Minutes of Exercise per Session: 0 min  Stress: No Stress Concern Present  . Feeling of Stress : Only a little  Social Connections: Moderately Integrated  . Frequency of Communication with Friends and Family: More than three times a week  . Frequency of Social Gatherings with Friends and Family: More than three times a week  . Attends Religious Services: 1 to 4 times per year  . Active Member of Clubs or Organizations: No  . Attends Archivist Meetings: Never  . Marital Status: Married       Objective:   Physical Exam Vitals reviewed.  Constitutional:      General: She is not  in acute distress.    Appearance: She is well-developed and well-nourished.  HENT:     Head: Normocephalic and atraumatic.     Mouth/Throat:     Mouth: Oropharynx is clear and moist.  Eyes:     Pupils: Pupils are equal, round, and reactive to light.  Neck:     Thyroid: No thyromegaly.  Cardiovascular:     Rate and Rhythm: Normal rate and regular rhythm.     Pulses: Intact distal pulses.     Heart sounds: Normal heart sounds. No murmur heard.   Pulmonary:     Effort: Pulmonary effort is normal. No respiratory distress.     Breath sounds: Normal breath sounds. No wheezing.  Abdominal:     General: Bowel sounds are normal. There is no distension.     Palpations: Abdomen is soft.     Tenderness: There is no abdominal tenderness.  Musculoskeletal:        General: No tenderness or edema. Normal range of motion.     Cervical back: Normal range of motion and neck supple.  Skin:    General: Skin is warm and dry.     Coloration: Skin is jaundiced.     Comments: Right wound of old chest tube approx 1.2X0.4, no warmth, drainage noted, or fever  Neurological:     Mental Status: She is alert and oriented to person, place, and time.     Cranial Nerves: No cranial nerve deficit.     Motor: Weakness present.     Deep Tendon Reflexes: Reflexes are normal and symmetric.  Psychiatric:        Mood and Affect: Mood and affect normal.        Behavior: Behavior normal.        Thought Content: Thought content normal.        Judgment: Judgment normal.       BP (!) 92/51   Pulse 97   Temp 97.6 F (36.4 C) (Temporal)   Ht 5' 5" (1.651 m)   Wt 183 lb (83 kg)   BMI 30.45 kg/m      Assessment & Plan:  Lori Cooper comes in today with chief complaint of Hospitalization Follow-up   Diagnosis and orders addressed:  1. Hospital discharge follow-up - DG Chest 2 View; Future - CBC with Differential/Platelet - CoaguChek XS/INR Waived - Magnesium - VITAMIN D 25 Hydroxy (Vit-D Deficiency,  Fractures) - BMP8+EGFR - Hepatic function panel  2. Pleural effusion - DG Chest 2 View; Future - CBC with Differential/Platelet - CoaguChek XS/INR Waived - Magnesium - VITAMIN D 25 Hydroxy (Vit-D Deficiency, Fractures) - BMP8+EGFR - Hepatic function panel  3. Pleural effusion on right - CBC with Differential/Platelet - CoaguChek XS/INR Waived - Magnesium - TSH - VITAMIN  D 25 Hydroxy (Vit-D Deficiency, Fractures) - BMP8+EGFR - Hepatic function panel  4. Cirrhosis of liver with ascites, unspecified hepatic cirrhosis type (HCC) - CBC with Differential/Platelet - CoaguChek XS/INR Waived - Magnesium - Vitamin B12 - TSH - VITAMIN D 25 Hydroxy (Vit-D Deficiency, Fractures) - BMP8+EGFR - Hepatic function panel - rifaximin (XIFAXAN) 550 MG TABS tablet; Take 1 tablet (550 mg total) by mouth 2 (two) times daily.  Dispense: 60 tablet; Refill: 1  5. Oral thrush - CBC with Differential/Platelet - clotrimazole (MYCELEX) 10 MG troche; Take 1 tablet (10 mg total) by mouth 5 (five) times daily.  Dispense: 60 Troche; Refill: 3 - VITAMIN D 25 Hydroxy (Vit-D Deficiency, Fractures) - BMP8+EGFR - Hepatic function panel  6. Nausea and vomiting, intractability of vomiting not specified, unspecified vomiting type - CBC with Differential/Platelet - prochlorperazine (COMPAZINE) 10 MG tablet; Take 1 tablet (10 mg total) by mouth every 6 (six) hours as needed for nausea or vomiting.  Dispense: 60 tablet; Refill: 2 - ondansetron (ZOFRAN-ODT) disintegrating tablet 4 mg - VITAMIN D 25 Hydroxy (Vit-D Deficiency, Fractures) - BMP8+EGFR - Hepatic function panel  7. Hepatic encephalopathy ([place]) -Will try Xifaxan. Told to keep follow up with GI - rifaximin (XIFAXAN) 550 MG TABS tablet; Take 1 tablet (550 mg total) by mouth 2 (two) times daily.  Dispense: 60 tablet; Refill: 1  Compazine given for nausea Xifaxen written for patient. Keep GI appt mycelex sent to pharmacy Continue to work with  Palliative and [place]. To discuss goals. Daughter does not want "comfort measures" and wants to discuss all her options.  Labs pending Spent 50 mins with patient and daughter, discussing labs, hospital discharge, and cirrhosis.  Health Maintenance reviewed Diet and exercise encouraged  Follow up plan: 1 month    Evelina Dun, FNP

## 2021-01-14 NOTE — Patient Instructions (Signed)
Cirrhosis  Cirrhosis is long-term (chronic) liver injury. The liver is the body's largest internal organ, and it performs many functions. It converts food into energy, removes toxic material from the blood, makes important proteins, and absorbs necessary vitamins from food. In cirrhosis, healthy liver cells are replaced by scar tissue. This prevents blood from flowing through the liver and makes it difficult for the liver to complete its functions. What are the causes? Common causes of this condition are hepatitis C and long-term alcohol abuse. Other causes include:  Nonalcoholic fatty liver disease (NAFLD). This happens when fat is deposited in the liver by causes other than alcohol.  Hepatitis B infection.  Autoimmune hepatitis. In this condition, the body's defense system (immune system) mistakenly attacks the liver cells, causing inflammation.  Diseases that cause blockage of ducts inside the liver.  Inherited liver diseases, such as hemochromatosis. This is one of the most common inherited liver diseases. In this disease, deposits of iron collect in the liver and other organs.  Reactions to certain long-term medicines, such as amiodarone, a heart medicine.  Parasitic infections. These include schistosomiasis, which is caused by a flatworm.  Long-term contact to certain toxins. These toxins include certain organic solvents, such as toluene and chloroform. What increases the risk? You are more likely to develop this condition if:  You have certain types of viral hepatitis.  You abuse alcohol, especially if you are female.  You are overweight.  You use IV drugs and share needles.  You have unprotected sex with someone who has viral hepatitis. What are the signs or symptoms? You may not have any signs and symptoms at first. Symptoms may not develop until the damage to your liver starts to get worse. Early symptoms may include:  Weakness and tiredness (fatigue).  Changes in  sleep patterns or having trouble sleeping.  Itchiness.  Tenderness in the right-upper part of your abdomen.  Weight loss and muscle loss.  Nausea.  Loss of appetite. Later symptoms may include:  Fatigue or weakness that is getting worse.  Yellow skin and eyes (jaundice).  Buildup of fluid in the abdomen (ascites). You may notice that your clothes are tight around your waist.  Weight gain and swelling of the feet and ankles (edema).  Trouble breathing.  Easy bruising and bleeding.  Vomiting blood, or black or bloody stool.  Mental confusion. How is this diagnosed? Your health care provider may suspect cirrhosis based on your symptoms and medical history, especially if you have other medical conditions or a history of alcohol abuse. Your health care provider will do a physical exam to feel your liver and to check for signs of cirrhosis. Tests may include:  Blood tests to check: ? For hepatitis B or C. ? Kidney function. ? Liver function.  Imaging tests such as: ? MRI or CT scan to look for changes seen in advanced cirrhosis. ? Ultrasound to see if normal liver tissue is being replaced by scar tissue.  A procedure in which a long needle is used to take a sample of liver tissue to be checked in a lab (biopsy). Liver biopsy can confirm the diagnosis of cirrhosis. How is this treated? Treatment for this condition depends on how damaged your liver is and what caused the damage. It may include treating the symptoms of cirrhosis, or treating the underlying causes to slow the damage. Treatment may include:  Making lifestyle changes, such as: ? Eating a healthy diet. You may need to work with your health care   provider or a dietitian to develop an eating plan. ? Restricting salt intake. ? Maintaining a healthy weight. ? Not abusing drugs or alcohol.  Taking medicines to: ? Treat liver infections or other infections. ? Control itching. ? Reduce fluid buildup. ? Reduce certain  blood toxins. ? Reduce risk of bleeding from enlarged blood vessels in the stomach or esophagus (varices).  Liver transplant. In this procedure, a liver from a donor is used to replace your diseased liver. This is done if cirrhosis has caused liver failure. Other treatments and procedures may be done depending on the problems that you get from cirrhosis. Common problems include liver-related kidney failure (hepatorenal syndrome). Follow these instructions at home:  Take medicines only as told by your health care provider. Do not use medicines that are toxic to your liver. Ask your health care provider before taking any new medicines, including over-the-counter medicines such as NSAIDs.  Rest as needed.  Eat a well-balanced diet.  Limit your salt or water intake, if your health care provider asks you to do this.  Do not drink alcohol. This is especially important if you routinely take acetaminophen.  Keep all follow-up visits. This is important.   Contact a health care provider if you:  Have fatigue or weakness that is getting worse.  Develop swelling of the hands, feet, or legs, or a buildup of fluid in the abdomen (ascites).  Have a fever or chills.  Develop loss of appetite.  Have nausea or vomiting.  Develop jaundice.  Develop easy bruising or bleeding. Get help right away if you:  Vomit bright red blood or a material that looks like coffee grounds.  Have blood in your stools.  Notice that your stools appear black and tarry.  Become confused.  Have chest pain or trouble breathing. These symptoms may represent a serious problem that is an emergency. Do not wait to see if the symptoms will go away. Get medical help right away. Call your local emergency services (911 in the [place] Do not drive yourself to the hospital. Summary  Cirrhosis is chronic liver injury. Common causes are hepatitis C and long-term alcohol abuse.  Tests used to diagnose cirrhosis include blood  tests, imaging tests, and liver biopsy.  Treatment for this condition involves treating the underlying cause. Avoid alcohol, drugs, salt, and medicines that may damage your liver.  Get help right away if you vomit bright red blood or a material that looks like coffee grounds. This information is not intended to replace advice given to you by your health care provider. Make sure you discuss any questions you have with your health care provider. Document Revised: 08/15/2020 Document Reviewed: 08/15/2020 Elsevier Patient Education  2021 [employer]  

## 2021-01-15 ENCOUNTER — Other Ambulatory Visit: Payer: Self-pay | Admitting: Cardiothoracic Surgery

## 2021-01-15 DIAGNOSIS — J90 Pleural effusion, not elsewhere classified: Secondary | ICD-10-CM

## 2021-01-15 DIAGNOSIS — Z86711 Personal history of pulmonary embolism: Secondary | ICD-10-CM

## 2021-01-15 LAB — CBC WITH DIFFERENTIAL/PLATELET
Basophils Absolute: 0.1 10*3/uL (ref 0.0–0.2)
Basos: 0 %
EOS (ABSOLUTE): 0.1 10*3/uL (ref 0.0–0.4)
Eos: 0 %
Hematocrit: 30 % — ABNORMAL LOW (ref 34.0–46.6)
Hemoglobin: 10.8 g/dL — ABNORMAL LOW (ref 11.1–15.9)
Immature Grans (Abs): 0.1 10*3/uL (ref 0.0–0.1)
Immature Granulocytes: 1 %
Lymphocytes Absolute: 1.7 10*3/uL (ref 0.7–3.1)
Lymphs: 9 %
MCH: 29.8 pg (ref 26.6–33.0)
MCHC: 36 g/dL — ABNORMAL HIGH (ref 31.5–35.7)
MCV: 83 fL (ref 79–97)
Monocytes Absolute: 1 10*3/uL — ABNORMAL HIGH (ref 0.1–0.9)
Monocytes: 6 %
Neutrophils Absolute: 15.7 10*3/uL — ABNORMAL HIGH (ref 1.4–7.0)
Neutrophils: 84 %
Platelets: 320 10*3/uL (ref 150–450)
RBC: 3.63 x10E6/uL — ABNORMAL LOW (ref 3.77–5.28)
RDW: 25.2 % — ABNORMAL HIGH (ref 11.7–15.4)
WBC: 18.6 10*3/uL — ABNORMAL HIGH (ref 3.4–10.8)

## 2021-01-15 LAB — HEPATIC FUNCTION PANEL
ALT: 101 IU/L — ABNORMAL HIGH (ref 0–32)
AST: 380 IU/L — ABNORMAL HIGH (ref 0–40)
Albumin: 3.7 g/dL — ABNORMAL LOW (ref 3.8–4.8)
Alkaline Phosphatase: 222 IU/L — ABNORMAL HIGH (ref 44–121)
Bilirubin Total: 6.5 mg/dL — ABNORMAL HIGH (ref 0.0–1.2)
Bilirubin, Direct: 2.86 mg/dL — ABNORMAL HIGH (ref 0.00–0.40)
Total Protein: 6.9 g/dL (ref 6.0–8.5)

## 2021-01-15 LAB — BMP8+EGFR
BUN/Creatinine Ratio: 13 (ref 12–28)
BUN: 26 mg/dL (ref 8–27)
CO2: 12 mmol/L — ABNORMAL LOW (ref 20–29)
Calcium: 9.3 mg/dL (ref 8.7–10.3)
Chloride: 101 mmol/L (ref 96–106)
Creatinine, Ser: 1.96 mg/dL — ABNORMAL HIGH (ref 0.57–1.00)
Glucose: 127 mg/dL — ABNORMAL HIGH (ref 65–99)
Potassium: 4 mmol/L (ref 3.5–5.2)
Sodium: 131 mmol/L — ABNORMAL LOW (ref 134–144)
eGFR: 28 mL/min/{1.73_m2} — ABNORMAL LOW (ref >59)

## 2021-01-15 LAB — VITAMIN B12: Vitamin B-12: >2000 pg/mL — ABNORMAL HIGH (ref 232–1245)

## 2021-01-15 LAB — MAGNESIUM: Magnesium: 1.8 mg/dL (ref 1.6–2.3)

## 2021-01-15 LAB — VITAMIN D 25 HYDROXY (VIT D DEFICIENCY, FRACTURES): Vit D, 25-Hydroxy: 19.7 ng/mL — ABNORMAL LOW (ref 30.0–100.0)

## 2021-01-15 LAB — TSH: TSH: 4.35 u[IU]/mL (ref 0.450–4.500)

## 2021-01-15 NOTE — Telephone Encounter (Signed)
Informed daughter that per office visit notes Alyse Low would like patient to follow up in one month.

## 2021-01-16 ENCOUNTER — Other Ambulatory Visit: Payer: Self-pay

## 2021-01-16 ENCOUNTER — Other Ambulatory Visit: Payer: Self-pay | Admitting: Family

## 2021-01-16 ENCOUNTER — Encounter: Payer: Self-pay | Admitting: Cardiothoracic Surgery

## 2021-01-16 ENCOUNTER — Ambulatory Visit: Payer: PPO | Admitting: Cardiothoracic Surgery

## 2021-01-16 VITALS — BP 92/59 | HR 81 | Temp 98.3°F | Resp 20 | Wt 183.0 lb

## 2021-01-16 DIAGNOSIS — N184 Chronic kidney disease, stage 4 (severe): Secondary | ICD-10-CM

## 2021-01-16 DIAGNOSIS — J90 Pleural effusion, not elsewhere classified: Secondary | ICD-10-CM
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
## 2021-01-16 NOTE — Telephone Encounter (Signed)
This is fine with me   

## 2021-01-17 ENCOUNTER — Other Ambulatory Visit: Payer: PPO

## 2021-01-17 ENCOUNTER — Ambulatory Visit (INDEPENDENT_AMBULATORY_CARE_PROVIDER_SITE_OTHER): Payer: PPO | Admitting: Physician Assistant

## 2021-01-17 ENCOUNTER — Encounter: Payer: Self-pay | Admitting: Physician Assistant

## 2021-01-17 VITALS — BP 100/56 | HR 68 | Ht 65.0 in | Wt 183.0 lb

## 2021-01-17 DIAGNOSIS — R112 Nausea with vomiting, unspecified: Secondary | ICD-10-CM | POA: Diagnosis not present

## 2021-01-17 DIAGNOSIS — B370 Candidal stomatitis: Secondary | ICD-10-CM

## 2021-01-17 DIAGNOSIS — K7460 Unspecified cirrhosis of liver: Secondary | ICD-10-CM

## 2021-01-17 DIAGNOSIS — K7290 Hepatic failure, unspecified without coma: Secondary | ICD-10-CM | POA: Diagnosis not present

## 2021-01-17 MED ORDER — ONDANSETRON HCL 4 MG PO TABS: 4.0000 mg | ORAL_TABLET | Freq: Three times a day (TID) | ORAL | 5 refills | Status: DC | PRN

## 2021-01-17 NOTE — Patient Instructions (Addendum)
If you are age 66 or older, your body mass index should be between 23-30. Your Body mass index is 30.45 kg/m. If this is out of the aforementioned range listed, please consider follow up with your Primary Care Provider.  If you are age 72 or younger, your body mass index should be between 19-25. Your Body mass index is 30.45 kg/m. If this is out of the aformentioned range listed, please consider follow up with your Primary Care Provider.   Your provider has requested that you go to the basement level for lab work before leaving today. Press "B" on the elevator. The lab is located at the first door on the left as you exit the elevator.  We have sent the following medications to your pharmacy for you to pick up at your convenience: Zofran   Please start Zinc 220 mg twice daily this can be helpful  Hepat[place] on [employer] Glucerna  Or boost Breakfast shakes   [facility] referral will be expedited   Due to recent changes in healthcare laws, you may see the results of your imaging and laboratory studies on MyChart before your provider has had a chance to review them.  We understand that in some cases there may be results that are confusing or concerning to you. Not all laboratory results come back in the same time frame and the provider may be waiting for multiple results in order to interpret others.  Please give [place] 48 hours in order for your provider to thoroughly review all the results before contacting the office for clarification of your results.   Thank you for choosing me and [facility].  Ellouise Newer, PA-C
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
## 2021-01-18 NOTE — Progress Notes (Addendum)
Attending Physician's Attestation   I have reviewed the chart.   I agree with the Advanced Practitioner's note, impression, and recommendations with any updates as below.  Very complex individual with unfortunate recent hospitalization in the setting of finding of new advanced cirrhosis with portal hypertension based on portal pressure gradient evaluation at the time of biopsy after presenting with recurrent pleural effusion requiring Pleurx catheter drainage.  Unfortunately, due to her progressive AKI and synthetic function, she was not felt to be a TIPS candidate.  She had a protracted hospitalization with PT/OT suggesting potential role of SNF and rehabilitation but patient was taken home per family desires.  Palliative medicine consulted in discussion with patient and family due to overall poor prognosis in the setting of her hospitalization.  During his hospitalization it was felt that the patient may have AKI as a result of hepatorenal syndrome and was partially treated for this during her hospitalization.  Diuretic therapy could not be reinitiated as a result of her AKI.  There is documentation in the chart that there was one drains by the family if transplant evaluation could be considered but this was never offered during the hospitalization and the patient was never transferred to a facility that could offer consideration of transplantation.  Since the patient's discharge she has followed up with PCP office and still has elevated kidney dysfunction.  It is not clear if this is ongoing type II HRS at this time versus a new baseline.  Patient's daughter is most concerned about the weight loss and nausea and vomiting that has persisted as well as the significant diarrhea.  She is unable to have her mother take lactulose because she is having multiple bowel movements from this.  She was going to try to get rifaximin but it is too expensive from a financial perspective for the patient and family.   Significant nausea is causing her issues with inability to tolerate eating at this time.  Even with antiemetics on board.  Unfortunately, there needs to be some sort of therapy for the consideration of ammonia distribution in her body and so we have recommended seeing therapy as well as HepatoLOLA (this is a bonded ornithine-aspartate BCAA supplement, which may lower plasma ammonia concentrations through enhancement of ammonia metabolism and glutamine in the liver).  Since the patient is having difficulty maintaining the lactulose therapy due to diarrhea this can be helpful.  Typical therapy is 6 g of HepatoLOLA 3 times daily but it will need to be seen if the patient can even tolerate this.  Her creatinine is elevated but as long as it remains less than 3.0, this therapy can be considered and used at least for a period in time to see if that may help with concern for portosystemic encephalopathy.  Thus it is important in a couple weeks time to recheck the kidney function and ensure that her creatinine is not worsening.  If it is then  HepatoLOLA will need to be stopped and she will need referral to nephrology if that is not already set in motion from her prior hospitalization.  We also discussed the hepatitis serologies and hepatitis B previous infection that it looks like she had at some point due to her hepatitis B core antibody but negative HBV DNA.  We also discussed the results of her biopsy.  The patient's daughter wanted the report of the previous surgical interventions that were performed last year as well where she underwent a Meckel's diverticulotomy and ileocecectomy.  If the  patient's diarrhea persists, it may be worthwhile to consider use of a bile acid resin such as cholestyramine or WelChol due to her history of ileocecectomy.  The daughter's preference is for her to return to the [facility] whom has performed previous colonoscopy on the patient and follow-up with Dr. Laural Golden who she feels  more comfortable with rather than further follow-up here in [place].  I have significant concern as to whether this patient will not make her way back into the hospital within the coming days or weeks due to her overall debilitated status and underlying chronic liver disease.  Further discussions in the future in regards to goals of care and palliative medicine outpatient evaluation may be reasonable but today was not the day to further discuss this with the patient's daughter or patient.  We will place a referral to [facility] hepatology for consideration of further work-up and management and whether the patient could be a transplant candidate though I think this will be unlikely we will be happy to place referral on their behalf.  The patient's daughter will follow up with the regional GI group in regards to following up with Dr. Laural Golden.  Moving forward.  If she continues to have progressive nausea vomiting or has progressive mental status decline she needs to be returned to the hospital for further evaluation.   Justice Britain, MD [facility] Advanced Endoscopy Office # [id number]

## 2021-01-20 ENCOUNTER — Telehealth: Payer: Self-pay | Admitting: *Deleted

## 2021-01-20 NOTE — Telephone Encounter (Signed)
Vm from Stacy w/ [facility] Daughter asked PT if lactulose would work better is suppository form vs method using now Please call daughter back with advise

## 2021-01-20 NOTE — Telephone Encounter (Signed)
I will probably see the right now I cannot take on any new patients because my panel is full.

## 2021-01-20 NOTE — Telephone Encounter (Signed)
Pts husband informed and understood. He will make wife aware.

## 2021-01-21 ENCOUNTER — Encounter (INDEPENDENT_AMBULATORY_CARE_PROVIDER_SITE_OTHER): Payer: Self-pay | Admitting: Internal Medicine

## 2021-01-21 ENCOUNTER — Other Ambulatory Visit (INDEPENDENT_AMBULATORY_CARE_PROVIDER_SITE_OTHER): Payer: Self-pay | Admitting: *Deleted

## 2021-01-21 ENCOUNTER — Ambulatory Visit (INDEPENDENT_AMBULATORY_CARE_PROVIDER_SITE_OTHER): Payer: PPO

## 2021-01-21 ENCOUNTER — Other Ambulatory Visit: Payer: Self-pay

## 2021-01-21 ENCOUNTER — Ambulatory Visit (INDEPENDENT_AMBULATORY_CARE_PROVIDER_SITE_OTHER): Payer: PPO | Admitting: Internal Medicine

## 2021-01-21 VITALS — BP 96/61 | HR 81 | Temp 97.3°F | Ht 65.0 in

## 2021-01-21 DIAGNOSIS — R1310 Dysphagia, unspecified: Secondary | ICD-10-CM

## 2021-01-21 DIAGNOSIS — K7581 Nonalcoholic steatohepatitis (NASH): Secondary | ICD-10-CM

## 2021-01-21 DIAGNOSIS — K7290 Hepatic failure, unspecified without coma: Secondary | ICD-10-CM

## 2021-01-21 DIAGNOSIS — K7682 Hepatic encephalopathy: Secondary | ICD-10-CM | POA: Insufficient documentation

## 2021-01-21 DIAGNOSIS — K831 Obstruction of bile duct: Secondary | ICD-10-CM

## 2021-01-21 DIAGNOSIS — R112 Nausea with vomiting, unspecified: Secondary | ICD-10-CM

## 2021-01-21 DIAGNOSIS — Z86711 Personal history of pulmonary embolism: Secondary | ICD-10-CM | POA: Diagnosis not present

## 2021-01-21 DIAGNOSIS — K766 Portal hypertension: Secondary | ICD-10-CM

## 2021-01-21 DIAGNOSIS — M6281 Muscle weakness (generalized): Secondary | ICD-10-CM

## 2021-01-21 DIAGNOSIS — R188 Other ascites: Secondary | ICD-10-CM | POA: Diagnosis not present

## 2021-01-21 DIAGNOSIS — K7460 Unspecified cirrhosis of liver: Secondary | ICD-10-CM

## 2021-01-21 DIAGNOSIS — F329 Major depressive disorder, single episode, unspecified: Secondary | ICD-10-CM | POA: Diagnosis not present

## 2021-01-21 DIAGNOSIS — S2222XD Fracture of body of sternum, subsequent encounter for fracture with routine healing: Secondary | ICD-10-CM

## 2021-01-21 DIAGNOSIS — T8189XD Other complications of procedures, not elsewhere classified, subsequent encounter: Secondary | ICD-10-CM | POA: Diagnosis not present

## 2021-01-21 DIAGNOSIS — S2231XK Fracture of one rib, right side, subsequent encounter for fracture with nonunion: Secondary | ICD-10-CM

## 2021-01-21 NOTE — Patient Instructions (Signed)
Please discuss with Ms. Lori Cooper if Prozac dose could be reduced. Physician will call with results of blood work. Referral to [facility]

## 2021-01-21 NOTE — Telephone Encounter (Signed)
Left stacy detailed message

## 2021-01-21 NOTE — Telephone Encounter (Signed)
I recommend her call her GI about changing this medication.
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
## 2021-01-23 DIAGNOSIS — R188 Other ascites: Secondary | ICD-10-CM | POA: Diagnosis not present

## 2021-01-23 DIAGNOSIS — R112 Nausea with vomiting, unspecified: Secondary | ICD-10-CM | POA: Diagnosis not present

## 2021-01-23 DIAGNOSIS — K7290 Hepatic failure, unspecified without coma: Secondary | ICD-10-CM | POA: Diagnosis not present

## 2021-01-23 DIAGNOSIS — K831 Obstruction of bile duct: Secondary | ICD-10-CM | POA: Diagnosis not present

## 2021-01-23 DIAGNOSIS — K7460 Unspecified cirrhosis of liver: Secondary | ICD-10-CM | POA: Diagnosis not present

## 2021-01-24 ENCOUNTER — Inpatient Hospital Stay (HOSPITAL_COMMUNITY)
Admission: EM | Admit: 2021-01-24 | Discharge: 2021-01-25 | DRG: 441 | Disposition: A | Discharge status: Other Acute Inpatient Hospital | Payer: PPO | Attending: Internal Medicine | Admitting: Internal Medicine

## 2021-01-24 ENCOUNTER — Other Ambulatory Visit: Payer: Self-pay

## 2021-01-24 ENCOUNTER — Emergency Department (HOSPITAL_COMMUNITY): Payer: PPO

## 2021-01-24 ENCOUNTER — Encounter (HOSPITAL_COMMUNITY): Payer: Self-pay | Admitting: Emergency Medicine

## 2021-01-24 DIAGNOSIS — Z79899 Other long term (current) drug therapy: Secondary | ICD-10-CM | POA: Diagnosis not present

## 2021-01-24 DIAGNOSIS — Z841 Family history of disorders of kidney and ureter: Secondary | ICD-10-CM | POA: Diagnosis not present

## 2021-01-24 DIAGNOSIS — N1832 Chronic kidney disease, stage 3b: Secondary | ICD-10-CM | POA: Diagnosis present

## 2021-01-24 DIAGNOSIS — E871 Hypo-osmolality and hyponatremia: Secondary | ICD-10-CM | POA: Diagnosis not present

## 2021-01-24 DIAGNOSIS — G9341 Metabolic encephalopathy: Secondary | ICD-10-CM | POA: Diagnosis present

## 2021-01-24 DIAGNOSIS — R6520 Severe sepsis without septic shock: Secondary | ICD-10-CM | POA: Diagnosis not present

## 2021-01-24 DIAGNOSIS — I6381 Other cerebral infarction due to occlusion or stenosis of small artery: Secondary | ICD-10-CM | POA: Diagnosis not present

## 2021-01-24 DIAGNOSIS — R0689 Other abnormalities of breathing: Secondary | ICD-10-CM | POA: Diagnosis not present

## 2021-01-24 DIAGNOSIS — J189 Pneumonia, unspecified organism: Secondary | ICD-10-CM | POA: Diagnosis not present

## 2021-01-24 DIAGNOSIS — K7460 Unspecified cirrhosis of liver: Secondary | ICD-10-CM

## 2021-01-24 DIAGNOSIS — I959 Hypotension, unspecified: Secondary | ICD-10-CM | POA: Diagnosis not present

## 2021-01-24 DIAGNOSIS — R791 Abnormal coagulation profile: Secondary | ICD-10-CM

## 2021-01-24 DIAGNOSIS — K7469 Other cirrhosis of liver: Secondary | ICD-10-CM | POA: Diagnosis present

## 2021-01-24 DIAGNOSIS — Z86711 Personal history of pulmonary embolism: Secondary | ICD-10-CM

## 2021-01-24 DIAGNOSIS — Z9911 Dependence on respirator [ventilator] status: Secondary | ICD-10-CM | POA: Diagnosis not present

## 2021-01-24 DIAGNOSIS — E440 Moderate protein-calorie malnutrition: Secondary | ICD-10-CM | POA: Diagnosis present

## 2021-01-24 DIAGNOSIS — Z4682 Encounter for fitting and adjustment of non-vascular catheter: Secondary | ICD-10-CM | POA: Diagnosis not present

## 2021-01-24 DIAGNOSIS — N179 Acute kidney failure, unspecified: Secondary | ICD-10-CM | POA: Diagnosis not present

## 2021-01-24 DIAGNOSIS — K766 Portal hypertension: Secondary | ICD-10-CM | POA: Diagnosis not present

## 2021-01-24 DIAGNOSIS — A419 Sepsis, unspecified organism: Secondary | ICD-10-CM | POA: Diagnosis not present

## 2021-01-24 DIAGNOSIS — E785 Hyperlipidemia, unspecified: Secondary | ICD-10-CM | POA: Diagnosis not present

## 2021-01-24 DIAGNOSIS — K219 Gastro-esophageal reflux disease without esophagitis: Secondary | ICD-10-CM | POA: Diagnosis present

## 2021-01-24 DIAGNOSIS — K7200 Acute and subacute hepatic failure without coma: Principal | ICD-10-CM | POA: Diagnosis present

## 2021-01-24 DIAGNOSIS — W07XXXA Fall from chair, initial encounter: Secondary | ICD-10-CM | POA: Diagnosis present

## 2021-01-24 DIAGNOSIS — K7210 Chronic hepatic failure without coma: Secondary | ICD-10-CM

## 2021-01-24 DIAGNOSIS — Z7951 Long term (current) use of inhaled steroids: Secondary | ICD-10-CM

## 2021-01-24 DIAGNOSIS — G9349 Other encephalopathy: Secondary | ICD-10-CM | POA: Diagnosis not present

## 2021-01-24 DIAGNOSIS — Z515 Encounter for palliative care: Secondary | ICD-10-CM | POA: Diagnosis not present

## 2021-01-24 DIAGNOSIS — K7290 Hepatic failure, unspecified without coma: Principal | ICD-10-CM

## 2021-01-24 DIAGNOSIS — R188 Other ascites: Secondary | ICD-10-CM | POA: Diagnosis not present

## 2021-01-24 DIAGNOSIS — G936 Cerebral edema: Secondary | ICD-10-CM | POA: Diagnosis not present

## 2021-01-24 DIAGNOSIS — K7581 Nonalcoholic steatohepatitis (NASH): Secondary | ICD-10-CM

## 2021-01-24 DIAGNOSIS — Z20822 Contact with and (suspected) exposure to covid-19: Secondary | ICD-10-CM | POA: Diagnosis present

## 2021-01-24 DIAGNOSIS — E869 Volume depletion, unspecified: Secondary | ICD-10-CM | POA: Diagnosis present

## 2021-01-24 DIAGNOSIS — R578 Other shock: Secondary | ICD-10-CM | POA: Diagnosis not present

## 2021-01-24 DIAGNOSIS — R918 Other nonspecific abnormal finding of lung field: Secondary | ICD-10-CM | POA: Diagnosis not present

## 2021-01-24 DIAGNOSIS — Z87891 Personal history of nicotine dependence: Secondary | ICD-10-CM

## 2021-01-24 DIAGNOSIS — N189 Chronic kidney disease, unspecified: Secondary | ICD-10-CM

## 2021-01-24 DIAGNOSIS — I517 Cardiomegaly: Secondary | ICD-10-CM | POA: Diagnosis not present

## 2021-01-24 DIAGNOSIS — Z452 Encounter for adjustment and management of vascular access device: Secondary | ICD-10-CM | POA: Diagnosis not present

## 2021-01-24 DIAGNOSIS — D649 Anemia, unspecified: Secondary | ICD-10-CM | POA: Diagnosis not present

## 2021-01-24 DIAGNOSIS — R0902 Hypoxemia: Secondary | ICD-10-CM | POA: Diagnosis not present

## 2021-01-24 DIAGNOSIS — R0603 Acute respiratory distress: Secondary | ICD-10-CM | POA: Diagnosis not present

## 2021-01-24 DIAGNOSIS — Z833 Family history of diabetes mellitus: Secondary | ICD-10-CM

## 2021-01-24 DIAGNOSIS — F411 Generalized anxiety disorder: Secondary | ICD-10-CM | POA: Diagnosis present

## 2021-01-24 DIAGNOSIS — Y92009 Unspecified place in unspecified non-institutional (private) residence as the place of occurrence of the external cause: Secondary | ICD-10-CM | POA: Diagnosis not present

## 2021-01-24 DIAGNOSIS — G9389 Other specified disorders of brain: Secondary | ICD-10-CM | POA: Diagnosis not present

## 2021-01-24 DIAGNOSIS — N170 Acute kidney failure with tubular necrosis: Secondary | ICD-10-CM | POA: Diagnosis not present

## 2021-01-24 DIAGNOSIS — K767 Hepatorenal syndrome: Secondary | ICD-10-CM | POA: Diagnosis present

## 2021-01-24 DIAGNOSIS — J80 Acute respiratory distress syndrome: Secondary | ICD-10-CM | POA: Diagnosis not present

## 2021-01-24 DIAGNOSIS — W19XXXA Unspecified fall, initial encounter: Secondary | ICD-10-CM | POA: Diagnosis not present

## 2021-01-24 DIAGNOSIS — Z8249 Family history of ischemic heart disease and other diseases of the circulatory system: Secondary | ICD-10-CM | POA: Diagnosis not present

## 2021-01-24 DIAGNOSIS — E8809 Other disorders of plasma-protein metabolism, not elsewhere classified: Secondary | ICD-10-CM | POA: Diagnosis present

## 2021-01-24 DIAGNOSIS — R7303 Prediabetes: Secondary | ICD-10-CM | POA: Diagnosis not present

## 2021-01-24 DIAGNOSIS — I10 Essential (primary) hypertension: Secondary | ICD-10-CM | POA: Diagnosis not present

## 2021-01-24 DIAGNOSIS — Z6829 Body mass index (BMI) 29.0-29.9, adult: Secondary | ICD-10-CM

## 2021-01-24 DIAGNOSIS — R000 Tachycardia, unspecified: Secondary | ICD-10-CM | POA: Diagnosis not present

## 2021-01-24 DIAGNOSIS — Z66 Do not resuscitate: Secondary | ICD-10-CM | POA: Diagnosis not present

## 2021-01-24 DIAGNOSIS — I700 Atherosclerosis of aorta: Secondary | ICD-10-CM | POA: Diagnosis not present

## 2021-01-24 DIAGNOSIS — G319 Degenerative disease of nervous system, unspecified: Secondary | ICD-10-CM | POA: Diagnosis not present

## 2021-01-24 DIAGNOSIS — Z8679 Personal history of other diseases of the circulatory system: Secondary | ICD-10-CM

## 2021-01-24 DIAGNOSIS — K7201 Acute and subacute hepatic failure with coma: Secondary | ICD-10-CM | POA: Diagnosis not present

## 2021-01-24 DIAGNOSIS — K7211 Chronic hepatic failure with coma: Secondary | ICD-10-CM | POA: Diagnosis not present

## 2021-01-24 DIAGNOSIS — K7682 Hepatic encephalopathy: Secondary | ICD-10-CM

## 2021-01-24 LAB — RESP PANEL BY RT-PCR (FLU A&B, COVID) ARPGX2
Influenza A by PCR: NEGATIVE
Influenza B by PCR: NEGATIVE
SARS Coronavirus 2 by RT PCR: NEGATIVE

## 2021-01-24 LAB — COMPREHENSIVE METABOLIC PANEL
ALT: 108 U/L — ABNORMAL HIGH (ref 0–44)
AST: 284 U/L — ABNORMAL HIGH (ref 15–41)
Albumin: 2.8 g/dL — ABNORMAL LOW (ref 3.5–5.0)
Alkaline Phosphatase: 172 U/L — ABNORMAL HIGH (ref 38–126)
Anion gap: 15 (ref 5–15)
BUN: 59 mg/dL — ABNORMAL HIGH (ref 8–23)
CO2: 16 mmol/L — ABNORMAL LOW (ref 22–32)
Calcium: 8.8 mg/dL — ABNORMAL LOW (ref 8.9–10.3)
Chloride: 100 mmol/L (ref 98–111)
Creatinine, Ser: 4.14 mg/dL — ABNORMAL HIGH (ref 0.44–1.00)
GFR, Estimated: 11 mL/min — ABNORMAL LOW (ref >60)
Glucose, Bld: 132 mg/dL — ABNORMAL HIGH (ref 70–99)
Potassium: 3.5 mmol/L (ref 3.5–5.1)
Sodium: 131 mmol/L — ABNORMAL LOW (ref 135–145)
Total Bilirubin: 9.1 mg/dL — ABNORMAL HIGH (ref 0.3–1.2)
Total Protein: 6.6 g/dL (ref 6.5–8.1)

## 2021-01-24 LAB — PROTIME-INR
INR: 2 — ABNORMAL HIGH
INR: 2.2 — ABNORMAL HIGH (ref 0.8–1.2)
Prothrombin Time: 19.7 s — ABNORMAL HIGH (ref 9.0–11.5)
Prothrombin Time: 23.7 s — ABNORMAL HIGH (ref 11.4–15.2)

## 2021-01-24 LAB — CBC WITH DIFFERENTIAL/PLATELET
Abs Immature Granulocytes: 0.06 K/uL (ref 0.00–0.07)
Basophils Absolute: 0.1 K/uL (ref 0.0–0.1)
Basophils Relative: 0 %
Eosinophils Absolute: 0.1 K/uL (ref 0.0–0.5)
Eosinophils Relative: 0 %
HCT: 28.2 % — ABNORMAL LOW (ref 36.0–46.0)
Hemoglobin: 10.1 g/dL — ABNORMAL LOW (ref 12.0–15.0)
Immature Granulocytes: 0 %
Lymphocytes Relative: 8 %
Lymphs Abs: 1.2 K/uL (ref 0.7–4.0)
MCH: 31.2 pg (ref 26.0–34.0)
MCHC: 35.8 g/dL (ref 30.0–36.0)
MCV: 87 fL (ref 80.0–100.0)
Monocytes Absolute: 1.1 K/uL — ABNORMAL HIGH (ref 0.1–1.0)
Monocytes Relative: 7 %
Neutro Abs: 13.2 K/uL — ABNORMAL HIGH (ref 1.7–7.7)
Neutrophils Relative %: 85 %
Platelets: 169 K/uL (ref 150–400)
RBC: 3.24 MIL/uL — ABNORMAL LOW (ref 3.87–5.11)
RDW: 29.2 % — ABNORMAL HIGH (ref 11.5–15.5)
WBC: 15.7 K/uL — ABNORMAL HIGH (ref 4.0–10.5)
nRBC: 0 % (ref 0.0–0.2)

## 2021-01-24 LAB — CBG MONITORING, ED: Glucose-Capillary: 119 mg/dL — ABNORMAL HIGH (ref 70–99)

## 2021-01-24 LAB — AMMONIA: Ammonia: 74 umol/L — ABNORMAL HIGH (ref 9–35)

## 2021-01-24 MED ORDER — SODIUM CHLORIDE 0.9 % IV BOLUS
1000.0000 mL | Freq: Once | INTRAVENOUS | Status: AC
  Administered 2021-01-24: 1000 mL via INTRAVENOUS

## 2021-01-24 MED ORDER — LORAZEPAM 2 MG/ML IJ SOLN: 0.5000 mg | Freq: Once | INTRAMUSCULAR | Status: AC

## 2021-01-24 MED ORDER — FLUTICASONE FUROATE-VILANTEROL 100-25 MCG/INH IN AEPB
1.0000 | INHALATION_SPRAY | Freq: Every day | RESPIRATORY_TRACT | Status: DC
  Administered 2021-01-25: 08:00:00 1 via RESPIRATORY_TRACT
  Filled 2021-01-24: qty 28

## 2021-01-24 MED ORDER — MIDODRINE HCL 5 MG PO TABS
15.0000 mg | ORAL_TABLET | Freq: Three times a day (TID) | ORAL | Status: DC
  Filled 2021-01-24: qty 3

## 2021-01-24 MED ORDER — LACTULOSE ENEMA
300.0000 mL | Freq: Once | ORAL | Status: AC
  Administered 2021-01-25: 300 mL via RECTAL
  Filled 2021-01-24: qty 300

## 2021-01-24 MED ORDER — ENSURE ENLIVE PO LIQD: 237.0000 mL | Freq: Two times a day (BID) | ORAL | Status: DC

## 2021-01-24 MED ORDER — LORAZEPAM 2 MG/ML IJ SOLN
INTRAMUSCULAR | Status: AC
  Administered 2021-01-24: 0.5 mg via INTRAVENOUS
  Filled 2021-01-24: qty 1

## 2021-01-24 MED ORDER — RIFAXIMIN 550 MG PO TABS
550.0000 mg | ORAL_TABLET | Freq: Two times a day (BID) | ORAL | Status: DC
  Filled 2021-01-24: qty 1

## 2021-01-24 MED ORDER — BUSPIRONE HCL 5 MG PO TABS: 5.0000 mg | ORAL_TABLET | Freq: Three times a day (TID) | ORAL | Status: DC | PRN

## 2021-01-24 MED ORDER — THIAMINE HCL 100 MG/ML IJ SOLN
100.0000 mg | Freq: Every day | INTRAMUSCULAR | Status: DC
  Administered 2021-01-25: 100 mg via INTRAVENOUS
  Filled 2021-01-24: qty 2

## 2021-01-24 MED ORDER — FOLIC ACID 5 MG/ML IJ SOLN
1.0000 mg | Freq: Every day | INTRAMUSCULAR | Status: DC
  Administered 2021-01-25: 1 mg via INTRAVENOUS
  Filled 2021-01-24: qty 0.2

## 2021-01-24 MED ORDER — ALBUTEROL SULFATE HFA 108 (90 BASE) MCG/ACT IN AERS: 2.0000 | INHALATION_SPRAY | Freq: Four times a day (QID) | RESPIRATORY_TRACT | Status: DC | PRN

## 2021-01-24 MED ORDER — LACTULOSE 10 GM/15ML PO SOLN: 20.0000 g | Freq: Once | ORAL | Status: DC

## 2021-01-24 MED ORDER — SODIUM BICARBONATE 650 MG PO TABS
650.0000 mg | ORAL_TABLET | Freq: Three times a day (TID) | ORAL | Status: DC
  Filled 2021-01-24 (×2): qty 1

## 2021-01-24 MED ORDER — SODIUM CHLORIDE 0.9 % IV SOLN: Freq: Once | INTRAVENOUS | Status: AC

## 2021-01-24 MED ORDER — ALBUMIN HUMAN 5 % IV SOLN
50.0000 g | Freq: Once | INTRAVENOUS | Status: AC
  Administered 2021-01-25: 50 g via INTRAVENOUS
  Filled 2021-01-24: qty 1000

## 2021-01-24 MED ORDER — BUPROPION HCL ER (XL) 150 MG PO TB24: 150.0000 mg | ORAL_TABLET | Freq: Every morning | ORAL | Status: DC

## 2021-01-24 MED ORDER — LORAZEPAM 2 MG/ML IJ SOLN: 0.5000 mg | Freq: Four times a day (QID) | INTRAMUSCULAR | Status: DC | PRN

## 2021-01-24 MED ORDER — FLUOXETINE HCL 20 MG PO CAPS
40.0000 mg | ORAL_CAPSULE | Freq: Every day | ORAL | Status: DC
  Filled 2021-01-24: qty 2

## 2021-01-24 NOTE — H&P (Signed)
History and Physical  Lori Cooper [id number] DOB: May 16, 1956 DOA: 01/24/2021  Referring physician: Dr Gilford Raid, ED physician PCP: Sharion Balloon, FNP  Outpatient Specialists: Dr Laural Golden  Patient Coming From: home  Chief Complaint: Confusion  HPI: Lori Cooper is a 65 y.o. 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  Due to confusion, the patient's history was obtained from patient's husband patient has had a lot difficulty taking her lactulose over the past several weeks.  She has been trialed on couple of other medications such as rifaximin, but she cannot afford this secondary to cost.  Patient symptoms have worsened over the past 2 to 3 days.  Per family members, the patient has spit out her medication has not taken lactulose at all.  She has had gradual worsening confusion over the past couple of days.  No palliating or provoking factors.  Emergency Department Course: Sodium 131, bicarb 16, creatinine 4.14, albumin 2.8, ammonia 74, white blood cell count 15.7.  Patient was ordered lactulose, however she spit it out.  Review of Systems:  Unable to obtain secondary to confusion.  Past Medical History:  Diagnosis Date  . Anxiety   . Chronic back pain   . Closed fracture of left olecranon process 08/11/2020  . DDD (degenerative disc disease), cervical   . DDD (degenerative disc disease), lumbosacral   . Depression   . GERD (gastroesophageal reflux disease)   . Hiatal hernia   . History of adenomatous polyp of colon    2009  tubular adenoma  . History of esophagitis   . History of idiopathic seizure    1984  x1 after vaginal delivery (per pt negative work-up and no issue since)  . History of left shoulder fracture    01/ 2014  proximal humerus fx  . Hyperlipidemia   . Left ulnar fracture 08/11/2020  . OA (osteoarthritis)    knees and thumbs  . RLS (restless legs syndrome)   .  Supraumbilical hernia   . Umbilical hernia   . Wears dentures    lower   Past Surgical History:  Procedure Laterality Date  . APPLICATION OF WOUND VAC N/A 08/08/2020   Procedure: APPLICATION OF WOUND VAC;  Surgeon: Kinsinger, Arta Bruce, MD;  Location: [place];  Service: General;  Laterality: N/A;  . BIOPSY N/A 12/21/2020   Procedure: BIOPSY;  Surgeon: Irving Copas., MD;  Location: [place];  Service: Gastroenterology;  Laterality: N/A;  . CARDIOVASCULAR STRESS TEST  05/06/2010   Lexiscan nuclear study w/ no exercise/  probably normal , per images there is a large reversible defect in the mid to distal anterior wall, this seems to be shifting breast attenuation/  normal LV function and wall motion , ef 67%  . CARPAL TUNNEL RELEASE Bilateral 2011   excision ganglion cyst left wrist  . CATARACT EXTRACTION W/ INTRAOCULAR LENS  IMPLANT, BILATERAL  09 and 10/ 2017  . CHEST TUBE INSERTION Right 12/20/2020   Procedure: INSERTION PLEURAL DRAINAGE CATHETER;  Surgeon: [name], MD;  Location: [place];  Service: Thoracic;  Laterality: Right;  . COLONOSCOPY  05/08/2008  . COLONOSCOPY N/A 09/15/2018   Procedure: COLONOSCOPY;  Surgeon: Rogene Houston, MD;  [facility];  Service: Endoscopy;  Laterality: N/A;  12:00  . ESOPHAGOGASTRODUODENOSCOPY (EGD) WITH PROPOFOL N/A 12/21/2020   Procedure: ESOPHAGOGASTRODUODENOSCOPY (EGD) WITH PROPOFOL;  Surgeon: Rush Landmark Telford Nab., MD;  Location: [place];  Service: Gastroenterology;  Laterality: N/A;  . EUS N/A 12/21/2020   Procedure: UPPER ENDOSCOPIC ULTRASOUND (EUS) LINEAR;  Surgeon: Irving Copas., MD;  Location: [place];  Service: Gastroenterology;  Laterality: N/A;  . IR PERC PLEURAL DRAIN W/INDWELL CATH W/IMG GUIDE  12/13/2020  . IR TRANSCATHETER BX  12/23/2020  . IR US GUIDE VASC ACCESS RIGHT  12/23/2020  . IR VENOGRAM HEPATIC W HEMODYNAMIC EVALUATION  12/23/2020  . LAPAROSCOPIC CHOLECYSTECTOMY  2012  . LAPAROTOMY  N/A 08/08/2020   Procedure: EXPLORATORY LAPAROTOMY, ILEOCECECTOMY WITH ANASTOMOSIS, MECKELS RESSECTION;  Surgeon: Kinsinger, Arta Bruce, MD;  Location: [place];  Service: General;  Laterality: N/A;  . ORIF ELBOW FRACTURE Left 08/10/2020   Procedure: OPEN REDUCTION INTERNAL FIXATION (ORIF) OLECRANON and ULNA FRACTURE;  Surgeon: Altamese [place], MD;  Location: [place];  Service: Orthopedics;  Laterality: Left;  . SHOULDER ARTHROSCOPY/  ACROMIOPLASTY/  DISTAL CLAVICAL RESECTION/  DEBRIDEMENT LABRAL TEAR Left 02/09/2005  . TONSILLECTOMY AND ADENOIDECTOMY  age 18  . TUBAL LIGATION Bilateral yrs ago  . VENTRAL HERNIA REPAIR N/A 12/31/2016   Procedure: LAPAROSCOPIC VENTRAL WALL HERNIA REPAIR WITH ERAS PATHWAY;  Surgeon: Michael Boston, MD;  Location: [place];  Service: General;  Laterality: N/A;   Social History:  reports that she quit smoking about 7 years ago. Her smoking use included cigarettes. She started smoking about 47 years ago. She has a 20.00 pack-year smoking history. She has never used smokeless tobacco. She reports that she does not drink alcohol and does not use drugs. Patient lives at home  No Known Allergies  Family History  Problem Relation Age of Onset  . Hypertension Mother   . Kidney disease Mother   . Diabetes Mother   . Pulmonary embolism Father 54  . Obesity Sister       Prior to Admission medications   Medication Sig Start Date End Date Taking? Authorizing Provider  albuterol (PROVENTIL HFA;VENTOLIN HFA) 108 (90 Base) MCG/ACT inhaler Inhale 2 puffs into the lungs every 6 (six) hours as needed for wheezing or shortness of breath.   Yes [provider]  budesonide-formoterol (SYMBICORT) 80-4.5 MCG/ACT inhaler Inhale 2 puffs by mouth twice daily Patient taking differently: Inhale 2 puffs into the lungs 2 (two) times daily. 08/01/19  Yes Hawks, Christy A, FNP  buPROPion (WELLBUTRIN XL) 150 MG 24 hr tablet Take 1 tablet by mouth once daily Patient taking  differently: Take 150 mg by mouth every morning. 07/08/20  Yes Hawks, Christy A, FNP  busPIRone (BUSPAR) 5 MG tablet Take 1 tablet by mouth three times daily as needed 01/13/21  Yes Hawks, Christy A, FNP  Cholecalciferol (VITAMIN D) 125 MCG (5000 UT) CAPS Take 1 capsule by mouth daily. 08/23/20  Yes Ainsley Spinner, PA-C  clotrimazole (MYCELEX) 10 MG troche Take 1 tablet (10 mg total) by mouth 5 (five) times daily. 01/14/21  Yes Hawks, Christy A, FNP  FLUoxetine (PROZAC) 40 MG capsule Take 2 capsules (80 mg total) by mouth daily. Patient taking differently: Take 40 mg by mouth daily. 12/05/20  Yes Hawks, Christy A, FNP  lactulose (CHRONULAC) 10 GM/15ML solution Take 20 g (30 mL) 2-3 times a day for goal of 2-3 bowel movements a day 01/10/21  Yes Mercy Riding, MD  meclizine (ANTIVERT) 25 MG tablet Take 25 mg by mouth 3 (three) times daily as needed for dizziness.   Yes [provider]  Menthol, Topical Analgesic, (BIOFREEZE ROLL-ON EX) Apply 1 application topically daily as needed (pain).  Yes [provider]  midodrine (PROAMATINE) 5 MG tablet Take 3 tablets (15 mg total) by mouth 3 (three) times daily with meals. 01/14/21  Yes Hawks, Christy A, FNP  Multiple Vitamin (MULTIVITAMIN WITH MINERALS) TABS tablet Take 1 tablet by mouth every morning.   Yes [provider]  ondansetron (ZOFRAN) 4 MG tablet Take 1 tablet (4 mg total) by mouth every 8 (eight) hours as needed for nausea or vomiting. 01/17/21  Yes [name], PA  pantoprazole (PROTONIX) 20 MG tablet Take 1 tablet by mouth once daily Patient taking differently: Take 20 mg by mouth every morning. 07/08/20  Yes Hawks, Christy A, FNP  rifaximin (XIFAXAN) 550 MG TABS tablet Take 1 tablet (550 mg total) by mouth 2 (two) times daily. 01/14/21  Yes Hawks, Christy A, FNP  sodium bicarbonate 650 MG tablet Take 1 tablet (650 mg total) by mouth 3 (three) times daily. 01/10/21  Yes Mercy Riding, MD  diclofenac Sodium (VOLTAREN) 1 %  GEL Apply 1 application topically 2 (two) times daily as needed (pain). Patient not taking: Reported on 01/24/2021    [provider]  famotidine (PEPCID) 20 MG tablet Take 1 tablet (20 mg total) by mouth at bedtime. Patient not taking: Reported on 01/24/2021 03/01/20   Sharion Balloon, FNP    Physical Exam: BP (!) 129/59   Pulse 94   Temp (!) 97.2 F (36.2 C) (Oral)   Resp 20   Ht '5\' 5"'$  (1.651 m)   Wt 83 kg   SpO2 93%   BMI 30.45 kg/m   . General: Elderly female. Awake and alert.  Patient quite confused. No acute cardiopulmonary distress.  Marland Kitchen HEENT: Normocephalic atraumatic.  Right and left ears normal in appearance.  Pupils equal, round, reactive to light. Extraocular muscles are intact. Sclerae anicteric and noninjected.  Moist mucosal membranes. No mucosal lesions.  . Neck: Neck supple without lymphadenopathy. No carotid bruits. No masses palpated.  . Cardiovascular: Regular rate with normal S1-S2 sounds. No murmurs, rubs, gallops auscultated. No JVD.  Marland Kitchen Respiratory: Good respiratory effort with no wheezes, rales, rhonchi. Lungs clear to auscultation bilaterally.  No accessory muscle use. . Abdomen: Soft, nontender, nondistended. Active bowel sounds. No masses or hepatosplenomegaly  . Skin: No rashes, lesions, or ulcerations.  Dry, warm to touch. 2+ dorsalis pedis and radial pulses. . Musculoskeletal: No calf or leg pain. All major joints not erythematous nontender.  No upper or lower joint deformation.  Good ROM.  No contractures  . Psychiatric: Patient confused . Neurologic: No focal neurological deficits.  Unable to fully assess since patient unable to perform exam           Labs on Admission: I have personally reviewed following labs and imaging studies  CBC: Recent Labs  Lab 01/24/21 1616  WBC 15.7*  NEUTROABS 13.2*  HGB 10.1*  HCT 28.2*  MCV 87.0  PLT 123XX123   Basic Metabolic Panel: Recent Labs  Lab 01/24/21 1616  NA 131*  K 3.5  CL 100  CO2 16*   GLUCOSE 132*  BUN 59*  CREATININE 4.14*  CALCIUM 8.8*   GFR: Estimated Creatinine Clearance: 14.2 mL/min (A) (by C-G formula based on SCr of 4.14 mg/dL (H)). Liver Function Tests: Recent Labs  Lab 01/24/21 1616  AST 284*  ALT 108*  ALKPHOS 172*  BILITOT 9.1*  PROT 6.6  ALBUMIN 2.8*   No results for input(s): LIPASE, AMYLASE in the last 168 hours. Recent Labs  Lab 01/24/21 1616  AMMONIA 74*   Coagulation Profile: Recent Labs  Lab 01/23/21 0000 01/24/21 1616  INR 2.0* 2.2*   Cardiac Enzymes: No results for input(s): CKTOTAL, CKMB, CKMBINDEX, TROPONINI in the last 168 hours. BNP (last 3 results) No results for input(s): PROBNP in the last 8760 hours. HbA1C: No results for input(s): HGBA1C in the last 72 hours. CBG: Recent Labs  Lab 01/24/21 1732  GLUCAP 119*   Lipid Profile: No results for input(s): CHOL, HDL, LDLCALC, TRIG, CHOLHDL, LDLDIRECT in the last 72 hours. Thyroid Function Tests: No results for input(s): TSH, T4TOTAL, FREET4, T3FREE, THYROIDAB in the last 72 hours. Anemia Panel: No results for input(s): VITAMINB12, FOLATE, FERRITIN, TIBC, IRON, RETICCTPCT in the last 72 hours. Urine analysis:    Component Value Date/Time   COLORURINE AMBER (A) 12/22/2020 1313   APPEARANCEUR HAZY (A) 12/22/2020 1313   APPEARANCEUR Clear 03/23/2017 1006   LABSPEC 1.016 12/22/2020 1313   PHURINE 5.0 12/22/2020 1313   GLUCOSEU NEGATIVE 12/22/2020 1313   HGBUR NEGATIVE 12/22/2020 1313   BILIRUBINUR NEGATIVE 12/22/2020 1313   BILIRUBINUR Negative 03/23/2017 [place] 12/22/2020 1313   PROTEINUR NEGATIVE 12/22/2020 1313   NITRITE NEGATIVE 12/22/2020 1313   LEUKOCYTESUR MODERATE (A) 12/22/2020 1313   Sepsis Labs: '@LABRCNTIP'$ (procalcitonin:4,lacticidven:4) ) Recent Results (from the past 240 hour(s))  Resp Panel by RT-PCR (Flu A&B, Covid) Nasopharyngeal Swab     Status: None   Collection Time: 01/24/21  5:30 PM   Specimen: Nasopharyngeal Swab;  Nasopharyngeal(NP) swabs in vial transport medium  Result Value Ref Range Status   SARS Coronavirus 2 by RT PCR NEGATIVE NEGATIVE Final    Comment: (NOTE) SARS-CoV-2 target nucleic acids are NOT DETECTED.  The SARS-CoV-2 RNA is generally detectable in upper respiratory specimens during the acute phase of infection. The lowest concentration of SARS-CoV-2 viral copies this assay can detect is 138 copies/mL. A negative result does not preclude SARS-Cov-2 infection and should not be used as the sole basis for treatment or other patient management decisions. A negative result may occur with  improper specimen collection/handling, submission of specimen other than nasopharyngeal swab, presence of viral mutation(s) within the areas targeted by this assay, and inadequate number of viral copies(<138 copies/mL). A negative result must be combined with clinical observations, patient history, and epidemiological information. The expected result is Negative.  Fact Sheet for Patients:  EntrepreneurPulse.com.au  Fact Sheet for Healthcare Providers:  IncredibleEmployment.be  This test is no t yet approved or cleared by the [place] FDA and  has been authorized for detection and/or diagnosis of SARS-CoV-2 by FDA under an Emergency Use Authorization (EUA). This EUA will remain  in effect (meaning this test can be used) for the duration of the COVID-19 declaration under Section 564(b)(1) of the Act, 21 U.S.C.section 360bbb-3(b)(1), unless the authorization is terminated  or revoked sooner.       Influenza A by PCR NEGATIVE NEGATIVE Final   Influenza B by PCR NEGATIVE NEGATIVE Final    Comment: (NOTE) The Xpert Xpress SARS-CoV-2/FLU/RSV plus assay is intended as an aid in the diagnosis of influenza from Nasopharyngeal swab specimens and should not be used as a sole basis for treatment. Nasal washings and aspirates are unacceptable for Xpert Xpress  SARS-CoV-2/FLU/RSV testing.  Fact Sheet for Patients: EntrepreneurPulse.com.au  Fact Sheet for Healthcare Providers: IncredibleEmployment.be  This test is not yet approved or cleared by the [place] FDA and has been authorized for detection and/or diagnosis of SARS-CoV-2 by FDA under an Emergency Use Authorization (  EUA). This EUA will remain in effect (meaning this test can be used) for the duration of the COVID-19 declaration under Section 564(b)(1) of the Act, 21 U.S.C. section 360bbb-3(b)(1), unless the authorization is terminated or revoked.  Performed at [facility], [address] [place], [place] [address]      Radiological Exams on Admission: CT Head Wo Contrast  Result Date: 01/24/2021 CLINICAL DATA:  Status post fall. EXAM: CT HEAD WITHOUT CONTRAST TECHNIQUE: Contiguous axial images were obtained from the base of the skull through the vertex without intravenous contrast. COMPARISON:  None. FINDINGS: Brain: There is very mild cerebral atrophy with widening of the extra-axial spaces and ventricular dilatation. There are areas of very mildly decreased attenuation within the white matter tracts of the supratentorial brain, consistent with early microvascular disease changes. A small chronic right basal ganglia lacunar infarct is seen. Vascular: No hyperdense vessel or unexpected calcification. Skull: Normal. Negative for fracture or focal lesion. Sinuses/Orbits: No acute finding. Other: None. IMPRESSION: 1. Very mild cerebral atrophy and early microvascular disease changes of the supratentorial brain. 2. Small chronic right basal ganglia lacunar infarct. 3. No acute intracranial abnormality. Electronically Signed   By: Virgina Norfolk M.D.   On: 01/24/2021 16:09   DG Chest Portable 1 View  Result Date: 01/24/2021 CLINICAL DATA:  Status post fall. EXAM: PORTABLE CHEST 1 VIEW COMPARISON:  January 14, 2021 FINDINGS: Decreased lung volumes are  seen which is likely secondary to the degree of patient inspiration. Chronic appearing increased interstitial lung markings are seen. Ill-defined, patchy areas of atelectasis and/or infiltrate are seen within the lateral aspect of the right lung base and bilateral apices. This represents a new finding when compared to the prior study. There is stable elevation of the right hemidiaphragm. There is no evidence of a pleural effusion or pneumothorax. Moderate to marked severity cardiomegaly is noted. Marked severity calcification of the aortic arch is seen. The visualized skeletal structures are unremarkable. IMPRESSION: Stable cardiomegaly with interval development of mild biapical and right basilar atelectasis and/or infiltrate since the prior study. Electronically Signed   By: Virgina Norfolk M.D.   On: 01/24/2021 16:25    EKG: Independently reviewed.  Sinus rhythm with borderline right axis deviation.  Q waves in inferior lead suggestive of inferior infarct age indeterminate.  No acute ST changes.  Borderline QT prolongation  Assessment/Plan: Principal Problem:   Acute hepatic encephalopathy Active Problems:   GERD (gastroesophageal reflux disease)   Generalized anxiety disorder   Hypoalbuminemia   Moderate malnutrition (HCC)   Portal hypertension (HCC)   Cirrhosis of liver with ascites ([place])   Acute renal failure (ARF) ([place])    This patient was discussed with the ED physician, including pertinent vitals, physical exam findings, labs, and imaging.  We also discussed care given by the ED provider.  1. Acute hepatic encephalopathy secondary to cirrhosis of liver secondary to [place] a. Patient not tolerating oral lactulose b. We will give lactulose enema and premedicate with anxiolytics in order to help calm patient c. Recheck ammonia level tomorrow morning d. As patient clears, should be able to transition to oral medications e. Consult GI f. Continue rifaximin 2. Acute renal failure with  probable hepatorenal syndrome. a. Discussed patient with Dr. Joylene Grapes of nephrology b. He recommended trial of albumin 5% 50 g tonight c. Recheck creatinine in the morning d. He would be available tomorrow morning for consultation if patient continues to worsen e. Patient also did get IV fluids in the ED f.  Need to be cautious given history of diastolic heart failure 3. Hypoalbuminemia  4. Moderate malnutrition a. Progress diet as tolerated 5. GERD a. Continue home medications 6. General anxiety disorder a. Continue home medications  DVT prophylaxis: SCDs given INR 2.2 Consultants: GI Code Status: Full code Family Communication: Husband Disposition Plan: Pending   Truett Mainland, DO

## 2021-01-24 NOTE — ED Triage Notes (Signed)
Pt arrived by [facility] for fall today, hitting her head. Pt is on blood thinners. Pt has ams from increased ammonia levels and liver failure.

## 2021-01-24 NOTE — ED Notes (Signed)
Attempted to give po lactulose but pt spit out immediately  Attempted to redirect without success  md notified

## 2021-01-24 NOTE — ED Provider Notes (Signed)
[facility] Provider Note   CSN: [id number] Arrival date & time: 01/24/21  1429     History Chief Complaint  Patient presents with  . Fall    Lori Cooper is a 65 y.o. female.  Pt presents to the ED today with a fall.  Pt has a hx of NASH and has a hx of elevated ammonia.  She is unable to give me any history.  Per EMS, she did fall.  They said she is on blood thinners, but I don't seem them on her chart.  It looks like pt was on blood thinners, but Eliquis were d/c'd in after her last hospital stay (admitted from 1/24-2/25)  Pt denies any pain.  Pt's sister is here now.  She is a very helpful historian.  She said pt has not been her normal self for several days.  She does not think she's eaten in a week.  Very little to drink.  She does not think pt has been taking her meds.  She thinks she's been spitting it out.  Pt's daughter contacted by phone and said she has been taking her meds.  So, it is unclear if she's been taking them or not.  Pt's sister said pt is much more confused than usual.  She said pt slid out of her chair and hit her head on the bed rail.  ? Loc.          Past Medical History:  Diagnosis Date  . Anxiety   . Chronic back pain   . Closed fracture of left olecranon process 08/11/2020  . DDD (degenerative disc disease), cervical   . DDD (degenerative disc disease), lumbosacral   . Depression   . GERD (gastroesophageal reflux disease)   . Hiatal hernia   . History of adenomatous polyp of colon    2009  tubular adenoma  . History of esophagitis   . History of idiopathic seizure    1984  x1 after vaginal delivery (per pt negative work-up and no issue since)  . History of left shoulder fracture    01/ 2014  proximal humerus fx  . Hyperlipidemia   . Left ulnar fracture 08/11/2020  . OA (osteoarthritis)    knees and thumbs  . RLS (restless legs syndrome)   . Supraumbilical hernia   . Umbilical hernia   . Wears dentures    lower     Patient Active Problem List   Diagnosis Date Noted  . Acute hepatic encephalopathy 01/24/2021  . Acute renal failure (ARF) ([place]) 01/24/2021  . Encephalopathy, hepatic ([place]) 01/21/2021  . Cholestasis 01/21/2021  . Cirrhosis of liver with ascites ([place])   . Portal hypertension ([place])   . Elevated LFTs   . Other ascites   . 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 Transaminitis 12/18/2020  . Hypotension 12/18/2020  . Hypoalbuminemia 12/18/2020  . Delayed union of rib fracture, left 8th rib 12/18/2020  . Open abdominal wall wound s/p ileocecectomy 12/18/2020  . Moderate malnutrition ([place]) 12/18/2020  . Esophageal dysphagia 12/18/2020  . Prolonged QT interval 12/10/2020  . Diaphragmatic hernia 12/10/2020  . Aortic atherosclerosis ([place]) 12/10/2020  . Pleural effusion on right 12/09/2020  . Class 1 obesity 12/09/2020  . Left ulnar fracture 08/11/2020  . Closed fracture of left olecranon process 08/11/2020  . Traumatic injury of small intestine 08/08/2020  . History of pulmonary embolus (PE) 08/12/2018  . Controlled substance agreement signed 08/12/2018  . Benzodiazepine dependence ([place]) 08/12/2018  .  Positive colorectal cancer screening using Cologuard test 07/13/2018  . DOE (dyspnea on exertion) 03/10/2018  . Upper airway cough syndrome 03/10/2018  . Metabolic syndrome [id number]  . Arthritis 07/16/2015  . Depression 12/21/2014  . Supraumbilical hernia  [id number]  . Incisional hernias x 3 s/p laparoscopic repair with mesh 12/31/2016 04/02/2014  . Personal history of colonic polyps 04/02/2014  . Morbid obesity due to excess calories ([place]) complicated by hyperlipidemia/ preDM 02/13/2014  . Prediabetes 09/21/2013  . Vitamin D deficiency 06/23/2013  . HLD (hyperlipidemia) 02/20/2013  . GERD (gastroesophageal reflux disease) 02/20/2013  . Generalized anxiety disorder 02/20/2013  . Restless legs 02/20/2013    Past Surgical History:  Procedure Laterality Date  .  APPLICATION OF WOUND VAC N/A 08/08/2020   Procedure: APPLICATION OF WOUND VAC;  Surgeon: Kinsinger, Arta Bruce, MD;  Location: [facility];  Service: General;  Laterality: N/A;  . BIOPSY N/A 12/21/2020   Procedure: BIOPSY;  Surgeon: Irving Copas., MD;  Location: [facility];  Service: Gastroenterology;  Laterality: N/A;  . CARDIOVASCULAR STRESS TEST  05/06/2010   Lexiscan nuclear study w/ no exercise/  probably normal , per images there is a large reversible defect in the mid to distal anterior wall, this seems to be shifting breast attenuation/  normal LV function and wall motion , ef 67%  . CARPAL TUNNEL RELEASE Bilateral 2011   excision ganglion cyst left wrist  . CATARACT EXTRACTION W/ INTRAOCULAR LENS  IMPLANT, BILATERAL  09 and 10/ 2017  . CHEST TUBE INSERTION Right 12/20/2020   Procedure: INSERTION PLEURAL DRAINAGE CATHETER;  Surgeon: [name], MD;  Location: [facility];  Service: Thoracic;  Laterality: Right;  . COLONOSCOPY  05/08/2008  . COLONOSCOPY N/A 09/15/2018   Procedure: COLONOSCOPY;  Surgeon: Rogene Houston, MD;  [facility];  Service: Endoscopy;  Laterality: N/A;  12:00  . ESOPHAGOGASTRODUODENOSCOPY (EGD) WITH PROPOFOL N/A 12/21/2020   Procedure: ESOPHAGOGASTRODUODENOSCOPY (EGD) WITH PROPOFOL;  Surgeon: Rush Landmark Telford Nab., MD;  Location: [facility];  Service: Gastroenterology;  Laterality: N/A;  . EUS N/A 12/21/2020   Procedure: UPPER ENDOSCOPIC ULTRASOUND (EUS) LINEAR;  Surgeon: Irving Copas., MD;  Location: [facility];  Service: Gastroenterology;  Laterality: N/A;  . IR PERC PLEURAL DRAIN W/INDWELL CATH W/IMG GUIDE  12/13/2020  . IR TRANSCATHETER BX  12/23/2020  . IR US GUIDE VASC ACCESS RIGHT  12/23/2020  . IR VENOGRAM HEPATIC W HEMODYNAMIC EVALUATION  12/23/2020  . LAPAROSCOPIC CHOLECYSTECTOMY  2012  . LAPAROTOMY N/A 08/08/2020   Procedure: EXPLORATORY LAPAROTOMY, ILEOCECECTOMY WITH ANASTOMOSIS, MECKELS RESSECTION;  Surgeon: Kinsinger, Arta Bruce, MD;  Location: [facility];  Service: General;  Laterality: N/A;  . ORIF ELBOW FRACTURE Left 08/10/2020   Procedure: OPEN REDUCTION INTERNAL FIXATION (ORIF) OLECRANON and ULNA FRACTURE;  Surgeon: Altamese Aleut[place], MD;  Location: [facility];  Service: Orthopedics;  Laterality: Left;  . SHOULDER ARTHROSCOPY/  ACROMIOPLASTY/  DISTAL CLAVICAL RESECTION/  DEBRIDEMENT LABRAL TEAR Left 02/09/2005  . TONSILLECTOMY AND ADENOIDECTOMY  age 67  . TUBAL LIGATION Bilateral yrs ago  . VENTRAL HERNIA REPAIR N/A 12/31/2016   Procedure: LAPAROSCOPIC VENTRAL WALL HERNIA REPAIR WITH ERAS PATHWAY;  Surgeon: Michael Boston, MD;  Location: [facility];  Service: General;  Laterality: N/A;     OB History   No obstetric history on file.     Family History  Problem Relation Age of Onset  . Hypertension Mother   . Kidney disease Mother   . Diabetes Mother   . Pulmonary  embolism Father 65  . Obesity Sister     Social History   Tobacco Use  . Smoking status: Former Smoker    Packs/day: 0.50    Years: 40.00    Pack years: 20.00    Types: Cigarettes    Start date: 02/20/1973    Quit date: 06/16/2013    Years since quitting: 7.6  . Smokeless tobacco: Never Used  Vaping Use  . Vaping Use: Former  Substance Use Topics  . Alcohol use: No  . Drug use: No    Home Medications Prior to Admission medications   Medication Sig Start Date End Date Taking? Authorizing Provider  albuterol (PROVENTIL HFA;VENTOLIN HFA) 108 (90 Base) MCG/ACT inhaler Inhale 2 puffs into the lungs every 6 (six) hours as needed for wheezing or shortness of breath.   Yes [provider]  budesonide-formoterol (SYMBICORT) 80-4.5 MCG/ACT inhaler Inhale 2 puffs by mouth twice daily Patient taking differently: Inhale 2 puffs into the lungs 2 (two) times daily. 08/01/19  Yes Hawks, Christy A, FNP  buPROPion (WELLBUTRIN XL) 150 MG 24 hr tablet Take 1 tablet by mouth once daily Patient taking differently: Take 150 mg by mouth every  morning. 07/08/20  Yes Hawks, Christy A, FNP  busPIRone (BUSPAR) 5 MG tablet Take 1 tablet by mouth three times daily as needed 01/13/21  Yes Hawks, Christy A, FNP  Cholecalciferol (VITAMIN D) 125 MCG (5000 UT) CAPS Take 1 capsule by mouth daily. 08/23/20  Yes Ainsley Spinner, PA-C  clotrimazole (MYCELEX) 10 MG troche Take 1 tablet (10 mg total) by mouth 5 (five) times daily. 01/14/21  Yes Hawks, Christy A, FNP  FLUoxetine (PROZAC) 40 MG capsule Take 2 capsules (80 mg total) by mouth daily. Patient taking differently: Take 40 mg by mouth daily. 12/05/20  Yes Hawks, Christy A, FNP  lactulose (CHRONULAC) 10 GM/15ML solution Take 20 g (30 mL) 2-3 times a day for goal of 2-3 bowel movements a day 01/10/21  Yes Mercy Riding, MD  meclizine (ANTIVERT) 25 MG tablet Take 25 mg by mouth 3 (three) times daily as needed for dizziness.   Yes [provider]  Menthol, Topical Analgesic, (BIOFREEZE ROLL-ON EX) Apply 1 application topically daily as needed (pain).   Yes [provider]  midodrine (PROAMATINE) 5 MG tablet Take 3 tablets (15 mg total) by mouth 3 (three) times daily with meals. 01/14/21  Yes Hawks, Christy A, FNP  Multiple Vitamin (MULTIVITAMIN WITH MINERALS) TABS tablet Take 1 tablet by mouth every morning.   Yes [provider]  ondansetron (ZOFRAN) 4 MG tablet Take 1 tablet (4 mg total) by mouth every 8 (eight) hours as needed for nausea or vomiting. 01/17/21  Yes [name], PA  pantoprazole (PROTONIX) 20 MG tablet Take 1 tablet by mouth once daily Patient taking differently: Take 20 mg by mouth every morning. 07/08/20  Yes Hawks, Christy A, FNP  rifaximin (XIFAXAN) 550 MG TABS tablet Take 1 tablet (550 mg total) by mouth 2 (two) times daily. 01/14/21  Yes Hawks, Christy A, FNP  sodium bicarbonate 650 MG tablet Take 1 tablet (650 mg total) by mouth 3 (three) times daily. 01/10/21  Yes Mercy Riding, MD  diclofenac Sodium (VOLTAREN) 1 % GEL Apply 1 application topically 2 (two)  times daily as needed (pain). Patient not taking: Reported on 01/24/2021    [provider]  famotidine (PEPCID) 20 MG tablet Take 1 tablet (20 mg total) by mouth at bedtime. Patient not taking: Reported on  01/24/2021 03/01/20   Sharion Balloon, FNP    Allergies    Patient has no known allergies.  Review of Systems   Review of Systems  Unable to perform ROS: Mental status change  All other systems reviewed and are negative.   Physical Exam Updated Vital Signs BP (!) 107/52 (BP Location: Right Arm)   Pulse 90   Temp (!) 97.1 F (36.2 C)   Resp 17   Ht '5\' 5"'$  (1.651 m)   Wt 79.1 kg   SpO2 98%   BMI 29.02 kg/m   Physical Exam Vitals and nursing note reviewed.  Constitutional:      Appearance: She is ill-appearing.  HENT:     Head: Normocephalic and atraumatic.     Right Ear: External ear normal.     Left Ear: External ear normal.     Nose: Nose normal.     Mouth/Throat:     Mouth: Mucous membranes are dry.  Eyes:     Extraocular Movements: Extraocular movements intact.     Conjunctiva/sclera: Conjunctivae normal.     Pupils: Pupils are equal, round, and reactive to light.  Cardiovascular:     Rate and Rhythm: Normal rate and regular rhythm.     Pulses: Normal pulses.     Heart sounds: Normal heart sounds.  Pulmonary:     Effort: Pulmonary effort is normal.     Breath sounds: Normal breath sounds.  Abdominal:     General: Abdomen is flat. Bowel sounds are normal.     Palpations: Abdomen is soft.  Musculoskeletal:        General: Normal range of motion.     Cervical back: Normal range of motion and neck supple.  Skin:    Capillary Refill: Capillary refill takes less than 2 seconds.     Coloration: Skin is jaundiced.  Neurological:     Mental Status: She is lethargic and disoriented.     Motor: Tremor present.  Psychiatric:     Comments: Unable to assess     ED Results / Procedures / Treatments   Labs (all labs ordered are listed, but only abnormal  results are displayed) Labs Reviewed  CBC WITH DIFFERENTIAL/PLATELET - Abnormal; Notable for the following components:      Result Value   WBC 15.7 (*)    RBC 3.24 (*)    Hemoglobin 10.1 (*)    HCT 28.2 (*)    RDW 29.2 (*)    Neutro Abs 13.2 (*)    Monocytes Absolute 1.1 (*)    All other components within normal limits  COMPREHENSIVE METABOLIC PANEL - Abnormal; Notable for the following components:   Sodium 131 (*)    CO2 16 (*)    Glucose, Bld 132 (*)    BUN 59 (*)    Creatinine, Ser 4.14 (*)    Calcium 8.8 (*)    Albumin 2.8 (*)    AST 284 (*)    ALT 108 (*)    Alkaline Phosphatase 172 (*)    Total Bilirubin 9.1 (*)    GFR, Estimated 11 (*)    All other components within normal limits  AMMONIA - Abnormal; Notable for the following components:   Ammonia 74 (*)    All other components within normal limits  PROTIME-INR - Abnormal; Notable for the following components:   Prothrombin Time 23.7 (*)    INR 2.2 (*)    All other components within normal limits  CBG MONITORING, ED - Abnormal; Notable  for the following components:   Glucose-Capillary 119 (*)    All other components within normal limits  RESP PANEL BY RT-PCR (FLU A&B, COVID) [id number]  URINALYSIS, ROUTINE W REFLEX MICROSCOPIC  CBC  COMPREHENSIVE METABOLIC PANEL  PROTIME-INR  AMMONIA    EKG EKG Interpretation  Date/Time:  Friday January 24 2021 16:00:48 EST Ventricular Rate:  91 PR Interval:    QRS Duration: 100 QT Interval:  406 QTC Calculation: 500 R Axis:   86 Text Interpretation: Sinus rhythm Borderline right axis deviation Low voltage, precordial leads Abnormal inferior Q waves Nonspecific T abnrm, anterolateral leads Borderline prolonged QT interval No significant change since last tracing Confirmed by Isla Pence [phone]) on 01/24/2021 4:02:50 PM   Radiology CT Head Wo Contrast  Result Date: 01/24/2021 CLINICAL DATA:  Status post fall. EXAM: CT HEAD WITHOUT CONTRAST TECHNIQUE: Contiguous axial  images were obtained from the base of the skull through the vertex without intravenous contrast. COMPARISON:  None. FINDINGS: Brain: There is very mild cerebral atrophy with widening of the extra-axial spaces and ventricular dilatation. There are areas of very mildly decreased attenuation within the white matter tracts of the supratentorial brain, consistent with early microvascular disease changes. A small chronic right basal ganglia lacunar infarct is seen. Vascular: No hyperdense vessel or unexpected calcification. Skull: Normal. Negative for fracture or focal lesion. Sinuses/Orbits: No acute finding. Other: None. IMPRESSION: 1. Very mild cerebral atrophy and early microvascular disease changes of the supratentorial brain. 2. Small chronic right basal ganglia lacunar infarct. 3. No acute intracranial abnormality. Electronically Signed   By: Virgina Norfolk M.D.   On: 01/24/2021 16:09   DG Chest Portable 1 View  Result Date: 01/24/2021 CLINICAL DATA:  Status post fall. EXAM: PORTABLE CHEST 1 VIEW COMPARISON:  January 14, 2021 FINDINGS: Decreased lung volumes are seen which is likely secondary to the degree of patient inspiration. Chronic appearing increased interstitial lung markings are seen. Ill-defined, patchy areas of atelectasis and/or infiltrate are seen within the lateral aspect of the right lung base and bilateral apices. This represents a new finding when compared to the prior study. There is stable elevation of the right hemidiaphragm. There is no evidence of a pleural effusion or pneumothorax. Moderate to marked severity cardiomegaly is noted. Marked severity calcification of the aortic arch is seen. The visualized skeletal structures are unremarkable. IMPRESSION: Stable cardiomegaly with interval development of mild biapical and right basilar atelectasis and/or infiltrate since the prior study. Electronically Signed   By: Virgina Norfolk M.D.   On: 01/24/2021 16:25    Procedures .Central  Line  Date/Time: 01/24/2021 5:26 PM Performed by: Isla Pence, MD Authorized by: Isla Pence, MD   Consent:    Consent obtained:  Verbal   Consent given by:  Healthcare agent   Risks, benefits, and alternatives were discussed: yes     Alternatives discussed:  No treatment Universal protocol:    Procedure explained and questions answered to patient or proxy's satisfaction: yes     Patient identity confirmed:  Arm band Pre-procedure details:    Indication(s): insufficient peripheral access     Hand hygiene: Hand hygiene performed prior to insertion     Sterile barrier technique: All elements of maximal sterile technique followed     Skin preparation:  Povidone-iodine   Skin preparation agent: Skin preparation agent completely dried prior to procedure   Sedation:    Sedation type:  None Anesthesia:    Anesthesia method:  Local infiltration   Local anesthetic:  Lidocaine  1% w/o epi Procedure details:    Location:  R femoral   Site selection rationale:  Elevated INR.  I needed a compressible site.   Procedural supplies:  Triple lumen   Catheter size:  7 Fr   Landmarks identified: yes     Ultrasound guidance: no     Number of attempts:  2   Successful placement: yes   Post-procedure details:    Post-procedure:  Dressing applied and line sutured   Assessment:  Blood return through all ports and free fluid flow   Procedure completion:  Tolerated     Medications Ordered in ED Medications  lactulose (CHRONULAC) 10 GM/15ML solution 20 g (20 g Oral Not Given 01/24/21 1846)  albumin human 5 % solution 50 g (has no administration in time range)  rifaximin (XIFAXAN) tablet 550 mg (has no administration in time range)  midodrine (PROAMATINE) tablet 15 mg (has no administration in time range)  buPROPion (WELLBUTRIN XL) 24 hr tablet 150 mg (has no administration in time range)  busPIRone (BUSPAR) tablet 5 mg (has no administration in time range)  FLUoxetine (PROZAC) capsule 40 mg  (has no administration in time range)  sodium bicarbonate tablet 650 mg (has no administration in time range)  fluticasone furoate-vilanterol (BREO ELLIPTA) 100-25 MCG/INH 1 puff (has no administration in time range)  albuterol (VENTOLIN HFA) 108 (90 Base) MCG/ACT inhaler 2 puff (has no administration in time range)  LORazepam (ATIVAN) injection 0.5 mg (has no administration in time range)  folic acid injection 1 mg (has no administration in time range)  thiamine (B-1) injection 100 mg (has no administration in time range)  lactulose (CHRONULAC) enema 200 gm (has no administration in time range)  feeding supplement (ENSURE ENLIVE / ENSURE PLUS) liquid 237 mL (has no administration in time range)  0.9 %  sodium chloride infusion ( Intravenous Stopped 01/24/21 2015)  sodium chloride 0.9 % bolus 1,000 mL (1,000 mLs Intravenous New Bag/Given 01/24/21 1734)  LORazepam (ATIVAN) injection 0.5 mg (0.5 mg Intravenous Given 01/24/21 2004)    ED Course  I have reviewed the triage vital signs and the nursing notes.  Pertinent labs & imaging results that were available during my care of the patient were reviewed by me and considered in my medical decision making (see chart for details).    MDM Rules/Calculators/A&P                           Pt's nurse unable to obtain IV even with [place].  Pt is in acute renal failure and needs IVFs.  So, I spoke with her sister and her daughter who consented for treatment.  Pt's ammonia is elevated, so she was given lactulose.  LFTs continue to be elevated.    BP is stable.  Pt d/w Dr. Nehemiah Settle (triad) for admission.  CRITICAL CARE Performed by: Isla Pence   Total critical care time: 45 minutes  Critical care time was exclusive of separately billable procedures and treating other patients.  Critical care was necessary to treat or prevent imminent or life-threatening deterioration.  Critical care was time spent personally by me on the following activities:  development of treatment plan with patient and/or surrogate as well as nursing, discussions with consultants, evaluation of patient's response to treatment, examination of patient, obtaining history from patient or surrogate, ordering and performing treatments and interventions, ordering and review of laboratory studies, ordering and review of radiographic studies, pulse oximetry and re-evaluation of patient's  condition.  Final Clinical Impression(s) / ED Diagnoses Final diagnoses:  Hepatic encephalopathy ([place])  AKI (acute kidney injury) ([place])  NASH (nonalcoholic steatohepatitis)  Chronic liver failure without hepatic coma ([facility])  Elevated INR    Rx / DC Orders ED Discharge Orders    None       Isla Pence, MD 01/24/21 2325

## 2021-01-24 NOTE — ED Notes (Signed)
Attempted IV x2  MD aware

## 2021-01-25 ENCOUNTER — Inpatient Hospital Stay (HOSPITAL_COMMUNITY): Payer: PPO

## 2021-01-25 DIAGNOSIS — K7211 Chronic hepatic failure with coma: Secondary | ICD-10-CM

## 2021-01-25 DIAGNOSIS — N189 Chronic kidney disease, unspecified: Secondary | ICD-10-CM | POA: Diagnosis not present

## 2021-01-25 DIAGNOSIS — R7303 Prediabetes: Secondary | ICD-10-CM | POA: Diagnosis not present

## 2021-01-25 DIAGNOSIS — R4182 Altered mental status, unspecified: Secondary | ICD-10-CM | POA: Diagnosis not present

## 2021-01-25 DIAGNOSIS — J80 Acute respiratory distress syndrome: Secondary | ICD-10-CM | POA: Diagnosis not present

## 2021-01-25 DIAGNOSIS — K767 Hepatorenal syndrome: Secondary | ICD-10-CM | POA: Diagnosis not present

## 2021-01-25 DIAGNOSIS — I272 Pulmonary hypertension, unspecified: Secondary | ICD-10-CM | POA: Diagnosis not present

## 2021-01-25 DIAGNOSIS — K7460 Unspecified cirrhosis of liver: Secondary | ICD-10-CM | POA: Diagnosis not present

## 2021-01-25 DIAGNOSIS — Z515 Encounter for palliative care: Secondary | ICD-10-CM | POA: Diagnosis not present

## 2021-01-25 DIAGNOSIS — K7200 Acute and subacute hepatic failure without coma: Principal | ICD-10-CM

## 2021-01-25 DIAGNOSIS — N170 Acute kidney failure with tubular necrosis: Secondary | ICD-10-CM | POA: Diagnosis not present

## 2021-01-25 DIAGNOSIS — K7581 Nonalcoholic steatohepatitis (NASH): Secondary | ICD-10-CM

## 2021-01-25 DIAGNOSIS — R6520 Severe sepsis without septic shock: Secondary | ICD-10-CM | POA: Diagnosis not present

## 2021-01-25 DIAGNOSIS — I517 Cardiomegaly: Secondary | ICD-10-CM | POA: Diagnosis not present

## 2021-01-25 DIAGNOSIS — K7290 Hepatic failure, unspecified without coma: Secondary | ICD-10-CM

## 2021-01-25 DIAGNOSIS — J90 Pleural effusion, not elsewhere classified: Secondary | ICD-10-CM | POA: Diagnosis not present

## 2021-01-25 DIAGNOSIS — K7469 Other cirrhosis of liver: Secondary | ICD-10-CM | POA: Diagnosis not present

## 2021-01-25 DIAGNOSIS — R188 Other ascites: Secondary | ICD-10-CM | POA: Diagnosis not present

## 2021-01-25 DIAGNOSIS — E872 Acidosis: Secondary | ICD-10-CM | POA: Diagnosis not present

## 2021-01-25 DIAGNOSIS — K766 Portal hypertension: Secondary | ICD-10-CM | POA: Diagnosis not present

## 2021-01-25 DIAGNOSIS — I361 Nonrheumatic tricuspid (valve) insufficiency: Secondary | ICD-10-CM | POA: Diagnosis not present

## 2021-01-25 DIAGNOSIS — Z452 Encounter for adjustment and management of vascular access device: Secondary | ICD-10-CM | POA: Diagnosis not present

## 2021-01-25 DIAGNOSIS — G936 Cerebral edema: Secondary | ICD-10-CM | POA: Diagnosis not present

## 2021-01-25 DIAGNOSIS — J189 Pneumonia, unspecified organism: Secondary | ICD-10-CM | POA: Diagnosis not present

## 2021-01-25 DIAGNOSIS — K7210 Chronic hepatic failure without coma: Secondary | ICD-10-CM | POA: Diagnosis not present

## 2021-01-25 DIAGNOSIS — R578 Other shock: Secondary | ICD-10-CM | POA: Diagnosis not present

## 2021-01-25 DIAGNOSIS — J811 Chronic pulmonary edema: Secondary | ICD-10-CM | POA: Diagnosis not present

## 2021-01-25 DIAGNOSIS — R579 Shock, unspecified: Secondary | ICD-10-CM | POA: Diagnosis not present

## 2021-01-25 DIAGNOSIS — Z66 Do not resuscitate: Secondary | ICD-10-CM | POA: Diagnosis not present

## 2021-01-25 DIAGNOSIS — A419 Sepsis, unspecified organism: Secondary | ICD-10-CM | POA: Diagnosis not present

## 2021-01-25 DIAGNOSIS — N179 Acute kidney failure, unspecified: Secondary | ICD-10-CM | POA: Diagnosis not present

## 2021-01-25 DIAGNOSIS — N1832 Chronic kidney disease, stage 3b: Secondary | ICD-10-CM

## 2021-01-25 DIAGNOSIS — R791 Abnormal coagulation profile: Secondary | ICD-10-CM

## 2021-01-25 DIAGNOSIS — K7201 Acute and subacute hepatic failure with coma: Secondary | ICD-10-CM | POA: Diagnosis not present

## 2021-01-25 DIAGNOSIS — D649 Anemia, unspecified: Secondary | ICD-10-CM | POA: Diagnosis not present

## 2021-01-25 DIAGNOSIS — E785 Hyperlipidemia, unspecified: Secondary | ICD-10-CM | POA: Diagnosis not present

## 2021-01-25 LAB — URINALYSIS, COMPLETE (UACMP) WITH MICROSCOPIC
Bacteria, UA: NONE SEEN
Glucose, UA: NEGATIVE mg/dL
Hgb urine dipstick: NEGATIVE
Ketones, ur: 5 mg/dL — AB
Nitrite: NEGATIVE
Protein, ur: 100 mg/dL — AB
Specific Gravity, Urine: 1.041 — ABNORMAL HIGH (ref 1.005–1.030)
WBC, UA: >50 WBC/hpf — ABNORMAL HIGH (ref 0–5)
pH: 5 (ref 5.0–8.0)

## 2021-01-25 LAB — PROTIME-INR
INR: 2.4 — ABNORMAL HIGH (ref 0.8–1.2)
Prothrombin Time: 25.3 seconds — ABNORMAL HIGH (ref 11.4–15.2)

## 2021-01-25 LAB — CBC
HCT: 26.3 % — ABNORMAL LOW (ref 36.0–46.0)
Hemoglobin: 9.3 g/dL — ABNORMAL LOW (ref 12.0–15.0)
MCH: 30.9 pg (ref 26.0–34.0)
MCHC: 35.4 g/dL (ref 30.0–36.0)
MCV: 87.4 fL (ref 80.0–100.0)
Platelets: 149 10*3/uL — ABNORMAL LOW (ref 150–400)
RBC: 3.01 MIL/uL — ABNORMAL LOW (ref 3.87–5.11)
RDW: 28.8 % — ABNORMAL HIGH (ref 11.5–15.5)
WBC: 15.2 10*3/uL — ABNORMAL HIGH (ref 4.0–10.5)
nRBC: 0 % (ref 0.0–0.2)

## 2021-01-25 LAB — COMPREHENSIVE METABOLIC PANEL
ALT: 95 U/L — ABNORMAL HIGH (ref 0–44)
AST: 267 U/L — ABNORMAL HIGH (ref 15–41)
Albumin: 2.7 g/dL — ABNORMAL LOW (ref 3.5–5.0)
Alkaline Phosphatase: 148 U/L — ABNORMAL HIGH (ref 38–126)
Anion gap: 13 (ref 5–15)
BUN: 59 mg/dL — ABNORMAL HIGH (ref 8–23)
CO2: 17 mmol/L — ABNORMAL LOW (ref 22–32)
Calcium: 8.9 mg/dL (ref 8.9–10.3)
Chloride: 106 mmol/L (ref 98–111)
Creatinine, Ser: 4.12 mg/dL — ABNORMAL HIGH (ref 0.44–1.00)
GFR, Estimated: 11 mL/min — ABNORMAL LOW (ref >60)
Glucose, Bld: 106 mg/dL — ABNORMAL HIGH (ref 70–99)
Potassium: 3.7 mmol/L (ref 3.5–5.1)
Sodium: 136 mmol/L (ref 135–145)
Total Bilirubin: 8.4 mg/dL — ABNORMAL HIGH (ref 0.3–1.2)
Total Protein: 6 g/dL — ABNORMAL LOW (ref 6.5–8.1)

## 2021-01-25 LAB — AMMONIA: Ammonia: 109 umol/L — ABNORMAL HIGH (ref 9–35)

## 2021-01-25 LAB — HEPATITIS PANEL, ACUTE
HCV Ab: NONREACTIVE
Hep A IgM: NONREACTIVE
Hep B C IgM: NONREACTIVE
Hepatitis B Surface Ag: NONREACTIVE

## 2021-01-25 LAB — SODIUM, URINE, RANDOM: Sodium, Ur: <10 mmol/L

## 2021-01-25 LAB — PROCALCITONIN: Procalcitonin: 0.41 ng/mL

## 2021-01-25 LAB — MRSA PCR SCREENING: MRSA by PCR: NEGATIVE

## 2021-01-25 MED ORDER — CHLORHEXIDINE GLUCONATE CLOTH 2 % EX PADS
6.0000 | MEDICATED_PAD | Freq: Every day | CUTANEOUS | Status: DC
  Administered 2021-01-25 (×2): 6 via TOPICAL

## 2021-01-25 MED ORDER — PIPERACILLIN-TAZOBACTAM 3.375 G IVPB
3.3750 g | Freq: Two times a day (BID) | INTRAVENOUS | Status: DC
  Administered 2021-01-25: 3.375 g via INTRAVENOUS
  Filled 2021-01-25: qty 50

## 2021-01-25 MED ORDER — LACTULOSE ENEMA
300.0000 mL | Freq: Three times a day (TID) | ORAL | Status: DC
  Administered 2021-01-25: 300 mL via RECTAL
  Filled 2021-01-25 (×6): qty 300

## 2021-01-25 MED ORDER — OCTREOTIDE ACETATE 50 MCG/ML IJ SOLN: 100.0000 ug | Freq: Three times a day (TID) | INTRAMUSCULAR | Status: AC

## 2021-01-25 MED ORDER — LACTULOSE ENEMA
300.0000 mL | Freq: Two times a day (BID) | ORAL | Status: DC
  Filled 2021-01-25 (×5): qty 300

## 2021-01-25 MED ORDER — OCTREOTIDE ACETATE 50 MCG/ML IJ SOLN
100.0000 ug | Freq: Three times a day (TID) | INTRAMUSCULAR | Status: DC
  Administered 2021-01-25: 100 ug via SUBCUTANEOUS
  Filled 2021-01-25 (×7): qty 2

## 2021-01-25 MED ORDER — ALBUMIN HUMAN 25 % IV SOLN
25.0000 g | Freq: Four times a day (QID) | INTRAVENOUS | Status: DC
  Administered 2021-01-25: 25 g via INTRAVENOUS
  Filled 2021-01-25: qty 100

## 2021-01-25 MED ORDER — PIPERACILLIN SOD-TAZOBACTAM SO 3.375 (3-0.375) G IV SOLR: 3.3750 g | Freq: Three times a day (TID) | INTRAVENOUS | Status: AC

## 2021-01-25 MED ORDER — SODIUM CHLORIDE 0.9 % IV SOLN: INTRAVENOUS | Status: DC

## 2021-01-25 NOTE — Consult Note (Addendum)
Maylon Peppers, M.D. Gastroenterology & Hepatology                                           Patient Name: Lori Cooper Account #: _0 @   MRN: [id number] Admission Date: 01/24/2021 Date of Evaluation:  01/25/2021 Time of Evaluation: 10:22 AM   Referring Physician: Roxan Hockey, MD  Chief Complaint: Altered mental status  HPI:  This is a 65 y.o. 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  The patient is currently obtunded and does not provide any medical history.  I tried to reach the daughter to discuss her symptoms but she did not pick up the phone, I left a detailed voice message to call back.  Medical history is obtained from the medical record. Patient was admitted yesterday after presenting worsening confusion for the last few days.  Patient was having poor intake of lactulose at home for the last few weeks and was not able to afford Xifaxan due to cost of medication.  Family considered that her mental status worsened for the last 2 days and was spitting her medication.  There has not been any evidence of melena, hematochezia, vomiting, abdominal pain or worsening abdominal distention.  In the ER, patient had an initial blood pressure of 99/77, with a heart rate of 92 she was afebrile with a temperature of 36.6 saturating 94% on nasal cannula.Initial labs upon admission showed marked normal labs characterized by CMP with total bilirubin of 9.1, AST 284, ALT 108, alkaline phosphatase 172, albumin 2.8, creatinine 4.14, BUN 59, sodium 131, leukocytosis of 15,700, normocytic anemia with hemoglobin of 10.1 and MCV 87, normal platelet 169 INR was 2.2.  Ammonia was 74.  The patient  was negative for COVID-19 infection.  Admission imaging showed a CT of the head without IV contrast that was negative for any acute intracranial abnormality but there was presence of mild cerebral atrophy and early microvascular disease.  Chest x-ray showed cardiomegaly and right basilar atelectasis, questionable infiltrate.  Urinalysis showed increased blood cell count more than 50 but negative nitrates.  Urine culture is pending.  Since admission, the patient has remained obtunded.  She is currently on Zosyn IV for antibiotic coverage.  She is not able to tolerate oral medication, she is on lactulose enemas every 12 hours.  Upon review of her medical record, The patient was recently diagnosed with liver cirrhosis at: [facility] during her admission in January 2022.  She was admitted at that time to the hospital for evaluation of large right-sided pleural effusion.  She underwent thoracentesis at that time removal 1.7 L of fluid.  Was also noted to have elevation of her aminotransferases but her liver ultrasound at that time showed findings suggestive of cirrhosis with some perihepatic ascites.  Underwent subsequent Pleurx catheter placement for recurrent effusion in the right side of her chest.  For evaluation of her elevated liver enzymes, she underwent a transjugular liver biopsy on 12/23/2020 which showed a gradient of, liver cirrhosis was found in her biopsies with presence of active steatohepatitis grade 2-3/3.  Stains were negative for PAS.  Last EGD/EUS was performed by Dr. Ardis Hughs on  12/21/2020. She was noted to have Candida esophagitis and gastritis.  Biopsies were negative for H. pylori.  Endoscopic ultrasound revealed small amount of ascites benign-appearing lymph nodes in porta hepatis but no evidence of choledocholithiasis  Notably, patient presented with 1 episode of agitation in the morning received 1 dose of Ativan IV.  Past Medical History: SEE CHRONIC ISSSUES: Past Medical History:   Diagnosis Date  . Anxiety   . Chronic back pain   . Closed fracture of left olecranon process 08/11/2020  . DDD (degenerative disc disease), cervical   . DDD (degenerative disc disease), lumbosacral   . Depression   . GERD (gastroesophageal reflux disease)   . Hiatal hernia   . History of adenomatous polyp of colon    2009  tubular adenoma  . History of esophagitis   . History of idiopathic seizure    1984  x1 after vaginal delivery (per pt negative work-up and no issue since)  . History of left shoulder fracture    01/ 2014  proximal humerus fx  . Hyperlipidemia   . Left ulnar fracture 08/11/2020  . OA (osteoarthritis)    knees and thumbs  . RLS (restless legs syndrome)   . Supraumbilical hernia   . Umbilical hernia   . Wears dentures    lower   Past Surgical History:  Past Surgical History:  Procedure Laterality Date  . APPLICATION OF WOUND VAC N/A 08/08/2020   Procedure: APPLICATION OF WOUND VAC;  Surgeon: Kinsinger, Arta Bruce, MD;  Location: [place];  Service: General;  Laterality: N/A;  . BIOPSY N/A 12/21/2020   Procedure: BIOPSY;  Surgeon: Irving Copas., MD;  Location: [place];  Service: Gastroenterology;  Laterality: N/A;  . CARDIOVASCULAR STRESS TEST  05/06/2010   Lexiscan nuclear study w/ no exercise/  probably normal , per images there is a large reversible defect in the mid to distal anterior wall, this seems to be shifting breast attenuation/  normal LV function and wall motion , ef 67%  . CARPAL TUNNEL RELEASE Bilateral 2011   excision ganglion cyst left wrist  . CATARACT EXTRACTION W/ INTRAOCULAR LENS  IMPLANT, BILATERAL  09 and 10/ 2017  . CHEST TUBE INSERTION Right 12/20/2020   Procedure: INSERTION PLEURAL DRAINAGE CATHETER;  Surgeon: [name], MD;  Location: [place];  Service: Thoracic;  Laterality: Right;  . COLONOSCOPY  05/08/2008  . COLONOSCOPY N/A 09/15/2018   Procedure: COLONOSCOPY;  Surgeon: Rogene Houston, MD;  [facility];  Service: Endoscopy;  Laterality: N/A;  12:00  . ESOPHAGOGASTRODUODENOSCOPY (EGD) WITH PROPOFOL N/A 12/21/2020   Procedure: ESOPHAGOGASTRODUODENOSCOPY (EGD) WITH PROPOFOL;  Surgeon: Rush Landmark Telford Nab., MD;  Location: [place];  Service: Gastroenterology;  Laterality: N/A;  . EUS N/A 12/21/2020   Procedure: UPPER ENDOSCOPIC ULTRASOUND (EUS) LINEAR;  Surgeon: Irving Copas., MD;  Location: [place];  Service: Gastroenterology;  Laterality: N/A;  . IR PERC PLEURAL DRAIN W/INDWELL CATH W/IMG GUIDE  12/13/2020  . IR TRANSCATHETER BX  12/23/2020  . IR US GUIDE VASC ACCESS RIGHT  12/23/2020  . IR VENOGRAM HEPATIC W HEMODYNAMIC EVALUATION  12/23/2020  . LAPAROSCOPIC CHOLECYSTECTOMY  2012  . LAPAROTOMY N/A 08/08/2020   Procedure: EXPLORATORY LAPAROTOMY, ILEOCECECTOMY WITH ANASTOMOSIS, MECKELS RESSECTION;  Surgeon: Kinsinger, Arta Bruce, MD;  Location: [place];  Service: General;  Laterality: N/A;  . ORIF ELBOW FRACTURE Left 08/10/2020   Procedure: OPEN REDUCTION INTERNAL FIXATION (ORIF) OLECRANON and ULNA FRACTURE;  Surgeon: Altamese [place], MD;  Location: [facility];  Service: Orthopedics;  Laterality: Left;  . SHOULDER ARTHROSCOPY/  ACROMIOPLASTY/  DISTAL CLAVICAL RESECTION/  DEBRIDEMENT LABRAL TEAR Left 02/09/2005  . TONSILLECTOMY AND ADENOIDECTOMY  age 82  . TUBAL LIGATION Bilateral yrs ago  . VENTRAL HERNIA REPAIR N/A 12/31/2016   Procedure: LAPAROSCOPIC VENTRAL WALL HERNIA REPAIR WITH ERAS PATHWAY;  Surgeon:  Boston, MD;  Location: [place];  Service: General;  Laterality: N/A;   Family History:  Family History  Problem Relation Age of Onset  . Hypertension Mother   . Kidney disease Mother   . Diabetes Mother   . Pulmonary embolism Father 36  . Obesity Sister    Social History:  Social History   Tobacco Use  . Smoking status: Former Smoker    Packs/day: 0.50    Years: 40.00    Pack years: 20.00    Types: Cigarettes    Start date: 02/20/1973    Quit  date: 06/16/2013    Years since quitting: 7.6  . Smokeless tobacco: Never Used  Vaping Use  . Vaping Use: Former  Substance Use Topics  . Alcohol use: No  . Drug use: No    Home Medications:  Prior to Admission medications   Medication Sig Start Date End Date Taking? Authorizing Provider  albuterol (PROVENTIL HFA;VENTOLIN HFA) 108 (90 Base) MCG/ACT inhaler Inhale 2 puffs into the lungs every 6 (six) hours as needed for wheezing or shortness of breath.   Yes [provider]  budesonide-formoterol (SYMBICORT) 80-4.5 MCG/ACT inhaler Inhale 2 puffs by mouth twice daily Patient taking differently: Inhale 2 puffs into the lungs 2 (two) times daily. 08/01/19  Yes Hawks, Christy A, FNP  buPROPion (WELLBUTRIN XL) 150 MG 24 hr tablet Take 1 tablet by mouth once daily Patient taking differently: Take 150 mg by mouth every morning. 07/08/20  Yes Hawks, Christy A, FNP  busPIRone (BUSPAR) 5 MG tablet Take 1 tablet by mouth three times daily as needed 01/13/21  Yes Hawks, Christy A, FNP  Cholecalciferol (VITAMIN D) 125 MCG (5000 UT) CAPS Take 1 capsule by mouth daily. 08/23/20  Yes Ainsley Spinner, PA-C  clotrimazole (MYCELEX) 10 MG troche Take 1 tablet (10 mg total) by mouth 5 (five) times daily. 01/14/21  Yes Hawks, Christy A, FNP  FLUoxetine (PROZAC) 40 MG capsule Take 2 capsules (80 mg total) by mouth daily. Patient taking differently: Take 40 mg by mouth daily. 12/05/20  Yes Hawks, Christy A, FNP  lactulose (CHRONULAC) 10 GM/15ML solution Take 20 g (30 mL) 2-3 times a day for goal of 2-3 bowel movements a day 01/10/21  Yes Mercy Riding, MD  meclizine (ANTIVERT) 25 MG tablet Take 25 mg by mouth 3 (three) times daily as needed for dizziness.   Yes [provider]  Menthol, Topical Analgesic, (BIOFREEZE ROLL-ON EX) Apply 1 application topically daily as needed (pain).   Yes [provider]  midodrine (PROAMATINE) 5 MG tablet Take 3 tablets (15 mg total) by mouth 3 (three) times daily with  meals. 01/14/21  Yes Hawks, Christy A, FNP  Multiple Vitamin (MULTIVITAMIN WITH MINERALS) TABS tablet Take 1 tablet by mouth every morning.   Yes [provider]  ondansetron (ZOFRAN) 4 MG tablet Take 1 tablet (4 mg total) by mouth every 8 (eight) hours as needed for nausea or vomiting. 01/17/21  Yes [name], PA  pantoprazole (PROTONIX) 20 MG tablet Take 1 tablet by mouth once daily Patient taking differently: Take 20 mg by mouth every morning. 07/08/20  Yes Hawks, Christy A, FNP  rifaximin (XIFAXAN) 550 MG TABS tablet Take 1 tablet (550 mg total) by mouth 2 (two) times daily. 01/14/21  Yes Hawks, Christy A, FNP  sodium bicarbonate 650 MG tablet Take 1 tablet (650 mg total) by mouth 3 (three) times daily. 01/10/21  Yes Mercy Riding, MD  diclofenac Sodium (VOLTAREN) 1 % GEL Apply 1 application topically 2 (two) times daily as needed (pain). Patient not taking: Reported on 01/24/2021    [provider]  famotidine (PEPCID) 20 MG tablet Take 1 tablet (20 mg total) by mouth at bedtime. Patient not taking: Reported on 01/24/2021 03/01/20   Sharion Balloon, FNP    Inpatient Medications:  Current Facility-Administered Medications:  .  albumin human 25 % solution 25 g, 25 g, Intravenous, Q6H, Tat, David, MD .  albuterol (VENTOLIN HFA) 108 (90 Base) MCG/ACT inhaler 2 puff, 2 puff, Inhalation, Q6H PRN, Truett Mainland, DO .  buPROPion (WELLBUTRIN XL) 24 hr tablet 150 mg, 150 mg, Oral, q morning, Stinson, Jacob J, DO .  busPIRone (BUSPAR) tablet 5 mg, 5 mg, Oral, TID PRN, Truett Mainland, DO .  Chlorhexidine Gluconate Cloth 2 % PADS 6 each, 6 each, Topical, Daily, Truett Mainland, DO, 6 each at 01/25/21 0815 .  feeding supplement (ENSURE ENLIVE / ENSURE PLUS) liquid 237 mL, 237 mL, Oral, BID BM, Stinson, Jacob J, DO .  FLUoxetine (PROZAC) capsule 40 mg, 40 mg, Oral, Daily, Stinson, Jacob J, DO .  fluticasone furoate-vilanterol (BREO ELLIPTA) 100-25 MCG/INH 1 puff, 1 puff,  Inhalation, Daily, Truett Mainland, DO, 1 puff at 01/25/21 0801 .  folic acid injection 1 mg, 1 mg, Intravenous, Daily, Truett Mainland, DO, 1 mg at 01/25/21 1610 .  lactulose (CHRONULAC) 10 GM/15ML solution 20 g, 20 g, Oral, Once, Truett Mainland, DO .  LORazepam (ATIVAN) injection 0.5 mg, 0.5 mg, Intravenous, Q6H PRN, Truett Mainland, DO .  midodrine (PROAMATINE) tablet 15 mg, 15 mg, Oral, TID WC, Stinson, Jacob J, DO .  piperacillin-tazobactam (ZOSYN) IVPB 3.375 g, 3.375 g, Intravenous, Q12H, Tat, David, MD .  rifaximin Doreene Nest) tablet 550 mg, 550 mg, Oral, BID, Stinson, Jacob J, DO .  sodium bicarbonate tablet 650 mg, 650 mg, Oral, TID, Stinson, Jacob J, DO .  thiamine (B-1) injection 100 mg, 100 mg, Intravenous, Daily, Truett Mainland, DO, 100 mg at 01/25/21 9604 Allergies: Patient has no known allergies.  Complete Review of Systems: GENERAL: negative for malaise, night sweats HEENT: No changes in hearing or vision, no nose bleeds or other nasal problems. NECK: Negative for lumps, goiter, pain and significant neck swelling RESPIRATORY: Negative for cough, wheezing CARDIOVASCULAR: Negative for chest pain, leg swelling, palpitations, orthopnea GI: SEE HPI MUSCULOSKELETAL: Negative for joint pain or swelling, back pain, and muscle pain. SKIN: Negative for lesions, rash PSYCH: Negative for sleep disturbance, mood disorder and recent psychosocial stressors. HEMATOLOGY Negative for prolonged bleeding, bruising easily, and swollen nodes. ENDOCRINE: Negative for cold or heat intolerance, polyuria, polydipsia and goiter. NEURO: negative for tremor, gait imbalance, syncope and seizures. The remainder of the review of systems is noncontributory.  Physical Exam: BP (!) 120/42 (BP Location: Right Arm)   Pulse 91   Temp 98.7 F (37.1 C)   Resp 18   Ht 5' 5" (1.651 m)   Wt 79.1 kg   SpO2 93%   BMI 29.02 kg/m  GENERAL: The patient is obtunded but only responds to painful stimuli, in  no acute  distress.  Currently using nasal cannula. HEENT: Head is normocephalic and atraumatic. EOMI are intact. Mouth is well hydrated and without lesions. NECK: Supple. No masses LUNGS: Clear to auscultation. No presence of rhonchi/wheezing/rales. Adequate chest expansion HEART: RRR, normal s1 and s2. ABDOMEN: Soft, nontender, no guarding, no peritoneal signs,  nondistended but with very mild shifting dullness. BS +. No masses. EXTREMITIES: Without any cyanosis, clubbing, rash, lesions or edema. NEUROLOGIC: Obtunded and only moves her extremities with painful stimuli, no focal motor deficit. SKIN: no jaundice, no rashes  Laboratory Data CBC:     Component Value Date/Time   WBC 15.2 (H) 01/25/2021 0508   RBC 3.01 (L) 01/25/2021 0508   HGB 9.3 (L) 01/25/2021 0508   HGB 10.8 (L) 01/14/2021 1655   HCT 26.3 (L) 01/25/2021 0508   HCT 30.0 (L) 01/14/2021 1655   PLT 149 (L) 01/25/2021 0508   PLT 320 01/14/2021 1655   MCV 87.4 01/25/2021 0508   MCV 83 01/14/2021 1655   MCH 30.9 01/25/2021 0508   MCHC 35.4 01/25/2021 0508   RDW 28.8 (H) 01/25/2021 0508   RDW 25.2 (H) 01/14/2021 1655   LYMPHSABS 1.2 01/24/2021 1616   LYMPHSABS 1.7 01/14/2021 1655   MONOABS 1.1 (H) 01/24/2021 1616   EOSABS 0.1 01/24/2021 1616   EOSABS 0.1 01/14/2021 1655   BASOSABS 0.1 01/24/2021 1616   BASOSABS 0.1 01/14/2021 1655   COAG:  Lab Results  Component Value Date   INR 2.4 (H) 01/25/2021   INR 2.2 (H) 01/24/2021   INR 2.0 (H) 01/23/2021    BMP:  BMP Latest Ref Rng & Units 01/25/2021 01/24/2021 01/14/2021  Glucose 70 - 99 mg/dL 106(H) 132(H) 127(H)  BUN 8 - 23 mg/dL 59(H) 59(H) 26  Creatinine 0.44 - 1.00 mg/dL 4.12(H) 4.14(H) 1.96(H)  BUN/Creat Ratio 12 - 28 - - 13  Sodium 135 - 145 mmol/L 136 131(L) 131(L)  Potassium 3.5 - 5.1 mmol/L 3.7 3.5 4.0  Chloride 98 - 111 mmol/L 106 100 101  CO2 22 - 32 mmol/L 17(L) 16(L) 12(L)  Calcium 8.9 - 10.3 mg/dL 8.9 8.8(L) 9.3    HEPATIC:  Hepatic Function Latest  Ref Rng & Units 01/25/2021 01/24/2021 01/14/2021  Total Protein 6.5 - 8.1 g/dL 6.0(L) 6.6 6.9  Albumin 3.5 - 5.0 g/dL 2.7(L) 2.8(L) 3.7(L)  AST 15 - 41 U/L 267(H) 284(H) 380(H)  ALT 0 - 44 U/L 95(H) 108(H) 101(H)  Alk Phosphatase 38 - 126 U/L 148(H) 172(H) 222(H)  Total Bilirubin 0.3 - 1.2 mg/dL 8.4(H) 9.1(H) 6.5(H)  Bilirubin, Direct 0.00 - 0.40 mg/dL - - 2.86(H)    CARDIAC:  Lab Results  Component Value Date   CKTOTAL 35 (L) 12/17/2020   TROPONINI 0.07 ([place]) 01/17/2018     Imaging: I personally reviewed and interpreted the available imaging.  Assessment & Plan: Lori Cooper is a 65 y.o. 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  The patient has presented evidence of decompensation of her liver disease as evidenced by her hepatic encephalopathy and trace ascites.  Given the alterations in her mental status and worsening of her bilirubin and renal function, I consider she is currently presenting acute on chronic liver failure (MELD-Na 38 today).  We will need to rule out infectious  cause as her insulting event.  She is currently receiving broad antibiotic coverage which could cover either urinary or pulmonary sources.  We will need to follow urine culture result and consider obtaining respiratory cultures, will also need to follow blood culture for her. Zosyn should be continued at this moment to provide broad coverage of bacteria infection. We will also need to check hepatitis B virus DNA and hepatitis C virus RNA, EBV, HSV and CMV as she is also presenting worsening aminotransferases.  4  In terms of her worsening kidney disease, she could have some component of  hepatorenal syndrome although she is not presenting any large ascites.  Will need to check the urine electrolytes urine sodium to confirm this diagnosis further (she has not been on Lasix recently).  Currently she is on midodrine maintain adequate MAPs, but will benefit from starting appetite 100 mcg every 12 hours subq and continuing with Albumin IV as suggested by nephrology.  Regarding her mental status, will need to continue with rectal enemas of lactulose every 8 hours but may need to place an NG tube to improve her mental status with Xifaxan and lactulose if not clinically improving.  We will also recommend avoiding benzos or other sedatives that can further impair her mental status.  Finally, it would be important to obtain a survey of her abdomen with ultrasound to determine if a paracentesis can be performed to rule out SBP.  I discussed this complex case with Dr. Wynonia Musty (intesivist) and Dr. Verne Spurr (hepatologist) at [facility] who considered appropriate to transfer the patient to their facility for evaluation for liver transplant.  The patient will be transferred to the ICU for now for further close monitoring.  Total time spent in evaluation of the medical chart, evaluation of the patient and performing arrangements for further care in this case of a patient with critical multisystemic disease: 60 minutes  Harvel Quale, MD Gastroenterology and Hepatology [facility]

## 2021-01-25 NOTE — Progress Notes (Signed)
Case discussed with GI, Dr. Cherlynn June. He discussed case with [facility] hepatology and CCM. Dr. Cherlynn June informed me that the patient has been accepted for transfer to [facility]. I tried to call patient's daughter x 2 to inform her of the transfer.  No answer, but I left VM.  I was able to reach patient's sister, Freda Munro.  I updated her on patient's condition and on the pending transfer to [facility].  She agreed with the transfer and stated she would try to reach patient's daughter.  Orson Eva, DO

## 2021-01-25 NOTE — Progress Notes (Signed)
Care link on unit to transfer patient

## 2021-01-25 NOTE — Discharge Summary (Signed)
Physician Discharge Summary  Lori Cooper [id number] DOB: Feb 17, 1956 DOA: 01/24/2021  PCP: Sharion Balloon, FNP  Admit date: 01/24/2021 Discharge date: 01/25/2021  Admitted From: Home Disposition:  Transfer to [facility]  Recommendations for Outpatient Follow-up:  1. Follow up with PCP in 1-2 weeks 2. Please obtain BMP/CBC in one week    Discharge Condition: Stable CODE STATUS: FULL Diet recommendation: Low Na   Brief/Interim Summary: 65 year old female with a history of NASH Cirrhosis, PE 01/18/2018, GERD, anxiety, hyperlipidemia presenting on 01/24/2021 secondary to sliding out of her chair.  Although the initial chief complaint was that of the patient having a "fall", it appears that by the daughter's description the patient actually slid out of her recliner chair and hit her head against the side of the bed rail.  According to the patient's daughter, there was no loss of consciousness.  At the time of my evaluation, the patient is somnolent and is unable to provide any history.  History is obtained from review of the medical record and speaking with the patient's daughter at the bedside.  According to the patient's daughter, the patient has had increasing confusion over the past week.  She endorsed that the patient had been taking her lactulose having about 2 bowel movements on a daily basis.  There was no report of fevers, chills, chest pain, shortness of breath, coughing, hematochezia, melena.  Daughter stated that the patient's only new medication was Zofran.  Apparently, the patient had episodes of nausea and vomiting approximately 2 weeks ago which have been relieved with Zofran.  The daughter stated that the patient has not had any further emesis.  There is no complaints of dysuria or hematuria.  Because of the patient's confusion and concerns for the "fall", the patient was brought to the emergency department for further evaluation.  Evaluation in the ED showed that the  patient had ammonia of 74 with serum creatinine of 4.14 which is above her usual baseline of 1.3-1.6.  WBC was 15.7 with hemoglobin 10.1 and platelets 169,000.  Chest x-ray showed basal opacities, regular in the left thought to be atelectasis versus infiltrate.  At the time of admission, AST 284, ALT 108, alkaline phosphatase 172, total bilirubin 9.1.  CT of the brain was negative for any acute findings.  Remainder her BMP showed sodium 131, potassium 3.5, chloride 108, CO2 16.  COVID-19 PCR was negative.  EKG shows sinus rhythm with nonspecific T wave changes.  The patient was given a lactulose enema.  The patient was started on IV fluids.  On the morning of 01/25/2021, the patient remained somnolent with ammonia up to 109.  Repeat lactulose enema was ordered.  The patient's daughter at the bedside requested transfer to [facility].  Notably, the patient had a recent prolonged hospitalization at [facility] from 12/09/2020 to 01/10/2021.  During that hospitalization, liver biopsy on 12/23/2020 showed moderate to severely active steatohepatitis with cirrhosis.  She was initially transferred secondary to a large right pleural effusion with concerns of a right hemidiaphragm defect.  Cardiothoracic surgery was consulted and continue to follow the patient.  She underwent thoracocentesis initially on 12/10/2020 which showed a transudate of fluid.  Cardiothoracic surgery did not feel that the patient's diaphragmatic defect was not appreciable to the point of warranting a thoracotomy.  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  A Pleurx catheter was initially inserted on 12/05/2020.  It was dislodged on 01/06/2021.  After numerous discussion with  the patient's daughter, they did not want the Pleurx catheter at that time to be reinserted and the patient was discharged home in stable condition as her right pleural fluid did not significantly reaccumulate.  She ultimately followed up in the  office with cardiothoracic surgery, Dr. Servando Snare on 01/16/21.  Chest x-ray at that time did not show any significant reaccumulation of her right pleural fluid, therefore no further invasive procedures were indicated.  Apparently, the patient has seen her usual gastroenterologist, Dr. Laural Golden, since discharge from the hospital, and he referred her to [facility] for possible liver transplant evaluation, but daughter has yet to receive a call for any appointment.  Discharge Diagnoses:   Acute metabolic encephalopathy -Multifactorial including acute on chronic renal failure and hepatic encephalopathy and possible infectious process -Remains somnolent -continue lactulose enemas -personally reviewed EKG--sinus, nonspecific T-wave changes -UA >50 WBC, suggested UTI -urine culture ordered and sent  Hepatic encephalopathy -Continue lactulose enemas until the patient is more alert to take p.o. -01/25/2021 ammonia 74>>109 -Restart rifaximin once the patient is awake enough to take p.o.  Acute on chronic renal failure--CKD stage IIIb -Baseline creatinine 1.3-1.6 -likely hemodynamically/volume mediated -Concern about hepatorenal syndrome -Nephrology consult-spoke with Dr. Joylene Grapes -Renal ultrasound--normal appearing kidneys, ascites present -Obtain urinalysis -started albumin IV -continue IVF  Pulmonary infiltrates -Started empiric zosyn -Check procalcitonin--0.41 -Concerned about pneumonia, possibly aspiration -personally reviewed CXR--bibasilar opacities R>L  Decompensated NASH Liver Cirrhosis -continue lactulose as discussed -GI consulted--Dr. Cherlynn June -I have called [facility] to request transfer--denied -Dr. Cherlynn June spoke with hepatology and CCM at [facility] who accepted patient in transfer -MELD score = 38  Hyponatremia -Multifactorial including volume depletion and Liver cirrhosis -stable, improving with IV NS  History pulmonary embolus -diagnosed March  2019 -had been on apixaban up until d/c from hospital on 01/10/21    Discharge Instructions   Allergies as of 01/25/2021   No Known Allergies     Medication List    STOP taking these medications   BIOFREEZE ROLL-ON EX   clotrimazole 10 MG troche Commonly known as: MYCELEX   diclofenac Sodium 1 % Gel Commonly known as: VOLTAREN   famotidine 20 MG tablet Commonly known as: Pepcid   meclizine 25 MG tablet Commonly known as: ANTIVERT   ondansetron 4 MG tablet Commonly known as: ZOFRAN     TAKE these medications   albuterol 108 (90 Base) MCG/ACT inhaler Commonly known as: VENTOLIN HFA Inhale 2 puffs into the lungs every 6 (six) hours as needed for wheezing or shortness of breath.   budesonide-formoterol 80-4.5 MCG/ACT inhaler Commonly known as: SYMBICORT Inhale 2 puffs by mouth twice daily   buPROPion 150 MG 24 hr tablet Commonly known as: WELLBUTRIN XL Take 1 tablet by mouth once daily What changed: when to take this   busPIRone 5 MG tablet Commonly known as: BUSPAR Take 1 tablet by mouth three times daily as needed   FLUoxetine 40 MG capsule Commonly known as: PROZAC Take 2 capsules (80 mg total) by mouth daily. What changed: how much to take   lactulose 10 GM/15ML solution Commonly known as: CHRONULAC Take 20 g (30 mL) 2-3 times a day for goal of 2-3 bowel movements a day   midodrine 5 MG tablet Commonly known as: PROAMATINE Take 3 tablets (15 mg total) by mouth 3 (three) times daily with meals.   multivitamin with minerals Tabs tablet Take 1 tablet by mouth every morning.   octreotide 50 MCG/ML Soln injection Commonly known as: SANDOSTATIN Inject 2  mLs (100 mcg total) into the skin every 8 (eight) hours.   pantoprazole 20 MG tablet Commonly known as: PROTONIX Take 1 tablet by mouth once daily What changed: when to take this   piperacillin-tazobactam 3.375 (3-0.375) g injection Commonly known as: ZOSYN Inject 3,375 mg (3.375 g total) into  the vein every 8 (eight) hours.   rifaximin 550 MG Tabs tablet Commonly known as: Xifaxan Take 1 tablet (550 mg total) by mouth 2 (two) times daily.   sodium bicarbonate 650 MG tablet Take 1 tablet (650 mg total) by mouth 3 (three) times daily.   Vitamin D 125 MCG (5000 UT) Caps Take 1 capsule by mouth daily.       No Known Allergies  Consultations:  GI  renal   Procedures/Studies: DG Chest 1 View  Result Date: 01/09/2021 CLINICAL DATA:  Prior chest tube removal. EXAM: CHEST  1 VIEW COMPARISON:  01/08/2021. FINDINGS: Heart size stable. Persistent bilateral interstitial prominence, no change from prior exam. No pleural effusion or pneumothorax. Surgical clips right upper quadrant. IMPRESSION: 1. Persistent bilateral interstitial prominence, no change from prior exam. 2.  No evidence of pneumothorax following recent chest tube removal. Electronically Signed   By: [place]   On: 01/09/2021 06:26   DG Chest 2 View  Result Date: 01/14/2021 CLINICAL DATA:  Pleural effusion EXAM: CHEST - 2 VIEW COMPARISON:  01/09/2021, CT 12/09/2020 FINDINGS: Mild cardiomegaly. Subsegmental atelectasis in the right upper lung. Improved aeration. Elevated right diaphragm versus residual sub pulmonic effusion. No pneumothorax. IMPRESSION: Improved aeration. Elevated right diaphragm versus residual sub pulmonic effusion. Electronically Signed   By: Donavan Foil M.D.   On: 01/14/2021 16:33   CT Head Wo Contrast  Result Date: 01/24/2021 CLINICAL DATA:  Status post fall. EXAM: CT HEAD WITHOUT CONTRAST TECHNIQUE: Contiguous axial images were obtained from the base of the skull through the vertex without intravenous contrast. COMPARISON:  None. FINDINGS: Brain: There is very mild cerebral atrophy with widening of the extra-axial spaces and ventricular dilatation. There are areas of very mildly decreased attenuation within the white matter tracts of the supratentorial brain, consistent with early  microvascular disease changes. A small chronic right basal ganglia lacunar infarct is seen. Vascular: No hyperdense vessel or unexpected calcification. Skull: Normal. Negative for fracture or focal lesion. Sinuses/Orbits: No acute finding. Other: None. IMPRESSION: 1. Very mild cerebral atrophy and early microvascular disease changes of the supratentorial brain. 2. Small chronic right basal ganglia lacunar infarct. 3. No acute intracranial abnormality. Electronically Signed   By: Virgina Norfolk M.D.   On: 01/24/2021 16:09   US RENAL  Result Date: 01/25/2021 CLINICAL DATA:  Renal failure EXAM: RENAL / URINARY TRACT ULTRASOUND COMPLETE COMPARISON:  CT abdomen and pelvis December 22, 2020 FINDINGS: Right Kidney: Renal measurements: 10.7 x 4.8 x 4.9 cm = volume: 132 mL. Echogenicity and renal cortical thickness are within normal limits. No mass, perinephric fluid, or hydronephrosis visualized. No sonographically demonstrable calculus or ureterectasis. Left Kidney: Renal measurements: 10.6 x 5.8 x 5.1 cm = volume: 162 mL. Echogenicity and renal cortical thickness are within normal limits. No mass, perinephric fluid, or hydronephrosis visualized. No sonographically demonstrable calculus or ureterectasis. Bladder: Appears normal for degree of bladder distention. Flow from each distal ureter seen in the bladder. Other: There is a fairly mild degree of ascites. IMPRESSION: Normal appearing kidneys bilaterally. No bladder lesions evident by ultrasound. Ascites present. Electronically Signed   By: Lowella Grip III M.D.   On: 01/25/2021 13:07  DG Chest Portable 1 View  Result Date: 01/24/2021 CLINICAL DATA:  Status post fall. EXAM: PORTABLE CHEST 1 VIEW COMPARISON:  January 14, 2021 FINDINGS: Decreased lung volumes are seen which is likely secondary to the degree of patient inspiration. Chronic appearing increased interstitial lung markings are seen. Ill-defined, patchy areas of atelectasis and/or infiltrate are  seen within the lateral aspect of the right lung base and bilateral apices. This represents a new finding when compared to the prior study. There is stable elevation of the right hemidiaphragm. There is no evidence of a pleural effusion or pneumothorax. Moderate to marked severity cardiomegaly is noted. Marked severity calcification of the aortic arch is seen. The visualized skeletal structures are unremarkable. IMPRESSION: Stable cardiomegaly with interval development of mild biapical and right basilar atelectasis and/or infiltrate since the prior study. Electronically Signed   By: Virgina Norfolk M.D.   On: 01/24/2021 16:25   DG Chest Port 1 View  Result Date: 01/08/2021 CLINICAL DATA:  Evaluate for pleural effusion. EXAM: PORTABLE CHEST 1 VIEW COMPARISON:  01/05/2021. FINDINGS: Aortic atherosclerosis. Interval removal of right pleural drain. No pneumothorax identified. Diffuse, bilateral reticular and nodular pulmonary opacities are unchanged when compared with 01/05/2021. IMPRESSION: 1. No pneumothorax status post removal of right pleural drain. 2. Persistent interstitial and airspace opacities, unchanged. Electronically Signed   By: Kerby Moors M.D.   On: 01/08/2021 19:34   DG Chest Port 1 View  Result Date: 01/05/2021 CLINICAL DATA:  65 year old female with shortness of breath EXAM: PORTABLE CHEST 1 VIEW COMPARISON:  Multiple prior most recent 01/03/2021 FINDINGS: Cardiomediastinal silhouette unchanged. Reticular opacities of the right greater than left lungs. No pneumothorax. Pleural drain again demonstrated on the right, unchanged in position. No displaced fracture. IMPRESSION: Similar appearance of the chest x-ray with right greater than left mixed interstitial and airspace disease. Unchanged right pleural drain with no pneumothorax or pleural effusion. Electronically Signed   By: Corrie Mckusick D.O.   On: 01/05/2021 09:31   DG CHEST PORT 1 VIEW  Result Date: 01/03/2021 CLINICAL DATA:   Pleural effusion EXAM: PORTABLE CHEST 1 VIEW COMPARISON:  01/01/2021 FINDINGS: Right PleurX catheter remains in place, unchanged. No visible effusion. Diffuse airspace disease throughout the lungs, right greater than left, slightly worsened on the right since prior study. Heart is upper limits normal in size. No pneumothorax. Aortic atherosclerosis. IMPRESSION: Bilateral airspace disease, right greater than left, slightly worsening since prior study. Right PleurX catheter.  No visible effusion or pneumothorax. Electronically Signed   By: Rolm Baptise M.D.   On: 01/03/2021 07:20   DG CHEST PORT 1 VIEW  Result Date: 01/01/2021 CLINICAL DATA:  Shortness of breath. EXAM: PORTABLE CHEST 1 VIEW COMPARISON:  12/31/2020.  12/21/2020. FINDINGS: Right chest tube in stable position. No pneumothorax. Heart size stable. Low lung volumes. Diffuse bilateral pulmonary infiltrates/edema again noted without interim change. No pleural effusion. IMPRESSION: 1. Right chest tube in stable position. No pneumothorax. 2. Low lung volumes. Diffuse bilateral pulmonary infiltrates/edema again noted without interim change. Electronically Signed   By: Marcello Moores  Register   On: 01/01/2021 06:04   DG CHEST PORT 1 VIEW  Result Date: 12/31/2020 CLINICAL DATA:  Inpatient, shortness of breath. EXAM: PORTABLE CHEST 1 VIEW COMPARISON:  12/21/2020 FINDINGS: Right chest tube overlies the lower hemithorax. The heart size and mediastinal contours are unchanged. Hilar vasculature prominence. Aortic arch calcification. No focal consolidation. Interval increase in interstitial markings that are more prominent within the upper lobes. Trace right pleural effusion. No left  pleural effusion. Previously identified right apical pneumothorax not visualized. No left neumothorax. No acute osseous abnormality. IMPRESSION: 1. Pulmonary edema. 2. Trace right pleural effusion. 3. Interval resolution of right apical pneumothorax. Electronically Signed   By: Iven Finn M.D.   On: 12/31/2020 05:32        Discharge Exam: Vitals:   01/25/21 0900 01/25/21 1323  BP: (!) 120/42 (!) 117/46  Pulse: 91 93  Resp: 18 18  Temp: 98.7 F (37.1 C) 98.1 F (36.7 C)  SpO2: 93% 94%   Vitals:   01/25/21 0432 01/25/21 0801 01/25/21 0900 01/25/21 1323  BP: (!) 93/47  (!) 120/42 (!) 117/46  Pulse: 93  91 93  Resp: '18  18 18  '$ Temp: 98.1 F (36.7 C)  98.7 F (37.1 C) 98.1 F (36.7 C)  TempSrc: Oral   Oral  SpO2: 95% 93% 93% 94%  Weight:      Height:        General: Pt somnolent but arousable, not in acute distress Cardiovascular: RRR, S1/S2 +, no rubs, no gallops Respiratory: bibasilar rales Abdominal: Soft, NT, ND, bowel sounds + Extremities: no edema, no cyanosis   The results of significant diagnostics from this hospitalization (including imaging, microbiology, ancillary and laboratory) are listed below for reference.    Significant Diagnostic Studies: DG Chest 1 View  Result Date: 01/09/2021 CLINICAL DATA:  Prior chest tube removal. EXAM: CHEST  1 VIEW COMPARISON:  01/08/2021. FINDINGS: Heart size stable. Persistent bilateral interstitial prominence, no change from prior exam. No pleural effusion or pneumothorax. Surgical clips right upper quadrant. IMPRESSION: 1. Persistent bilateral interstitial prominence, no change from prior exam. 2.  No evidence of pneumothorax following recent chest tube removal. Electronically Signed   By: [place]   On: 01/09/2021 06:26   DG Chest 2 View  Result Date: 01/14/2021 CLINICAL DATA:  Pleural effusion EXAM: CHEST - 2 VIEW COMPARISON:  01/09/2021, CT 12/09/2020 FINDINGS: Mild cardiomegaly. Subsegmental atelectasis in the right upper lung. Improved aeration. Elevated right diaphragm versus residual sub pulmonic effusion. No pneumothorax. IMPRESSION: Improved aeration. Elevated right diaphragm versus residual sub pulmonic effusion. Electronically Signed   By: Donavan Foil M.D.   On: 01/14/2021 16:33    CT Head Wo Contrast  Result Date: 01/24/2021 CLINICAL DATA:  Status post fall. EXAM: CT HEAD WITHOUT CONTRAST TECHNIQUE: Contiguous axial images were obtained from the base of the skull through the vertex without intravenous contrast. COMPARISON:  None. FINDINGS: Brain: There is very mild cerebral atrophy with widening of the extra-axial spaces and ventricular dilatation. There are areas of very mildly decreased attenuation within the white matter tracts of the supratentorial brain, consistent with early microvascular disease changes. A small chronic right basal ganglia lacunar infarct is seen. Vascular: No hyperdense vessel or unexpected calcification. Skull: Normal. Negative for fracture or focal lesion. Sinuses/Orbits: No acute finding. Other: None. IMPRESSION: 1. Very mild cerebral atrophy and early microvascular disease changes of the supratentorial brain. 2. Small chronic right basal ganglia lacunar infarct. 3. No acute intracranial abnormality. Electronically Signed   By: Virgina Norfolk M.D.   On: 01/24/2021 16:09   US RENAL  Result Date: 01/25/2021 CLINICAL DATA:  Renal failure EXAM: RENAL / URINARY TRACT ULTRASOUND COMPLETE COMPARISON:  CT abdomen and pelvis December 22, 2020 FINDINGS: Right Kidney: Renal measurements: 10.7 x 4.8 x 4.9 cm = volume: 132 mL. Echogenicity and renal cortical thickness are within normal limits. No mass, perinephric fluid, or hydronephrosis visualized. No sonographically demonstrable calculus or  ureterectasis. Left Kidney: Renal measurements: 10.6 x 5.8 x 5.1 cm = volume: 162 mL. Echogenicity and renal cortical thickness are within normal limits. No mass, perinephric fluid, or hydronephrosis visualized. No sonographically demonstrable calculus or ureterectasis. Bladder: Appears normal for degree of bladder distention. Flow from each distal ureter seen in the bladder. Other: There is a fairly mild degree of ascites. IMPRESSION: Normal appearing kidneys bilaterally. No  bladder lesions evident by ultrasound. Ascites present. Electronically Signed   By: Lowella Grip III M.D.   On: 01/25/2021 13:07   DG Chest Portable 1 View  Result Date: 01/24/2021 CLINICAL DATA:  Status post fall. EXAM: PORTABLE CHEST 1 VIEW COMPARISON:  January 14, 2021 FINDINGS: Decreased lung volumes are seen which is likely secondary to the degree of patient inspiration. Chronic appearing increased interstitial lung markings are seen. Ill-defined, patchy areas of atelectasis and/or infiltrate are seen within the lateral aspect of the right lung base and bilateral apices. This represents a new finding when compared to the prior study. There is stable elevation of the right hemidiaphragm. There is no evidence of a pleural effusion or pneumothorax. Moderate to marked severity cardiomegaly is noted. Marked severity calcification of the aortic arch is seen. The visualized skeletal structures are unremarkable. IMPRESSION: Stable cardiomegaly with interval development of mild biapical and right basilar atelectasis and/or infiltrate since the prior study. Electronically Signed   By: Virgina Norfolk M.D.   On: 01/24/2021 16:25   DG Chest Port 1 View  Result Date: 01/08/2021 CLINICAL DATA:  Evaluate for pleural effusion. EXAM: PORTABLE CHEST 1 VIEW COMPARISON:  01/05/2021. FINDINGS: Aortic atherosclerosis. Interval removal of right pleural drain. No pneumothorax identified. Diffuse, bilateral reticular and nodular pulmonary opacities are unchanged when compared with 01/05/2021. IMPRESSION: 1. No pneumothorax status post removal of right pleural drain. 2. Persistent interstitial and airspace opacities, unchanged. Electronically Signed   By: Kerby Moors M.D.   On: 01/08/2021 19:34   DG Chest Port 1 View  Result Date: 01/05/2021 CLINICAL DATA:  65 year old female with shortness of breath EXAM: PORTABLE CHEST 1 VIEW COMPARISON:  Multiple prior most recent 01/03/2021 FINDINGS: Cardiomediastinal silhouette  unchanged. Reticular opacities of the right greater than left lungs. No pneumothorax. Pleural drain again demonstrated on the right, unchanged in position. No displaced fracture. IMPRESSION: Similar appearance of the chest x-ray with right greater than left mixed interstitial and airspace disease. Unchanged right pleural drain with no pneumothorax or pleural effusion. Electronically Signed   By: Corrie Mckusick D.O.   On: 01/05/2021 09:31   DG CHEST PORT 1 VIEW  Result Date: 01/03/2021 CLINICAL DATA:  Pleural effusion EXAM: PORTABLE CHEST 1 VIEW COMPARISON:  01/01/2021 FINDINGS: Right PleurX catheter remains in place, unchanged. No visible effusion. Diffuse airspace disease throughout the lungs, right greater than left, slightly worsened on the right since prior study. Heart is upper limits normal in size. No pneumothorax. Aortic atherosclerosis. IMPRESSION: Bilateral airspace disease, right greater than left, slightly worsening since prior study. Right PleurX catheter.  No visible effusion or pneumothorax. Electronically Signed   By: Rolm Baptise M.D.   On: 01/03/2021 07:20   DG CHEST PORT 1 VIEW  Result Date: 01/01/2021 CLINICAL DATA:  Shortness of breath. EXAM: PORTABLE CHEST 1 VIEW COMPARISON:  12/31/2020.  12/21/2020. FINDINGS: Right chest tube in stable position. No pneumothorax. Heart size stable. Low lung volumes. Diffuse bilateral pulmonary infiltrates/edema again noted without interim change. No pleural effusion. IMPRESSION: 1. Right chest tube in stable position. No pneumothorax. 2. Low lung  volumes. Diffuse bilateral pulmonary infiltrates/edema again noted without interim change. Electronically Signed   By: Marcello Moores  Register   On: 01/01/2021 06:04   DG CHEST PORT 1 VIEW  Result Date: 12/31/2020 CLINICAL DATA:  Inpatient, shortness of breath. EXAM: PORTABLE CHEST 1 VIEW COMPARISON:  12/21/2020 FINDINGS: Right chest tube overlies the lower hemithorax. The heart size and mediastinal contours are  unchanged. Hilar vasculature prominence. Aortic arch calcification. No focal consolidation. Interval increase in interstitial markings that are more prominent within the upper lobes. Trace right pleural effusion. No left pleural effusion. Previously identified right apical pneumothorax not visualized. No left neumothorax. No acute osseous abnormality. IMPRESSION: 1. Pulmonary edema. 2. Trace right pleural effusion. 3. Interval resolution of right apical pneumothorax. Electronically Signed   By: Iven Finn M.D.   On: 12/31/2020 05:32     Microbiology: Recent Results (from the past 240 hour(s))  Resp Panel by RT-PCR (Flu A&B, Covid) Nasopharyngeal Swab     Status: None   Collection Time: 01/24/21  5:30 PM   Specimen: Nasopharyngeal Swab; Nasopharyngeal(NP) swabs in vial transport medium  Result Value Ref Range Status   SARS Coronavirus 2 by RT PCR NEGATIVE NEGATIVE Final    Comment: (NOTE) SARS-CoV-2 target nucleic acids are NOT DETECTED.  The SARS-CoV-2 RNA is generally detectable in upper respiratory specimens during the acute phase of infection. The lowest concentration of SARS-CoV-2 viral copies this assay can detect is 138 copies/mL. A negative result does not preclude SARS-Cov-2 infection and should not be used as the sole basis for treatment or other patient management decisions. A negative result may occur with  improper specimen collection/handling, submission of specimen other than nasopharyngeal swab, presence of viral mutation(s) within the areas targeted by this assay, and inadequate number of viral copies(<138 copies/mL). A negative result must be combined with clinical observations, patient history, and epidemiological information. The expected result is Negative.  Fact Sheet for Patients:  EntrepreneurPulse.com.au  Fact Sheet for Healthcare Providers:  IncredibleEmployment.be  This test is no t yet approved or cleared by the  [place] FDA and  has been authorized for detection and/or diagnosis of SARS-CoV-2 by FDA under an Emergency Use Authorization (EUA). This EUA will remain  in effect (meaning this test can be used) for the duration of the COVID-19 declaration under Section 564(b)(1) of the Act, 21 U.S.C.section 360bbb-3(b)(1), unless the authorization is terminated  or revoked sooner.       Influenza A by PCR NEGATIVE NEGATIVE Final   Influenza B by PCR NEGATIVE NEGATIVE Final    Comment: (NOTE) The Xpert Xpress SARS-CoV-2/FLU/RSV plus assay is intended as an aid in the diagnosis of influenza from Nasopharyngeal swab specimens and should not be used as a sole basis for treatment. Nasal washings and aspirates are unacceptable for Xpert Xpress SARS-CoV-2/FLU/RSV testing.  Fact Sheet for Patients: EntrepreneurPulse.com.au  Fact Sheet for Healthcare Providers: IncredibleEmployment.be  This test is not yet approved or cleared by the [place] FDA and has been authorized for detection and/or diagnosis of SARS-CoV-2 by FDA under an Emergency Use Authorization (EUA). This EUA will remain in effect (meaning this test can be used) for the duration of the COVID-19 declaration under Section 564(b)(1) of the Act, 21 U.S.C. section 360bbb-3(b)(1), unless the authorization is terminated or revoked.  Performed at [facility], [address] [place], [place] [address]   MRSA PCR Screening     Status: None   Collection Time: 01/25/21  9:59 AM   Specimen: Nasopharyngeal  Result  Value Ref Range Status   MRSA by PCR NEGATIVE NEGATIVE Final    Comment:        The GeneXpert MRSA Assay (FDA approved for NASAL specimens only), is one component of a comprehensive MRSA colonization surveillance program. It is not intended to diagnose MRSA infection nor to guide or monitor treatment for MRSA infections. Performed at [facility], [address] [place], [place]  [address]   Culture, blood (Routine X 2) w Reflex to ID Panel     Status: None (Preliminary result)   Collection Time: 01/25/21 10:15 AM   Specimen: BLOOD RIGHT ARM  Result Value Ref Range Status   Specimen Description   Final    BLOOD RIGHT ARM BOTTLES DRAWN AEROBIC AND ANAEROBIC   Special Requests   Final    Blood Culture adequate volume Performed at [facility], [address] [place], [place] [address]    Culture PENDING  Incomplete   Report Status PENDING  Incomplete  Culture, blood (Routine X 2) w Reflex to ID Panel     Status: None (Preliminary result)   Collection Time: 01/25/21 10:15 AM   Specimen: BLOOD LEFT HAND  Result Value Ref Range Status   Specimen Description   Final    BLOOD LEFT HAND BOTTLES DRAWN AEROBIC AND ANAEROBIC   Special Requests   Final    Blood Culture adequate volume Performed at [facility], [address] [place], [place] [address]    Culture PENDING  Incomplete   Report Status PENDING  Incomplete     Labs: Basic Metabolic Panel: Recent Labs  Lab 01/24/21 1616 01/25/21 0508  NA 131* 136  K 3.5 3.7  CL 100 106  CO2 16* 17*  GLUCOSE 132* 106*  BUN 59* 59*  CREATININE 4.14* 4.12*  CALCIUM 8.8* 8.9   Liver Function Tests: Recent Labs  Lab 01/24/21 1616 01/25/21 0508  AST 284* 267*  ALT 108* 95*  ALKPHOS 172* 148*  BILITOT 9.1* 8.4*  PROT 6.6 6.0*  ALBUMIN 2.8* 2.7*   No results for input(s): LIPASE, AMYLASE in the last 168 hours. Recent Labs  Lab 01/24/21 1616 01/25/21 0508  AMMONIA 74* 109*   CBC: Recent Labs  Lab 01/24/21 1616 01/25/21 0508  WBC 15.7* 15.2*  NEUTROABS 13.2*  --   HGB 10.1* 9.3*  HCT 28.2* 26.3*  MCV 87.0 87.4  PLT 169 149*   Cardiac Enzymes: No results for input(s): CKTOTAL, CKMB, CKMBINDEX, TROPONINI in the last 168 hours. BNP: Invalid input(s): POCBNP CBG: Recent Labs  Lab 01/24/21 1732  GLUCAP 119*    Time coordinating discharge:  36 minutes  Signed:  Orson Eva, DO [facility] Pager: [phone] 01/25/2021, 2:10 PM

## 2021-01-25 NOTE — Progress Notes (Addendum)
Report called to [facility]. [facility] called to transport patient, [facility] transport called and informed that patient was coming by [facility].

## 2021-01-25 NOTE — Progress Notes (Signed)
Patient was given 0.5 mg of Ativan in ED.  When attempting to give her medications on the floor patient was to sedated from the Ativan to wake up enough to take her sodium Bi carb tablet and her rifaimi tablet.  MD notified of patient note taking medication. With no further orders givne.

## 2021-01-25 NOTE — Progress Notes (Signed)
PROGRESS NOTE  Lori Cooper [id number] DOB: 04-06-1956 DOA: 01/24/2021 PCP: Sharion Balloon, FNP  Brief History:  65 year old female with a history of NASH Cirrhosis, PE 01/18/2018, GERD, anxiety, hyperlipidemia presenting on 01/24/2021 secondary to sliding out of her chair.  Although the initial chief complaint was that of the patient having a "fall", it appears that by the daughter's description the patient actually slid out of her recliner chair and hit her head against the side of the bed rail.  According to the patient's daughter, there was no loss of consciousness.  At the time of my evaluation, the patient is somnolent and is unable to provide any history.  History is obtained from review of the medical record and speaking with the patient's daughter at the bedside.  According to the patient's daughter, the patient has had increasing confusion over the past week.  She endorsed that the patient had been taking her lactulose having about 2 bowel movements on a daily basis.  There was no report of fevers, chills, chest pain, shortness of breath, coughing, hematochezia, melena.  Daughter stated that the patient's only new medication was Zofran.  Apparently, the patient had episodes of nausea and vomiting approximately 2 weeks ago which have been relieved with Zofran.  The daughter stated that the patient has not had any further emesis.  There is no complaints of dysuria or hematuria.  Because of the patient's confusion and concerns for the "fall", the patient was brought to the emergency department for further evaluation.  Evaluation in the ED showed that the patient had ammonia of 74 with serum creatinine of 4.14 which is above her usual baseline of 1.3-1.6.  WBC was 15.7 with hemoglobin 10.1 and platelets 169,000.  Chest x-ray showed basal opacities, regular in the left thought to be atelectasis versus infiltrate.  At the time of admission, AST 284, ALT 108, alkaline phosphatase 172, total  bilirubin 9.1.  CT of the brain was negative for any acute findings.  Remainder her BMP showed sodium 131, potassium 3.5, chloride 108, CO2 16.  COVID-19 PCR was negative.  EKG shows sinus rhythm with nonspecific T wave changes.  The patient was given a lactulose enema.  The patient was started on IV fluids.  On the morning of 01/25/2021, the patient remained somnolent with ammonia up to 109.  Repeat lactulose enema was ordered.  The patient's daughter at the bedside requested transfer to [facility].  Notably, the patient had a recent prolonged hospitalization at [facility] from 12/09/2020 to 01/10/2021.  During that hospitalization, liver biopsy on 12/23/2020 showed moderate to severely active steatohepatitis with cirrhosis.  She was initially transferred secondary to a large right pleural effusion with concerns of a right hemidiaphragm defect.  Cardiothoracic surgery was consulted and continue to follow the patient.  She underwent thoracocentesis initially on 12/10/2020 which showed a transudate of fluid.  Cardiothoracic surgery did not feel that the patient's diaphragmatic defect was not appreciable to the point of warranting a thoracotomy.  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  A Pleurx catheter was initially inserted on 12/05/2020.  It was dislodged on 01/06/2021.  After numerous discussion with the patient's daughter, they did not want the Pleurx catheter at that time to be reinserted and the patient was discharged home in stable condition as her right pleural fluid did not significantly reaccumulate.  She ultimately followed up in the office with cardiothoracic  surgery, Dr. Servando Snare on 01/16/21.  Chest x-ray at that time did not show any significant reaccumulation of her right pleural fluid, therefore no further invasive procedures were indicated.  Apparently, the patient has seen her usual gastroenterologist, Dr. Laural Golden, since discharge from the hospital, and he referred her  to [facility] for possible liver transplant evaluation.   Assessment/Plan: Acute metabolic encephalopathy -Multifactorial including acute on chronic renal failure and hepatic encephalopathy and possible infectious process -Remains somnolent -obtain UA and urine culture -continue lactulose enemas -personally reviewed EKG--sinus, nonspecific T-wave changes  Hepatic encephalopathy -Continue lactulose enemas until the patient is more alert to take p.o. -01/25/2021 ammonia 74>>109 -Restart rifaximin once the patient is awake enough to take p.o.  Acute on chronic renal failure--CKD stage IIIb -Baseline creatinine 1.3-1.6 -likely hemodynamically/volume mediated -Concern about hepatorenal syndrome -Nephrology consult-spoke with Dr. Joylene Grapes -Renal ultrasound -Obtain urinalysis -start albumin IV -continue IVF  Pulmonary infiltrates -Start empiric zosyn -Check procalcitonin -Concerned about pneumonia, possibly aspiration  Decompensated NASH Liver Cirrhosis -continue lactulose as discussed -GI consulted--Dr. Cherlynn June -I have called [facility] to request transfer--await call back to discuss  Hyponatremia -Multifactorial including volume depletion and Liver cirrhosis -stable, improving with IV NS  History pulmonary embolus -diagnosed March 2019 -had been on apixaban up until d/c from hospital on 01/10/21       Status is: Inpatient  Remains inpatient appropriate because:Altered mental status   Dispo: The patient is from: Home              Anticipated d/c is to: Home              Patient currently is not medically stable to d/c.   Difficult to place patient No         Family Communication:   Daughter updated at bedside 01/25/21  Consultants:  Renal, GI  Code Status:  FULL   DVT Prophylaxis:  SCDs   Procedures: As Listed in Progress Note Above  Antibiotics: Zosyn 3/12>>>       Subjective: Patient is somnolent.  Arouses only to  protopathic stimuli.  No vomiting.  No respiratory distress.  Objective: Vitals:   01/24/21 2155 01/25/21 0202 01/25/21 0432 01/25/21 0801  BP:  (!) 110/58 (!) 93/47   Pulse:  88 93   Resp:  18 18   Temp:  (!) 97.3 F (36.3 C) 98.1 F (36.7 C)   TempSrc:  Oral Oral   SpO2: 98% 98% 95% 93%  Weight:      Height:        Intake/Output Summary (Last 24 hours) at 01/25/2021 0935 Last data filed at 01/25/2021 0700 Gross per 24 hour  Intake 1000 ml  Output 800 ml  Net 200 ml   Weight change:  Exam:   General:  Pt is somnolent, does not follow commands appropriately, not in acute distress  HEENT: No icterus, No thrush, No neck mass, [place]/AT  Cardiovascular: RRR, S1/S2, no rubs, no gallops  Respiratory: bibasilar rales.  Diminished BS right base  Abdomen: Soft/+BS, non tender, non distended, no guarding  Extremities: No edema, No lymphangitis, No petechiae, No rashes, no synovitis   Data Reviewed: I have personally reviewed following labs and imaging studies Basic Metabolic Panel: Recent Labs  Lab 01/24/21 1616 01/25/21 0508  NA 131* 136  K 3.5 3.7  CL 100 106  CO2 16* 17*  GLUCOSE 132* 106*  BUN 59* 59*  CREATININE 4.14* 4.12*  CALCIUM 8.8* 8.9   Liver Function Tests: Recent  Labs  Lab 01/24/21 1616 01/25/21 0508  AST 284* 267*  ALT 108* 95*  ALKPHOS 172* 148*  BILITOT 9.1* 8.4*  PROT 6.6 6.0*  ALBUMIN 2.8* 2.7*   No results for input(s): LIPASE, AMYLASE in the last 168 hours. Recent Labs  Lab 01/24/21 1616 01/25/21 0508  AMMONIA 74* 109*   Coagulation Profile: Recent Labs  Lab 01/23/21 0000 01/24/21 1616 01/25/21 0508  INR 2.0* 2.2* 2.4*   CBC: Recent Labs  Lab 01/24/21 1616 01/25/21 0508  WBC 15.7* 15.2*  NEUTROABS 13.2*  --   HGB 10.1* 9.3*  HCT 28.2* 26.3*  MCV 87.0 87.4  PLT 169 149*   Cardiac Enzymes: No results for input(s): CKTOTAL, CKMB, CKMBINDEX, TROPONINI in the last 168 hours. BNP: Invalid input(s): POCBNP CBG: Recent  Labs  Lab 01/24/21 1732  GLUCAP 119*   HbA1C: No results for input(s): HGBA1C in the last 72 hours. Urine analysis:    Component Value Date/Time   COLORURINE AMBER (A) 12/22/2020 1313   APPEARANCEUR HAZY (A) 12/22/2020 1313   APPEARANCEUR Clear 03/23/2017 1006   LABSPEC 1.016 12/22/2020 1313   PHURINE 5.0 12/22/2020 1313   GLUCOSEU NEGATIVE 12/22/2020 1313   HGBUR NEGATIVE 12/22/2020 1313   BILIRUBINUR NEGATIVE 12/22/2020 1313   BILIRUBINUR Negative 03/23/2017 [place] 12/22/2020 1313   PROTEINUR NEGATIVE 12/22/2020 1313   NITRITE NEGATIVE 12/22/2020 1313   LEUKOCYTESUR MODERATE (A) 12/22/2020 1313   Sepsis Labs: '@LABRCNTIP'$ (procalcitonin:4,lacticidven:4) ) Recent Results (from the past 240 hour(s))  Resp Panel by RT-PCR (Flu A&B, Covid) Nasopharyngeal Swab     Status: None   Collection Time: 01/24/21  5:30 PM   Specimen: Nasopharyngeal Swab; Nasopharyngeal(NP) swabs in vial transport medium  Result Value Ref Range Status   SARS Coronavirus 2 by RT PCR NEGATIVE NEGATIVE Final    Comment: (NOTE) SARS-CoV-2 target nucleic acids are NOT DETECTED.  The SARS-CoV-2 RNA is generally detectable in upper respiratory specimens during the acute phase of infection. The lowest concentration of SARS-CoV-2 viral copies this assay can detect is 138 copies/mL. A negative result does not preclude SARS-Cov-2 infection and should not be used as the sole basis for treatment or other patient management decisions. A negative result may occur with  improper specimen collection/handling, submission of specimen other than nasopharyngeal swab, presence of viral mutation(s) within the areas targeted by this assay, and inadequate number of viral copies(<138 copies/mL). A negative result must be combined with clinical observations, patient history, and epidemiological information. The expected result is Negative.  Fact Sheet for Patients:   EntrepreneurPulse.com.au  Fact Sheet for Healthcare Providers:  IncredibleEmployment.be  This test is no t yet approved or cleared by the [place] FDA and  has been authorized for detection and/or diagnosis of SARS-CoV-2 by FDA under an Emergency Use Authorization (EUA). This EUA will remain  in effect (meaning this test can be used) for the duration of the COVID-19 declaration under Section 564(b)(1) of the Act, 21 U.S.C.section 360bbb-3(b)(1), unless the authorization is terminated  or revoked sooner.       Influenza A by PCR NEGATIVE NEGATIVE Final   Influenza B by PCR NEGATIVE NEGATIVE Final    Comment: (NOTE) The Xpert Xpress SARS-CoV-2/FLU/RSV plus assay is intended as an aid in the diagnosis of influenza from Nasopharyngeal swab specimens and should not be used as a sole basis for treatment. Nasal washings and aspirates are unacceptable for Xpert Xpress SARS-CoV-2/FLU/RSV testing.  Fact Sheet for Patients: EntrepreneurPulse.com.au  Fact Sheet for Healthcare  Providers: IncredibleEmployment.be  This test is not yet approved or cleared by the [place] and has been authorized for detection and/or diagnosis of SARS-CoV-2 by FDA under an Emergency Use Authorization (EUA). This EUA will remain in effect (meaning this test can be used) for the duration of the COVID-19 declaration under Section 564(b)(1) of the Act, 21 U.S.C. section 360bbb-3(b)(1), unless the authorization is terminated or revoked.  Performed at [facility], [address] [place], [place] [address]      Scheduled Meds: . buPROPion  150 mg Oral q morning  . Chlorhexidine Gluconate Cloth  6 each Topical Daily  . feeding supplement  237 mL Oral BID BM  . FLUoxetine  40 mg Oral Daily  . fluticasone furoate-vilanterol  1 puff Inhalation Daily  . folic acid  1 mg Intravenous Daily  . lactulose  20 g Oral Once  .  midodrine  15 mg Oral TID WC  . rifaximin  550 mg Oral BID  . sodium bicarbonate  650 mg Oral TID  . thiamine injection  100 mg Intravenous Daily   Continuous Infusions:  Procedures/Studies: DG Chest 1 View  Result Date: 01/09/2021 CLINICAL DATA:  Prior chest tube removal. EXAM: CHEST  1 VIEW COMPARISON:  01/08/2021. FINDINGS: Heart size stable. Persistent bilateral interstitial prominence, no change from prior exam. No pleural effusion or pneumothorax. Surgical clips right upper quadrant. IMPRESSION: 1. Persistent bilateral interstitial prominence, no change from prior exam. 2.  No evidence of pneumothorax following recent chest tube removal. Electronically Signed   By: [place]   On: 01/09/2021 06:26   DG Chest 2 View  Result Date: 01/14/2021 CLINICAL DATA:  Pleural effusion EXAM: CHEST - 2 VIEW COMPARISON:  01/09/2021, CT 12/09/2020 FINDINGS: Mild cardiomegaly. Subsegmental atelectasis in the right upper lung. Improved aeration. Elevated right diaphragm versus residual sub pulmonic effusion. No pneumothorax. IMPRESSION: Improved aeration. Elevated right diaphragm versus residual sub pulmonic effusion. Electronically Signed   By: Donavan Foil M.D.   On: 01/14/2021 16:33   CT Head Wo Contrast  Result Date: 01/24/2021 CLINICAL DATA:  Status post fall. EXAM: CT HEAD WITHOUT CONTRAST TECHNIQUE: Contiguous axial images were obtained from the base of the skull through the vertex without intravenous contrast. COMPARISON:  None. FINDINGS: Brain: There is very mild cerebral atrophy with widening of the extra-axial spaces and ventricular dilatation. There are areas of very mildly decreased attenuation within the white matter tracts of the supratentorial brain, consistent with early microvascular disease changes. A small chronic right basal ganglia lacunar infarct is seen. Vascular: No hyperdense vessel or unexpected calcification. Skull: Normal. Negative for fracture or focal lesion.  Sinuses/Orbits: No acute finding. Other: None. IMPRESSION: 1. Very mild cerebral atrophy and early microvascular disease changes of the supratentorial brain. 2. Small chronic right basal ganglia lacunar infarct. 3. No acute intracranial abnormality. Electronically Signed   By: Virgina Norfolk M.D.   On: 01/24/2021 16:09   DG Chest Portable 1 View  Result Date: 01/24/2021 CLINICAL DATA:  Status post fall. EXAM: PORTABLE CHEST 1 VIEW COMPARISON:  January 14, 2021 FINDINGS: Decreased lung volumes are seen which is likely secondary to the degree of patient inspiration. Chronic appearing increased interstitial lung markings are seen. Ill-defined, patchy areas of atelectasis and/or infiltrate are seen within the lateral aspect of the right lung base and bilateral apices. This represents a new finding when compared to the prior study. There is stable elevation of the right hemidiaphragm. There is no evidence of a pleural  effusion or pneumothorax. Moderate to marked severity cardiomegaly is noted. Marked severity calcification of the aortic arch is seen. The visualized skeletal structures are unremarkable. IMPRESSION: Stable cardiomegaly with interval development of mild biapical and right basilar atelectasis and/or infiltrate since the prior study. Electronically Signed   By: Virgina Norfolk M.D.   On: 01/24/2021 16:25   DG Chest Port 1 View  Result Date: 01/08/2021 CLINICAL DATA:  Evaluate for pleural effusion. EXAM: PORTABLE CHEST 1 VIEW COMPARISON:  01/05/2021. FINDINGS: Aortic atherosclerosis. Interval removal of right pleural drain. No pneumothorax identified. Diffuse, bilateral reticular and nodular pulmonary opacities are unchanged when compared with 01/05/2021. IMPRESSION: 1. No pneumothorax status post removal of right pleural drain. 2. Persistent interstitial and airspace opacities, unchanged. Electronically Signed   By: Kerby Moors M.D.   On: 01/08/2021 19:34   DG Chest Port 1 View  Result Date:  01/05/2021 CLINICAL DATA:  65 year old female with shortness of breath EXAM: PORTABLE CHEST 1 VIEW COMPARISON:  Multiple prior most recent 01/03/2021 FINDINGS: Cardiomediastinal silhouette unchanged. Reticular opacities of the right greater than left lungs. No pneumothorax. Pleural drain again demonstrated on the right, unchanged in position. No displaced fracture. IMPRESSION: Similar appearance of the chest x-ray with right greater than left mixed interstitial and airspace disease. Unchanged right pleural drain with no pneumothorax or pleural effusion. Electronically Signed   By: Corrie Mckusick D.O.   On: 01/05/2021 09:31   DG CHEST PORT 1 VIEW  Result Date: 01/03/2021 CLINICAL DATA:  Pleural effusion EXAM: PORTABLE CHEST 1 VIEW COMPARISON:  01/01/2021 FINDINGS: Right PleurX catheter remains in place, unchanged. No visible effusion. Diffuse airspace disease throughout the lungs, right greater than left, slightly worsened on the right since prior study. Heart is upper limits normal in size. No pneumothorax. Aortic atherosclerosis. IMPRESSION: Bilateral airspace disease, right greater than left, slightly worsening since prior study. Right PleurX catheter.  No visible effusion or pneumothorax. Electronically Signed   By: Rolm Baptise M.D.   On: 01/03/2021 07:20   DG CHEST PORT 1 VIEW  Result Date: 01/01/2021 CLINICAL DATA:  Shortness of breath. EXAM: PORTABLE CHEST 1 VIEW COMPARISON:  12/31/2020.  12/21/2020. FINDINGS: Right chest tube in stable position. No pneumothorax. Heart size stable. Low lung volumes. Diffuse bilateral pulmonary infiltrates/edema again noted without interim change. No pleural effusion. IMPRESSION: 1. Right chest tube in stable position. No pneumothorax. 2. Low lung volumes. Diffuse bilateral pulmonary infiltrates/edema again noted without interim change. Electronically Signed   By: Marcello Moores  Register   On: 01/01/2021 06:04   DG CHEST PORT 1 VIEW  Result Date: 12/31/2020 CLINICAL DATA:   Inpatient, shortness of breath. EXAM: PORTABLE CHEST 1 VIEW COMPARISON:  12/21/2020 FINDINGS: Right chest tube overlies the lower hemithorax. The heart size and mediastinal contours are unchanged. Hilar vasculature prominence. Aortic arch calcification. No focal consolidation. Interval increase in interstitial markings that are more prominent within the upper lobes. Trace right pleural effusion. No left pleural effusion. Previously identified right apical pneumothorax not visualized. No left neumothorax. No acute osseous abnormality. IMPRESSION: 1. Pulmonary edema. 2. Trace right pleural effusion. 3. Interval resolution of right apical pneumothorax. Electronically Signed   By: Iven Finn M.D.   On: 12/31/2020 05:32    Orson Eva, DO  [facility]  If 7PM-7AM, please contact night-coverage [email or web address] Password [email or web address] 01/25/2021, 9:35 AM   LOS: 1 day

## 2021-01-25 NOTE — Progress Notes (Deleted)
   01/25/21 1544  Section 1: Provider Certification  Patient Condition Patient stabilized  Reason for Transfer definitive care for hepatorenal syndrome; hepatic failure/decompensated liver cirrhosis  Benefits of Transfer specialists available  Risks of Transfer delay in care;  Level of Care ALS  Accepting Physician Dr. Wynonia Musty  Sending Physician Dr. Carles Collet and Dr. Cherlynn June  Physician Assessment Patient examined and risks and benefits explained  Section 2: Clinician Certification  Accepting hospital or facility Available service confirmed;Available space confirmed;Available personnel confirmed  Accepting Facility [facility]  Transport By Ambulance (Comment) (Therapist, art. User may not have seen previous data.)  Date transferred 01/25/21  Section 4: Provider Reassessment (within 1 hour of departure)  Physician Reassessment Reassessment completed prior to transfer (Provider Only)

## 2021-01-25 NOTE — Progress Notes (Signed)
Received call back from [facility] with physician and presented case. Transfer was denied at this time I called patient's daughter and updated her on the status of her mother.  Answered all questions to daughter's satisfaction.  Orson Eva, DO

## 2021-01-27 LAB — URINE DRUGS OF ABUSE SCREEN W ALC, ROUTINE (REF LAB)
Amphetamines, Urine: NEGATIVE ng/mL
Barbiturate, Ur: NEGATIVE ng/mL
Benzodiazepine Quant, Ur: NEGATIVE ng/mL
Cannabinoid Quant, Ur: NEGATIVE ng/mL
Cocaine (Metab.): NEGATIVE ng/mL
Ethanol U, Quan: NEGATIVE %
Methadone Screen, Urine: NEGATIVE ng/mL
Opiate Quant, Ur: NEGATIVE ng/mL
Phencyclidine, Ur: NEGATIVE ng/mL
Propoxyphene, Urine: NEGATIVE ng/mL

## 2021-01-27 LAB — URINE CULTURE

## 2021-01-27 LAB — HCV RNA QUANT RFLX ULTRA OR GENOTYP
HCV RNA Qnt(log copy/mL): UNDETERMINED log10 IU/mL
HepC Qn: NOT DETECTED IU/mL

## 2021-01-27 LAB — EPSTEIN-BARR VIRUS VCA, IGM: EBV VCA IgM: <36 U/mL (ref 0.0–35.9)

## 2021-01-27 LAB — HSV(HERPES SIMPLEX VRS) I + II AB-IGM: HSVI/II Comb IgM: <0.91 Ratio (ref 0.00–0.90)

## 2021-01-28 LAB — CMV IGM: CMV IgM: <30 [AU]/ml (ref 0.0–29.9)

## 2021-01-29 LAB — HEPATITIS B DNA, ULTRAQUANTITATIVE, PCR
HBV DNA SERPL PCR-ACNC: NOT DETECTED IU/mL
HBV DNA SERPL PCR-LOG IU: UNDETERMINED log10 IU/mL

## 2021-01-29 LAB — EPSTEIN BARR VRS(EBV DNA BY PCR): EBV DNA QN by PCR: NEGATIVE IU/mL

## 2021-01-29 LAB — CMV DNA BY PCR, QUALITATIVE: CMV DNA, Qual PCR: NEGATIVE

## 2021-01-29 NOTE — Chronic Care Management (AMB) (Signed)
Erroneous encounter

## 2021-01-30 LAB — CULTURE, BLOOD (ROUTINE X 2)
Culture: NO GROWTH
Culture: NO GROWTH
Special Requests: ADEQUATE
Special Requests: ADEQUATE

## 2021-02-04 ENCOUNTER — Telehealth: Payer: PPO

## 2021-02-06 DIAGNOSIS — S2239XG Fracture of one rib, unspecified side, subsequent encounter for fracture with delayed healing: Secondary | ICD-10-CM | POA: Diagnosis not present

## 2021-02-06 DIAGNOSIS — J90 Pleural effusion, not elsewhere classified: Secondary | ICD-10-CM | POA: Diagnosis not present

## 2021-02-06 DIAGNOSIS — K7460 Unspecified cirrhosis of liver: Secondary | ICD-10-CM | POA: Diagnosis not present

## 2021-02-06 DIAGNOSIS — R1319 Other dysphagia: Secondary | ICD-10-CM | POA: Diagnosis not present

## 2021-02-06 DIAGNOSIS — S31109A Unspecified open wound of abdominal wall, unspecified quadrant without penetration into peritoneal cavity, initial encounter: Secondary | ICD-10-CM | POA: Diagnosis not present

## 2021-02-14 DEATH — deceased

## 2021-02-20 ENCOUNTER — Ambulatory Visit (INDEPENDENT_AMBULATORY_CARE_PROVIDER_SITE_OTHER): Payer: PPO | Admitting: Internal Medicine

## 2021-03-11 ENCOUNTER — Ambulatory Visit: Payer: PPO | Admitting: Gastroenterology

## 2022-05-01 IMAGING — CR DG CHEST 2V
2 series · 2 of 2 positions shown · non-contrast
Comparison: Chest radiograph dated 12/10/2020.

CLINICAL DATA: 65-year-old female with hypoxia.

EXAM:
CHEST - 2 VIEW

[chest lat]
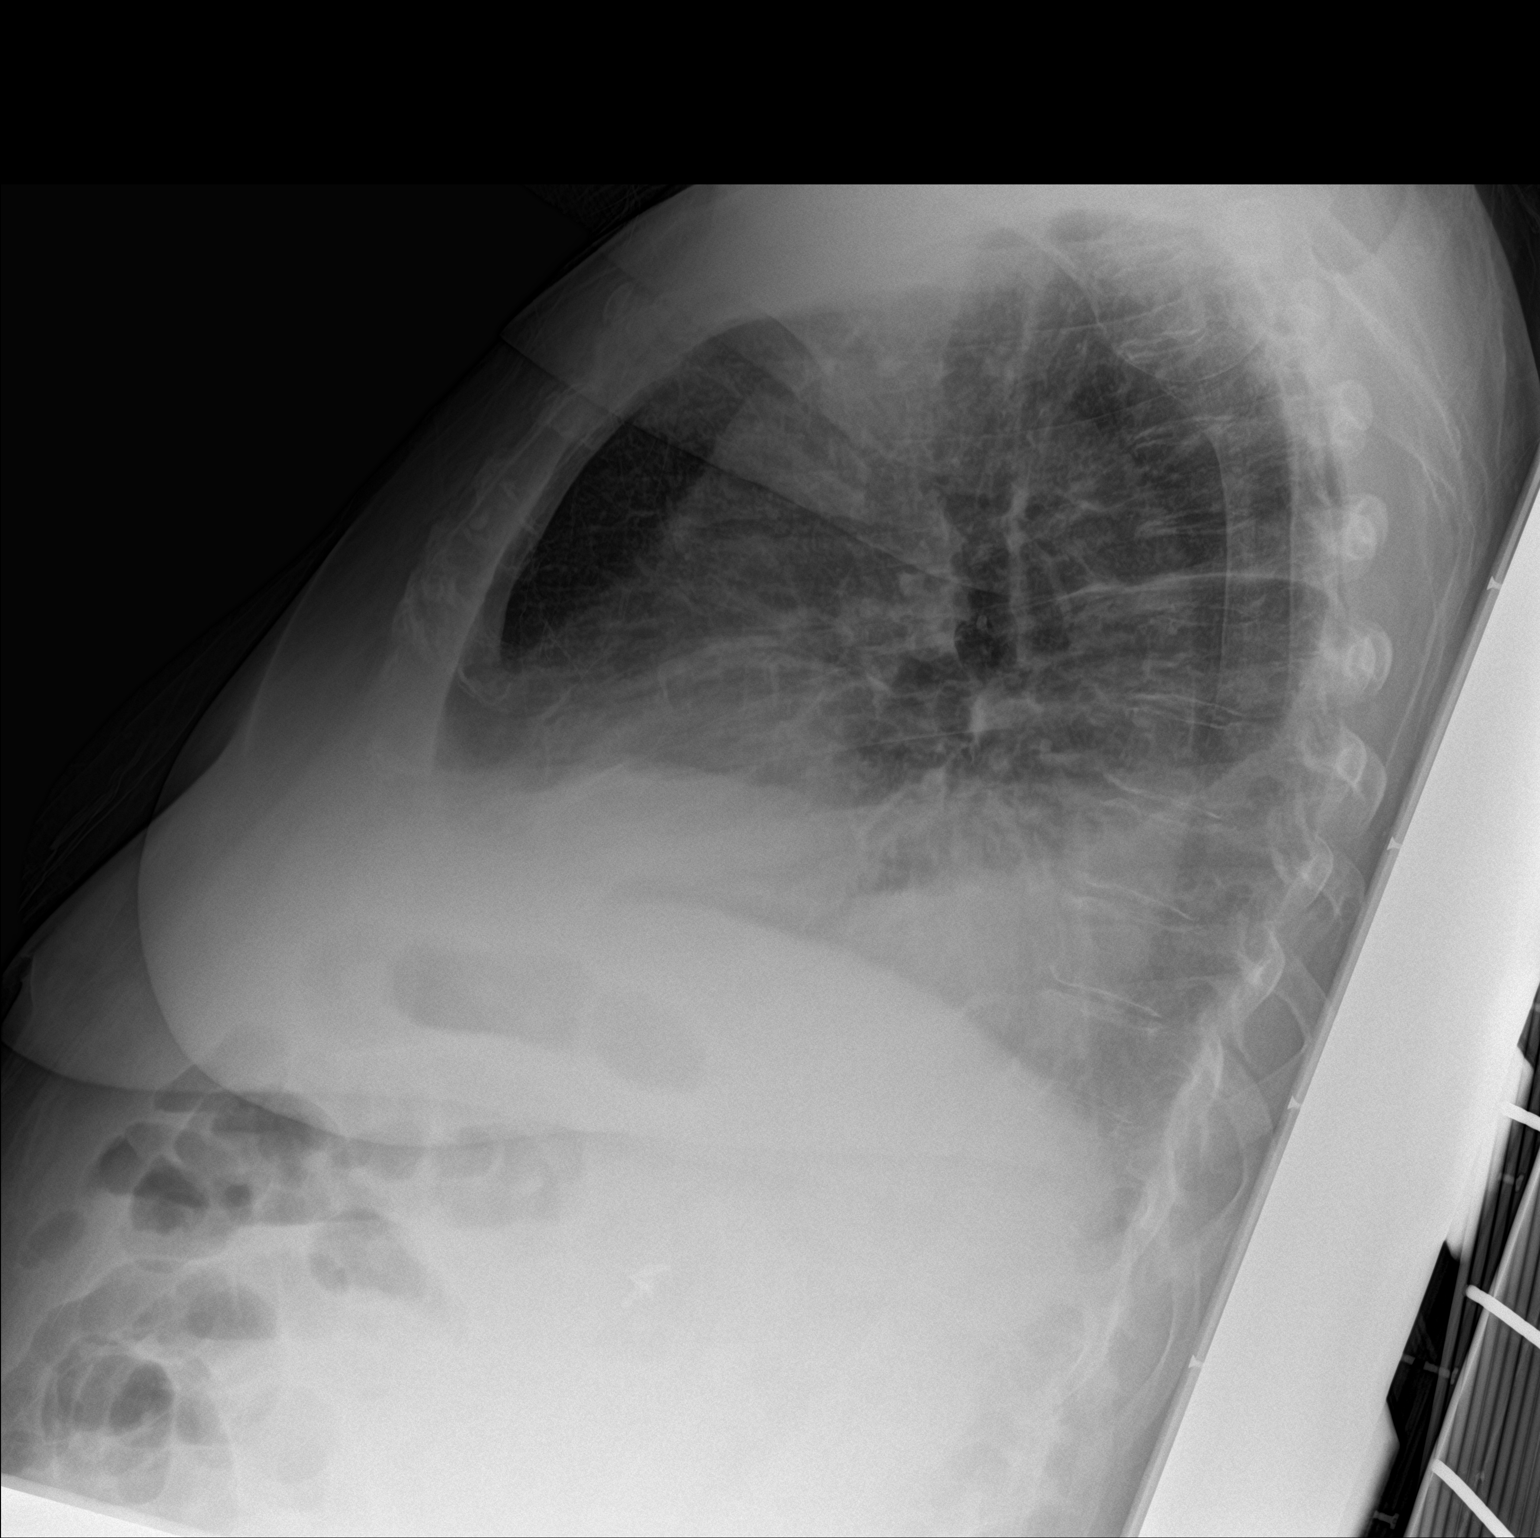

[chest ap]
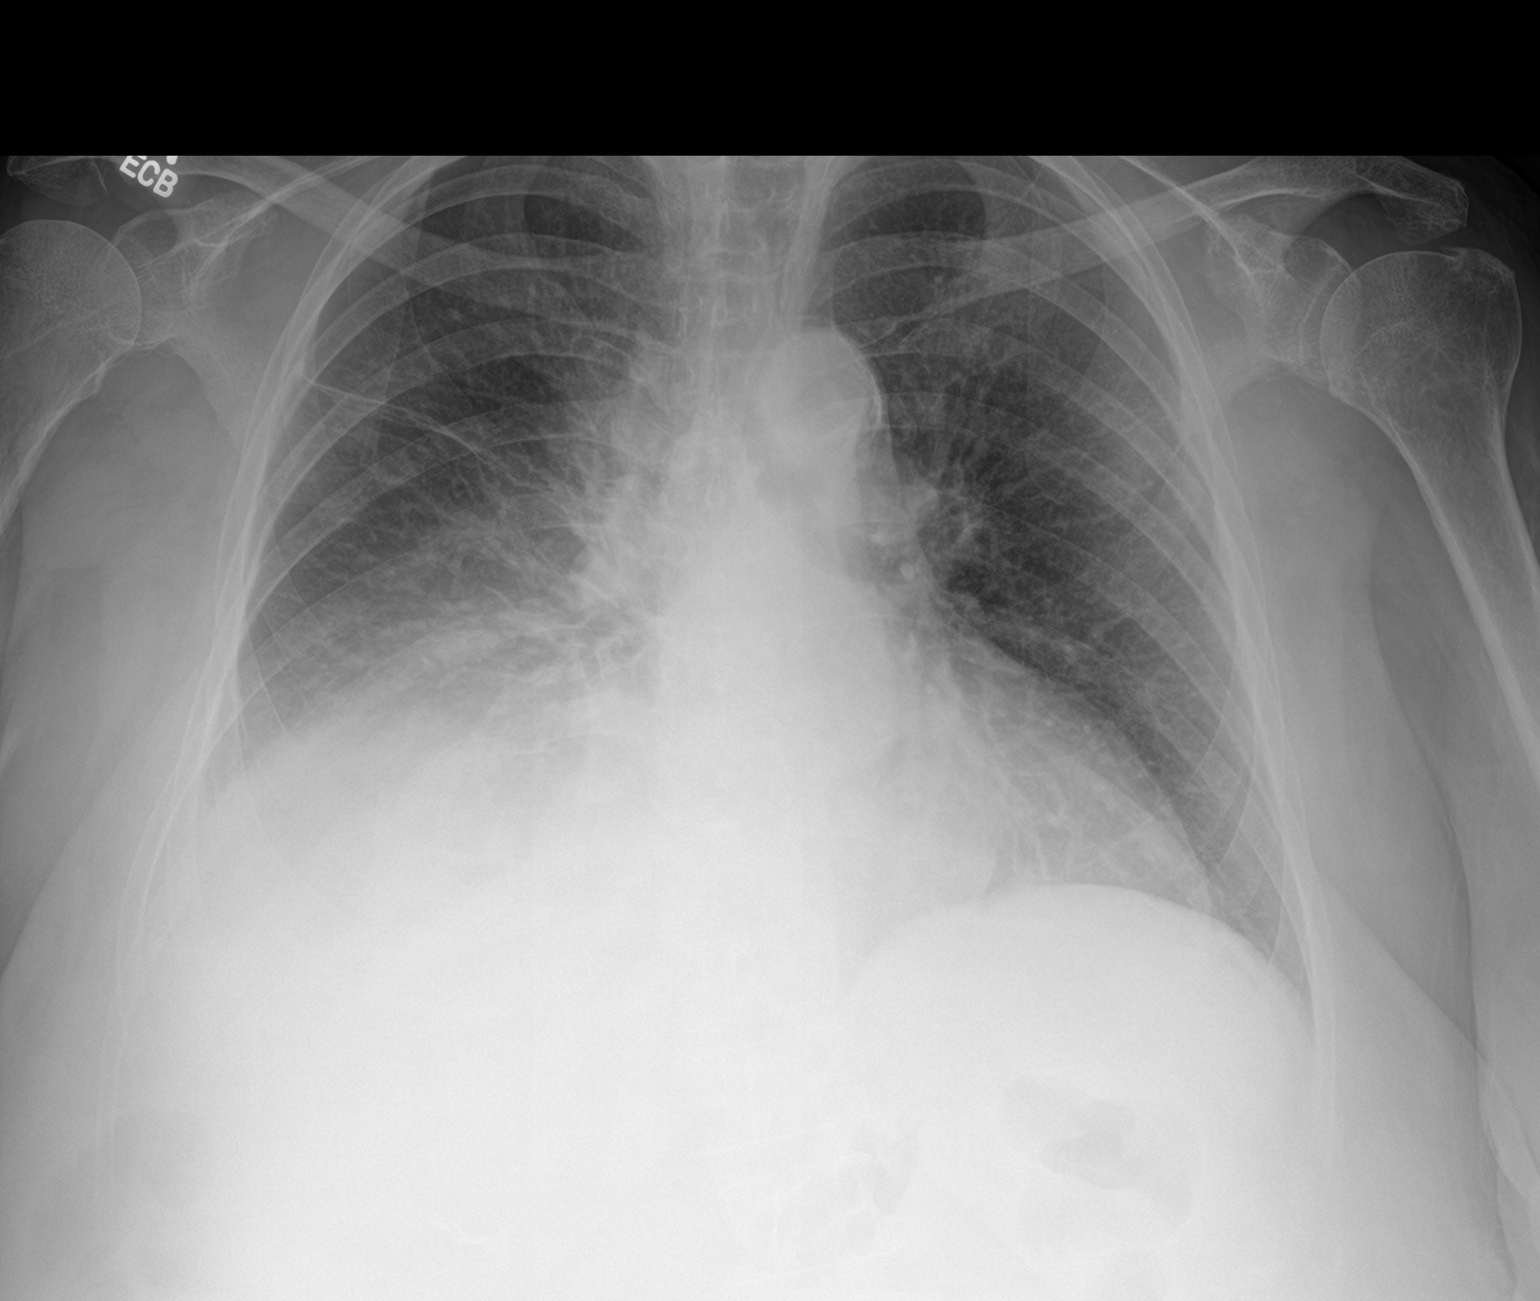

[2 of 2 positions shown; findings below may reference images not displayed]

FINDINGS: There is cardiomegaly with vascular congestion. There is a small
right pleural effusion and right lung base atelectasis, increased
since the prior radiograph. Pneumonia is not excluded clinical
correlation is recommended. No pneumothorax. Atherosclerotic
calcification of the aorta. No acute osseous pathology.
IMPRESSION: 1. Cardiomegaly with mild vascular congestion.
2. Small right pleural effusion and right lung base atelectasis,
increased since the prior radiograph.

## 2022-05-02 IMAGING — US US ABDOMEN COMPLETE
1 series · 14 of 25 positions shown · non-contrast
Comparison: 09/30/2020

CLINICAL DATA: Elevated liver function tests, history of
cholecystectomy

EXAM:
ABDOMEN ULTRASOUND COMPLETE

[Series 1: us abdomen complete · 14 of 73 slices shown]
[im 1/73]
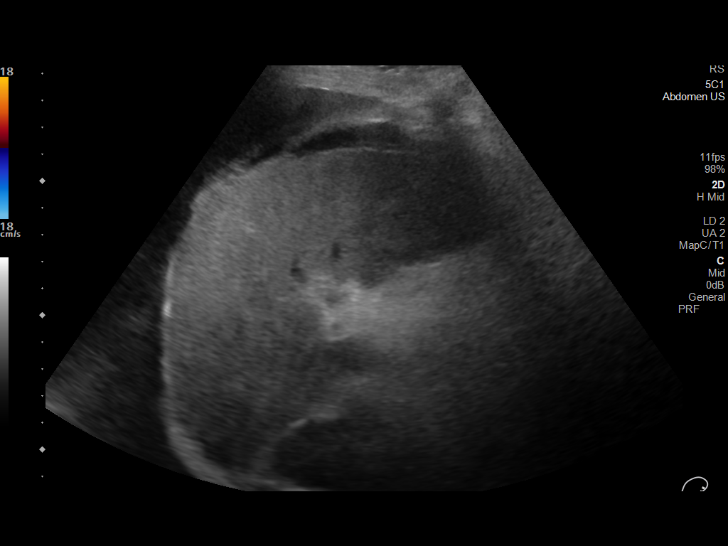
[im 7/73]
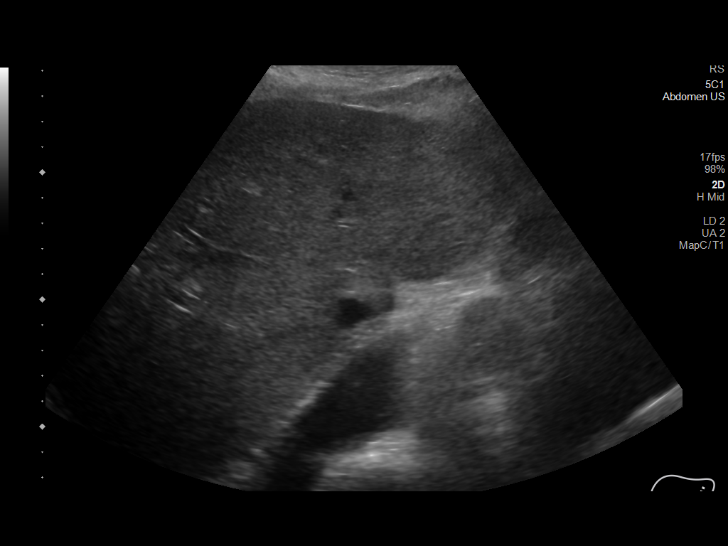
[im 13/73]
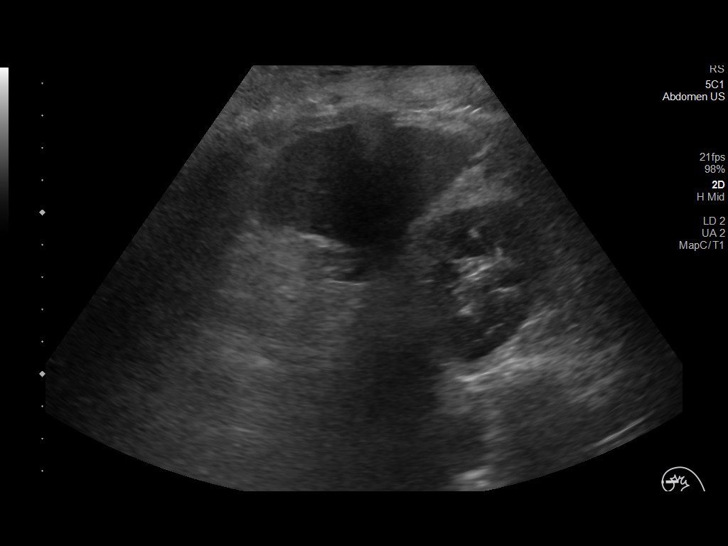
[im 19/73]
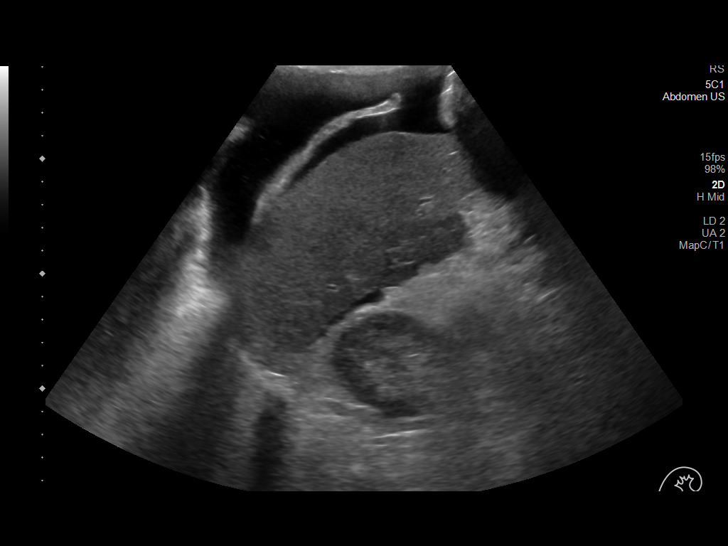
[im 25/73]
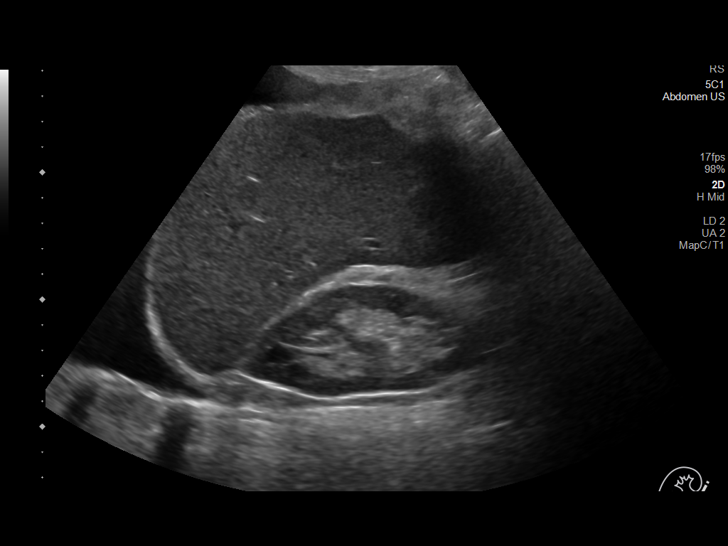
[im 28/73]
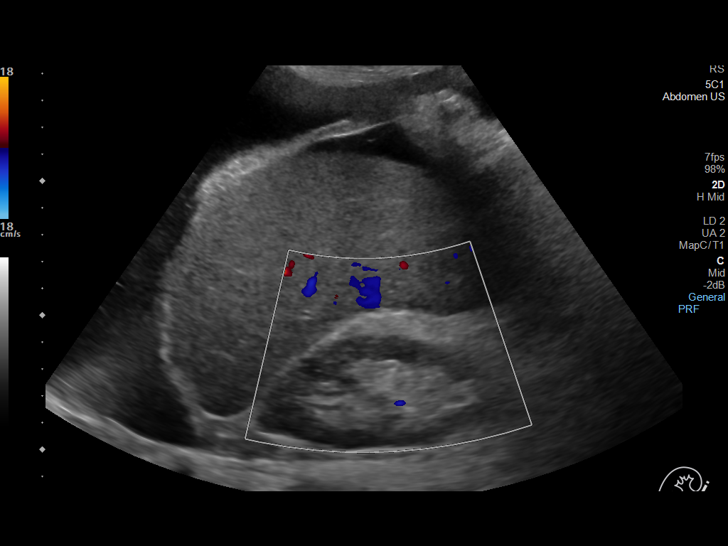
[im 34/73]
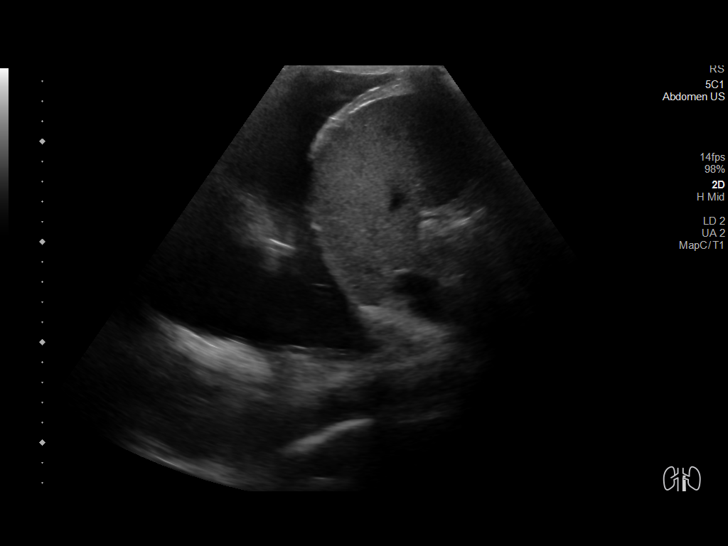
[im 40/73]
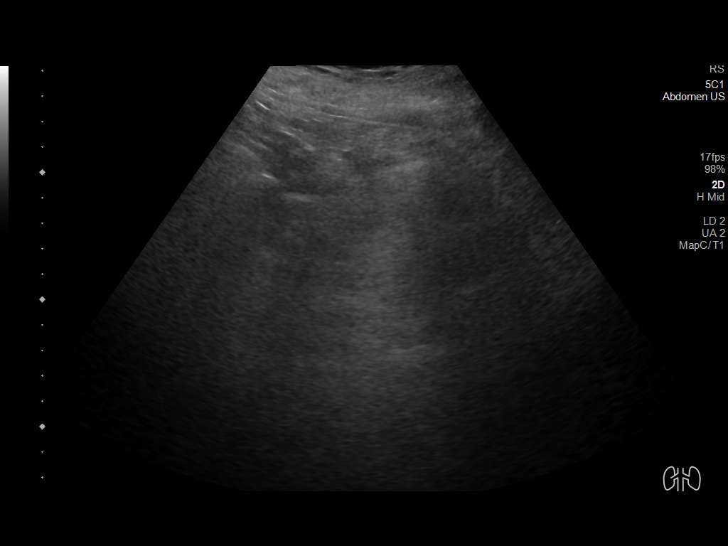
[im 46/73]
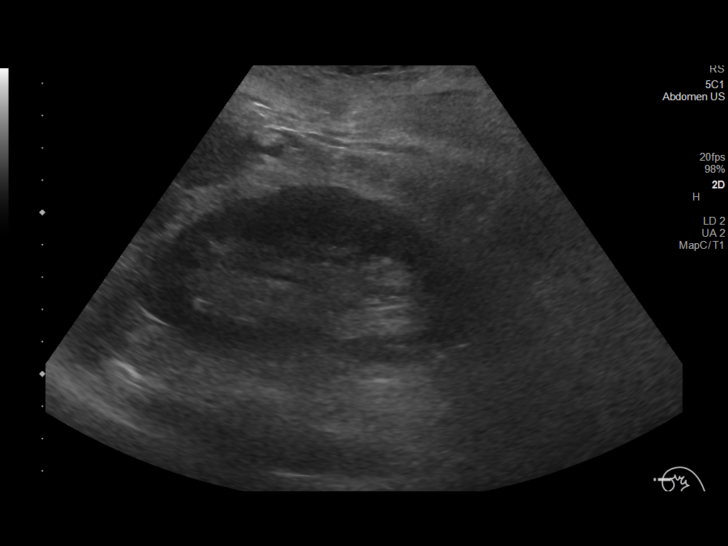
[im 49/73]
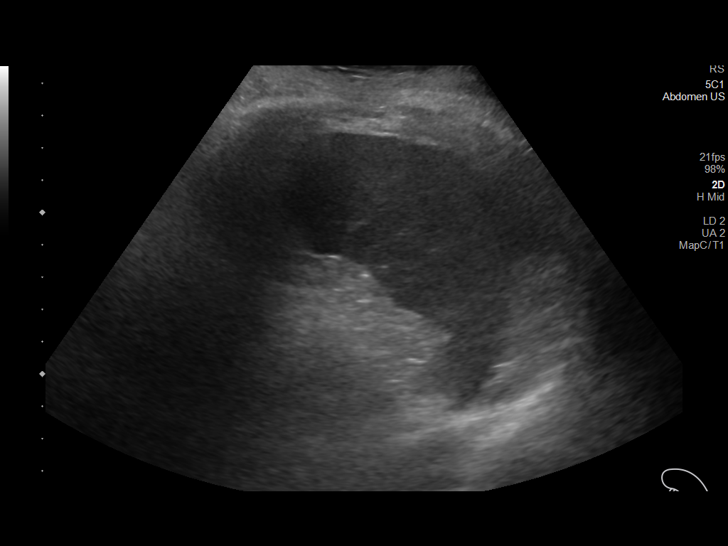
[im 55/73]
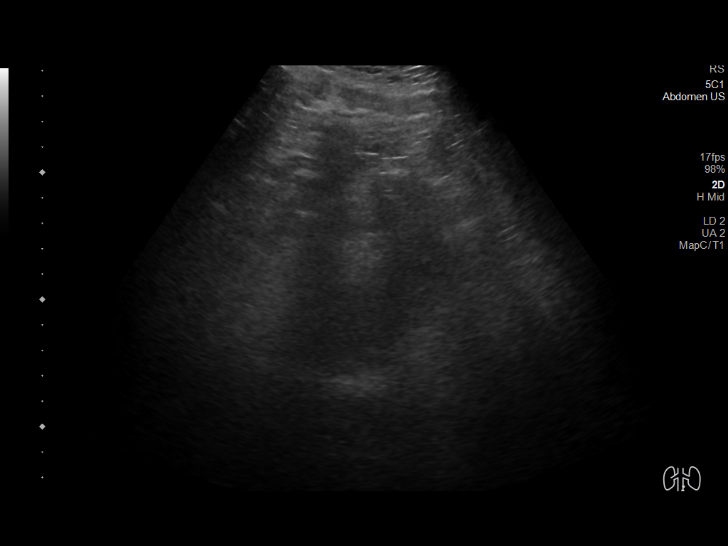
[im 61/73]
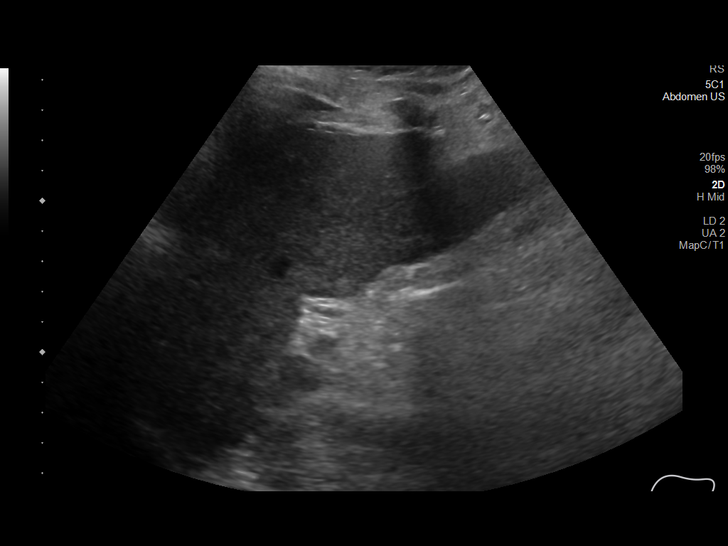
[im 67/73]
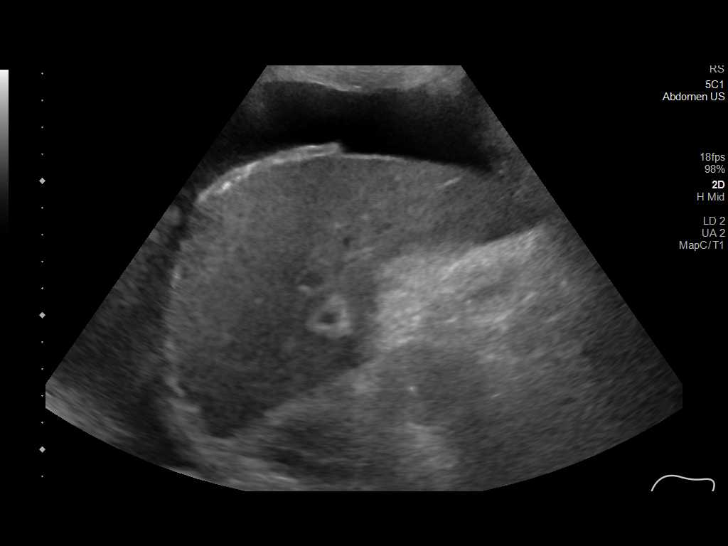
[im 73/73]
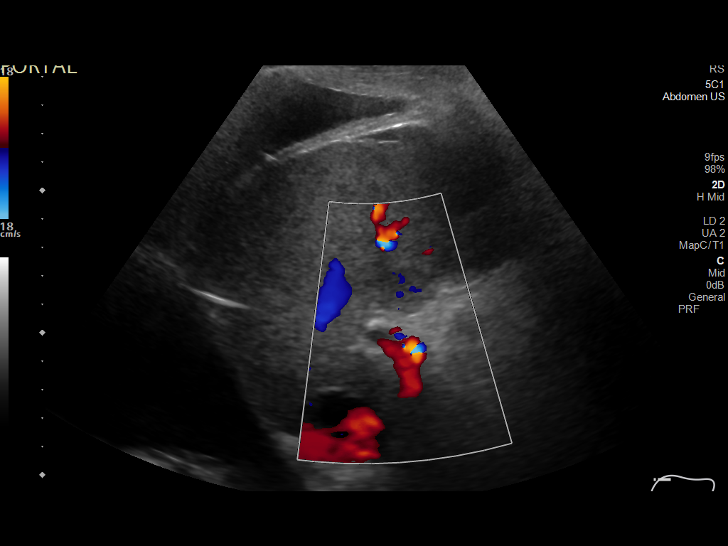

[14 of 25 positions shown; findings below may reference images not displayed]

FINDINGS: Gallbladder: Surgically absent

Common bile duct: Diameter: 4 mm

Liver: Heterogeneous increased liver echotexture without focal
abnormality. No biliary dilation. Portal vein is patent on color
Doppler imaging with normal direction of blood flow towards the
liver.

IVC: Not well visualized.

Pancreas: Visualized portion unremarkable.

Spleen: Size and appearance within normal limits.

Right Kidney: Length: 8.9 cm. Echogenicity within normal limits. No
mass or hydronephrosis visualized.

Left Kidney: Length: 10.3 cm. Echogenicity within normal limits. No
mass or hydronephrosis visualized.

Abdominal aorta: No aneurysm visualized.

Other findings: Trace ascites in the right upper quadrant. Bilateral
pleural effusions are incidentally noted.
IMPRESSION: 1. Heterogeneous increased echotexture of the liver without focal
abnormality, which could reflect mild hepatic steatosis or early
cirrhosis. No focal liver abnormality.
2. Trace right upper quadrant ascites.
3. Bilateral pleural effusions.

## 2022-05-03 IMAGING — XA IR PERC PLEURAL DRAIN W/INDWELL CATH W/IMG GUIDE
2 series · 3 of 3 positions shown · non-contrast
Comparison: none

INDICATION: 65-year-old female with a history right-sided pleural effusion.

[Series 1: fl (-) angio · 1 of 1 slices shown (1 of 2)]
[im 1/1]
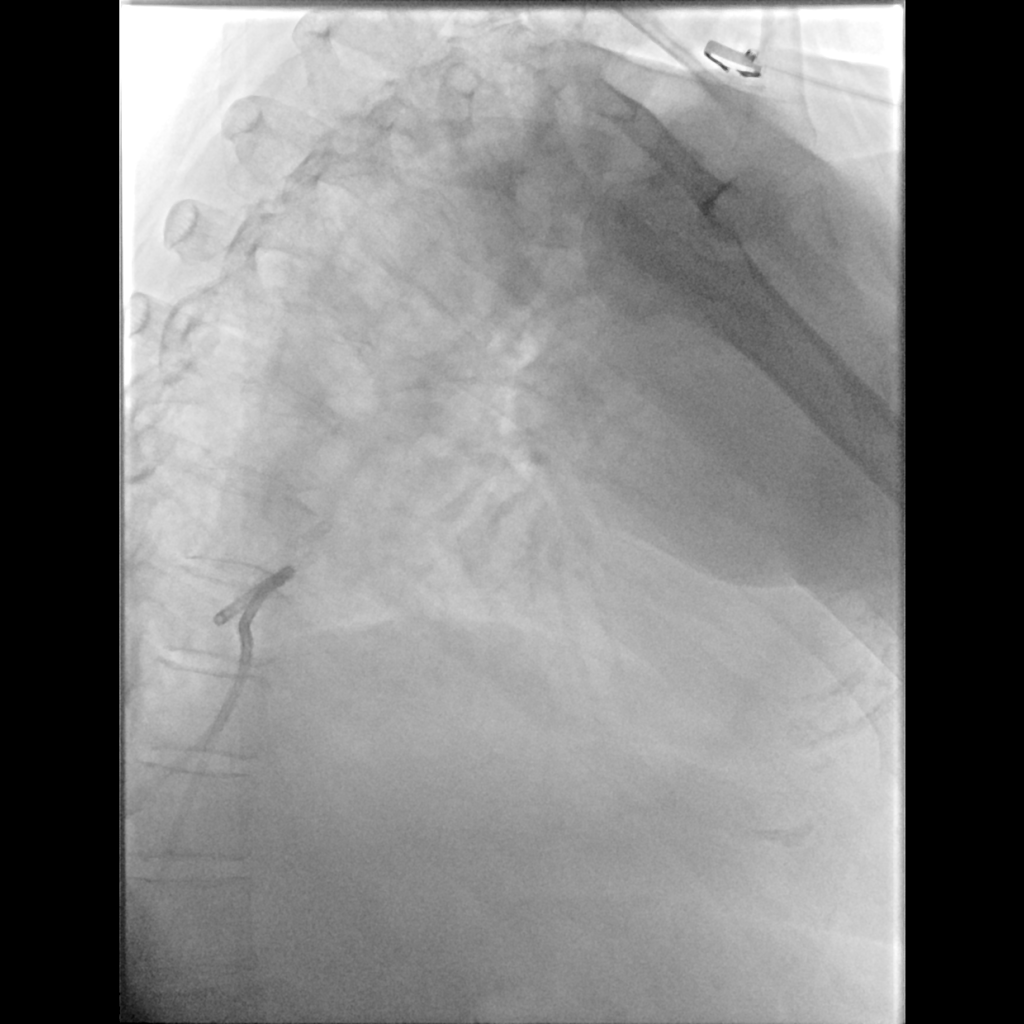

[Series 2: fl (-) angio · 2 of 2 slices shown (2 of 2)]
[im 1/2]
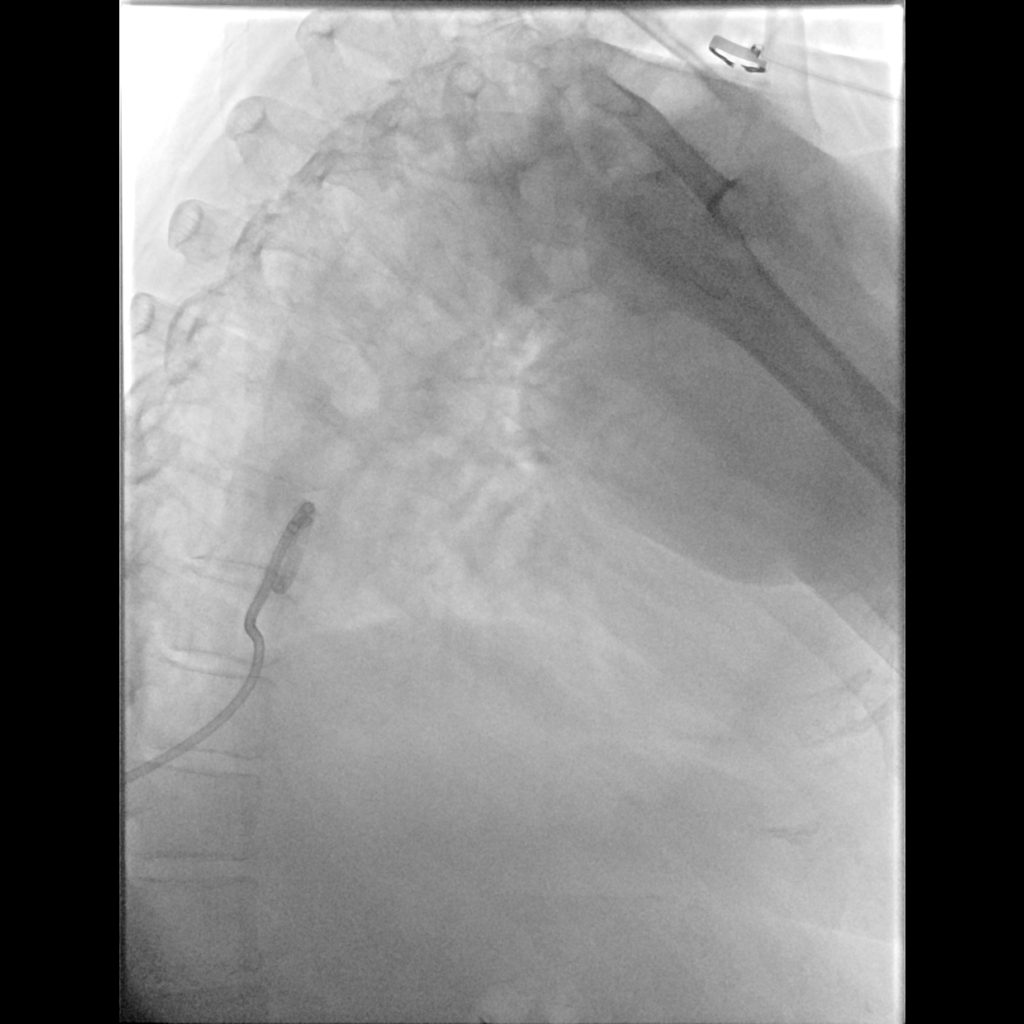
[im 2/2]
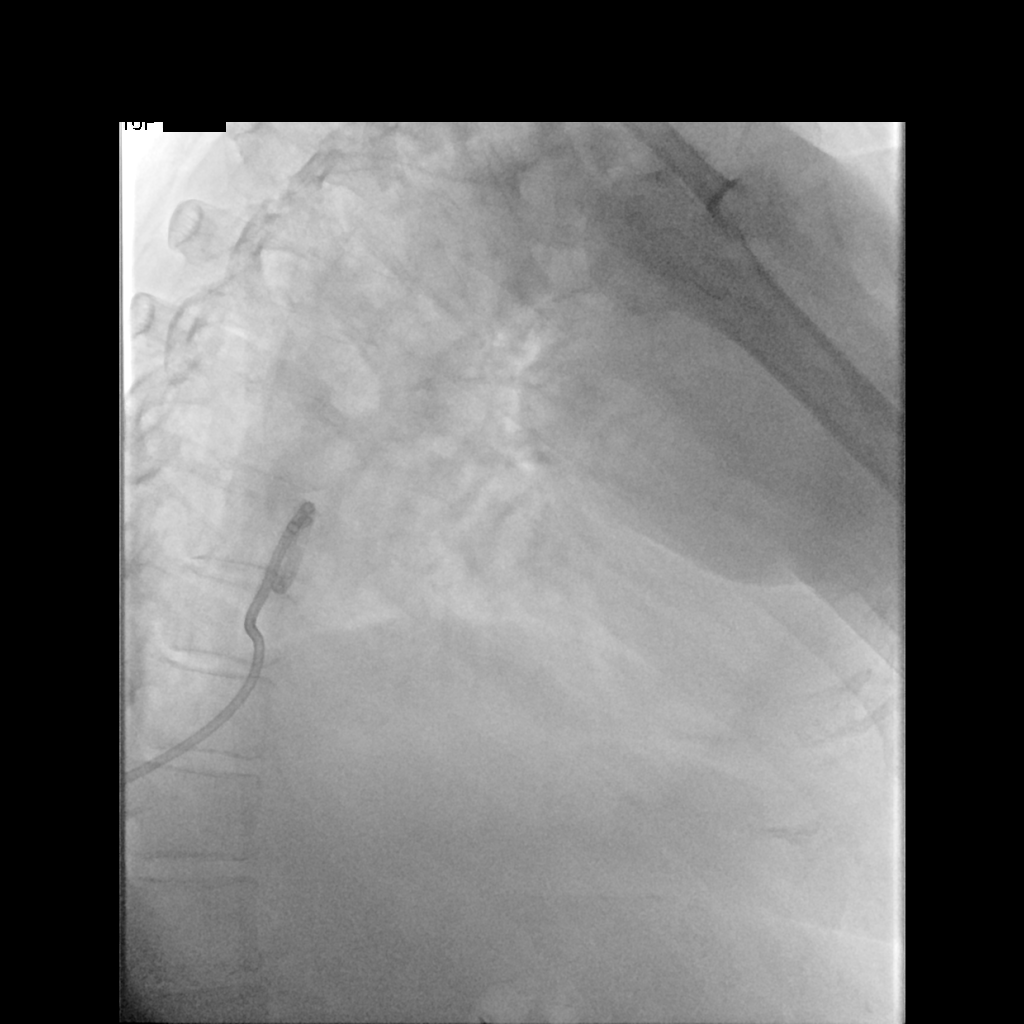

[3 of 3 positions shown; findings below may reference images not displayed]

EXAM:
IMAGE GUIDED PLACEMENT OF RIGHT-SIDED PLEURAL DRAIN

MEDICATIONS:
The patient is currently admitted to the hospital and receiving
intravenous antibiotics. The antibiotics were administered within an
appropriate time frame prior to the initiation of the procedure.

ANESTHESIA/SEDATION:
Fentanyl 25 mcg IV; Versed 0.5 mg IV

Moderate Sedation Time:  11 minutes

The patient was continuously monitored during the procedure by the
interventional radiology nurse under my direct supervision.

COMPLICATIONS:
None

PROCEDURE:
The procedure, risks, benefits, and alternatives were explained to
the patient/patient's family, who provided informed consent on the
patient's behalf. Specific risks that were addressed included
bleeding, infection, ongoing pneumothorax, need for further
procedure/surgery, chance of hemorrhage, hemoptysis, cardiopulmonary
collapse, death. Questions regarding the procedure were encouraged
and answered. The patient understands and consents to the procedure.

Patient was positioned in the right anterior oblique position on the
IR table and scout image of the chest was performed for planning
purposes.

The right mid axillary line at the level of the nipple was
identified, and prepped and draped in the usual sterile fashion. The
skin and subcutaneous tissues were generously infiltrated 1%
lidocaine for local anesthesia.

A trocar needle was then used to enter the pleural space using
ultrasound guidance. The inner trocar introducer was removed and an
035 guidewire was advanced to the apex of the lung under
fluoroscopy. Dilation of the skin tract was performed over the wire,
and then modified Seldinger technique was used to place a 10 French
pigtail catheter into the pleural space.

Catheter was attached to water seal chamber and suction was applied
confirming a operational chest tube.

Retention suture was placed.  Sterile dressing was placed.

Patient tolerated the procedure well and remained hemodynamically
stable throughout.

No complications were encountered and no significant blood loss was
encounter
IMPRESSION: Status post image guided right-sided pleural drainage catheter.
